# Patient Record
Sex: Female | Born: 1935 | Race: White | Hispanic: No | Marital: Married | Smoking: Former smoker
Health system: Southern US, Community
[De-identification: ages and names within clinical notes are randomized; demographics above are authoritative.]

## PROBLEM LIST (undated history)

## (undated) DIAGNOSIS — C50911 Malignant neoplasm of unspecified site of right female breast: Secondary | ICD-10-CM

## (undated) DIAGNOSIS — I10 Essential (primary) hypertension: Secondary | ICD-10-CM

## (undated) DIAGNOSIS — M549 Dorsalgia, unspecified: Secondary | ICD-10-CM

## (undated) DIAGNOSIS — C801 Malignant (primary) neoplasm, unspecified: Secondary | ICD-10-CM

## (undated) DIAGNOSIS — M1990 Unspecified osteoarthritis, unspecified site: Secondary | ICD-10-CM

## (undated) DIAGNOSIS — C50919 Malignant neoplasm of unspecified site of unspecified female breast: Secondary | ICD-10-CM

## (undated) DIAGNOSIS — G8929 Other chronic pain: Secondary | ICD-10-CM

## (undated) HISTORY — PX: JOINT REPLACEMENT: SHX530

## (undated) HISTORY — DX: Malignant (primary) neoplasm, unspecified: C80.1

## (undated) HISTORY — PX: EYE SURGERY: SHX253

## (undated) HISTORY — PX: SHOULDER SURGERY: SHX246

## (undated) HISTORY — DX: Malignant neoplasm of unspecified site of right female breast: C50.911

## (undated) HISTORY — DX: Malignant neoplasm of unspecified site of unspecified female breast: C50.919

## (undated) HISTORY — DX: Unspecified osteoarthritis, unspecified site: M19.90

## (undated) HISTORY — PX: COLONOSCOPY: SHX174

---

## 1972-04-13 HISTORY — PX: ABDOMINAL HYSTERECTOMY: SHX81

## 1987-04-14 DIAGNOSIS — Z853 Personal history of malignant neoplasm of breast: Secondary | ICD-10-CM | POA: Insufficient documentation

## 1987-04-14 DIAGNOSIS — C801 Malignant (primary) neoplasm, unspecified: Secondary | ICD-10-CM

## 1987-04-14 HISTORY — DX: Malignant (primary) neoplasm, unspecified: C80.1

## 1987-04-14 HISTORY — PX: BREAST SURGERY: SHX581

## 1999-03-17 ENCOUNTER — Encounter: Admission: RE | Admit: 1999-03-17 | Discharge: 1999-03-17 | Payer: Self-pay | Admitting: Family Medicine

## 1999-03-17 ENCOUNTER — Encounter: Payer: Self-pay | Admitting: Family Medicine

## 1999-03-24 ENCOUNTER — Encounter: Admission: RE | Admit: 1999-03-24 | Discharge: 1999-03-24 | Payer: Self-pay | Admitting: Family Medicine

## 1999-03-24 ENCOUNTER — Encounter: Payer: Self-pay | Admitting: Family Medicine

## 1999-09-16 ENCOUNTER — Encounter: Payer: Self-pay | Admitting: Family Medicine

## 1999-09-16 ENCOUNTER — Encounter: Admission: RE | Admit: 1999-09-16 | Discharge: 1999-09-16 | Payer: Self-pay | Admitting: Family Medicine

## 1999-12-02 ENCOUNTER — Ambulatory Visit (HOSPITAL_COMMUNITY): Admission: RE | Admit: 1999-12-02 | Discharge: 1999-12-02 | Payer: Self-pay | Admitting: Gastroenterology

## 1999-12-05 ENCOUNTER — Encounter: Admission: RE | Admit: 1999-12-05 | Discharge: 1999-12-05 | Payer: Self-pay | Admitting: Orthopedic Surgery

## 1999-12-05 ENCOUNTER — Encounter: Payer: Self-pay | Admitting: Orthopedic Surgery

## 1999-12-08 ENCOUNTER — Ambulatory Visit (HOSPITAL_BASED_OUTPATIENT_CLINIC_OR_DEPARTMENT_OTHER): Admission: RE | Admit: 1999-12-08 | Discharge: 1999-12-09 | Payer: Self-pay | Admitting: Orthopedic Surgery

## 2000-04-19 ENCOUNTER — Encounter: Admission: RE | Admit: 2000-04-19 | Discharge: 2000-04-19 | Payer: Self-pay | Admitting: Family Medicine

## 2000-04-19 ENCOUNTER — Encounter: Payer: Self-pay | Admitting: Family Medicine

## 2000-12-23 ENCOUNTER — Encounter: Payer: Self-pay | Admitting: Family Medicine

## 2000-12-23 ENCOUNTER — Encounter: Admission: RE | Admit: 2000-12-23 | Discharge: 2000-12-23 | Payer: Self-pay | Admitting: Family Medicine

## 2000-12-31 ENCOUNTER — Encounter: Admission: RE | Admit: 2000-12-31 | Discharge: 2000-12-31 | Payer: Self-pay | Admitting: Family Medicine

## 2000-12-31 ENCOUNTER — Encounter: Payer: Self-pay | Admitting: Family Medicine

## 2001-01-26 ENCOUNTER — Ambulatory Visit (HOSPITAL_COMMUNITY): Admission: RE | Admit: 2001-01-26 | Discharge: 2001-01-26 | Payer: Self-pay | Admitting: Neurosurgery

## 2001-01-26 ENCOUNTER — Encounter: Payer: Self-pay | Admitting: Neurosurgery

## 2001-02-15 ENCOUNTER — Encounter: Admission: RE | Admit: 2001-02-15 | Discharge: 2001-02-15 | Payer: Self-pay | Admitting: Neurosurgery

## 2001-02-15 ENCOUNTER — Encounter: Payer: Self-pay | Admitting: Neurosurgery

## 2001-03-01 ENCOUNTER — Encounter: Payer: Self-pay | Admitting: Neurosurgery

## 2001-03-01 ENCOUNTER — Encounter: Admission: RE | Admit: 2001-03-01 | Discharge: 2001-03-01 | Payer: Self-pay | Admitting: Neurosurgery

## 2001-03-15 ENCOUNTER — Encounter: Admission: RE | Admit: 2001-03-15 | Discharge: 2001-03-15 | Payer: Self-pay | Admitting: Neurosurgery

## 2001-03-15 ENCOUNTER — Encounter: Payer: Self-pay | Admitting: Neurosurgery

## 2001-07-12 ENCOUNTER — Encounter: Payer: Self-pay | Admitting: Family Medicine

## 2001-07-12 ENCOUNTER — Encounter: Admission: RE | Admit: 2001-07-12 | Discharge: 2001-07-12 | Payer: Self-pay | Admitting: Family Medicine

## 2001-07-22 ENCOUNTER — Encounter: Payer: Self-pay | Admitting: Family Medicine

## 2001-07-22 ENCOUNTER — Encounter: Admission: RE | Admit: 2001-07-22 | Discharge: 2001-07-22 | Payer: Self-pay | Admitting: Family Medicine

## 2002-07-20 ENCOUNTER — Encounter: Payer: Self-pay | Admitting: Family Medicine

## 2002-07-20 ENCOUNTER — Encounter: Admission: RE | Admit: 2002-07-20 | Discharge: 2002-07-20 | Payer: Self-pay | Admitting: Family Medicine

## 2002-11-14 ENCOUNTER — Ambulatory Visit (HOSPITAL_COMMUNITY): Admission: RE | Admit: 2002-11-14 | Discharge: 2002-11-14 | Payer: Self-pay | Admitting: Ophthalmology

## 2002-11-14 ENCOUNTER — Encounter: Payer: Self-pay | Admitting: Ophthalmology

## 2003-07-24 ENCOUNTER — Encounter: Admission: RE | Admit: 2003-07-24 | Discharge: 2003-07-24 | Payer: Self-pay | Admitting: Family Medicine

## 2003-11-14 ENCOUNTER — Inpatient Hospital Stay (HOSPITAL_COMMUNITY): Admission: RE | Admit: 2003-11-14 | Discharge: 2003-11-19 | Payer: Self-pay | Admitting: Orthopedic Surgery

## 2004-08-07 ENCOUNTER — Encounter: Admission: RE | Admit: 2004-08-07 | Discharge: 2004-08-07 | Payer: Self-pay | Admitting: Family Medicine

## 2004-10-27 ENCOUNTER — Inpatient Hospital Stay (HOSPITAL_COMMUNITY): Admission: RE | Admit: 2004-10-27 | Discharge: 2004-10-30 | Payer: Self-pay | Admitting: Orthopedic Surgery

## 2005-09-10 ENCOUNTER — Encounter: Admission: RE | Admit: 2005-09-10 | Discharge: 2005-09-10 | Payer: Self-pay | Admitting: Orthopedic Surgery

## 2006-01-11 ENCOUNTER — Encounter: Admission: RE | Admit: 2006-01-11 | Discharge: 2006-01-11 | Payer: Self-pay | Admitting: Family Medicine

## 2006-03-18 ENCOUNTER — Encounter: Admission: RE | Admit: 2006-03-18 | Discharge: 2006-03-18 | Payer: Self-pay | Admitting: Family Medicine

## 2006-10-08 ENCOUNTER — Encounter: Admission: RE | Admit: 2006-10-08 | Discharge: 2006-10-08 | Payer: Self-pay | Admitting: Family Medicine

## 2007-08-02 ENCOUNTER — Emergency Department (HOSPITAL_COMMUNITY): Admission: EM | Admit: 2007-08-02 | Discharge: 2007-08-02 | Payer: Self-pay | Admitting: Emergency Medicine

## 2007-10-10 ENCOUNTER — Encounter: Admission: RE | Admit: 2007-10-10 | Discharge: 2007-10-10 | Payer: Self-pay | Admitting: Family Medicine

## 2008-09-21 ENCOUNTER — Encounter: Admission: RE | Admit: 2008-09-21 | Discharge: 2008-09-21 | Payer: Self-pay | Admitting: Gastroenterology

## 2008-10-04 ENCOUNTER — Encounter: Payer: Self-pay | Admitting: Gastroenterology

## 2008-10-04 ENCOUNTER — Ambulatory Visit (HOSPITAL_COMMUNITY): Admission: RE | Admit: 2008-10-04 | Discharge: 2008-10-04 | Payer: Self-pay | Admitting: Gastroenterology

## 2008-10-10 ENCOUNTER — Encounter: Admission: RE | Admit: 2008-10-10 | Discharge: 2008-10-10 | Payer: Self-pay | Admitting: Family Medicine

## 2008-10-16 ENCOUNTER — Encounter: Admission: RE | Admit: 2008-10-16 | Discharge: 2008-10-16 | Payer: Self-pay | Admitting: Family Medicine

## 2009-01-11 ENCOUNTER — Encounter: Admission: RE | Admit: 2009-01-11 | Discharge: 2009-01-11 | Payer: Self-pay | Admitting: Family Medicine

## 2009-01-16 ENCOUNTER — Encounter (INDEPENDENT_AMBULATORY_CARE_PROVIDER_SITE_OTHER): Payer: Self-pay | Admitting: Family Medicine

## 2009-01-16 ENCOUNTER — Encounter: Admission: RE | Admit: 2009-01-16 | Discharge: 2009-01-16 | Payer: Self-pay | Admitting: Family Medicine

## 2009-10-11 ENCOUNTER — Encounter: Admission: RE | Admit: 2009-10-11 | Discharge: 2009-10-11 | Payer: Self-pay | Admitting: Family Medicine

## 2010-05-05 ENCOUNTER — Encounter: Payer: Self-pay | Admitting: Gastroenterology

## 2010-06-18 ENCOUNTER — Ambulatory Visit
Admission: RE | Admit: 2010-06-18 | Discharge: 2010-06-18 | Disposition: A | Discharge status: Home / Self Care | Payer: Medicare Other | Source: Ambulatory Visit | Attending: Family Medicine | Admitting: Family Medicine

## 2010-06-18 ENCOUNTER — Other Ambulatory Visit: Payer: Self-pay | Admitting: Family Medicine

## 2010-06-18 DIAGNOSIS — S0990XA Unspecified injury of head, initial encounter: Secondary | ICD-10-CM

## 2010-08-29 NOTE — Op Note (Signed)
[place]. [facility]  Patient:    [name], [name]                      MRN: [id number] Proc. Date: 12/08/99 Adm. Date:  [id number] Attending:  Twana First                           Operative Report  PREOPERATIVE DIAGNOSIS: Right shoulder rotator cuff tear.  POSTOPERATIVE DIAGNOSES:  1. Right shoulder rotator cuff tear.  2. Right shoulder partial labrum tear with impingement.  OPERATION/PROCEDURE:  1. Right shoulder examined under anesthesia followed by arthroscopic     partial labrum tear debridement.  2. Right shoulder open rotator cuff repair using Arthrotec suture anchor.  3. Right shoulder subacromial decompression.  SURGEON: Elana Alm. Thurston Hole, M.D.  ASSISTANT: Kirstin Adelberger, P.A.  ANESTHESIA: General.  OPERATIVE TIME: Operative time was 45 minutes.  COMPLICATIONS: None.  INDICATIONS FOR PROCEDURE: [name] is a 77 year old woman who has had six to eight months of increasing right shoulder pain with signs and symptoms and MRI documenting a rotator cuff tear, who has failed conservative care and is now to undergo arthroscopy and rotator cuff repair.  DESCRIPTION OF PROCEDURE: [name] was brought to the operating room on December 08, 1999 after supraclavicular block had been placed in the holding room.  The patient was placed on the operating table in the supine position and after being placed under general anesthesia her right shoulder was examined under anesthesia.  She had full range of motion.  Her shoulder was stable to ligamentous examination.  She was placed in a beach chair position and her shoulder and arm prepped using sterile Betadine and draped using sterile technique.  Originally through a posterior arthroscopic portal the arthroscope with pump attached was placed and through an anterior portal an arthroscopic probe was placed.  On initial inspection the articular cartilage in the glenohumeral joint was normal.  The  anterior labrum had partial tearing, 20%, and this was debrided.  The superior labrum and biceps tendon anchor was intact.  The biceps tendon was intact.  The inferior labrum and anterior and inferior glenohumeral ligaments were intact.  The posterior labrum was intact.  The rotator cuff was thoroughly inspected with a complete tear of the supraspinatus noted and partial tear of the infraspinatus.  This was partially debrided arthroscopically.  The subacromial space was entered through a lateral arthroscopic portal and subacromial decompression carried out as well CA ligament release.  After this was done and through this lateral portal a 3-4 cm deltoid splitting approach was made.  The underlying subcutaneous tissues were incised in line with the skin incision and the subacromial space entered.  The rotator cuff tear was easily visualized and further debrided sharply back to healthy edges.  An Arthrotec suture anchor was placed in the greater tuberosity trough area and then each of these sutures were placed in mattress suture technique through the rotator cuff and tied down, further reinforced also with #2 Panocryl tied through a small drill hole in the bone, securing the rotator cuff back down to the greater tuberosity.  After this was done the shoulder could be brought through full range of motion with no impingement on the rotator cuff repair noted.  The wound was irrigated and closed using 0 Vicryl, 2-0 Vicryl, and 3-0 Prolene in the subcuticular layers.  Steri-Strips were applied.  Sterile dressings were applied and a  sling.  The patient was awakened and taken to the recovery room in stable condition.  FOLLOW-UP CARE: [name] will be followed overnight at the recovery care center for IV pain control, neurovascular monitoring, and will be discharged tomorrow on Percocet.  She will see me back in the office in a week for suture removal and follow-up. DD:  12/08/99 TD:   12/08/99 Job: [id number]

## 2010-08-29 NOTE — Discharge Summary (Signed)
[name], [name]                         ACCOUNT NO.:  [id number]   MEDICAL RECORD NO.:  [id number]                   PATIENT TYPE:  INP   LOCATION:  5003                                 FACILITY:  [facility]   PHYSICIAN:  Julien Girt, P.A.            DATE OF BIRTH:  [birth year]   DATE OF ADMISSION:  11/14/2003  DATE OF DISCHARGE:  11/19/2003                                 DISCHARGE SUMMARY   ADMISSION DIAGNOSES:  1.  End stage degenerative joint disease, left knee.  2.  History of breast cancer.  3.  History of reflux.   DISCHARGE DIAGNOSES:  1.  End stage degenerative joint disease, left knee.  2.  History of breast cancer.  3.  History of reflux.  4.  Postop blood loss anemia.   HISTORY OF PRESENT ILLNESS:  The patient is a 77 year old white female who  has bilateral end stage DJD of her knees.  Her left one is more painful than  her right.  She has tried conservative care including anti-inflammatories,  pain medications without success.  She has pain at night, pain at rest, pain  unrelieved by anti-inflammatories.  She understands the risks, benefits and  possible complications of the left total knee replacement and is without  question.   PROCEDURES:  On November 14, 2003, the patient underwent a left total knee  replacement by Dr. Thurston Hole.  She tolerated the procedure well.  She had a  femoral nerve block postoperatively.  On postop day #1, her vital signs were  stable.  Her hemoglobin was 9.9.  Her glucose was 160.  She had no other  complaints.  She was up with physical therapy to a chair.  Postop day #2,  patient doing well with pain control.  Temperature 98.8, sodium 128.  Surgical wound was well approximated.  Her hemoglobin as 8.7.  IV was  heparin block.  Her Foley was discontinued.  Her dressing was changed.  Postop day #3, the patient was afebrile.  O2 saturations were 96.  Her  hemoglobin was 8.4.  Her sodium had normalized to 134.  She was  metabolically  stable.  Postop day #4, T-max of 101.  Hemoglobin 8.6.  She  was metabolically stable.  Her INR was 1.7.  She does have some tenderness  at her left calf.  Doppler studies showed no signs of DVT.  She was able to  ambulate 7 steps with physical therapy, but did progress slowly.  Postop day  #5, the patient felt significantly better.  She did have significant  ecchymosis of her knee but minimal drainage.  Surgical wound was well  approximated.   DISPOSITION:  She was discharged to home in stable condition.   DISCHARGE INSTRUCTIONS:  Activity:  Weightbearing as tolerated.  Diet:  Regular.  To use a CPM 0-60 degrees 8 hours a day, increased by 5-10 degrees  a day.  To work on an  elevated left heel on a folded pillow.  To work on  passive extension for 30 minutes a day.  To keep wound clean and dry.  To do  daily dressing changes.  Call with increased temperature, increased redness,  increased drainage or temperature greater than 100.1.   DISCHARGE MEDICATIONS:  1.  Percocet 5/325 1-2 q.4-6h. p.r.n. pain.  2.  Coumadin 4 mg one p.o. q. day.  3.  Evista 60 mg one p.o. daily.  4.  Nexium 40 mg one p.o. daily.  5.  Oxybutynin patch 3.9 mg twice a week.  6.  Colace 100 mg one tablet b.i.d.  7.  Senokot S 2 tablets before dinner.   FOLLOWUP:  She is to follow up with Dr. Thurston Hole on November 27, 2003.                                                Julien Girt, P.A.    KS/MEDQ  D:  12/12/2003  T:  12/12/2003  Job:  [id number]

## 2010-08-29 NOTE — Op Note (Signed)
NAMEGENNY, CAULDER               ACCOUNT NO.:  [id number]   MEDICAL RECORD NO.:  [id number]          PATIENT TYPE:  INP   LOCATION:  2550                         FACILITY:  [facility]   PHYSICIAN:  Robert A. Thurston Hole, M.D. DATE OF BIRTH:  [birth year]   DATE OF PROCEDURE:  10/27/2004  DATE OF DISCHARGE:                                 OPERATIVE REPORT   PREOPERATIVE DIAGNOSIS:  Right knee degenerative joint disease.   POSTOPERATIVE DIAGNOSIS:  Right knee degenerative joint disease.   OPERATION PERFORMED:  Right total knee replacement using Depuy cemented  total knee system with a #3 cemented femur, #3 cemented tibia with 10 mm  polyethylene RP tibial spacer and 38 mm polyethylene cemented patella.   SURGEON:  Elana Alm. Thurston Hole, M.D.   ASSISTANT:  Julien Girt, P.A.   ANESTHESIA:  General.   OPERATIVE TIME:  One hour and 30 minutes.   COMPLICATIONS:  None.   DESCRIPTION OF PROCEDURE:  [name] was brought to the operating room on  October 27, 2004 and placed on the operating table in supine position after a  femoral nerve block had been placed in the holding room by anesthesia for  postoperative pain control.  After being placed under general anesthesia,  she had a Foley catheter placed under sterile conditions and received Ancef  1 g IV preoperatively for prophylaxis.  Her right knee was examined.  She  had range of motion from -7 to 125 degrees with mild varus deformity. Knee  stable to ligamentous exam.  The right leg was prepped using sterile  DuraPrep and draped using sterile technique. The leg was exsanguinated and a  tourniquet elevated 375 mm.  Initially, through a 15 cm longitudinal  incision based over the patella, initial exposure was made.  The underlying  subcutaneous tissues were incised in line with the skin incision.  A median  arthrotomy was performed revealing an excessive amount of normal-appearing  joint fluid.  The articular surfaces were inspected.  She had  grade 4  changes medially, grade 3 changes laterally and grade 3 and 4 changes in the  patellofemoral joint.  The medial and lateral meniscal remnants were removed  as well as the anterior cruciate ligament.  Osteophytes were removed off the  femoral condyles and tibial plateau.  Intramedullary drill was then drilled  up the femoral canal for placement of the distal femoral cutting jig which  was placed in the appropriate amount of rotation and the distal 11 mm cut  was made.  The distal femur was incised.  A  #3 was found to be the  appropriate size.  A #3 cutting jig was placed and then these cuts were  made.  After this was done, the proximal tibia was exposed. The tibial  spines were removed with an oscillating saw.  Intramedullary drill drilled  down the tibial canal for placement of the proximal tibial cutting jig which  was placed in the appropriate amount of rotation and a proximal 8 mm cut was  made off the medial or lower side.  The tibial cut surface was sized.  #  3  was found to be the appropriate size for the tibial baseplate and then  spacer blocks were placed both in flexion and extension with the 10 mm block  and found to give excellent tension in both flexion and extension.  After  this was done, then the keel cut was made for the tibia and the #3 tibial  trial was placed.  The PCL block cutter was then placed on the distal femur  and these cuts were made.  At this point the #3 femoral trial was placed and  with a 10 mm polyethylene RP tibial spacer on the #3 tibial base plate.  The  knee was taken through a full range of motion and found to be stable.  Leg  lengths were equal with excellent correction of her varus deformity and her  flexion deformity.  After this was done, the patella was sized.  The depth  of the patella was 25 mm and a 10 mm resurfacing cut was made and three  locking holes were placed for a 38 mm patella.  The trial was placed.  The  patellofemoral  tracking was evaluated and this was found to be normal.  At  this point it was felt that all the trial components were of excellent size,  fit and stability.  They were then removed.  The knee was then jet lavage  irrigated with 3L of saline solution and then the proximal tibia was exposed  and the #3 tibial baseplate was hammered into position with cement backing  with excess cement being removed from around the edges.  The #3 femoral  component with cement backing was hammered in position also with an  excellent fit with excess cement being removed from around the edges.  The  10 mm polyethylene RP tibial spacer was then placed on the tibial baseplate.  Knee taken through range of motion from 0 to 120 degrees with excellent  stability and excellent correction of her varus and flexion deformity.  The  38 mm polyethylene cement backed patella was then placed into its position  and then held there with a clamp.  After the cement hardened, the  patellofemoral tracking was again evaluated and this was found to be normal.  At this point, it was felt that all of the components were of excellent  size, fit and stability.  The knee was then jet lavage irrigated further  with saline.  Tourniquet was released.  Hemostasis was obtained with  cautery.  The arthrotomy was then closed with #1 Ethibond suture over two  medium Hemovac drains.  Subcutaneous tissues closed with 0 and 2-0 Vicryl.  Skin closed with skin staples.  Sterile dressings were applied.  Hemovac  injected with 0.25% Marcaine with epinephrine.  Sterile dressings and a long  leg splint applied and then the patient awakened and taken to recovery room  in stable condition.  Sponge and needle counts were correct times two at the  end of this case.       RAW/MEDQ  D:  10/27/2004  T:  10/27/2004  Job:  [id number]

## 2010-08-29 NOTE — Discharge Summary (Signed)
NAMEJOSS, [name]               ACCOUNT NO.:  [id number]   MEDICAL RECORD NO.:  [id number]          PATIENT TYPE:  INP   LOCATION:  5006                         FACILITY:  [facility]   PHYSICIAN:  Robert A. Thurston Hole, M.D. DATE OF BIRTH:  January 26, 1926   DATE OF ADMISSION:  10/27/2004  DATE OF DISCHARGE:  10/30/2004                                 DISCHARGE SUMMARY   ADMISSION DIAGNOSES:  1.  End-stage degenerative joint disease, right knee.  2.  History of breast cancer.  3.  Gastroesophageal reflux disease.   DISCHARGE DIAGNOSES:  1.  End-stage degenerative joint disease, right knee.  2.  History of breast cancer.  3.  Gastroesophageal reflux disease.  4.  Postoperative blood loss anemia.   HISTORY OF PRESENT ILLNESS:  The patient is a 77 year old white female who  has had bilateral end-stage DJD of her knees.  She had a left total knee  replacement in the past and tolerated that procedure well.  Now she  understands risks, benefits, and possible complications of a right total  knee replacement and is without questions.   PROCEDURES IN HOUSE:  On October 27, 2004, the patient had a right total knee  by Dr. Thurston Hole.  She tolerated the procedure well, had a femoral nerve block  postoperatively and was admitted for pain control, deep vein thrombosis  prophylaxis, and physical therapy.   HOSPITAL COURSE:  On postop day #1, the patient was doing well.  She had no  complaints. She was afebrile.  Hemoglobin was 9.9.  She was hemodynamically  stable.  INR was 1.2.  Surgical wound was well approximated with moderate  drainage.  PCA was discontinued.  The drain was discontinued.  Her dressing  was changed.  She was started on physical therapy.  She does not tolerate  PERCOCET, so she was started on Viocin for pain with PCA being discontinued.   On postop day #2, the patient was once again vital signs stable.  Her T-max  was 102.  Hemoglobin 9.  She was metabolically stable, neurovascularly  intact.  Surgical wound was well approximated.  Incentive spirometry was  encouraged.   On postop day #3, the patient progressed well.  T-max was 100.1.  Temperature at discharge was 98.5.  Hemoglobin 8.7.  She was beginning to  have a pressure sore on her left heel.  Her right knee wound was well  approximated.  A pressure pad was fit to her left heel, and a Crestor patch  was placed on her left heel.  She was give a Dulcolax suppository and  discharged to home in stable condition, weightbearing as tolerated, on a  regular diet.   DISCHARGE MEDICATIONS:  1.  Norco 7.5/325 one to two q.4-6 h. p.r.n. pain.  2.  Coumadin 5 mg p.o. daily.  3.  Evista 60 mg p.o. daily.  4.  Oxytrol 3.9 mg patch, change twice a week.  5.  Nexium 40 mg 1 tablet a day.  6.  Allegra 60 mg 1 tablet twice daily.  7.  Celebrex 200 mg 1 tablet a day.  8.  Robaxin 500 mg 1 to 2 tablets every 4 to 6 hours as needed.  9.  Colace 100 mg 1 tablet twice daily.  10. Senokot S 2 tablets before dinner.   She has been instructed not to take any aspirin, to use her CPM 0 to 90  degrees 8 hours a day, elevate both heels on a folded pillow for 30 minutes  every morning, continue doing formal physical therapy, use her walker.  She  can shower 2 weeks after surgery.  She can drive 4 weeks after surgery.  She  will follow up in our office in 2 weeks.      Kirstin Shepperson, P.A.      Robert A. Thurston Hole, M.D.  Electronically Signed    KS/MEDQ  D:  02/17/2005  T:  02/17/2005  Job:  [id number]

## 2010-08-29 NOTE — Op Note (Signed)
[name], [name]                         ACCOUNT NO.:  [id number]   MEDICAL RECORD NO.:  [id number]                   PATIENT TYPE:  INP   LOCATION:  2550                                 FACILITY:  [facility]   PHYSICIAN:  Robert A. Thurston Hole, M.D.              DATE OF BIRTH:  06-30-1925   DATE OF PROCEDURE:  11/14/2003  DATE OF DISCHARGE:                                 OPERATIVE REPORT   PREOPERATIVE DIAGNOSIS:  Left knee degenerative joint disease.   POSTOPERATIVE DIAGNOSIS:  Left knee degenerative joint disease.   PROCEDURE:  1. Left total knee replacement using [employer] Scorpio total knee system     with #7 cemented femur, #7 cemented tibia, with 15 mm polyethylene flexed     tibial spacer and 26 mm polyethylene cemented patella.  2. Left knee lateral retinacular release.   SURGEON:  Elana Alm. Thurston Hole, M.D.   ASSISTANT:  Julien Girt, P.A.   ANESTHESIA:  General.   OPERATIVE TIME:  1 hour 30 minutes.   COMPLICATIONS:  None.   DESCRIPTION OF PROCEDURE:  Ms. Happe was brought to the operating room on  November 14, 2003, after a femoral nerve block was placed in the holding room  by anesthesia.  She was placed on the operating table in supine position.  After being placed under general anesthesia, she had a Foley catheter placed  under sterile conditions.  She received Ancef 1 gram IV preoperatively for  prophylaxis.  Her left knee was examined.  Range of motion from -8 to 125  degrees with mild varus deformity and stable ligamentous exam.  The left leg  was then prepped with DuraPrep and draped using sterile technique.  The leg  was exsanguinated and a thigh tourniquet elevated to 375 mmHg.  Initially  through a 15 cm longitudinal incision based over the patella, initial  exposure was made.  The underlying subcutaneous tissues were incised along  the skin incision.  A median arthrotomy was performed revealing an excessive  amount of normal appearing joint fluid.  The  articular surfaces were  inspected.  She had grade 4 changes medially, grade 3 and 4 changes  laterally, and grade 3 and 4 changes in the patellofemoral joint.  The  medial and lateral meniscal remnants were removed as well as the anterior  cruciate ligament.  Osteophytes were removed off the femoral condyles and  tibial plateau.  After this was done, the intramedullary drill was drilled  up the femoral canal for placement of the distal femoral cutting jig which  was placed in the appropriate amount of rotation and a distal 12 mm cut was  made.  The distal femur was then sized.  A #7 was found to be the  appropriate size and the #7 cutting jig was placed and then these cuts were  made.  The proximal tibia was then exposed.  The tibial spines were removed  with an  oscillating saw.  The intramedullary drill drilled down the tibial  canal for placement of the proximal tibial cutting jig which was placed in  the appropriate amount of rotation and a proximal 6 mm cut was made.  After  this was done, then the Scorpio PCL cutter was placed back on the distal  femur and these cuts were made.  At this point, the #7 femoral trial was  placed, the #7 tibial baseplate trial was placed, and with the 15 mm  polyethylene tibial spacer, there was found to be excellent restoration of  normal alignment and excellent stability with range of motion 0 to 125  degrees.  The tibial baseplate was then marked for rotation and the keel cut  was made.  After this was done, the patella was sized.  This was found to be  26 mm and a recessed 10 mm by 26 mm cut was made and three locking holes  were made.  After this was done, it was felt that all the trial components  were of excellent size, fit, and stability.  They were then removed and the  knee was jet lavage irrigated with 3 liters of saline solution.  The  proximal tibia was then exposed and the #7 tibial baseplate with cement  backing was hammered into position  with an excellent fit with excess cement  being removed from around the edges.  The #7 femoral component with cement  backing was hammered into position also with an excellent fit with excess  cement being removed from around the edges.  The 15 mm polyethylene flex  tibial spacer was then locked on the tibial baseplate.  The knee was taken  through a range of motion 0 to 125 degrees with excellent stability and no  lift off on the tray.  The 26 mm polyethylene cement back patella was then  locked into its recess and held over the clamp.  After the cement hardened,  patellofemoral tracking was evaluated.  There was still some significant  lateral tracking and, thus, a lateral retinacular release was carried out  decompressing the patellofemoral joint and improving patellar tracking to  normal.  After this was done, it was felt that all the components were of  excellent size, fit, and stability.  The arthrotomy was closed with #1  Ethibond suture over two medium Hemovac drains.  The subcutaneous tissues  were closed with 0 and 2-0 Vicryl.  The skin was closed with skin staples.  Sterile dressings were applied.  The Hemovac was injected with 0.25%  Marcaine.  Sterile dressings and a long leg splint was applied.  The  tourniquet was released.  The patient was awakened, extubated, and taken to  the recovery room in stable condition.  Needle and sponge counts were  correct x 2 at the end of the case.                                               Robert A. Thurston Hole, M.D.    RAW/MEDQ  D:  11/14/2003  T:  11/14/2003  Job:  [id number]

## 2010-08-29 NOTE — H&P (Signed)
[name], [name]               ACCOUNT NO.:  [id number]   MEDICAL RECORD NO.:  [id number]          PATIENT TYPE:  INP   LOCATION:  NA                           FACILITY:  [facility]   PHYSICIAN:  Robert A. Thurston Hole, M.D. DATE OF BIRTH:  1926/03/10   DATE OF ADMISSION:  10/27/2004  DATE OF DISCHARGE:                                HISTORY & PHYSICAL   ADMISSION DIAGNOSES:  1.  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  2.  History of breast cancer.  3.  Gastrointestinal reflux disease.   HISTORY OF PRESENT ILLNESS:  The patient is a 77 year old white female with  a history of end-stage DJD of both knees.  She had a left total knee  replacement on November 14, 2003, and tolerated this procedure well.  She now  has significant pain unrelieved by conservative care, including anti-  inflammatories and cortisone injection, and elects to have her right total  knee done.  She had her left total knee done on November 14, 2003.  She  understands the risks, benefits and possible complications of surgery and is  without question.   ALLERGIES:  1.  SULFA.  2.  CODEINE.  3.  MORPHINE.   CURRENT MEDICATIONS:  1.  Evista 60 mg one p.o. daily.  2.  Oxytrol patch 3.9 mg every three to four days.  3.  Allegra 60 mg one p.o. b.i.d.  4.  Celebrex 200 mg one p.o. daily.  5.  Nexium 40 mg one p.o. daily.   PAST MEDICAL HISTORY:  Significant for:  1.  Gastrointestinal reflux.  2.  History of breast cancer.   PAST SURGICAL HISTORY:  Significant for:  1.  Hysterectomy.  2.  Lumpectomy.  3.  Right shoulder rotator cuff repair.  4.  Left total knee replacement.   FAMILY HISTORY:  Significant for diabetes and a stroke in her mother and  heart disease, hypertension and cancer in a brother.   REVIEW OF SYSTEMS:  Significant for glasses, constipation and frequent  urination.  No recent episodes of nausea, vomiting, diarrhea, shortness of  breath, heart or chest pain, cough, cold or flu.   SOCIAL HISTORY:  The patient lives with her disabled son, who is her primary  caregiver.   PHYSICAL EXAMINATION:  VITAL SIGNS:  Blood pressure 171/78, pulse 87,  temperature 97.5 degrees, respirations 20.  GENERAL APPEARANCE:  She is a well-nourished, well-developed, 77 year old,  white female in no acute distress.  HEENT:  She is normocephalic and atraumatic.  Extraocular movements are  intact.  Pupils are equally round and reactive to light and accommodation.  NECK:  Supple without bruits.  CHEST:  Clear to auscultation with no rales or rhonchi.  BREASTS:  Not pertinent to current symptomatology and therefore not  examined.  HEART:  She has a normal S1 and S2 with a regular rate and rhythm with no  murmur, rub or gallop.  ABDOMEN:  Soft and nontender with positive bowel sounds.  No organomegaly.  GENITOURINARY:  Not pertinent to current symptomatology, therefore not  examined.  EXTREMITIES:  The right knee has  range of motion -5-110 degrees, 1+  crepitus, 1+ synovitis, varus deformity and 2+ dorsalis pedis pulse.  The  left knee has a well-healed total knee incision with range of motion 0-115  degrees.  SKIN:  Warm and dry.  There are no rashes or abrasions.   X-rays of her right knee show end-stage DJD of the knee.   IMPRESSION:  1.  Right knee end-stage degenerative joint disease.  2.  Gastrointestinal reflux.  3.  History of breast cancer.   PLAN:  On October 27, 2004, the patient is scheduled to undergo a right total  knee replacement by Dr. Thurston Hole.  She will have a femoral nerve block  perioperatively and will be admitted for pain control, DVT prophylaxis and  physical therapy.       KS/MEDQ  D:  10/21/2004  T:  10/21/2004  Job:  [id number]

## 2010-09-02 ENCOUNTER — Other Ambulatory Visit: Payer: Self-pay | Admitting: Family Medicine

## 2010-09-02 DIAGNOSIS — Z1231 Encounter for screening mammogram for malignant neoplasm of breast: Secondary | ICD-10-CM

## 2010-10-13 ENCOUNTER — Ambulatory Visit
Admission: RE | Admit: 2010-10-13 | Discharge: 2010-10-13 | Disposition: A | Discharge status: Home / Self Care | Payer: Medicare Other | Source: Ambulatory Visit | Attending: Family Medicine | Admitting: Family Medicine

## 2010-10-13 DIAGNOSIS — Z1231 Encounter for screening mammogram for malignant neoplasm of breast: Secondary | ICD-10-CM

## 2010-10-17 ENCOUNTER — Other Ambulatory Visit: Payer: Self-pay | Admitting: Family Medicine

## 2010-10-17 DIAGNOSIS — R928 Other abnormal and inconclusive findings on diagnostic imaging of breast: Secondary | ICD-10-CM

## 2010-10-22 ENCOUNTER — Ambulatory Visit
Admission: RE | Admit: 2010-10-22 | Discharge: 2010-10-22 | Disposition: A | Discharge status: Home / Self Care | Payer: Medicare Other | Source: Ambulatory Visit | Attending: Family Medicine | Admitting: Family Medicine

## 2010-10-22 DIAGNOSIS — R928 Other abnormal and inconclusive findings on diagnostic imaging of breast: Secondary | ICD-10-CM

## 2010-12-30 ENCOUNTER — Other Ambulatory Visit: Payer: Self-pay | Admitting: Family Medicine

## 2011-01-06 LAB — COMPREHENSIVE METABOLIC PANEL
AST: 22
Albumin: 3.6
Chloride: 101
Creatinine, Ser: 0.65
GFR calc Af Amer: >60
Potassium: 4.1
Total Bilirubin: 0.8

## 2011-01-06 LAB — URINALYSIS, ROUTINE W REFLEX MICROSCOPIC
Bilirubin Urine: NEGATIVE
Hgb urine dipstick: NEGATIVE
Ketones, ur: NEGATIVE
Protein, ur: NEGATIVE
Urobilinogen, UA: 0.2

## 2011-01-06 LAB — DIFFERENTIAL
Basophils Absolute: 0.1
Basophils Relative: 1
Eosinophils Absolute: 0.1
Eosinophils Relative: 1
Lymphocytes Relative: 14
Monocytes Absolute: 1.4 — ABNORMAL HIGH

## 2011-01-06 LAB — POCT CARDIAC MARKERS: CKMB, poc: 1.1

## 2011-01-06 LAB — CBC: MCHC: 33.8

## 2011-03-18 ENCOUNTER — Other Ambulatory Visit: Payer: Self-pay | Admitting: Family Medicine

## 2011-03-18 DIAGNOSIS — R921 Mammographic calcification found on diagnostic imaging of breast: Secondary | ICD-10-CM

## 2011-04-14 DIAGNOSIS — C50919 Malignant neoplasm of unspecified site of unspecified female breast: Secondary | ICD-10-CM

## 2011-04-14 HISTORY — DX: Malignant neoplasm of unspecified site of unspecified female breast: C50.919

## 2011-04-28 ENCOUNTER — Other Ambulatory Visit: Payer: Self-pay | Admitting: Family Medicine

## 2011-04-28 ENCOUNTER — Ambulatory Visit
Admission: RE | Admit: 2011-04-28 | Discharge: 2011-04-28 | Disposition: A | Discharge status: Home / Self Care | Payer: 59 | Source: Ambulatory Visit | Attending: Family Medicine | Admitting: Family Medicine

## 2011-04-28 DIAGNOSIS — R921 Mammographic calcification found on diagnostic imaging of breast: Secondary | ICD-10-CM

## 2011-05-12 ENCOUNTER — Other Ambulatory Visit (INDEPENDENT_AMBULATORY_CARE_PROVIDER_SITE_OTHER): Payer: Self-pay | Admitting: Surgery

## 2011-05-12 ENCOUNTER — Encounter (INDEPENDENT_AMBULATORY_CARE_PROVIDER_SITE_OTHER): Payer: Self-pay | Admitting: Surgery

## 2011-05-12 ENCOUNTER — Ambulatory Visit (INDEPENDENT_AMBULATORY_CARE_PROVIDER_SITE_OTHER): Payer: Medicare Other | Admitting: Surgery

## 2011-05-12 VITALS — BP 136/60 | HR 70 | Temp 97.6°F | Resp 16 | Ht 63.0 in | Wt 185.2 lb

## 2011-05-12 DIAGNOSIS — Z853 Personal history of malignant neoplasm of breast: Secondary | ICD-10-CM

## 2011-05-12 DIAGNOSIS — R921 Mammographic calcification found on diagnostic imaging of breast: Secondary | ICD-10-CM

## 2011-05-12 DIAGNOSIS — R928 Other abnormal and inconclusive findings on diagnostic imaging of breast: Secondary | ICD-10-CM

## 2011-05-12 NOTE — Patient Instructions (Signed)
We will schedule surgery for removal of the abnormal calcium in the right breast

## 2011-05-12 NOTE — Progress Notes (Signed)
NAME: Shonte H Murin                                                                                      DOB: October 19, 1925 DATE: 05/12/2011               MRN: [id number]   CC:  Chief Complaint  Patient presents with  . Other    Eval of breast density - right    HPI:  [name] is a 78 y.o.  female who was referred  by Dr. for evaluation of Right breast calcifications. In 1989 she had a lumpectomy and followed by radiation therapy for a right breast cancer. We do not have those records. She has an axillary incision so she likely had an axillary dissection. She does not know what stage or type breast cancer she had.  Since then she has had routine mammograms and occasional biopsies but no evidence of any problems. However some calcifications had now developed in the right breast are moderately suspicious. There unable to be biopsied with stereotactic procedure so a excisional biopsy was recommended by the radiologist.  The patient had no breast symptoms at all. She's had no significant problems with the right breast no symptoms on the left.  PMH:  has a past medical history of Arthritis and Cancer (1989).  PSH:   has past surgical history that includes Shoulder surgery; Joint replacement (2004, 2005); and Abdominal hysterectomy (1974).  ALLERGIES:  No Known Allergies  MEDICATIONS: Current outpatient prescriptions:ACIPHEX 20 MG tablet, Daily., Disp: , Rfl: ;  aspirin 81 MG tablet, Take 81 mg by mouth daily., Disp: , Rfl: ;  AZOR 10-40 MG per tablet, Daily., Disp: , Rfl: ;  betamethasone dipropionate (DIPROLENE) 0.05 % cream, Apply topically as needed., Disp: , Rfl: ;  calcium carbonate (OS-CAL) 600 MG TABS, Take 600 mg by mouth daily., Disp: , Rfl:  Cholecalciferol (VITAMIN D3) 1000 UNITS CAPS, Take by mouth daily., Disp: , Rfl: ;  dexlansoprazole (DEXILANT) 60 MG capsule, Take 60 mg by mouth daily., Disp: , Rfl: ;  fexofenadine (ALLEGRA) 60 MG tablet, Take 60 mg by mouth daily., Disp: , Rfl:  ;  gabapentin (NEURONTIN) 100 MG capsule, Daily., Disp: , Rfl: ;  Ginkgo Biloba 40 MG TABS, Take 60 mg by mouth daily., Disp: , Rfl:  hydrochlorothiazide (MICROZIDE) 12.5 MG capsule, Daily., Disp: , Rfl: ;  oxybutynin (OXYTROL) 3.9 MG/24HR, Place 1 patch onto the skin 2 (two) times a week., Disp: , Rfl: ;  oxyCODONE-acetaminophen (PERCOCET) 5-325 MG per tablet, every 6 (six) hours as needed. , Disp: , Rfl: ;  Polyethylene Glycol 3350 (MIRALAX PO), Take by mouth as needed., Disp: , Rfl: ;  simvastatin (ZOCOR) 20 MG tablet, Daily., Disp: , Rfl:  traMADol (ULTRAM) 50 MG tablet, Take 50 mg by mouth every 6 (six) hours as needed., Disp: , Rfl: ;  VITAMIN E PO, Take by mouth daily., Disp: , Rfl:   ROS: Negative except arthritis pains  EXAM:   GENERAL:  The patient is alert, oriented, and generally healthy-appearing, NAD. Mood and affect are normal.  HEENT:  The head is normocephalic, the eyes nonicteric, the  pupils were round regular and equal. EOMs are normal. Pharynx normal. Dentition good.  NECK:  The neck is supple and there are no masses or thyromegaly.  LUNGS: Normal respirations and clear to auscultation.  HEART: Regular rhythm, with no murmurs rubs or gallops. Pulses are intact carotid dorsalis pedis and posterior tibial. No significant varicosities are noted.  BREASTS:  The right breast is smaller than the left. There is some transverse deformity just above the nipple areolar complex as well as a deformity in the upper inner quadrant. To palpation is a marked lack of tissue in the upper inner quadrant. There is no dominant mass. The left breast is normal.  LYMPHATICS: There is no axillary or supraclavicular adenopathy on either side.  ABDOMEN: Soft, flat, and nontender. No masses or organomegaly is noted. No hernias are noted. Bowel sounds are normal.  EXTREMITIES:  Good range of motion, no edema.   DATA REVIEWED: Mammogram reports IMPRESSION:  1. New right breast  calcifications, moderately suspicious with some associated 2. History of right breast cancer stage uncertain  PLAN:   She needs a needle loc excisional biospy. I have discussed the indications for the lumpectomy and described the procedure. She understand that the chance of removal of the abnormal area is very good, but that occasionally we are unable to locate it and may have to do a second procedure. We also discussed the possibility of a second procedure to get additional tissue. Risks of surgery such as bleeding and infection have also been explained, as well as the implications of not doing the surgery. She understands and wishes to proceed.   Meeah Totino J 05/12/2011  CC: , Lupita Raider, MD, MD

## 2011-05-25 ENCOUNTER — Encounter (HOSPITAL_BASED_OUTPATIENT_CLINIC_OR_DEPARTMENT_OTHER): Payer: Self-pay | Admitting: *Deleted

## 2011-05-25 NOTE — Progress Notes (Signed)
To come in for [id number]  

## 2011-05-27 ENCOUNTER — Other Ambulatory Visit: Payer: Self-pay

## 2011-05-27 ENCOUNTER — Encounter (HOSPITAL_BASED_OUTPATIENT_CLINIC_OR_DEPARTMENT_OTHER)
Admission: RE | Admit: 2011-05-27 | Discharge: 2011-05-27 | Disposition: A | Discharge status: Home / Self Care | Payer: Medicare Other | Source: Ambulatory Visit | Attending: Surgery | Admitting: Surgery

## 2011-05-27 DIAGNOSIS — R928 Other abnormal and inconclusive findings on diagnostic imaging of breast: Secondary | ICD-10-CM | POA: Insufficient documentation

## 2011-05-27 DIAGNOSIS — Z853 Personal history of malignant neoplasm of breast: Secondary | ICD-10-CM | POA: Insufficient documentation

## 2011-05-27 LAB — BASIC METABOLIC PANEL
BUN: 13 mg/dL (ref 6–23)
CO2: 28 mEq/L (ref 19–32)
Chloride: 102 mEq/L (ref 96–112)
Glucose, Bld: 125 mg/dL — ABNORMAL HIGH (ref 70–99)
Potassium: 4.3 mEq/L (ref 3.5–5.1)

## 2011-06-01 ENCOUNTER — Other Ambulatory Visit (INDEPENDENT_AMBULATORY_CARE_PROVIDER_SITE_OTHER): Payer: Self-pay | Admitting: Surgery

## 2011-06-01 ENCOUNTER — Ambulatory Visit
Admission: RE | Admit: 2011-06-01 | Discharge: 2011-06-01 | Disposition: A | Discharge status: Home / Self Care | Payer: Medicare Other | Source: Ambulatory Visit | Attending: Surgery | Admitting: Surgery

## 2011-06-01 ENCOUNTER — Ambulatory Visit (HOSPITAL_BASED_OUTPATIENT_CLINIC_OR_DEPARTMENT_OTHER): Admission: RE | Admit: 2011-06-01 | Payer: Medicare Other | Source: Ambulatory Visit | Admitting: Surgery

## 2011-06-01 ENCOUNTER — Encounter (HOSPITAL_BASED_OUTPATIENT_CLINIC_OR_DEPARTMENT_OTHER): Admission: RE | Payer: Self-pay | Source: Ambulatory Visit

## 2011-06-01 DIAGNOSIS — R921 Mammographic calcification found on diagnostic imaging of breast: Secondary | ICD-10-CM

## 2011-06-01 DIAGNOSIS — N6310 Unspecified lump in the right breast, unspecified quadrant: Secondary | ICD-10-CM

## 2011-06-01 HISTORY — DX: Essential (primary) hypertension: I10

## 2011-06-01 HISTORY — DX: Dorsalgia, unspecified: M54.9

## 2011-06-01 HISTORY — DX: Other chronic pain: G89.29

## 2011-06-01 SURGERY — BREAST LUMPECTOMY WITH NEEDLE LOCALIZATION
Anesthesia: General | Laterality: Right

## 2011-06-03 ENCOUNTER — Other Ambulatory Visit (INDEPENDENT_AMBULATORY_CARE_PROVIDER_SITE_OTHER): Payer: Self-pay | Admitting: Surgery

## 2011-06-03 ENCOUNTER — Telehealth (INDEPENDENT_AMBULATORY_CARE_PROVIDER_SITE_OTHER): Payer: Self-pay | Admitting: Surgery

## 2011-06-03 DIAGNOSIS — C50911 Malignant neoplasm of unspecified site of right female breast: Secondary | ICD-10-CM | POA: Insufficient documentation

## 2011-06-03 HISTORY — DX: Malignant neoplasm of unspecified site of right female breast: C50.911

## 2011-06-03 NOTE — Telephone Encounter (Signed)
I called her and reviewed her pathology report with her. She has two areas of cancer in her right breast and had been previously treated for a right breast cancer so she now will need a mastectomy. I discussed that with her and she understands. She is scheduled to see me next Wednesday in the office. I told her I will go ahead and schedule a mastectomy be done sometime after that so we can have the time and date arranged and scheduled. We will go over the surgery in detail when I see her in the office.

## 2011-06-05 ENCOUNTER — Encounter (HOSPITAL_BASED_OUTPATIENT_CLINIC_OR_DEPARTMENT_OTHER): Payer: Self-pay | Admitting: *Deleted

## 2011-06-05 NOTE — Progress Notes (Signed)
surg r/s from rt lumpectomy-to rt mastectomy More labs have been ordered this time since bigger surgwery-she will come in for labs and cxr 2/27 after seeing dr streak again-went over all preop-and rcc-to bring all meds and overnight bag-husband to stay with her rcc

## 2011-06-08 ENCOUNTER — Encounter (INDEPENDENT_AMBULATORY_CARE_PROVIDER_SITE_OTHER): Payer: Self-pay | Admitting: General Surgery

## 2011-06-10 ENCOUNTER — Encounter (HOSPITAL_BASED_OUTPATIENT_CLINIC_OR_DEPARTMENT_OTHER)
Admission: RE | Admit: 2011-06-10 | Discharge: 2011-06-10 | Disposition: A | Discharge status: Home / Self Care | Payer: Medicare Other | Source: Ambulatory Visit | Attending: Surgery | Admitting: Surgery

## 2011-06-10 ENCOUNTER — Encounter (INDEPENDENT_AMBULATORY_CARE_PROVIDER_SITE_OTHER): Payer: Self-pay | Admitting: Surgery

## 2011-06-10 ENCOUNTER — Ambulatory Visit (INDEPENDENT_AMBULATORY_CARE_PROVIDER_SITE_OTHER): Payer: Medicare Other | Admitting: Surgery

## 2011-06-10 ENCOUNTER — Ambulatory Visit
Admission: RE | Admit: 2011-06-10 | Discharge: 2011-06-10 | Disposition: A | Discharge status: Home / Self Care | Payer: Medicare Other | Source: Ambulatory Visit | Attending: Surgery | Admitting: Surgery

## 2011-06-10 VITALS — BP 160/62 | HR 84 | Resp 16 | Ht 63.0 in | Wt 182.0 lb

## 2011-06-10 DIAGNOSIS — C50911 Malignant neoplasm of unspecified site of right female breast: Secondary | ICD-10-CM

## 2011-06-10 DIAGNOSIS — C50919 Malignant neoplasm of unspecified site of unspecified female breast: Secondary | ICD-10-CM

## 2011-06-10 LAB — DIFFERENTIAL
Eosinophils Absolute: 0.1 10*3/uL (ref 0.0–0.7)
Lymphs Abs: 2.7 10*3/uL (ref 0.7–4.0)
Neutrophils Relative %: 62 % (ref 43–77)

## 2011-06-10 LAB — COMPREHENSIVE METABOLIC PANEL
Alkaline Phosphatase: 93 U/L (ref 39–117)
BUN: 14 mg/dL (ref 6–23)
Calcium: 10.1 mg/dL (ref 8.4–10.5)
Creatinine, Ser: 0.87 mg/dL (ref 0.50–1.10)
GFR calc Af Amer: 69 mL/min — ABNORMAL LOW (ref >90)
Glucose, Bld: 107 mg/dL — ABNORMAL HIGH (ref 70–99)
Potassium: 4.3 mEq/L (ref 3.5–5.1)
Total Protein: 7.4 g/dL (ref 6.0–8.3)

## 2011-06-10 LAB — CBC
HCT: 35.7 % — ABNORMAL LOW (ref 36.0–46.0)
Hemoglobin: 11.8 g/dL — ABNORMAL LOW (ref 12.0–15.0)
MCH: 29.4 pg (ref 26.0–34.0)
MCHC: 33.1 g/dL (ref 30.0–36.0)
MCV: 88.8 fL (ref 78.0–100.0)

## 2011-06-10 NOTE — Progress Notes (Signed)
NAME: [name]                                                                                      DOB: 08/11/1925 DATE: 06/10/2011               MRN: [id number]   CC:  Chief Complaint  Patient presents with  . Breast Cancer    HPI:  [name] is a 78 y.o.  female who was referred  by Dr. for evaluation of Right breast calcifications. In 1989 she had a lumpectomy and followed by radiation therapy for a right breast cancer.She had Stage I IDC, Since then she has had routine mammograms and occasional biopsies but no evidence of any problems.An abnormality was recently found and Bx recommended. This showed two foci of IDC, Her v2 neg, ER pending The patient had no breast symptoms at all. She's had no significant problems with the right breast no symptoms on the left.  PMH:  has a past medical history of Arthritis; Cancer (1989); Chronic back pain; Hypertension; and Breast cancer, right breast, recurrent (06/03/2011).  PSH:   has past surgical history that includes Shoulder surgery; Abdominal hysterectomy (1974); Joint replacement (2004, 2005); Eye surgery; Colonoscopy; and Breast surgery (1989).  ALLERGIES:   Allergies  Allergen Reactions  . Morphine And Related Nausea And Vomiting    MEDICATIONS: Current outpatient prescriptions:ACIPHEX 20 MG tablet, Daily., Disp: , Rfl: ;  aspirin 81 MG tablet, Take 81 mg by mouth daily., Disp: , Rfl: ;  AZOR 10-40 MG per tablet, Daily., Disp: , Rfl: ;  betamethasone dipropionate (DIPROLENE) 0.05 % cream, Apply topically as needed., Disp: , Rfl: ;  calcium carbonate (OS-CAL) 600 MG TABS, Take 600 mg by mouth daily., Disp: , Rfl:  Cholecalciferol (VITAMIN D3) 1000 UNITS CAPS, Take by mouth daily., Disp: , Rfl: ;  dexlansoprazole (DEXILANT) 60 MG capsule, Take 60 mg by mouth daily., Disp: , Rfl: ;  fexofenadine (ALLEGRA) 60 MG tablet, Take 60 mg by mouth daily., Disp: , Rfl: ;  gabapentin (NEURONTIN) 100 MG capsule, Daily., Disp: , Rfl: ;  Ginkgo  Biloba 40 MG TABS, Take 60 mg by mouth daily., Disp: , Rfl:  hydrochlorothiazide (MICROZIDE) 12.5 MG capsule, Daily., Disp: , Rfl: ;  oxybutynin (OXYTROL) 3.9 MG/24HR, Place 1 patch onto the skin 2 (two) times a week., Disp: , Rfl: ;  oxyCODONE-acetaminophen (PERCOCET) 5-325 MG per tablet, every 6 (six) hours as needed. , Disp: , Rfl: ;  Polyethylene Glycol 3350 (MIRALAX PO), Take by mouth as needed., Disp: , Rfl: ;  simvastatin (ZOCOR) 20 MG tablet, Daily., Disp: , Rfl:  traMADol (ULTRAM) 50 MG tablet, Take 50 mg by mouth every 6 (six) hours as needed., Disp: , Rfl: ;  VITAMIN E PO, Take by mouth daily., Disp: , Rfl:   ROS: Negative except arthritis pains  EXAM:   GENERAL:  The patient is alert, oriented, and generally healthy-appearing, NAD. Mood and affect are normal.  HEENT:  The head is normocephalic, the eyes nonicteric, the pupils were round regular and equal. EOMs are normal. Pharynx normal. Dentition good.  NECK:  The neck is supple   and there are no masses or thyromegaly.  LUNGS: Normal respirations and clear to auscultation.  HEART: Regular rhythm, with no murmurs rubs or gallops. Pulses are intact carotid dorsalis pedis and posterior tibial. No significant varicosities are noted.  BREASTS:  The right breast is smaller than the left. There is some transverse deformity just above the nipple areolar complex as well as a deformity in the upper inner quadrant. To palpation is a marked lack of tissue in the upper inner quadrant. There is no dominant mass. The left breast is normal.  LYMPHATICS: There is no axillary or supraclavicular adenopathy on either side.  ABDOMEN: Soft, flat, and nontender. No masses or organomegaly is noted. No hernias are noted. Bowel sounds are normal.  EXTREMITIES:  Good range of motion, no edema.   DATA REVIEWED: Mammogram reports Path report - discyussed with pathologist IMPRESSION:  Multifocal Right breast cancer, probably recurrent  PLAN:  I  have explained the pathophysiology and staging of breast cancer with particular attention to her exact situation. We discussed the multidisciplinary approach to breast cancer which often includes both medical and radiation oncology consultations.  We also discussed surgical options for the treatment of breast cancer including lumpectomy and mastectomy with possible reconstructive surgery. In addition we talked about the evaluation and management of lymph nodes including a description of sentinel lymph node biopsy and axillary dissections. We reviewed potential complications and risks including bleeding, infection, numbness,  lymphedema, and the potential need for additional surgery.  She will need mastectomy due to prior cancer and radiation.  We have discussed the likely postoperative course and plans for followup.  I have given the patient some written information that reviewed all of these issues. I believe her questions are answered and that she has a good understanding of the issues.    Jonika Critz J 06/10/2011  CC: , SHAW,KIMBERLEE, MD, MD        

## 2011-06-11 ENCOUNTER — Ambulatory Visit (HOSPITAL_BASED_OUTPATIENT_CLINIC_OR_DEPARTMENT_OTHER)
Admission: RE | Admit: 2011-06-11 | Discharge: 2011-06-12 | Disposition: A | Discharge status: Home / Self Care | Payer: Medicare Other | Source: Ambulatory Visit | Attending: Surgery | Admitting: Surgery

## 2011-06-11 ENCOUNTER — Ambulatory Visit (HOSPITAL_BASED_OUTPATIENT_CLINIC_OR_DEPARTMENT_OTHER): Payer: Medicare Other | Admitting: Certified Registered Nurse Anesthetist

## 2011-06-11 ENCOUNTER — Encounter (HOSPITAL_BASED_OUTPATIENT_CLINIC_OR_DEPARTMENT_OTHER): Payer: Self-pay | Admitting: Certified Registered Nurse Anesthetist

## 2011-06-11 ENCOUNTER — Encounter (HOSPITAL_BASED_OUTPATIENT_CLINIC_OR_DEPARTMENT_OTHER): Payer: Self-pay | Admitting: *Deleted

## 2011-06-11 ENCOUNTER — Other Ambulatory Visit: Payer: Self-pay

## 2011-06-11 ENCOUNTER — Encounter (HOSPITAL_BASED_OUTPATIENT_CLINIC_OR_DEPARTMENT_OTHER)
Admission: RE | Disposition: A | Discharge status: Home / Self Care | Payer: Self-pay | Source: Ambulatory Visit | Attending: Surgery

## 2011-06-11 DIAGNOSIS — G8929 Other chronic pain: Secondary | ICD-10-CM | POA: Insufficient documentation

## 2011-06-11 DIAGNOSIS — M129 Arthropathy, unspecified: Secondary | ICD-10-CM | POA: Insufficient documentation

## 2011-06-11 DIAGNOSIS — D0590 Unspecified type of carcinoma in situ of unspecified breast: Secondary | ICD-10-CM | POA: Insufficient documentation

## 2011-06-11 DIAGNOSIS — C50919 Malignant neoplasm of unspecified site of unspecified female breast: Secondary | ICD-10-CM

## 2011-06-11 DIAGNOSIS — I10 Essential (primary) hypertension: Secondary | ICD-10-CM | POA: Insufficient documentation

## 2011-06-11 DIAGNOSIS — C50911 Malignant neoplasm of unspecified site of right female breast: Secondary | ICD-10-CM

## 2011-06-11 DIAGNOSIS — M549 Dorsalgia, unspecified: Secondary | ICD-10-CM | POA: Insufficient documentation

## 2011-06-11 DIAGNOSIS — Z853 Personal history of malignant neoplasm of breast: Secondary | ICD-10-CM | POA: Insufficient documentation

## 2011-06-11 HISTORY — PX: MASTECTOMY: SHX3

## 2011-06-11 SURGERY — MASTECTOMY, SIMPLE
Anesthesia: General | Site: Chest | Laterality: Right | Wound class: Clean

## 2011-06-11 MED ORDER — FENTANYL CITRATE 0.05 MG/ML IJ SOLN
INTRAMUSCULAR | Status: DC | PRN
  Administered 2011-06-11: 25 ug via INTRAVENOUS
  Administered 2011-06-11 (×2): 50 ug via INTRAVENOUS

## 2011-06-11 MED ORDER — CHLORHEXIDINE GLUCONATE 4 % EX LIQD: 1.0000 "application " | Freq: Once | CUTANEOUS | Status: DC

## 2011-06-11 MED ORDER — ONDANSETRON HCL 4 MG PO TABS: 4.0000 mg | ORAL_TABLET | Freq: Four times a day (QID) | ORAL | Status: DC | PRN

## 2011-06-11 MED ORDER — HYDROMORPHONE HCL PF 1 MG/ML IJ SOLN: 1.0000 mg | INTRAMUSCULAR | Status: DC | PRN

## 2011-06-11 MED ORDER — PANTOPRAZOLE SODIUM 40 MG PO TBEC: 40.0000 mg | DELAYED_RELEASE_TABLET | Freq: Every day | ORAL | Status: DC

## 2011-06-11 MED ORDER — CEFAZOLIN SODIUM 1-5 GM-% IV SOLN: 1.0000 g | INTRAVENOUS | Status: DC

## 2011-06-11 MED ORDER — DEXTROSE IN LACTATED RINGERS 5 % IV SOLN
INTRAVENOUS | Status: DC
  Administered 2011-06-11: 16:00:00 via INTRAVENOUS

## 2011-06-11 MED ORDER — CEFAZOLIN SODIUM-DEXTROSE 2-3 GM-% IV SOLR
2.0000 g | INTRAVENOUS | Status: AC
  Administered 2011-06-11: 2 g via INTRAVENOUS

## 2011-06-11 MED ORDER — ONDANSETRON HCL 4 MG/2ML IJ SOLN: 4.0000 mg | Freq: Four times a day (QID) | INTRAMUSCULAR | Status: DC | PRN

## 2011-06-11 MED ORDER — PROMETHAZINE HCL 25 MG/ML IJ SOLN: 6.2500 mg | INTRAMUSCULAR | Status: DC | PRN

## 2011-06-11 MED ORDER — HYDROCHLOROTHIAZIDE 12.5 MG PO CAPS
12.5000 mg | ORAL_CAPSULE | Freq: Every day | ORAL | Status: DC
Start: 2011-06-12 — End: 2011-06-12

## 2011-06-11 MED ORDER — OXYCODONE-ACETAMINOPHEN 5-325 MG PO TABS: 1.0000 | ORAL_TABLET | Freq: Four times a day (QID) | ORAL | Status: DC | PRN

## 2011-06-11 MED ORDER — ACETAMINOPHEN 10 MG/ML IV SOLN: 1000.0000 mg | Freq: Once | INTRAVENOUS | Status: DC

## 2011-06-11 MED ORDER — OXYBUTYNIN 3.9 MG/24HR TD PTTW: 1.0000 | MEDICATED_PATCH | TRANSDERMAL | Status: DC

## 2011-06-11 MED ORDER — DEXAMETHASONE SODIUM PHOSPHATE 4 MG/ML IJ SOLN
INTRAMUSCULAR | Status: DC | PRN
  Administered 2011-06-11: 10 mg via INTRAVENOUS

## 2011-06-11 MED ORDER — LACTATED RINGERS IV SOLN
INTRAVENOUS | Status: DC
  Administered 2011-06-11 (×2): via INTRAVENOUS
  Administered 2011-06-11: 100 mL/h via INTRAVENOUS

## 2011-06-11 MED ORDER — PROPOFOL 10 MG/ML IV EMUL
INTRAVENOUS | Status: DC | PRN
  Administered 2011-06-11: 150 mg via INTRAVENOUS

## 2011-06-11 MED ORDER — HYDROMORPHONE HCL PF 1 MG/ML IJ SOLN
0.2500 mg | INTRAMUSCULAR | Status: DC | PRN
  Administered 2011-06-11 (×4): 0.25 mg via INTRAVENOUS

## 2011-06-11 MED ORDER — ONDANSETRON HCL 4 MG/2ML IJ SOLN
INTRAMUSCULAR | Status: DC | PRN
  Administered 2011-06-11: 4 mg via INTRAVENOUS

## 2011-06-11 MED ORDER — LIDOCAINE HCL (CARDIAC) 20 MG/ML IV SOLN
INTRAVENOUS | Status: DC | PRN
  Administered 2011-06-11: 60 mg via INTRAVENOUS

## 2011-06-11 MED ORDER — LACTATED RINGERS IV SOLN: INTRAVENOUS | Status: DC

## 2011-06-11 MED ORDER — SIMVASTATIN 20 MG PO TABS: 20.0000 mg | ORAL_TABLET | Freq: Every day | ORAL | Status: DC

## 2011-06-11 MED ORDER — MEPERIDINE HCL 25 MG/ML IJ SOLN: 6.2500 mg | INTRAMUSCULAR | Status: DC | PRN

## 2011-06-11 MED ORDER — CEFAZOLIN SODIUM 1-5 GM-% IV SOLN
1.0000 g | Freq: Four times a day (QID) | INTRAVENOUS | Status: AC
  Administered 2011-06-11 – 2011-06-12 (×3): 1 g via INTRAVENOUS

## 2011-06-11 SURGICAL SUPPLY — 72 items
ADH SKN CLS APL DERMABOND .7 (GAUZE/BANDAGES/DRESSINGS) ×2
APL SKNCLS STERI-STRIP NONHPOA (GAUZE/BANDAGES/DRESSINGS)
APPLIER CLIP 11 MED OPEN (CLIP)
APPLIER CLIP 9.375 MED OPEN (MISCELLANEOUS) ×2
APR CLP MED 11 20 MLT OPN (CLIP)
APR CLP MED 9.3 20 MLT OPN (MISCELLANEOUS) ×1
BANDAGE ELASTIC 6 VELCRO ST LF (GAUZE/BANDAGES/DRESSINGS) ×1 IMPLANT
BENZOIN TINCTURE PRP APPL 2/3 (GAUZE/BANDAGES/DRESSINGS) IMPLANT
BINDER BREAST XLRG (GAUZE/BANDAGES/DRESSINGS) ×1 IMPLANT
BIOPATCH RED 1 DISK 7.0 (GAUZE/BANDAGES/DRESSINGS) ×2 IMPLANT
BLADE HEX COATED 2.75 (ELECTRODE) ×2 IMPLANT
BLADE SURG 10 STRL SS (BLADE) ×2 IMPLANT
BLADE SURG 15 STRL LF DISP TIS (BLADE) ×1 IMPLANT
BLADE SURG 15 STRL SS (BLADE) ×2
BLADE SURG ROTATE 9660 (MISCELLANEOUS) IMPLANT
CANISTER SUCTION 1200CC (MISCELLANEOUS) ×2 IMPLANT
CHLORAPREP W/TINT 26ML (MISCELLANEOUS) ×2 IMPLANT
CLIP APPLIE 11 MED OPEN (CLIP) IMPLANT
CLIP APPLIE 9.375 MED OPEN (MISCELLANEOUS) IMPLANT
CLOTH BEACON ORANGE TIMEOUT ST (SAFETY) ×2 IMPLANT
COVER MAYO STAND STRL (DRAPES) ×2 IMPLANT
COVER PROBE W GEL 5X96 (DRAPES) ×2 IMPLANT
COVER TABLE BACK 60X90 (DRAPES) ×2 IMPLANT
DECANTER SPIKE VIAL GLASS SM (MISCELLANEOUS) IMPLANT
DERMABOND ADVANCED (GAUZE/BANDAGES/DRESSINGS) ×2
DERMABOND ADVANCED .7 DNX12 (GAUZE/BANDAGES/DRESSINGS) ×1 IMPLANT
DRAIN CHANNEL 19F RND (DRAIN) ×2 IMPLANT
DRAIN HEMOVAC 1/8 X 5 (WOUND CARE) IMPLANT
DRAPE LAPAROSCOPIC ABDOMINAL (DRAPES) ×1 IMPLANT
DRAPE U-SHAPE 76X120 STRL (DRAPES) IMPLANT
DRAPE UTILITY XL STRL (DRAPES) ×2 IMPLANT
ELECT BLADE 4.0 EZ CLEAN MEGAD (MISCELLANEOUS) ×2
ELECT REM PT RETURN 9FT ADLT (ELECTROSURGICAL) ×2
ELECTRODE BLDE 4.0 EZ CLN MEGD (MISCELLANEOUS) ×1 IMPLANT
ELECTRODE REM PT RTRN 9FT ADLT (ELECTROSURGICAL) ×1 IMPLANT
EVACUATOR SILICONE 100CC (DRAIN) ×2 IMPLANT
GAUZE SPONGE 4X4 12PLY STRL LF (GAUZE/BANDAGES/DRESSINGS) IMPLANT
GLOVE BIOGEL M 7.0 STRL (GLOVE) ×1 IMPLANT
GLOVE BIOGEL PI IND STRL 7.0 (GLOVE) IMPLANT
GLOVE BIOGEL PI IND STRL 8.5 (GLOVE) IMPLANT
GLOVE BIOGEL PI INDICATOR 7.0 (GLOVE) ×2
GLOVE BIOGEL PI INDICATOR 8.5 (GLOVE) ×1
GLOVE ECLIPSE 7.0 STRL STRAW (GLOVE) ×2 IMPLANT
GLOVE EUDERMIC 7 POWDERFREE (GLOVE) ×2 IMPLANT
GOWN PREVENTION PLUS XLARGE (GOWN DISPOSABLE) ×5 IMPLANT
NDL HYPO 25X1 1.5 SAFETY (NEEDLE) ×2 IMPLANT
NDL SAFETY ECLIPSE 18X1.5 (NEEDLE) ×1 IMPLANT
NEEDLE HYPO 18GX1.5 SHARP (NEEDLE)
NEEDLE HYPO 25X1 1.5 SAFETY (NEEDLE) IMPLANT
NS IRRIG 1000ML POUR BTL (IV SOLUTION) ×2 IMPLANT
PACK BASIN DAY SURGERY FS (CUSTOM PROCEDURE TRAY) ×2 IMPLANT
PEN SKIN MARKING BROAD TIP (MISCELLANEOUS) ×1 IMPLANT
PENCIL BUTTON HOLSTER BLD 10FT (ELECTRODE) ×2 IMPLANT
PIN SAFETY STERILE (MISCELLANEOUS) ×2 IMPLANT
SLEEVE SCD COMPRESS KNEE MED (MISCELLANEOUS) ×2 IMPLANT
SPONGE GAUZE 4X4 12PLY (GAUZE/BANDAGES/DRESSINGS) ×1 IMPLANT
SPONGE LAP 18X18 X RAY DECT (DISPOSABLE) ×2 IMPLANT
SPONGE LAP 4X18 X RAY DECT (DISPOSABLE) IMPLANT
STAPLER VISISTAT 35W (STAPLE) ×2 IMPLANT
STRIP CLOSURE SKIN 1/2X4 (GAUZE/BANDAGES/DRESSINGS) IMPLANT
SUT ETHILON 2 0 FS 18 (SUTURE) IMPLANT
SUT MNCRL AB 4-0 PS2 18 (SUTURE) IMPLANT
SUT SILK 2 0 SH (SUTURE) IMPLANT
SUT VIC AB 3-0 SH 27 (SUTURE) ×2
SUT VIC AB 3-0 SH 27X BRD (SUTURE) ×1 IMPLANT
SUT VICRYL 3-0 CR8 SH (SUTURE) ×2 IMPLANT
SYR CONTROL 10ML LL (SYRINGE) ×2 IMPLANT
TOWEL OR 17X24 6PK STRL BLUE (TOWEL DISPOSABLE) ×3 IMPLANT
TOWEL OR NON WOVEN STRL DISP B (DISPOSABLE) ×2 IMPLANT
TUBE CONNECTING 20X1/4 (TUBING) ×2 IMPLANT
WATER STERILE IRR 1000ML POUR (IV SOLUTION) ×1 IMPLANT
YANKAUER SUCT BULB TIP NO VENT (SUCTIONS) ×2 IMPLANT

## 2011-06-11 NOTE — Discharge Instructions (Addendum)
About my Jackson-Pratt Bulb Drain  What is a Jackson-Pratt bulb? A Jackson-Pratt is a soft, round device used to collect drainage. It is connected to a long, thin drainage catheter, which is held in place by one or two small stiches near your surgical incision site. When the bulb is squeezed, it forms a vacuum, forcing the drainage to empty into the bulb.  Emptying the Jackson-Pratt bulb- To empty the bulb: 1. Release the plug on the top of the bulb. 2. Pour the bulb's contents into a measuring container which your nurse will provide. 3. Record the time emptied and amount of drainage. Empty the drain(s) as often as your     doctor or nurse recommends.  Date                  Time                    Amount (Drain 1)                 Amount (Drain 2)  _____________________________________________________________________  _____________________________________________________________________  _____________________________________________________________________  _____________________________________________________________________  _____________________________________________________________________  _____________________________________________________________________  _____________________________________________________________________  _____________________________________________________________________  Squeezing the Jackson-Pratt Bulb- To squeeze the bulb: 1. Make sure the plug at the top of the bulb is open. 2. Squeeze the bulb tightly in your fist. You will hear air squeezing from the bulb. 3. Replace the plug while the bulb is squeezed. 4. Use a safety pin to attach the bulb to your clothing. This will keep the catheter from     pulling at the bulb insertion site.  When to call your doctor- Call your doctor if:  Drain site becomes red, swollen or hot.  You have a fever greater than 101 degrees F.  There is oozing at the drain site.  Drain falls out (apply a guaze  bandage over the drain hole and secure it with tape).  Drainage increases daily not related to activity patterns. (You will usually have more drainage when you are active than when you are resting.)  Drainage has a bad odor.    Total or Modified Radical Mastectomy  Care After Refer to this sheet in the next few weeks. These instructions provide you with information on caring for yourself after your procedure. Your caregiver may also give you more specific instructions. Your treatment has been planned according to current medical practices, but problems sometimes occur. Call your caregiver if you have any problems or questions after your procedure.  ACTIVITY  Your caregiver will advise you when you may resume strenuous activities, driving, and sports.   After the drain(s) are removed, you may do light housework. Avoid heavy lifting, carrying, or pushing. You should not be lifting anything heavier than 5 lbs.   Take frequent rest periods. You may tire more easily than usual.   Always rest and elevate the arm affected by your surgery for a period of time equal to your activity time.   Continue doing the exercises given to you by the physical therapist/occupational therapist even after full range of motion has returned. The amount of time this takes will vary from person to person.   After normal range of motion has returned, some stiffness and soreness may persist for 2-3 months. This is normal and will subside.   Begin sports or strenuous activities in moderation. This will give you a chance to rebuild your endurance. Continue to be cautious of heavy lifting or carrying (no more than 10 lbs.) with your affected  arm.   You may return to work as recommended by your caregiver.    NUTRITION  You may resume your normal diet.   Make sure you drink plenty of fluids (6-8 glasses a day).   Eat a well-balanced diet. Including daily portions of food from government recommended food groups:     Grains.   Vegetables.   Fruits.   Milk.   Meat & beans.   Oils.  Visit [employer].nl for more information  HYGIENE   You may wash your hair.   If your incision (cut from surgery) is closed, you may shower or tub bathe, unless instructed otherwise by your doctor.    FEVER  If you feel feverish or have shaking chills, take your temperature. If your temperature is 102 F (38.9 C) or above, call your caregiver. The fever may mean there is an infection.   If you call early, infection can be treated with antibiotics and hospitalization may be avoided.    PAIN CONTROL  Mild discomfort may occur.   You may need to take an over-the-counter pain medication or a medication prescribed by your caregiver.   Call your caregiver if you experience increased pain.    INCISION CARE  Check your incision daily for increased redness, drainage, swelling, or separation of skin.   Call your caregiver if any of the above are noted.    ARM AND HAND CARE  If the lymph nodes under your arm were removed with a modified radical mastectomy, there may be a greater tendency for the arm to swell.   Try to avoid having blood pressures taken, blood drawn, or injections given in the affected arm. This is the arm on the same side as the surgery.   Use hand lotion to soften cuticles instead of cutting them to avoid cutting yourself.   Be careful when shaving your under arms. Use an electric shaver if possible. You may use a deodorant after the incision has completely healed. Until then, clean under your arms with hydrogen peroxide.   Use reasonable precaution when cooking, sewing, and gardening to avoid burning or needle or thorn pricks.   Do not weigh your arm straight down with a package or your purse.   Follow the exercises and instructions given to you by the physical therapist/occupational therapist and your caregiver.    FOLLOW-UP APPOINTMENT Call your caregiver for a  follow-up appointment as directed. PROSTHESIS INFORMATION Wear your temporary prosthesis (artificial breast) until your caregiver gives you permission to purchase a permanent one. This will depend upon your rate of healing. We suggest you also wait until you are physically and emotionally ready to shop for one. The suitability depends on several individual factors. We do not endorse any particular prosthesis, but suggest you try several until you are satisfied with appearance and fit. A list of stores may be obtained from your local [employer] at www.cancer.org or 1-800-ACS-2345 (1-(832)460-1074). A permanent prosthesis is medically necessary to restore balance. It is also income tax deductible. Be sure all receipts are marked "surgical". It is not essential to purchase a bra. You may sew a pocket into your regular bra. Note: Remember to take all of your medical insurance information with you when shopping for your prosthesis. SELECTING A PROSTHESIS FITTER You may want to ask the following questions when selecting a fitter:  What styles and brands of forms are carried in stock?   How long have the forms been on the market and have there been  any problems with them?   Why would one form be better than another?   How long should a particular form last?   May I wear the form for a trial period without obligation?   Do the forms require a prosthetic bra? If so, what is the price range? Must I always wear that style?   If alterations to the bra are necessary, can they be done at this location or be sent out?   Will I be charged for alterations?   Will I receive suggestions on how to alter my own wardrobe, if necessary?   Will you special order forms or bras if necessary?   Are fitters always available to meet my needs?   What kinds of garments should be worn for the fitting?   Are lounge wear, swim wear, and accessories available?   If I have insurance coverage or Medicare,  will you suggest ways for processing the paper work?   Do you keep complete records so that mail reordering is possible?   How are warranty claims handled if I have a problem with the form?  Document Released: 11/21/2003 Document Revised: 12/10/2010 Document Reviewed: 07/26/2007 [employer] [facility] [employer] Patient Information 2012 [place], [place].  [facility]  [address] [place], [place] [address] [phone]   Post Anesthesia Home Care Instructions  Activity: Get plenty of rest for the remainder of the day. A responsible adult should stay with you for 24 hours following the procedure.  For the next 24 hours, DO NOT: -Drive a car -Advertising copywriter -Drink alcoholic beverages -Take any medication unless instructed by your physician -Make any legal decisions or sign important papers.  Meals: Start with liquid foods such as gelatin or soup. Progress to regular foods as tolerated. Avoid greasy, spicy, heavy foods. If nausea and/or vomiting occur, drink only clear liquids until the nausea and/or vomiting subsides. Call your physician if vomiting continues.  Special Instructions/Symptoms: Your throat may feel dry or sore from the anesthesia or the breathing tube placed in your throat during surgery. If this causes discomfort, gargle with warm salt water. The discomfort should disappear within 24 hours.

## 2011-06-11 NOTE — Anesthesia Postprocedure Evaluation (Signed)
Anesthesia Post Note  Patient: [name]  Procedure(s) Performed: Procedure(s) (LRB): TOTAL MASTECTOMY (Right)  Anesthesia type: General  Patient location: PACU  Post pain: Pain level controlled  Post assessment: Patient's Cardiovascular Status Stable  Last Vitals:  Filed Vitals:   06/11/11 1430  BP: 138/59  Pulse: 81  Temp:   Resp: 16    Post vital signs: Reviewed and stable  Level of consciousness: alert  Complications: No apparent anesthesia complications

## 2011-06-11 NOTE — H&P (View-Only) (Signed)
NAME: [name]                                                                                      DOB: 06/22/1925 DATE: 06/10/2011               MRN: [id number]   CC:  Chief Complaint  Patient presents with  . Breast Cancer    HPI:  [name] is a 78 y.o.  female who was referred  by Dr. for evaluation of Right breast calcifications. In 1989 she had a lumpectomy and followed by radiation therapy for a right breast cancer.She had Stage I IDC, Since then she has had routine mammograms and occasional biopsies but no evidence of any problems.An abnormality was recently found and Bx recommended. This showed two foci of IDC, Her v2 neg, ER pending The patient had no breast symptoms at all. She's had no significant problems with the right breast no symptoms on the left.  PMH:  has a past medical history of Arthritis; Cancer (1989); Chronic back pain; Hypertension; and Breast cancer, right breast, recurrent (06/03/2011).  PSH:   has past surgical history that includes Shoulder surgery; Abdominal hysterectomy (1974); Joint replacement (2004, 2005); Eye surgery; Colonoscopy; and Breast surgery (1989).  ALLERGIES:   Allergies  Allergen Reactions  . Morphine And Related Nausea And Vomiting    MEDICATIONS: Current outpatient prescriptions:ACIPHEX 20 MG tablet, Daily., Disp: , Rfl: ;  aspirin 81 MG tablet, Take 81 mg by mouth daily., Disp: , Rfl: ;  AZOR 10-40 MG per tablet, Daily., Disp: , Rfl: ;  betamethasone dipropionate (DIPROLENE) 0.05 % cream, Apply topically as needed., Disp: , Rfl: ;  calcium carbonate (OS-CAL) 600 MG TABS, Take 600 mg by mouth daily., Disp: , Rfl:  Cholecalciferol (VITAMIN D3) 1000 UNITS CAPS, Take by mouth daily., Disp: , Rfl: ;  dexlansoprazole (DEXILANT) 60 MG capsule, Take 60 mg by mouth daily., Disp: , Rfl: ;  fexofenadine (ALLEGRA) 60 MG tablet, Take 60 mg by mouth daily., Disp: , Rfl: ;  gabapentin (NEURONTIN) 100 MG capsule, Daily., Disp: , Rfl: ;  Ginkgo  Biloba 40 MG TABS, Take 60 mg by mouth daily., Disp: , Rfl:  hydrochlorothiazide (MICROZIDE) 12.5 MG capsule, Daily., Disp: , Rfl: ;  oxybutynin (OXYTROL) 3.9 MG/24HR, Place 1 patch onto the skin 2 (two) times a week., Disp: , Rfl: ;  oxyCODONE-acetaminophen (PERCOCET) 5-325 MG per tablet, every 6 (six) hours as needed. , Disp: , Rfl: ;  Polyethylene Glycol 3350 (MIRALAX PO), Take by mouth as needed., Disp: , Rfl: ;  simvastatin (ZOCOR) 20 MG tablet, Daily., Disp: , Rfl:  traMADol (ULTRAM) 50 MG tablet, Take 50 mg by mouth every 6 (six) hours as needed., Disp: , Rfl: ;  VITAMIN E PO, Take by mouth daily., Disp: , Rfl:   ROS: Negative except arthritis pains  EXAM:   GENERAL:  The patient is alert, oriented, and generally healthy-appearing, NAD. Mood and affect are normal.  HEENT:  The head is normocephalic, the eyes nonicteric, the pupils were round regular and equal. EOMs are normal. Pharynx normal. Dentition good.  NECK:  The neck is supple  and there are no masses or thyromegaly.  LUNGS: Normal respirations and clear to auscultation.  HEART: Regular rhythm, with no murmurs rubs or gallops. Pulses are intact carotid dorsalis pedis and posterior tibial. No significant varicosities are noted.  BREASTS:  The right breast is smaller than the left. There is some transverse deformity just above the nipple areolar complex as well as a deformity in the upper inner quadrant. To palpation is a marked lack of tissue in the upper inner quadrant. There is no dominant mass. The left breast is normal.  LYMPHATICS: There is no axillary or supraclavicular adenopathy on either side.  ABDOMEN: Soft, flat, and nontender. No masses or organomegaly is noted. No hernias are noted. Bowel sounds are normal.  EXTREMITIES:  Good range of motion, no edema.   DATA REVIEWED: Mammogram reports Path report - discyussed with pathologist IMPRESSION:  Multifocal Right breast cancer, probably recurrent  PLAN:  I  have explained the pathophysiology and staging of breast cancer with particular attention to her exact situation. We discussed the multidisciplinary approach to breast cancer which often includes both medical and radiation oncology consultations.  We also discussed surgical options for the treatment of breast cancer including lumpectomy and mastectomy with possible reconstructive surgery. In addition we talked about the evaluation and management of lymph nodes including a description of sentinel lymph node biopsy and axillary dissections. We reviewed potential complications and risks including bleeding, infection, numbness,  lymphedema, and the potential need for additional surgery.  She will need mastectomy due to prior cancer and radiation.  We have discussed the likely postoperative course and plans for followup.  I have given the patient some written information that reviewed all of these issues. I believe her questions are answered and that she has a good understanding of the issues.    [name] J 06/10/2011  CC: , Lupita Raider, MD, MD

## 2011-06-11 NOTE — Transfer of Care (Signed)
Immediate Anesthesia Transfer of Care Note  Patient: [name]  Procedure(s) Performed: Procedure(s) (LRB): TOTAL MASTECTOMY (Right)  Patient Location: PACU  Anesthesia Type: General  Level of Consciousness: awake, alert , oriented and patient cooperative  Airway & Oxygen Therapy: Patient Spontanous Breathing and Patient connected to face mask oxygen  Post-op Assessment: Report given to PACU RN and Post -op Vital signs reviewed and stable  Post vital signs: Reviewed and stable  Complications: No apparent anesthesia complications

## 2011-06-11 NOTE — Anesthesia Preprocedure Evaluation (Signed)
Anesthesia Evaluation  Patient identified by MRN, date of birth, ID band Patient awake    Reviewed: Allergy & Precautions, H&P , NPO status , Patient's Chart, lab work & pertinent test results  History of Anesthesia Complications Negative for: history of anesthetic complications  Airway Mallampati: I  Neck ROM: Full    Dental  (+) Teeth Intact   Pulmonary neg pulmonary ROS,  clear to auscultation        Cardiovascular hypertension, Regular Normal    Neuro/Psych Negative Neurological ROS     GI/Hepatic negative GI ROS, Neg liver ROS,   Endo/Other  Negative Endocrine ROS  Renal/GU      Musculoskeletal   Abdominal   Peds  Hematology negative hematology ROS (+)   Anesthesia Other Findings   Reproductive/Obstetrics                           Anesthesia Physical Anesthesia Plan  ASA: II  Anesthesia Plan: General   Post-op Pain Management:    Induction: Intravenous  Airway Management Planned: LMA  Additional Equipment:   Intra-op Plan:   Post-operative Plan: Extubation in OR  Informed Consent:   Dental advisory given  Plan Discussed with: CRNA and Surgeon  Anesthesia Plan Comments:         Anesthesia Quick Evaluation

## 2011-06-11 NOTE — Progress Notes (Signed)
PACU followup: called re chest pain in Pacu, 12 lead EKG obtained, unchanged from previous. After examining patient I feel this pain is more consistent w surgical chest wall pain than cardiac. Will continue to observe in Pacu for now.

## 2011-06-11 NOTE — Interval H&P Note (Signed)
History and Physical Interval Note:  06/11/2011 10:58 AM  [name]  has presented today for surgery, with the diagnosis of right breast cancer  The various methods of treatment have been discussed with the patient and family. After consideration of risks, benefits and other options for treatment, the patient has consented to  Procedure(s) (LRB): TOTAL MASTECTOMY (Right) as a surgical intervention .  The patients' history has been reviewed, patient examined, no change in status, stable for surgery.  I have reviewed the patients' chart and labs.  Questions were answered to the patient's satisfaction.     Signe Tackitt J

## 2011-06-11 NOTE — Anesthesia Procedure Notes (Signed)
Procedure Name: LMA Insertion Date/Time: 06/11/2011 11:28 AM Performed by: Hidaya Daniel D Pre-anesthesia Checklist: Patient identified, Emergency Drugs available, Suction available and Patient being monitored Patient Re-evaluated:Patient Re-evaluated prior to inductionOxygen Delivery Method: Circle System Utilized Preoxygenation: Pre-oxygenation with 100% oxygen Intubation Type: IV induction Ventilation: Mask ventilation without difficulty LMA: LMA inserted LMA Size: 4.0 Number of attempts: 1 Placement Confirmation: positive ETCO2 Tube secured with: Tape Dental Injury: Teeth and Oropharynx as per pre-operative assessment

## 2011-06-11 NOTE — Progress Notes (Signed)
  The patient was having some pain in the postoperative period. Initially it was reported as some chest pain but it was actually some right arm pain extending down from her axilla. It has now improved. An EKG was negative.  Objective: Vital signs in last 24 hours: Temp:  [97 F (36.1 C)-97.8 F (36.6 C)] 97 F (36.1 C) (02/28 1500) Pulse Rate:  [64-86] 86  (02/28 1500) Resp:  [12-20] 15  (02/28 1500) BP: (112-145)/(45-88) 145/60 mmHg (02/28 1500) SpO2:  [94 %-100 %] 98 % (02/28 1500) Last BM Date: 07/09/11  Intake/Output from previous day:   Intake/Output this shift: Total I/O In: 1530 [I.V.:1500; Other:30] Out: 300 [Urine:300]  General appearance: alert and no distress the dressing is dry. There does not appear to be a hematoma. JP is becoming thin.  Lab Results:  No results found for this or any previous visit (from the past 24 hour(s)).   Studies/Results Radiology     MEDS, Scheduled    .  ceFAZolin (ANCEF) IV  1 g Intravenous Q6H  .  ceFAZolin (ANCEF) IV  2 g Intravenous 60 min Pre-Op  . hydrochlorothiazide  12.5 mg Oral Daily  . oxybutynin  1 patch Transdermal 2 times weekly  . pantoprazole  40 mg Oral Q1200  . simvastatin  20 mg Oral q1800  . DISCONTD: acetaminophen  1,000 mg Intravenous Once  . DISCONTD:  ceFAZolin (ANCEF) IV  1 g Intravenous 60 min Pre-Op  . DISCONTD:  ceFAZolin (ANCEF) IV  1 g Intravenous 60 min Pre-Op  . DISCONTD: chlorhexidine  1 application Topical Once  . DISCONTD: chlorhexidine  1 application Topical Once  . DISCONTD: chlorhexidine  1 application Topical Once  . DISCONTD: chlorhexidine  1 application Topical Once     Assessment: <principal problem not specified> Stable exam postoperatively. I suspect that her pain may be from some traction on the shoulder area. There does not appear to be any other problem currently.  Plan:  Continue with normal postoperative care.  LOS: 0 days    Currie Paris, MD, [facility], [place] [phone]   06/11/2011 4:04 PM

## 2011-06-11 NOTE — Op Note (Signed)
[name] 1926/02/22 [id number] 06/04/2011  Preoperative diagnosis: Multifocal right breast cancer clinical stage I  Postoperative diagnosis: Same  Procedure: Right total mastectomy  Surgeon: Currie Paris, MD, FACS  Assistant: None  Anesthesia: General   Clinical History and Indications: This patient had a prior lumpectomy radiation and axillary dissection for an invasive carcinoma over 20 years ago. She has developed 2 foci of breast cancer in the same breast. After discussion with the patient she has elected to proceed to total mastectomy.    Description of Procedure: Saw the patient in the holding area and we reviewed the plans for the procedure. I marked the right breast as the operative site.  The patient was taken to the operating room and after satisfactory general endotracheal anesthesia had been obtained the right breast was prepped and draped. A timeout was done. I made an elliptical incision in the usual fashion transversely oriented. I raised the usual skin flaps to the clavicle, sternum, inframammary fold, and latissimus. The breast was then removed from the muscle with cautery. The axillary area was palpated but there is no adenopathy noted and she has had a prior axillary dissection.  I irrigated to make sure everything was dry. I placed a 19 Blake drain separate stab wound and secured it with a 2-0 nylon suture. I irrigated again and everything has remained dry. The incision was closed with interrupted 3-0 Vicryl, running 4-0 Monocryl subcuticular, and Dermabond. Dressings and a breast binder were placed.  The patient tolerated the procedure well. Counts were correct. Blood loss was minimal.  Currie Paris, MD, FACS 06/11/2011 12:24 PM

## 2011-06-11 NOTE — Progress Notes (Signed)
Patient complained of "elephant sitting on my chest", pain in right shoulder and pain running on the inside of right arm to wrist. Dr. Jacklynn Bue notified and EKG ran. No changes noted per Dr. Jacklynn Bue. Binder loosened and dressing modified without compromising sterility. Patient states some relief but still some tingling down inner right arm. Pulses and color good. Dr. Dwain Sarna in to see patient also.

## 2011-06-12 MED ORDER — HYDROCODONE-ACETAMINOPHEN 5-325 MG PO TABS: 1.0000 | ORAL_TABLET | ORAL | Status: DC | PRN

## 2011-06-12 NOTE — Progress Notes (Signed)
1 Day Post-Op  Subjective: Feels good and anxious to go home. Arm pain resolved. Tolerating diet  Objective: Vital signs in last 24 hours: Temp:  [97 F (36.1 C)-98 F (36.7 C)] 98 F (36.7 C) (03/01 0500) Pulse Rate:  [64-89] 87  (03/01 0500) Resp:  [12-20] 18  (03/01 0500) BP: (112-156)/(45-88) 156/70 mmHg (03/01 0500) SpO2:  [90 %-100 %] 99 % (03/01 0500)   Intake/Output from previous day: 02/28 0701 - 03/01 0700 In: 3813.8 [P.O.:1270; I.V.:2488.8] Out: 2215 [Urine:2200; Drains:15] Intake/Output this shift:     General appearance: alert and no distress Resp: clear to auscultation bilaterally  Incision: Wound clean and JP thin  Lab Results:   Basename 06/10/11 1030  WBC 9.9  HGB 11.8*  HCT 35.7*  PLT 320   BMET  Basename 06/10/11 1030  NA 139  K 4.3  CL 103  CO2 25  GLUCOSE 107*  BUN 14  CREATININE 0.87  CALCIUM 10.1   PT/INR No results found for this basename: LABPROT:2,INR:2 in the last 72 hours ABG No results found for this basename: PHART:2,PCO2:2,PO2:2,HCO3:2 in the last 72 hours  MEDS, Scheduled    .  ceFAZolin (ANCEF) IV  1 g Intravenous Q6H  .  ceFAZolin (ANCEF) IV  2 g Intravenous 60 min Pre-Op  . hydrochlorothiazide  12.5 mg Oral Daily  . oxybutynin  1 patch Transdermal 2 times weekly  . pantoprazole  40 mg Oral Q1200  . simvastatin  20 mg Oral q1800  . DISCONTD: acetaminophen  1,000 mg Intravenous Once  . DISCONTD:  ceFAZolin (ANCEF) IV  1 g Intravenous 60 min Pre-Op  . DISCONTD:  ceFAZolin (ANCEF) IV  1 g Intravenous 60 min Pre-Op  . DISCONTD: chlorhexidine  1 application Topical Once  . DISCONTD: chlorhexidine  1 application Topical Once  . DISCONTD: chlorhexidine  1 application Topical Once  . DISCONTD: chlorhexidine  1 application Topical Once    Studies/Results: Chest 2 View  06/10/2011  *RADIOLOGY REPORT*  Clinical Data: Preoperative respiratory examination.  Breast cancer.  CHEST - 2 VIEW  Comparison: 11/08/2003 and  08/02/2007 radiographs.  Findings: There is a small hiatal hernia.  Heart size and mediastinal contours are otherwise stable.  The basilar aeration has improved.  There is stable biapical scarring.  The lungs are otherwise clear.  There is no pleural effusion.  Postsurgical changes are present within the right breast and right axilla. There are thoracolumbar degenerative changes associated with a mild scoliosis.  IMPRESSION: No acute cardiopulmonary process or evidence of metastatic disease. Small hiatal hernia.  Original Report Authenticated By: Gerrianne Scale, M.D.    Assessment: s/p Procedure(s): TOTAL MASTECTOMY Doing well and ready to go  Plan: Discharge   LOS: 1 day     Currie Paris, MD, [facility], [place] [phone]   06/12/2011 7:15 AM

## 2011-06-19 ENCOUNTER — Other Ambulatory Visit (INDEPENDENT_AMBULATORY_CARE_PROVIDER_SITE_OTHER): Payer: Self-pay | Admitting: Surgery

## 2011-06-19 ENCOUNTER — Encounter (INDEPENDENT_AMBULATORY_CARE_PROVIDER_SITE_OTHER): Payer: Self-pay | Admitting: Surgery

## 2011-06-19 ENCOUNTER — Ambulatory Visit (INDEPENDENT_AMBULATORY_CARE_PROVIDER_SITE_OTHER): Payer: Medicare Other | Admitting: Surgery

## 2011-06-19 VITALS — BP 126/54 | HR 84 | Temp 98.2°F | Resp 20 | Ht 63.0 in | Wt 186.6 lb

## 2011-06-19 DIAGNOSIS — C50919 Malignant neoplasm of unspecified site of unspecified female breast: Secondary | ICD-10-CM

## 2011-06-19 DIAGNOSIS — Z9889 Other specified postprocedural states: Secondary | ICD-10-CM

## 2011-06-19 DIAGNOSIS — C50911 Malignant neoplasm of unspecified site of right female breast: Secondary | ICD-10-CM

## 2011-06-19 DIAGNOSIS — Z853 Personal history of malignant neoplasm of breast: Secondary | ICD-10-CM

## 2011-06-19 DIAGNOSIS — C50912 Malignant neoplasm of unspecified site of left female breast: Secondary | ICD-10-CM

## 2011-06-19 NOTE — Progress Notes (Signed)
[name]    [id number] 06/19/2011    11/24/1925   CC: Post op Mastectomy  HPI: The patient returns for post op follow-up. She underwent a right TM on 06/11/11. Over all she feels that she is doing well. No particular problems. Drain has slowed  PE: The incision is healing nicely and there is no evidence of infection or hematoma.  The drain is clear.  DATA REVIEWED: Pathology report showed two foci IDC, receptor +  IMPRESSION: Patient doing well.   PLAN: Her next visit will be in one week. Drain removed. Path discussed with patient and given copy. Oncology consult placed into Epic.

## 2011-06-19 NOTE — Patient Instructions (Signed)
We have requested an appointment with an oncologist to talk to you about taking an antiestrogen pill  See me again next weeek

## 2011-06-20 ENCOUNTER — Encounter (INDEPENDENT_AMBULATORY_CARE_PROVIDER_SITE_OTHER): Payer: Self-pay | Admitting: Surgery

## 2011-06-22 ENCOUNTER — Other Ambulatory Visit: Payer: Self-pay | Admitting: *Deleted

## 2011-06-22 ENCOUNTER — Telehealth: Payer: Self-pay | Admitting: *Deleted

## 2011-06-22 ENCOUNTER — Telehealth: Payer: Self-pay | Admitting: Oncology

## 2011-06-22 DIAGNOSIS — C50919 Malignant neoplasm of unspecified site of unspecified female breast: Secondary | ICD-10-CM

## 2011-06-22 NOTE — Telephone Encounter (Signed)
Added new pt appt for 3/12 @ 9:30 am per [facility]). Per [facility] pt aware.

## 2011-06-22 NOTE — Telephone Encounter (Signed)
Confirmed new breast patient appointment for 06/23/11.  Contact information given.

## 2011-06-23 ENCOUNTER — Ambulatory Visit (HOSPITAL_BASED_OUTPATIENT_CLINIC_OR_DEPARTMENT_OTHER): Payer: Medicare Other | Admitting: Oncology

## 2011-06-23 ENCOUNTER — Encounter (INDEPENDENT_AMBULATORY_CARE_PROVIDER_SITE_OTHER): Payer: Self-pay | Admitting: Surgery

## 2011-06-23 ENCOUNTER — Other Ambulatory Visit (HOSPITAL_BASED_OUTPATIENT_CLINIC_OR_DEPARTMENT_OTHER): Payer: Medicare Other | Admitting: Lab

## 2011-06-23 ENCOUNTER — Ambulatory Visit (INDEPENDENT_AMBULATORY_CARE_PROVIDER_SITE_OTHER): Payer: Medicare Other | Admitting: Surgery

## 2011-06-23 ENCOUNTER — Ambulatory Visit: Payer: Medicare Other

## 2011-06-23 ENCOUNTER — Telehealth: Payer: Self-pay | Admitting: *Deleted

## 2011-06-23 VITALS — BP 133/48 | HR 88 | Temp 97.6°F | Ht 63.0 in | Wt 189.4 lb

## 2011-06-23 VITALS — BP 128/63 | HR 81 | Temp 98.2°F | Ht 63.0 in | Wt 188.7 lb

## 2011-06-23 DIAGNOSIS — Z853 Personal history of malignant neoplasm of breast: Secondary | ICD-10-CM

## 2011-06-23 DIAGNOSIS — C50919 Malignant neoplasm of unspecified site of unspecified female breast: Secondary | ICD-10-CM

## 2011-06-23 DIAGNOSIS — C50911 Malignant neoplasm of unspecified site of right female breast: Secondary | ICD-10-CM

## 2011-06-23 LAB — COMPREHENSIVE METABOLIC PANEL
Albumin: 4.1 g/dL (ref 3.5–5.2)
BUN: 13 mg/dL (ref 6–23)
CO2: 25 mEq/L (ref 19–32)
Calcium: 9.5 mg/dL (ref 8.4–10.5)
Chloride: 105 mEq/L (ref 96–112)
Glucose, Bld: 111 mg/dL — ABNORMAL HIGH (ref 70–99)
Potassium: 4.4 mEq/L (ref 3.5–5.3)
Sodium: 139 mEq/L (ref 135–145)
Total Protein: 6.7 g/dL (ref 6.0–8.3)

## 2011-06-23 LAB — CBC WITH DIFFERENTIAL/PLATELET
Basophils Absolute: 0 10*3/uL (ref 0.0–0.1)
Eosinophils Absolute: 0.3 10*3/uL (ref 0.0–0.5)
HGB: 11.3 g/dL — ABNORMAL LOW (ref 11.6–15.9)
MCV: 89.7 fL (ref 79.5–101.0)
MONO#: 0.9 10*3/uL (ref 0.1–0.9)
MONO%: 8.6 % (ref 0.0–14.0)
NEUT#: 6.8 10*3/uL — ABNORMAL HIGH (ref 1.5–6.5)
RBC: 3.74 10*6/uL (ref 3.70–5.45)
RDW: 13.1 % (ref 11.2–14.5)
WBC: 10 10*3/uL (ref 3.9–10.3)
lymph#: 2.1 10*3/uL (ref 0.9–3.3)

## 2011-06-23 MED ORDER — CEPHALEXIN 500 MG PO CAPS: 500.0000 mg | ORAL_CAPSULE | Freq: Three times a day (TID) | ORAL | Status: AC

## 2011-06-23 MED ORDER — ANASTROZOLE 1 MG PO TABS: 1.0000 mg | ORAL_TABLET | Freq: Every day | ORAL | Status: AC

## 2011-06-23 NOTE — Progress Notes (Signed)
Referral MD Dr Pincus Badder; Dr Ivor Reining Reason for Referral: Breast Cancer   Chief Complaint  Patient presents with  . Follow-up    breast cancer  : This is a delightful 78 year old woman presents with recurrence of breast cancer in the right breast. She is undergone serial mammography and has had multiple biopsies and ultrasounds in the past because of abnormal areas. On 04/27/2001 she had a mammogram which showed developing density in the central portion of the right breast was loosely grouped microcalcifications. No discrete mass was identified the palpable thickening was seen throughout the medial right breast. The patient she did undergo biopsies of 2 different areas on 06/01/2011 lesions identified at the at the 6:00 position showed invasive mammary carcinoma present her ER and PR positive and 192% respectively, HER-2 was nonamplified and proliferative index of 30%. The patient underwent a mastectomy on 06/11/2011 and 2 different areas of invasive cancer identified both of which are gray to. What was 1.5 cm and the second was 0.4 cm associated low grade DCIS. Margins were negative and lymphovascular invasion was not identified. No lymph nodes were removed and the patient had a fairly unremarkable postoperative course   HPI:   Past Medical History  Diagnosis Date  . Arthritis     low spine  . Chronic back pain   . Hypertension   . Breast cancer, right breast, recurrent 06/03/2011  . Cancer 1989    breast right  . Breast cancer 2013    right breast  :  Past Surgical History  Procedure Date  . Shoulder surgery     patient does not remember date of procedure  . Abdominal hysterectomy 1974  . Joint replacement 2004, 2005    both knees   . Eye surgery     both cataracts  . Colonoscopy   . Breast surgery 1989    rt lumpectomy-axillary dissection  . Mastectomy 06/11/11    right breast  :  Current outpatient prescriptions:ACIPHEX 20 MG tablet, Daily., Disp: , Rfl: ;  aspirin 81 MG  tablet, Take 81 mg by mouth daily., Disp: , Rfl: ;  AZOR 10-40 MG per tablet, Daily., Disp: , Rfl: ;  betamethasone dipropionate (DIPROLENE) 0.05 % cream, Apply topically as needed., Disp: , Rfl: ;  calcium carbonate (OS-CAL) 600 MG TABS, Take 600 mg by mouth daily., Disp: , Rfl:  Cholecalciferol (VITAMIN D3) 1000 UNITS CAPS, Take by mouth daily., Disp: , Rfl: ;  dexlansoprazole (DEXILANT) 60 MG capsule, Take 60 mg by mouth daily., Disp: , Rfl: ;  fexofenadine (ALLEGRA) 60 MG tablet, Take 60 mg by mouth daily., Disp: , Rfl: ;  gabapentin (NEURONTIN) 100 MG capsule, Daily., Disp: , Rfl: ;  Ginkgo Biloba 40 MG TABS, Take 60 mg by mouth daily., Disp: , Rfl:  hydrochlorothiazide (MICROZIDE) 12.5 MG capsule, Daily., Disp: , Rfl: ;  oxybutynin (OXYTROL) 3.9 MG/24HR, Place 1 patch onto the skin 2 (two) times a week., Disp: , Rfl: ;  Polyethylene Glycol 3350 (MIRALAX PO), Take by mouth as needed., Disp: , Rfl: ;  simvastatin (ZOCOR) 20 MG tablet, Daily., Disp: , Rfl: ;  traMADol (ULTRAM) 50 MG tablet, Take 50 mg by mouth every 6 (six) hours as needed., Disp: , Rfl:  VITAMIN E PO, Take by mouth daily., Disp: , Rfl: ;  anastrozole (ARIMIDEX) 1 MG tablet, Take 1 tablet (1 mg total) by mouth daily., Disp: 30 tablet, Rfl: 4;  oxyCODONE-acetaminophen (PERCOCET) 5-325 MG per tablet, every 6 (six) hours as  needed. , Disp: , Rfl: :    :  Allergies  Allergen Reactions  . Morphine And Related Nausea And Vomiting  :  Family History  Problem Relation Age of Onset  . Breast cancer Maternal Aunt   . Heart disease Mother   . Cancer Brother     unaware  . Heart disease Brother   :  History   Social History  . Marital Status: Married x 61    Spouse Name: N/A    Number of Children: 4, 6 grandchildren; 66 ggreat grandchildren  . Years of Education: N/A   Occupational History  . Not on file.   Social History Main Topics  . Smoking status: Former Smoker    Quit date: 05/11/1970  . Smokeless tobacco: Never Used   . Alcohol Use: No  . Drug Use: No  . Sexually Active: Not on file   Other Topics Concern  . Not on file   Social History Narrative  . No narrative on file  :  Reproductive History G4P4 S/p hysterectomy, no hx of HRT  A comprehensive review of systems was negative.  Exam:  @IPVITALS @ General appearance: alert, cooperative and appears stated age Eyes: conjunctivae/corneas clear. PERRL, EOM's intact. Fundi benign. Throat: lips, mucosa, and tongue normal; teeth and gums normal Resp: clear to auscultation bilaterally and normal percussion bilaterally Chest wall: left sided costochondral tenderness, Status post right mastectomy with evidence of fluid collection. Left breast is normal. Breasts: normal appearance, no masses or tenderness, Left breast is normal status post right mastectomy Cardio: regular rate and rhythm, S1, S2 normal, no murmur, click, rub or gallop and normal apical impulse GI: soft, non-tender; bowel sounds normal; no masses,  no organomegaly Extremities: extremities normal, atraumatic, no cyanosis or edema Pulses: 2+ and symmetric Lymph nodes: Cervical, supraclavicular, and axillary nodes normal. Neurologic: Grossly normal   Basename 06/23/11 0925  WBC 10.0  HGB 11.3*  HCT 33.6*  PLT 297    Basename 06/23/11 0925  NA 139  K 4.4  CL 105  CO2 25  GLUCOSE 111*  BUN 13  CREATININE 0.90  CALCIUM 9.5    Blood smear review: n/a  Pathology:as above  Chest 2 View  06/10/2011  *RADIOLOGY REPORT*  Clinical Data: Preoperative respiratory examination.  Breast cancer.  CHEST - 2 VIEW  Comparison: 11/08/2003 and 08/02/2007 radiographs.  Findings: There is a small hiatal hernia.  Heart size and mediastinal contours are otherwise stable.  The basilar aeration has improved.  There is stable biapical scarring.  The lungs are otherwise clear.  There is no pleural effusion.  Postsurgical changes are present within the right breast and right axilla. There are  thoracolumbar degenerative changes associated with a mild scoliosis.  IMPRESSION: No acute cardiopulmonary process or evidence of metastatic disease. Small hiatal hernia.  Original Report Authenticated By: Gerrianne Scale, M.D.   [place] Core Biopsy  06/02/2011  *RADIOLOGY REPORT*  Clinical Data:  Small hypoechoic mass at 6 o'clock 3 cm from the right nipple with adjacent mass/calcifications at 6 o'clock 2.5 cm from the right nipple.  ULTRASOUND GUIDED VACUUM ASSISTED CORE BIOPSY OF THE RIGHT BREAST X TWO:  Comparison: 04/28/2011, 10/22/2010, 10/13/2010, 10/11/2009  The patient was originally scheduled for needle localization of a density with adjacent densities/calcifications at 6 o'clock in the right breast noted on recent study dated 04/28/2011.  At that time, the lesion could not be seen sonographically and stereotactic biopsy was not possible.  Repeat sonography was performed to see  if the lesion could be identified.  At sonography today, there is an oval hypoechoic mass at 6 o'clock approximately 3 cm from the right nipple measuring 8 x 8 x 5 mm. There is a hypoechoic area with microcalcifications measuring approximately 7 x 11 x 4 mm located adjacent to the first mass at 6 o'clock, approximately 2.5 cm from the right nipple.  As the lesion could be seen sonographically, I proposed ultrasound- guided core needle biopsy to the patient rather than surgical excision today.  If the biopsy reveals carcinoma, appropriate surgery could be performed.  The patient agreed with this plan.  I discussed this with Dr. Jamey Ripa by telephone.  He agreed with this plan.  The patient and I discussed the procedure of ultrasound-guided biopsy, including benefits and alternatives.  We discussed the high likelihood of a successful procedure. We discussed the risks of the procedure, including infection, bleeding, tissue injury, clip migration and inadequate sampling.  Informed written consent was given.  Using sterile technique, 2%  lidocaine, ultrasound guidance and a 12 gauge vacuum assisted needle, biopsy was performed of the hypoechoic area with calcifications located 2.5 cm from the right nipple at 6 o'clock.  Using sterile technique, 2% lidocaine, ultrasound guidance and a 12 gauge vacuum-assisted needle, biopsy was performed of the small mass at 6 o'clock, 3 cm from the right nipple.  At the conclusion of the procedure, a ribbon tissue marker clip was deployed into the biopsy cavity.  Follow-up 2-view mammogram was performed and dictated separately.  Histologic evalution of each biopsy site demonstrates at least ductal carcinoma in situ.  Additional stains are being performed to assess for possible invasion.  Surgical consultation is scheduled with Dr. Jamey Ripa for 06/10/11.   We have not scheduled preoperative breast MRI, deferring that decision to Dr. Jamey Ripa.  Results were discussed with the patient by telephone at her request.  She reports no complications from the procedure.  She was given the appointment time with Dr. Jamey Ripa.  IMPRESSION: Ultrasound-guided biopsy of a mass with calcifications 2.5 cm from the right nipple and a mass 3 cm from the right nipple at 6 o'clock.  At least ductal carcinoma in situ is diagnosed. Additional stains to evaluate for invasion are pending.  Surgical consultation arranged with Dr. Jamey Ripa for 06/10/11.  No apparent complications.  Original Report Authenticated By: Daryl Eastern, M.D.   [place] Core Biopsy  06/02/2011  *RADIOLOGY REPORT*  Clinical Data:  Small hypoechoic mass at 6 o'clock 3 cm from the right nipple with adjacent mass/calcifications at 6 o'clock 2.5 cm from the right nipple.  ULTRASOUND GUIDED VACUUM ASSISTED CORE BIOPSY OF THE RIGHT BREAST X TWO:  Comparison: 04/28/2011, 10/22/2010, 10/13/2010, 10/11/2009  The patient was originally scheduled for needle localization of a density with adjacent densities/calcifications at 6 o'clock in the right breast noted on recent study dated  04/28/2011.  At that time, the lesion could not be seen sonographically and stereotactic biopsy was not possible.  Repeat sonography was performed to see if the lesion could be identified.  At sonography today, there is an oval hypoechoic mass at 6 o'clock approximately 3 cm from the right nipple measuring 8 x 8 x 5 mm. There is a hypoechoic area with microcalcifications measuring approximately 7 x 11 x 4 mm located adjacent to the first mass at 6 o'clock, approximately 2.5 cm from the right nipple.  As the lesion could be seen sonographically, I proposed ultrasound- guided core needle biopsy to the patient rather than  surgical excision today.  If the biopsy reveals carcinoma, appropriate surgery could be performed.  The patient agreed with this plan.  I discussed this with Dr. Jamey Ripa by telephone.  He agreed with this plan.  The patient and I discussed the procedure of ultrasound-guided biopsy, including benefits and alternatives.  We discussed the high likelihood of a successful procedure. We discussed the risks of the procedure, including infection, bleeding, tissue injury, clip migration and inadequate sampling.  Informed written consent was given.  Using sterile technique, 2% lidocaine, ultrasound guidance and a 12 gauge vacuum assisted needle, biopsy was performed of the hypoechoic area with calcifications located 2.5 cm from the right nipple at 6 o'clock.  Using sterile technique, 2% lidocaine, ultrasound guidance and a 12 gauge vacuum-assisted needle, biopsy was performed of the small mass at 6 o'clock, 3 cm from the right nipple.  At the conclusion of the procedure, a ribbon tissue marker clip was deployed into the biopsy cavity.  Follow-up 2-view mammogram was performed and dictated separately.  Histologic evalution of each biopsy site demonstrates at least ductal carcinoma in situ.  Additional stains are being performed to assess for possible invasion.  Surgical consultation is scheduled with Dr. Jamey Ripa  for 06/10/11.   We have not scheduled preoperative breast MRI, deferring that decision to Dr. Jamey Ripa.  Results were discussed with the patient by telephone at her request.  She reports no complications from the procedure.  She was given the appointment time with Dr. Jamey Ripa.  IMPRESSION: Ultrasound-guided biopsy of a mass with calcifications 2.5 cm from the right nipple and a mass 3 cm from the right nipple at 6 o'clock.  At least ductal carcinoma in situ is diagnosed. Additional stains to evaluate for invasion are pending.  Surgical consultation arranged with Dr. Jamey Ripa for 06/10/11.  No apparent complications.  Original Report Authenticated By: Daryl Eastern, M.D.   Mm Digital Diagnostic Unilat R  06/01/2011  *RADIOLOGY REPORT*  Clinical Data:  Ultrasound-guided core needle biopsy of masses and calcifications at 6 o'clock in the right breast with clip placement.  DIGITAL DIAGNOSTIC RIGHT MAMMOGRAM  Comparison:  None.  Findings:  Films are performed following ultrasound guided biopsy of the mass and adjacent mass/calcifications at 6 o'clock, 2.5 and 3.5 cm from the right nipple.  The In[employer] ribbon clip is appropriately positioned between the two areas.  IMPRESSION: Appropriate clip placement following ultrasound-guided core needle biopsy of the right breast.  Original Report Authenticated By: Daryl Eastern, M.D.    Assessment and Plan : Pleasant elderly woman who presents with recurrent breast cancer in the right breast her daughter was 60 pounds has been diagnosed with breast cancer in the same breast recently. She will be seen in the multidisciplinary clinic. I discussed my findings with both her and her husband the patient. I recommended that she be started on an AI. I have called in a prescription for Arimidex. I did obtain results of her bone density test last performed October 2012 which essentially shows reasonably normal T score is in the femur and slight degree of osteopenia in the left  forearm. I'll plan to see her in followup in 2 months time to see if she's doing on Arimidex. I discussed side effects with her.  I will discuss genetic testing with her daughter when she is seen in our clinic.  Mervin Hack M.D. [facility]  60 minutes was spent with the patient half the time and patient-related counseling

## 2011-06-23 NOTE — Progress Notes (Signed)
[name]    [id number] 06/23/2011    1926-01-01   CC: Post op Mastectomy  HPI: The patient returns for post op follow-up. She underwent a right TM on 06/11/11. Over all she feels that she is doing well. No particular problems. Drain was removed last visit  PE: The incision is healing nicely and there is no evidence of infection or hematoma.There is a seroma     DATA REVIEWED: No new data  IMPRESSION: Patient doing well. Seroma  PLAN: Her next visit will be in one week. Seroma aspirated 95 cc serous fluid. No evidence of infection, but with seroma there is increased chance so will put her on Keflex 500 TID X 7 days

## 2011-06-23 NOTE — Telephone Encounter (Signed)
gave patient appointment for 07-15-2011 printed out calendar and gave to the patient

## 2011-07-01 ENCOUNTER — Ambulatory Visit (INDEPENDENT_AMBULATORY_CARE_PROVIDER_SITE_OTHER): Payer: Medicare Other | Admitting: Surgery

## 2011-07-01 ENCOUNTER — Other Ambulatory Visit: Payer: Self-pay | Admitting: *Deleted

## 2011-07-01 ENCOUNTER — Encounter (INDEPENDENT_AMBULATORY_CARE_PROVIDER_SITE_OTHER): Payer: Self-pay | Admitting: Surgery

## 2011-07-01 VITALS — BP 132/66 | HR 76 | Temp 97.9°F | Resp 18 | Ht 63.0 in | Wt 189.0 lb

## 2011-07-01 DIAGNOSIS — C50919 Malignant neoplasm of unspecified site of unspecified female breast: Secondary | ICD-10-CM

## 2011-07-01 DIAGNOSIS — Z9889 Other specified postprocedural states: Secondary | ICD-10-CM

## 2011-07-01 MED ORDER — FLUCONAZOLE 100 MG PO TABS: 150.0000 mg | ORAL_TABLET | Freq: Every day | ORAL | Status: AC

## 2011-07-01 NOTE — Progress Notes (Signed)
[name]    [id number] 07/01/2011    May 16, 1925   CC: Post op Mastectomy  HPI: The patient returns for post op follow-up. She underwent a right TM on 06/11/11. Over all she feels that she is doing well. No particular problems. Seroma aspirated last visit  PE: The incision is healing nicely and there is no evidence of infection or hematoma.There is a seroma     DATA REVIEWED: No new data  IMPRESSION: Patient doing well. Seroma  PLAN: Her next visit will be in one week. Seroma aspirated 95 cc serous fluid. No evidence of infection. If she continues large volume she may need seroma cath. She will f/u with dr Donnie Coffin with some issues about vaginal dryness

## 2011-07-01 NOTE — Patient Instructions (Signed)
Try to keep a soft small pillow over the mastectomy and armpit area. We will need to have you come back next week

## 2011-07-01 NOTE — Telephone Encounter (Signed)
Pt. Called c/o vaginal itching, burning and discharge.  Discussed with Pilar Plate PA.  Will give one dose of diflucan and she is to call us Friday if sx have not improved

## 2011-07-06 ENCOUNTER — Encounter (INDEPENDENT_AMBULATORY_CARE_PROVIDER_SITE_OTHER): Payer: Medicare Other | Admitting: Surgery

## 2011-07-06 ENCOUNTER — Encounter (INDEPENDENT_AMBULATORY_CARE_PROVIDER_SITE_OTHER): Payer: Self-pay | Admitting: Surgery

## 2011-07-06 ENCOUNTER — Telehealth (INDEPENDENT_AMBULATORY_CARE_PROVIDER_SITE_OTHER): Payer: Self-pay | Admitting: Surgery

## 2011-07-06 ENCOUNTER — Encounter: Payer: Self-pay | Admitting: *Deleted

## 2011-07-06 ENCOUNTER — Ambulatory Visit (INDEPENDENT_AMBULATORY_CARE_PROVIDER_SITE_OTHER): Payer: Medicare Other | Admitting: Surgery

## 2011-07-06 VITALS — BP 136/77 | HR 70 | Temp 98.0°F | Resp 14 | Ht 63.0 in | Wt 189.6 lb

## 2011-07-06 DIAGNOSIS — Z09 Encounter for follow-up examination after completed treatment for conditions other than malignant neoplasm: Secondary | ICD-10-CM

## 2011-07-06 NOTE — Progress Notes (Signed)
Mailed after appt letter to pt. 

## 2011-07-06 NOTE — Telephone Encounter (Signed)
Appt made 07/13/11 @ 12:20pm. Left message with appt date/time on machine to call with any problems or if not good date/time.

## 2011-07-06 NOTE — Progress Notes (Signed)
Subjective:     Patient ID: [name], female   DOB: 20-Mar-1926, 78 y.o.   MRN: [id number]  HPI This is a patient of Dr. Tenna Child who is status post a right mastectomy. He had a drain 95 cc of seroma fluid we saw her last period she reports the fluid has reaccumulated and is causing some slight discomfort.  Review of Systems     Objective:   Physical Exam    On exam, the incision is clean. I inserted an 18-gauge needle into the right mastectomy site and drained off 60 cc of seroma fluid Assessment:     Right breast postoperative seroma    Plan:     I will have her come back and see Dr. Jamey Ripa next week

## 2011-07-08 ENCOUNTER — Telehealth: Payer: Self-pay | Admitting: *Deleted

## 2011-07-08 NOTE — Telephone Encounter (Signed)
patient confirmed new date and time on 08-12-2011 starting at 1:30

## 2011-07-13 ENCOUNTER — Encounter (INDEPENDENT_AMBULATORY_CARE_PROVIDER_SITE_OTHER): Payer: Self-pay | Admitting: Surgery

## 2011-07-13 ENCOUNTER — Ambulatory Visit (INDEPENDENT_AMBULATORY_CARE_PROVIDER_SITE_OTHER): Payer: Medicare Other | Admitting: Surgery

## 2011-07-13 VITALS — BP 122/78 | HR 76 | Resp 16 | Ht 63.0 in | Wt 187.0 lb

## 2011-07-13 DIAGNOSIS — Z09 Encounter for follow-up examination after completed treatment for conditions other than malignant neoplasm: Secondary | ICD-10-CM

## 2011-07-13 NOTE — Progress Notes (Signed)
[name]    [id number] 07/13/2011    12-Jan-1926   CC: Post op Mastectomy  HPI: The patient returns for post op follow-up. She underwent a right TM on 06/11/11. Over all she feels that she is doing well. No particular problems. Seroma aspirated last visit  PE: The incision is healing nicely and there is no evidence of infection or hematoma.There is a seroma     DATA REVIEWED: No new data  IMPRESSION: Patient doing well. Seroma  PLAN: Her next visit will be in one month, sooner if fluid reaccumulates. Seroma aspirated 30 cc serous fluid. No evidence of infection.Will send to ABC class.

## 2011-07-13 NOTE — Patient Instructions (Signed)
If you think any fluid is coming back, see me next week, otherwise see me in a month.     [name]  is OK to attend the [facility] Class Currie Paris, MD, [facility], [place] [phone] 07/13/2011 12:36 PM

## 2011-07-15 ENCOUNTER — Ambulatory Visit: Payer: Medicare Other | Admitting: Oncology

## 2011-08-07 ENCOUNTER — Telehealth: Payer: Self-pay | Admitting: Oncology

## 2011-08-07 NOTE — Telephone Encounter (Signed)
S/w the pt's husband and he is aware of the r/s appts from 08/12/2011 to 09/14/2011 due to the md is out of the office

## 2011-08-12 ENCOUNTER — Ambulatory Visit: Payer: Medicare Other | Admitting: Oncology

## 2011-08-12 ENCOUNTER — Other Ambulatory Visit: Payer: Medicare Other | Admitting: Lab

## 2011-08-18 ENCOUNTER — Telehealth: Payer: Self-pay | Admitting: *Deleted

## 2011-08-18 ENCOUNTER — Telehealth: Payer: Self-pay | Admitting: Oncology

## 2011-08-18 NOTE — Telephone Encounter (Signed)
S/w the pt and she is aware of her r/s appts from 09/14/2011 to 09/28/2011 due to the md is out covering at another facility

## 2011-08-18 NOTE — Telephone Encounter (Signed)
Pt reports that she has been taking anastrozole and has been experiencing severe joint pain. Pt states that since "stopping" the anastrozole her joint pain has subsided. Pt is to see MD 09/28/11 and will discuss a different AI at that time

## 2011-08-28 ENCOUNTER — Encounter (INDEPENDENT_AMBULATORY_CARE_PROVIDER_SITE_OTHER): Payer: Medicare Other | Admitting: Surgery

## 2011-09-04 ENCOUNTER — Encounter (INDEPENDENT_AMBULATORY_CARE_PROVIDER_SITE_OTHER): Payer: Self-pay | Admitting: Surgery

## 2011-09-04 ENCOUNTER — Ambulatory Visit (INDEPENDENT_AMBULATORY_CARE_PROVIDER_SITE_OTHER): Payer: Medicare Other | Admitting: Surgery

## 2011-09-04 VITALS — BP 134/56 | HR 104 | Temp 98.4°F | Ht 63.0 in | Wt 192.3 lb

## 2011-09-04 DIAGNOSIS — C50911 Malignant neoplasm of unspecified site of right female breast: Secondary | ICD-10-CM

## 2011-09-04 DIAGNOSIS — Z9889 Other specified postprocedural states: Secondary | ICD-10-CM

## 2011-09-04 DIAGNOSIS — C50919 Malignant neoplasm of unspecified site of unspecified female breast: Secondary | ICD-10-CM

## 2011-09-04 NOTE — Progress Notes (Signed)
[name]    [id number] 09/04/2011    06/05/1925   CC: Post op Mastectomy  HPI: The patient returns for post op follow-up. She underwent a right TM on 06/11/11. Over all she feels that she is doing well. No particular problems. Seroma aspirated last visit  PE: The incision is healing nicely and there is no evidence of infection or hematoma.There is a seroma     DATA REVIEWED: No new data  IMPRESSION: Patient doing well. Seroma  PLAN: Her next visit will be in 2 weeks , sooner if fluid reaccumulates. Seroma aspirated 60 cc serous fluid. No evidence of infection.

## 2011-09-14 ENCOUNTER — Other Ambulatory Visit: Payer: Medicare Other | Admitting: Lab

## 2011-09-14 ENCOUNTER — Ambulatory Visit: Payer: Medicare Other | Admitting: Oncology

## 2011-09-21 ENCOUNTER — Ambulatory Visit (INDEPENDENT_AMBULATORY_CARE_PROVIDER_SITE_OTHER): Payer: Medicare Other | Admitting: Surgery

## 2011-09-21 ENCOUNTER — Encounter (INDEPENDENT_AMBULATORY_CARE_PROVIDER_SITE_OTHER): Payer: Self-pay | Admitting: Surgery

## 2011-09-21 VITALS — BP 140/64 | HR 76 | Temp 98.0°F | Resp 18 | Ht 63.0 in | Wt 188.6 lb

## 2011-09-21 DIAGNOSIS — Z853 Personal history of malignant neoplasm of breast: Secondary | ICD-10-CM

## 2011-09-21 NOTE — Patient Instructions (Signed)
See me in three months, sooner if you have any problems with the incision

## 2011-09-21 NOTE — Progress Notes (Signed)
[name]    [id number] 09/21/2011    18-May-1925   CC: Post op Mastectomy  HPI: The patient returns for post op follow-up. She underwent a right TM on 06/11/11. Over all she feels that she is doing well. No particular problems. Seroma aspirated last visit  PE: The incision is healing nicely and there is no evidence of infection or hematoma.There is a seroma     DATA REVIEWED: No new data  IMPRESSION: Patient doing well. 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

## 2011-09-28 ENCOUNTER — Telehealth: Payer: Self-pay | Admitting: *Deleted

## 2011-09-28 ENCOUNTER — Other Ambulatory Visit: Payer: Medicare Other | Admitting: Lab

## 2011-09-28 ENCOUNTER — Ambulatory Visit (HOSPITAL_BASED_OUTPATIENT_CLINIC_OR_DEPARTMENT_OTHER): Payer: Medicare Other | Admitting: Oncology

## 2011-09-28 VITALS — BP 128/67 | HR 73 | Temp 97.7°F | Ht 63.0 in | Wt 189.7 lb

## 2011-09-28 DIAGNOSIS — C50911 Malignant neoplasm of unspecified site of right female breast: Secondary | ICD-10-CM

## 2011-09-28 DIAGNOSIS — C50919 Malignant neoplasm of unspecified site of unspecified female breast: Secondary | ICD-10-CM

## 2011-09-28 DIAGNOSIS — E559 Vitamin D deficiency, unspecified: Secondary | ICD-10-CM

## 2011-09-28 DIAGNOSIS — Z170 Estrogen receptor positive status [ER+]: Secondary | ICD-10-CM

## 2011-09-28 NOTE — Telephone Encounter (Signed)
Gave patient appointment for 03-31-2012 starting at 1:00pm printed out calendar and gave to the patient

## 2011-09-28 NOTE — Progress Notes (Signed)
ID: PARASKEVI FUNEZ  DOB: [birth year]  MR#: [id number]  CSN#: [id number]   Interval History:  History of recurrent breast cancer right breast in 2/ 2013 was initial cancer diagnosed in 1989, status post mastectomy every 2013 for multifocal ER/PR positive breast cancer. Currently on Arimidex. ROS:  She is doing well. No complaints related to Arimidex. Appetite good weight is stable.  Allergies  Allergen Reactions  . Arimidex (Anastrozole)     Neck cramping  . Morphine And Related Nausea And Vomiting    Current Outpatient Prescriptions  Medication Sig Dispense Refill  . ACIPHEX 20 MG tablet Daily.      Marland Kitchen anastrozole (ARIMIDEX) 1 MG tablet       . aspirin 81 MG tablet Take 81 mg by mouth daily.      . AZOR 10-40 MG per tablet Daily.      . betamethasone dipropionate (DIPROLENE) 0.05 % cream Apply topically as needed.      . calcium carbonate (OS-CAL) 600 MG TABS Take 600 mg by mouth daily.      . Cholecalciferol (VITAMIN D3) 1000 UNITS CAPS Take by mouth daily.      Marland Kitchen dexlansoprazole (DEXILANT) 60 MG capsule Take 60 mg by mouth daily.      . fexofenadine (ALLEGRA) 60 MG tablet Take 60 mg by mouth daily.      Marland Kitchen gabapentin (NEURONTIN) 100 MG capsule Daily.      . Ginkgo Biloba 40 MG TABS Take 60 mg by mouth daily.      . hydrochlorothiazide (MICROZIDE) 12.5 MG capsule Daily.      Marland Kitchen oxybutynin (OXYTROL) 3.9 MG/24HR Place 1 patch onto the skin 2 (two) times a week.      . Polyethylene Glycol 3350 (MIRALAX PO) Take by mouth as needed.      . simvastatin (ZOCOR) 20 MG tablet Daily.      Marland Kitchen VITAMIN E PO Take by mouth daily.         Objective: Pleasant woman looking stated age. Filed Vitals:   09/28/11 1433  BP: 128/67  Pulse: 73  Temp: 97.7 F (36.5 C)    BMI: Body mass index is 33.60 kg/(m^2).   ECOG FS:  Physical Exam:   Sclerae unicteric  Oropharynx clear  No peripheral adenopathy  Lungs clear -- no rales or rhonchi  Heart regular rate and rhythm  Abdomen benign  MSK no focal  spinal tenderness, no peripheral edema  Neuro nonfocal  Breast exam: Status post mastectomy, surgical incision is healed well. There is no obvious evidence of local recurrence., Contralateral breast is normal. Both axilla normal. Lab Results:      Chemistry      Component Value Date/Time   NA 139 06/23/2011 0925   K 4.4 06/23/2011 0925   CL 105 06/23/2011 0925   CO2 25 06/23/2011 0925   BUN 13 06/23/2011 0925   CREATININE 0.90 06/23/2011 0925      Component Value Date/Time   CALCIUM 9.5 06/23/2011 0925   ALKPHOS 106 06/23/2011 0925   AST 17 06/23/2011 0925   ALT 12 06/23/2011 0925   BILITOT 0.3 06/23/2011 0925       Lab Results  Component Value Date   WBC 10.0 06/23/2011   HGB 11.3* 06/23/2011   HCT 33.6* 06/23/2011   MCV 89.7 06/23/2011   PLT 297 06/23/2011   NEUTROABS 6.8* 06/23/2011    Studies/Results:  No results found.  Assessment: Pleasant postmenopausal woman with history of recurrent breast cancer. She  is now status post mastectomy on Arimidex  Plan: Her daughter is with her today. She is also been seen in our practice. She was recently diagnosed with breast cancer at the same time as her mother. Her family declined genetic testing. I will plan see the patient back in 6 months time with appropriate imaging studies. Of note is the most recent bone density test in October 2012 was within normal limits.    Vonzell Lindblad 09/28/2011

## 2011-09-28 NOTE — Telephone Encounter (Signed)
made patient appointment for 03-31-2012 lab and md printed out calendar and gave to the patient

## 2011-10-06 ENCOUNTER — Other Ambulatory Visit: Payer: Self-pay | Admitting: Oncology

## 2011-10-06 DIAGNOSIS — Z9011 Acquired absence of right breast and nipple: Secondary | ICD-10-CM

## 2011-10-06 DIAGNOSIS — Z1231 Encounter for screening mammogram for malignant neoplasm of breast: Secondary | ICD-10-CM

## 2011-10-23 ENCOUNTER — Encounter (INDEPENDENT_AMBULATORY_CARE_PROVIDER_SITE_OTHER): Payer: Self-pay | Admitting: Surgery

## 2011-12-22 ENCOUNTER — Ambulatory Visit
Admission: RE | Admit: 2011-12-22 | Discharge: 2011-12-22 | Disposition: A | Discharge status: Home / Self Care | Payer: Medicare Other | Source: Ambulatory Visit | Attending: Oncology | Admitting: Oncology

## 2011-12-22 ENCOUNTER — Encounter (INDEPENDENT_AMBULATORY_CARE_PROVIDER_SITE_OTHER): Payer: Self-pay | Admitting: Surgery

## 2011-12-22 ENCOUNTER — Ambulatory Visit (INDEPENDENT_AMBULATORY_CARE_PROVIDER_SITE_OTHER): Payer: Medicare Other | Admitting: Surgery

## 2011-12-22 VITALS — BP 132/70 | HR 76 | Temp 97.9°F | Resp 16 | Ht 63.0 in | Wt 191.0 lb

## 2011-12-22 DIAGNOSIS — Z9011 Acquired absence of right breast and nipple: Secondary | ICD-10-CM

## 2011-12-22 DIAGNOSIS — Z853 Personal history of malignant neoplasm of breast: Secondary | ICD-10-CM

## 2011-12-22 DIAGNOSIS — Z1231 Encounter for screening mammogram for malignant neoplasm of breast: Secondary | ICD-10-CM

## 2011-12-22 NOTE — Patient Instructions (Signed)
See me again in six months 

## 2011-12-22 NOTE — Progress Notes (Signed)
[name]    [id number] 12/22/2011    08/02/1925   CC: Post op Mastectomy  HPI: The patient returns for post op follow-up. She underwent a right TM on 06/11/11. Over all she feels that she is doing well. No particular problems. Seroma we we lected to leave alone last visit  PE: The incision is healing nicely and there is no evidence of infection or hematoma.There is a much smaller seroma. The left breast is normal and there is no evidence of local recurrence. Lymphatics - no adenopathy noted on    DATA REVIEWED: No new data Mammogram earlier today - report pending but patient told it was ok  IMPRESSION: Patient doing well. Seroma, improving  PLAN: This appears to be are solving seroma. No evidence of recurrence or other problem RTC six months

## 2011-12-29 ENCOUNTER — Other Ambulatory Visit: Payer: Self-pay | Admitting: *Deleted

## 2011-12-29 DIAGNOSIS — C50919 Malignant neoplasm of unspecified site of unspecified female breast: Secondary | ICD-10-CM

## 2011-12-29 MED ORDER — ANASTROZOLE 1 MG PO TABS: 1.0000 mg | ORAL_TABLET | Freq: Every day | ORAL | Status: DC

## 2012-01-07 ENCOUNTER — Telehealth (INDEPENDENT_AMBULATORY_CARE_PROVIDER_SITE_OTHER): Payer: Self-pay | Admitting: General Surgery

## 2012-01-07 NOTE — Telephone Encounter (Signed)
Pt called to have Dr. Vilinda Flake a prescription for a new prosthesis and bra to [facility].  Done.

## 2012-03-31 ENCOUNTER — Other Ambulatory Visit (HOSPITAL_BASED_OUTPATIENT_CLINIC_OR_DEPARTMENT_OTHER): Payer: Medicare Other | Admitting: Lab

## 2012-03-31 ENCOUNTER — Ambulatory Visit (HOSPITAL_BASED_OUTPATIENT_CLINIC_OR_DEPARTMENT_OTHER): Payer: Medicare Other | Admitting: Oncology

## 2012-03-31 ENCOUNTER — Telehealth: Payer: Self-pay | Admitting: *Deleted

## 2012-03-31 VITALS — BP 133/68 | HR 74 | Temp 98.8°F | Resp 20 | Ht 63.0 in | Wt 199.6 lb

## 2012-03-31 DIAGNOSIS — C50919 Malignant neoplasm of unspecified site of unspecified female breast: Secondary | ICD-10-CM

## 2012-03-31 DIAGNOSIS — R21 Rash and other nonspecific skin eruption: Secondary | ICD-10-CM

## 2012-03-31 DIAGNOSIS — M542 Cervicalgia: Secondary | ICD-10-CM

## 2012-03-31 DIAGNOSIS — E559 Vitamin D deficiency, unspecified: Secondary | ICD-10-CM

## 2012-03-31 DIAGNOSIS — C50911 Malignant neoplasm of unspecified site of right female breast: Secondary | ICD-10-CM

## 2012-03-31 DIAGNOSIS — Z170 Estrogen receptor positive status [ER+]: Secondary | ICD-10-CM

## 2012-03-31 LAB — CBC WITH DIFFERENTIAL/PLATELET
BASO%: 0.2 % (ref 0.0–2.0)
Basophils Absolute: 0 10*3/uL (ref 0.0–0.1)
EOS%: 1.8 % (ref 0.0–7.0)
HGB: 12 g/dL (ref 11.6–15.9)
MCH: 30.9 pg (ref 25.1–34.0)
MONO#: 0.9 10*3/uL (ref 0.1–0.9)
RDW: 13.5 % (ref 11.2–14.5)
WBC: 8.5 10*3/uL (ref 3.9–10.3)
lymph#: 2.4 10*3/uL (ref 0.9–3.3)

## 2012-03-31 LAB — COMPREHENSIVE METABOLIC PANEL (CC13)
AST: 18 U/L (ref 5–34)
Albumin: 3.4 g/dL — ABNORMAL LOW (ref 3.5–5.0)
Alkaline Phosphatase: 93 U/L (ref 40–150)
Chloride: 101 mEq/L (ref 98–107)
Glucose: 114 mg/dl — ABNORMAL HIGH (ref 70–99)
Potassium: 4.2 mEq/L (ref 3.5–5.1)
Sodium: 140 mEq/L (ref 136–145)
Total Protein: 6.7 g/dL (ref 6.4–8.3)

## 2012-03-31 MED ORDER — NYSTATIN 100000 UNIT/GM EX POWD: Freq: Four times a day (QID) | CUTANEOUS | Status: DC

## 2012-03-31 MED ORDER — TAMOXIFEN CITRATE 20 MG PO TABS: 20.0000 mg | ORAL_TABLET | Freq: Every day | ORAL | Status: DC

## 2012-03-31 NOTE — Progress Notes (Signed)
ID: CAITLYNE INGHAM  DOB: [birth year]  MR#: [id number]  CSN#: [id number]   Interval History:  History of recurrent breast cancer right breast in 2/ 2013 was initial cancer diagnosed in 1989, status post mastectomy every 2013 for multifocal ER/PR positive breast cancer.  She is having hot flashes , and stopped taking arimidex  about 2 months ago. She is having neck pain.she also noted a rash under her breast.  ROS:  She is doing well. No complaints related to Arimidex. Appetite good weight is stable.  Allergies  Allergen Reactions  . Arimidex (Anastrozole)     Neck cramping  . Morphine And Related Nausea And Vomiting    Current Outpatient Prescriptions  Medication Sig Dispense Refill  . ACIPHEX 20 MG tablet Daily.      Marland Kitchen anastrozole (ARIMIDEX) 1 MG tablet Take 1 tablet (1 mg total) by mouth daily.  30 tablet  3  . aspirin 81 MG tablet Take 81 mg by mouth daily.      . AZOR 10-40 MG per tablet Daily.      . betamethasone dipropionate (DIPROLENE) 0.05 % cream Apply topically as needed.      . calcium carbonate (OS-CAL) 600 MG TABS Take 600 mg by mouth daily.      . Cholecalciferol (VITAMIN D3) 1000 UNITS CAPS Take by mouth daily.      Marland Kitchen dexlansoprazole (DEXILANT) 60 MG capsule Take 60 mg by mouth daily.      . fexofenadine (ALLEGRA) 60 MG tablet Take 60 mg by mouth daily.      Marland Kitchen gabapentin (NEURONTIN) 100 MG capsule Daily.      . Ginkgo Biloba 40 MG TABS Take 60 mg by mouth daily.      . hydrochlorothiazide (MICROZIDE) 12.5 MG capsule Daily.      Marland Kitchen oxybutynin (OXYTROL) 3.9 MG/24HR Place 1 patch onto the skin 2 (two) times a week.      . Polyethylene Glycol 3350 (MIRALAX PO) Take by mouth as needed.      . simvastatin (ZOCOR) 20 MG tablet Daily.      Marland Kitchen VITAMIN E PO Take by mouth daily.         Objective: Pleasant woman looking stated age. Filed Vitals:   03/31/12 1319  BP: 133/68  Pulse: 74  Temp: 98.8 F (37.1 C)  Resp: 20    BMI: Body mass index is 35.36 kg/(m^2).   ECOG  FS:  Physical Exam:   Sclerae unicteric  Oropharynx clear  No peripheral adenopathy  Lungs clear -- no rales or rhonchi  Heart regular rate and rhythm  Abdomen benign  MSK no focal spinal tenderness, no peripheral edema  Neuro nonfocal  Breast exam: Status post mastectomy, surgical incision is healed well. There is no obvious evidence of local recurrence., Contralateral breast is normal. Both axilla normal. Lab Results:      Chemistry      Component Value Date/Time   NA 140 03/31/2012 1250   NA 139 06/23/2011 0925   K 4.2 03/31/2012 1250   K 4.4 06/23/2011 0925   CL 101 03/31/2012 1250   CL 105 06/23/2011 0925   CO2 30* 03/31/2012 1250   CO2 25 06/23/2011 0925   BUN 13.0 03/31/2012 1250   BUN 13 06/23/2011 0925   CREATININE 0.9 03/31/2012 1250   CREATININE 0.90 06/23/2011 0925      Component Value Date/Time   CALCIUM 9.2 03/31/2012 1250   CALCIUM 9.5 06/23/2011 0925   ALKPHOS 93 03/31/2012 1250  ALKPHOS 106 06/23/2011 0925   AST 18 03/31/2012 1250   AST 17 06/23/2011 0925   ALT 19 03/31/2012 1250   ALT 12 06/23/2011 0925   BILITOT 0.28 03/31/2012 1250   BILITOT 0.3 06/23/2011 0925       Lab Results  Component Value Date   WBC 8.5 03/31/2012   HGB 12.0 03/31/2012   HCT 35.9 03/31/2012   MCV 92.5 03/31/2012   PLT 306 03/31/2012   NEUTROABS 5.1 03/31/2012    Studies/Results:  No results found.  Assessment: Pleasant postmenopausal woman with history of recurrent breast cancer. She is now status post mastectomy on Arimidex  Plan: She has agreed to initiate a trial of tamoxifen which she tolerated the past. We once again discussed the possible side effects. She does have some lower extremity edema which resolves in the mornings. He is on low-dose aspirin. I also recommended that nystatin powder for the area in the inframammary fold. She'll be seen in followup in 6 months she knows to call should she have any other concerns. I scheduled imaging  studies.   Janitza Revuelta 03/31/2012

## 2012-03-31 NOTE — Telephone Encounter (Signed)
Gave patient instructions about getting the 2014 appointment

## 2012-06-28 ENCOUNTER — Ambulatory Visit (INDEPENDENT_AMBULATORY_CARE_PROVIDER_SITE_OTHER): Payer: Medicare Other | Admitting: Surgery

## 2012-07-12 ENCOUNTER — Encounter (INDEPENDENT_AMBULATORY_CARE_PROVIDER_SITE_OTHER): Payer: Self-pay | Admitting: Surgery

## 2012-07-12 ENCOUNTER — Ambulatory Visit (INDEPENDENT_AMBULATORY_CARE_PROVIDER_SITE_OTHER): Payer: Medicare Other | Admitting: Surgery

## 2012-07-12 VITALS — BP 126/62 | HR 77 | Temp 98.0°F | Resp 16 | Ht 63.0 in | Wt 196.0 lb

## 2012-07-12 DIAGNOSIS — Z853 Personal history of malignant neoplasm of breast: Secondary | ICD-10-CM

## 2012-07-12 NOTE — Patient Instructions (Signed)
Be sure to have your mammogram as scheduled in September. Continued annual followups and come back if there are any new problems that develop in the meantime

## 2012-07-12 NOTE — Progress Notes (Signed)
NAME: [name]       DOB: 1925-07-05           DATE: 07/12/2012       MRN: [id number]  CC:   Chief Complaint  Patient presents with  . Breast Cancer Long Term Follow Up    [name] is a 79 y.o.Marland Kitchenfemale who presents for routine followup of her Recurrent right breast cancer diagnosed in 2013 and treated with mastectomy. She has no problems or concerns on either side.  PFSH: She has had no significant changes since the last visit here.  ROS: There have been no significant changes since the last visit here  EXAM:  VS: BP 126/62  Pulse 77  Temp(Src) 98 F (36.7 C) (Temporal)  Resp 16  Ht 5\' 3"  (1.6 m)  Wt 196 lb (88.905 kg)  BMI 34.73 kg/m2  SpO2 97%  General: The patient is alert, oriented, generally healthy appearing, NAD. Mood and affect are normal.  Breasts:  right side is surgically absent. Left side is normal. There is no evidence of local recurrence.  Lymphatics: She has no axillary or supraclavicular adenopathy on either side.  Extremities: Full ROM of the surgical side with no lymphedema noted.  Data Reviewed: I reviewed the mammogram which was done in September and it was negative.  Impression: Doing well, with no evidence of recurrent cancer or new cancer  Plan: Will continue to follow up on an annual basis here.be sure to have her mammogram in September

## 2012-11-04 ENCOUNTER — Telehealth: Payer: Self-pay | Admitting: *Deleted

## 2012-11-04 NOTE — Telephone Encounter (Signed)
Spoke to pt confirmed new appt date and time for new provider- Dr. Welton Flakes on 11/22/12 at 1130.  Calendar and letter sent.

## 2012-11-22 ENCOUNTER — Ambulatory Visit (HOSPITAL_BASED_OUTPATIENT_CLINIC_OR_DEPARTMENT_OTHER): Payer: 59 | Admitting: Oncology

## 2012-11-22 ENCOUNTER — Ambulatory Visit (HOSPITAL_BASED_OUTPATIENT_CLINIC_OR_DEPARTMENT_OTHER): Payer: 59 | Admitting: Lab

## 2012-11-22 ENCOUNTER — Telehealth: Payer: Self-pay | Admitting: Oncology

## 2012-11-22 VITALS — BP 175/75 | HR 92 | Temp 98.6°F | Resp 18 | Ht 63.0 in | Wt 194.4 lb

## 2012-11-22 DIAGNOSIS — Z170 Estrogen receptor positive status [ER+]: Secondary | ICD-10-CM

## 2012-11-22 DIAGNOSIS — C50919 Malignant neoplasm of unspecified site of unspecified female breast: Secondary | ICD-10-CM

## 2012-11-22 DIAGNOSIS — C50911 Malignant neoplasm of unspecified site of right female breast: Secondary | ICD-10-CM

## 2012-11-22 LAB — CBC WITH DIFFERENTIAL/PLATELET
BASO%: 0.3 % (ref 0.0–2.0)
EOS%: 1.2 % (ref 0.0–7.0)
LYMPH%: 26.9 % (ref 14.0–49.7)
MCH: 32.5 pg (ref 25.1–34.0)
MCHC: 35 g/dL (ref 31.5–36.0)
MCV: 92.8 fL (ref 79.5–101.0)
MONO#: 0.9 10*3/uL (ref 0.1–0.9)
MONO%: 9.8 % (ref 0.0–14.0)
NEUT%: 61.8 % (ref 38.4–76.8)
Platelets: 259 10*3/uL (ref 145–400)
RBC: 4.07 10*6/uL (ref 3.70–5.45)
WBC: 8.8 10*3/uL (ref 3.9–10.3)

## 2012-11-22 LAB — COMPREHENSIVE METABOLIC PANEL (CC13)
ALT: 29 U/L (ref 0–55)
AST: 25 U/L (ref 5–34)
Alkaline Phosphatase: 65 U/L (ref 40–150)
Creatinine: 0.8 mg/dL (ref 0.6–1.1)
Sodium: 141 mEq/L (ref 136–145)
Total Bilirubin: 0.3 mg/dL (ref 0.20–1.20)
Total Protein: 7.2 g/dL (ref 6.4–8.3)

## 2012-11-22 MED ORDER — TAMOXIFEN CITRATE 20 MG PO TABS: 20.0000 mg | ORAL_TABLET | Freq: Every day | ORAL | Status: DC

## 2012-11-22 NOTE — Patient Instructions (Addendum)
Continue tamoxifen 20 mg dily  I will see you back in 6 months

## 2012-11-22 NOTE — Telephone Encounter (Signed)
, °

## 2012-12-05 NOTE — Progress Notes (Signed)
OFFICE PROGRESS NOTE  CCLupita Raider, MD [address] [place] [address]  DIAGNOSIS: 79 year old female with history of breast cancer status post mastectomy in February 2013 for multifocal ER/PR positive breast cancer  PRIOR THERAPY:  #1 patient had history of rest cancer in 1989. She subsequently had a recurrence in [place] 2013 of the right breast. She had a mastectomy in 2013. Final pathology revealed multifocal ER positive PR positive breast cancer.  #2 patient was begun on Arimidex 1 mg daily in October 2013.  CURRENT THERAPY:Arimidex 1 mg daily  INTERVAL HISTORY: JENE HUQ 79 y.o. female returns for followup visit today. Overall she's doing well without any significant problems she denies any fevers chills night sweats. She is tolerating Arimidex quite nicely.  MEDICAL HISTORY: Past Medical History  Diagnosis Date  . Arthritis     low spine  . Chronic back pain   . Hypertension   . Breast cancer, right breast, recurrent 06/03/2011  . Cancer 1989    breast right  . Breast cancer 2013    right breast    ALLERGIES:  is allergic to arimidex and morphine and related.  MEDICATIONS:  Current Outpatient Prescriptions  Medication Sig Dispense Refill  . ACIPHEX 20 MG tablet Daily.      Marland Kitchen aspirin 81 MG tablet Take 81 mg by mouth daily.      Marland Kitchen atorvastatin (LIPITOR) 20 MG tablet       . AZOR 10-40 MG per tablet Daily.      . betamethasone dipropionate (DIPROLENE) 0.05 % cream Apply topically as needed.      . calcium carbonate (OS-CAL) 600 MG TABS Take 600 mg by mouth daily.      . CELEBREX 200 MG capsule       . Cholecalciferol (VITAMIN D3) 1000 UNITS CAPS Take by mouth daily.      . cyclobenzaprine (FLEXERIL) 10 MG tablet       . dexlansoprazole (DEXILANT) 60 MG capsule Take 60 mg by mouth daily.      . fexofenadine (ALLEGRA) 60 MG tablet Take 60 mg by mouth daily.      . furosemide (LASIX) 20 MG tablet       . gabapentin (NEURONTIN) 100  MG capsule Daily.      . Ginkgo Biloba 40 MG TABS Take 60 mg by mouth daily.      . hydrochlorothiazide (MICROZIDE) 12.5 MG capsule Daily.      Marland Kitchen HYDROcodone-acetaminophen (NORCO/VICODIN) 5-325 MG per tablet       . nystatin (MYCOSTATIN) powder Apply topically 4 (four) times daily.  15 g  0  . oxybutynin (OXYTROL) 3.9 MG/24HR Place 1 patch onto the skin 2 (two) times a week.      Marland Kitchen oxyCODONE-acetaminophen (PERCOCET/ROXICET) 5-325 MG per tablet       . Polyethylene Glycol 3350 (MIRALAX PO) Take by mouth as needed.      . predniSONE (STERAPRED UNI-PAK) 5 MG TABS tablet       . simvastatin (ZOCOR) 20 MG tablet Daily.      . tamoxifen (NOLVADEX) 20 MG tablet Take 1 tablet (20 mg total) by mouth daily.  90 tablet  6  . tolterodine (DETROL LA) 4 MG 24 hr capsule       . traMADol (ULTRAM) 50 MG tablet       . VESICARE 5 MG tablet       . VITAMIN E PO Take by mouth daily.  No current facility-administered medications for this visit.    SURGICAL HISTORY:  Past Surgical History  Procedure Laterality Date  . Shoulder surgery      patient does not remember date of procedure  . Abdominal hysterectomy  1974  . Joint replacement  2004, 2005    both knees   . Eye surgery      both cataracts  . Colonoscopy    . Breast surgery  1989    rt lumpectomy-axillary dissection  . Mastectomy  06/11/11    right breast    REVIEW OF SYSTEMS:  Pertinent items are noted in HPI.   HEALTH MAINTENANCE:    PHYSICAL EXAMINATION: Blood pressure 175/75, pulse 92, temperature 98.6 F (37 C), temperature source Oral, resp. rate 18, height 5\' 3"  (1.6 m), weight 194 lb 6.4 oz (88.179 kg). Body mass index is 34.44 kg/(m^2). ECOG PERFORMANCE STATUS: 0 - Asymptomatic   General appearance: alert, cooperative and appears stated age Resp: clear to auscultation bilaterally Cardio: regular rate and rhythm GI: soft, non-tender; bowel sounds normal; no masses,  no organomegaly Extremities: extremities normal,  atraumatic, no cyanosis or edema Neurologic: Grossly normal   LABORATORY DATA: Lab Results  Component Value Date   WBC 8.8 11/22/2012   HGB 13.2 11/22/2012   HCT 37.8 11/22/2012   MCV 92.8 11/22/2012   PLT 259 11/22/2012      Chemistry      Component Value Date/Time   NA 141 11/22/2012 1253   NA 139 06/23/2011 0925   K 3.9 11/22/2012 1253   K 4.4 06/23/2011 0925   CL 101 03/31/2012 1250   CL 105 06/23/2011 0925   CO2 28 11/22/2012 1253   CO2 25 06/23/2011 0925   BUN 12.2 11/22/2012 1253   BUN 13 06/23/2011 0925   CREATININE 0.8 11/22/2012 1253   CREATININE 0.90 06/23/2011 0925      Component Value Date/Time   CALCIUM 9.5 11/22/2012 1253   CALCIUM 9.5 06/23/2011 0925   ALKPHOS 65 11/22/2012 1253   ALKPHOS 106 06/23/2011 0925   AST 25 11/22/2012 1253   AST 17 06/23/2011 0925   ALT 29 11/22/2012 1253   ALT 12 06/23/2011 0925   BILITOT 0.30 11/22/2012 1253   BILITOT 0.3 06/23/2011 0925       RADIOGRAPHIC STUDIES:  No results found.  ASSESSMENT: 79 year old female with  #1 history of right breast cancer 1989 subsequent recurrence in the right breast in February 2013. She underwent a mastectomy for multifocal estrogen receptor positive breast cancer. She was placed on Arimidex 1 mg daily in October 2013. Overall she's tolerating it well. She has no evidence of recurrent disease   PLAN:   #1 continue Arimidex 1 mg daily.  #2 she will be seen back in 6 months time for followup.   All questions were answered. The patient knows to call the clinic with any problems, questions or concerns. We can certainly see the patient much sooner if necessary.  I spent 25 minutes counseling the patient face to face. The total time spent in the appointment was 30 minutes.    Drue Second, MD Medical/Oncology [facility] [phone] (beeper) [phone] (Office)

## 2013-05-22 ENCOUNTER — Encounter: Payer: Self-pay | Admitting: Oncology

## 2013-05-22 ENCOUNTER — Telehealth: Payer: Self-pay | Admitting: Oncology

## 2013-05-22 ENCOUNTER — Encounter (INDEPENDENT_AMBULATORY_CARE_PROVIDER_SITE_OTHER): Payer: Self-pay

## 2013-05-22 ENCOUNTER — Ambulatory Visit: Payer: 59 | Admitting: Adult Health

## 2013-05-22 ENCOUNTER — Other Ambulatory Visit (HOSPITAL_BASED_OUTPATIENT_CLINIC_OR_DEPARTMENT_OTHER): Payer: 59

## 2013-05-22 VITALS — BP 130/70 | HR 83 | Temp 98.9°F | Resp 20 | Ht 63.0 in | Wt 191.2 lb

## 2013-05-22 DIAGNOSIS — C50911 Malignant neoplasm of unspecified site of right female breast: Secondary | ICD-10-CM

## 2013-05-22 DIAGNOSIS — C50919 Malignant neoplasm of unspecified site of unspecified female breast: Secondary | ICD-10-CM

## 2013-05-22 LAB — COMPREHENSIVE METABOLIC PANEL (CC13)
ALBUMIN: 3.9 g/dL (ref 3.5–5.0)
ALT: 31 U/L (ref 0–55)
AST: 26 U/L (ref 5–34)
Alkaline Phosphatase: 52 U/L (ref 40–150)
Anion Gap: 11 mEq/L (ref 3–11)
BUN: 13.1 mg/dL (ref 7.0–26.0)
CALCIUM: 9.9 mg/dL (ref 8.4–10.4)
CHLORIDE: 103 meq/L (ref 98–109)
CO2: 28 mEq/L (ref 22–29)
Creatinine: 1 mg/dL (ref 0.6–1.1)
Glucose: 133 mg/dl (ref 70–140)
POTASSIUM: 3.9 meq/L (ref 3.5–5.1)
Sodium: 142 mEq/L (ref 136–145)
Total Bilirubin: 0.46 mg/dL (ref 0.20–1.20)
Total Protein: 6.7 g/dL (ref 6.4–8.3)

## 2013-05-22 LAB — CBC WITH DIFFERENTIAL/PLATELET
BASO%: 0.2 % (ref 0.0–2.0)
BASOS ABS: 0 10*3/uL (ref 0.0–0.1)
EOS%: 1.6 % (ref 0.0–7.0)
Eosinophils Absolute: 0.2 10*3/uL (ref 0.0–0.5)
HCT: 39.3 % (ref 34.8–46.6)
HEMOGLOBIN: 13 g/dL (ref 11.6–15.9)
LYMPH#: 2 10*3/uL (ref 0.9–3.3)
LYMPH%: 20.9 % (ref 14.0–49.7)
MCH: 31.3 pg (ref 25.1–34.0)
MCHC: 33.1 g/dL (ref 31.5–36.0)
MCV: 94.5 fL (ref 79.5–101.0)
MONO#: 0.9 10*3/uL (ref 0.1–0.9)
MONO%: 9.9 % (ref 0.0–14.0)
NEUT%: 67.4 % (ref 38.4–76.8)
NEUTROS ABS: 6.4 10*3/uL (ref 1.5–6.5)
Platelets: 235 10*3/uL (ref 145–400)
RBC: 4.16 10*6/uL (ref 3.70–5.45)
RDW: 12.3 % (ref 11.2–14.5)
WBC: 9.5 10*3/uL (ref 3.9–10.3)

## 2013-05-22 NOTE — Telephone Encounter (Signed)
, °

## 2013-05-22 NOTE — Progress Notes (Signed)
OFFICE PROGRESS NOTE  CCMayra Neer, MD [address] [place] [address]  DIAGNOSIS: 81 year old female with history of breast cancer status post mastectomy in February 2013 for multifocal ER/PR positive breast cancer  PRIOR THERAPY:  #1 patient had history of breast cancer in 1989. She subsequently had a recurrence in February 2013 of the right breast. She had a mastectomy in 2013. Final pathology revealed multifocal ER positive PR positive breast cancer.  #2 patient was began on Tamoxifen 20 mg daily in October 2013.  CURRENT THERAPY: Tamoxifen 20mg  daily  INTERVAL HISTORY: [name] 80 y.o. female who is here for follow up of her right breast cancer.  She is doing well today.  She is taking Tamoxifen 20mg  daily and tolerating it well.  She denies fevers, chills, night sweats, nausea, vomiting, new pain, vaginal dryness, vaginal bleeding, vision changes, swelling, or any other concerns.    MEDICAL HISTORY: Past Medical History  Diagnosis Date  . Arthritis     low spine  . Chronic back pain   . Hypertension   . Breast cancer, right breast, recurrent 06/03/2011  . Cancer 1989    breast right  . Breast cancer 2013    right breast    ALLERGIES:  is allergic to arimidex and morphine and related.  MEDICATIONS:  Current Outpatient Prescriptions  Medication Sig Dispense Refill  . ACIPHEX 20 MG tablet Daily.      Marland Kitchen aspirin 81 MG tablet Take 81 mg by mouth daily.      Marland Kitchen atorvastatin (LIPITOR) 20 MG tablet       . AZOR 10-40 MG per tablet Daily.      . betamethasone dipropionate (DIPROLENE) 0.05 % cream Apply topically as needed.      . calcium carbonate (OS-CAL) 600 MG TABS Take 600 mg by mouth daily.      . CELEBREX 200 MG capsule       . Cholecalciferol (VITAMIN D3) 1000 UNITS CAPS Take by mouth daily.      . cyclobenzaprine (FLEXERIL) 10 MG tablet       . dexlansoprazole (DEXILANT) 60 MG capsule Take 60 mg by mouth daily.      .  fexofenadine (ALLEGRA) 60 MG tablet Take 60 mg by mouth daily.      . furosemide (LASIX) 20 MG tablet       . gabapentin (NEURONTIN) 100 MG capsule Daily.      . Ginkgo Biloba 40 MG TABS Take 60 mg by mouth daily.      . hydrochlorothiazide (MICROZIDE) 12.5 MG capsule Daily.      Marland Kitchen HYDROcodone-acetaminophen (NORCO/VICODIN) 5-325 MG per tablet       . nystatin (MYCOSTATIN) powder Apply topically 4 (four) times daily.  15 g  0  . oxybutynin (DITROPAN-XL) 5 MG 24 hr tablet       . oxybutynin (OXYTROL) 3.9 MG/24HR Place 1 patch onto the skin 2 (two) times a week.      Marland Kitchen oxyCODONE-acetaminophen (PERCOCET/ROXICET) 5-325 MG per tablet       . Polyethylene Glycol 3350 (MIRALAX PO) Take by mouth as needed.      . predniSONE (STERAPRED UNI-PAK) 5 MG TABS tablet       . sertraline (ZOLOFT) 25 MG tablet       . simvastatin (ZOCOR) 20 MG tablet Daily.      . tamoxifen (NOLVADEX) 20 MG tablet Take 1 tablet (20 mg total) by mouth daily.  90 tablet  6  . tolterodine (DETROL LA) 4 MG 24 hr capsule       . traMADol (ULTRAM) 50 MG tablet       . VESICARE 5 MG tablet       . VITAMIN E PO Take by mouth daily.       No current facility-administered medications for this visit.    SURGICAL HISTORY:  Past Surgical History  Procedure Laterality Date  . Shoulder surgery      patient does not remember date of procedure  . Abdominal hysterectomy  1974  . Joint replacement  2004, 2005    both knees   . Eye surgery      both cataracts  . Colonoscopy    . Breast surgery  1989    rt lumpectomy-axillary dissection  . Mastectomy  06/11/11    right breast    REVIEW OF SYSTEMS:  A 10 point review of systems was conducted and is otherwise negative except for what is noted above.     HEALTH MAINTENANCE:  Mammogram: 12/2011, scheduled one Colonoscopy: never Bone Density Scan: 01/2011 Pap Smear: s/p TAH, no BSO Eye Exam: due now, patient agrees to schedule Vitamin D Level: 03/2012, 44 Lipid Panel: 2014      PHYSICAL EXAMINATION: Blood pressure 130/70, pulse 83, temperature 98.9 F (37.2 C), temperature source Oral, resp. rate 20, height 5\' 3"  (1.6 m), weight 191 lb 3.2 oz (86.728 kg). Body mass index is 33.88 kg/(m^2). GENERAL: Patient is a well appearing female in no acute distress HEENT:  Sclerae anicteric.  Oropharynx clear and moist. No ulcerations or evidence of oropharyngeal candidiasis. Neck is supple.  NODES:  No cervical, supraclavicular, or axillary lymphadenopathy palpated.  BREAST EXAM:  Right mastectomy site intact, no skin changes, nodularity, or sign of recurrence, left breast no masses, nipple changes, or skin changes, benign breast exam.  LUNGS:  Clear to auscultation bilaterally.  No wheezes or rhonchi. HEART:  Regular rate and rhythm. No murmur appreciated. ABDOMEN:  Soft, nontender.  Positive, normoactive bowel sounds. No organomegaly palpated. MSK:  No focal spinal tenderness to palpation. Full range of motion bilaterally in the upper extremities. EXTREMITIES:  No peripheral edema.   SKIN:  Clear with no obvious rashes or skin changes. No nail dyscrasia. NEURO:  Nonfocal. Well oriented.  Appropriate affect. ECOG PERFORMANCE STATUS: 0 - Asymptomatic  LABORATORY DATA: Lab Results  Component Value Date   WBC 9.5 05/22/2013   HGB 13.0 05/22/2013   HCT 39.3 05/22/2013   MCV 94.5 05/22/2013   PLT 235 05/22/2013      Chemistry      Component Value Date/Time   NA 141 11/22/2012 1253   NA 139 06/23/2011 0925   K 3.9 11/22/2012 1253   K 4.4 06/23/2011 0925   CL 101 03/31/2012 1250   CL 105 06/23/2011 0925   CO2 28 11/22/2012 1253   CO2 25 06/23/2011 0925   BUN 12.2 11/22/2012 1253   BUN 13 06/23/2011 0925   CREATININE 0.8 11/22/2012 1253   CREATININE 0.90 06/23/2011 0925      Component Value Date/Time   CALCIUM 9.5 11/22/2012 1253   CALCIUM 9.5 06/23/2011 0925   ALKPHOS 65 11/22/2012 1253   ALKPHOS 106 06/23/2011 0925   AST 25 11/22/2012 1253   AST 17 06/23/2011 0925   ALT 29  11/22/2012 1253   ALT 12 06/23/2011 0925   BILITOT 0.30 11/22/2012 1253   BILITOT 0.3 06/23/2011 0925       RADIOGRAPHIC  STUDIES:  No results found.  ASSESSMENT: 80 year old female with  #1 history of right breast cancer 1989 subsequent recurrence in the right breast in February 2013. She underwent a mastectomy for multifocal estrogen receptor positive breast cancer. She was placed on Arimidex 1 mg daily in October 2013. Overall she's tolerating it well. She has no evidence of recurrent disease   PLAN:   #1 Patient is doing well today.  She is taking Tamoxifen daily and tolerating it well.  She has no sign of recurrence.  She will continue this medication daily.    #2 We reviewed her health maintenance above.  I recommended she continue to follow with her PCP, have her eye exams, have annual mammograms, and a healthy diet and exercise.  We discussed survivorship and monthly self breast exams.    #3  She will return in 6 months for labs, and follow up.    All questions were answered. The patient knows to call the clinic with any problems, questions or concerns. We can certainly see the patient much sooner if necessary.  I spent 25 minutes counseling the patient face to face. The total time spent in the appointment was 30 minutes.  Minette Headland, [place] [phone]

## 2013-05-22 NOTE — Patient Instructions (Signed)
Doing well.  Continue taking your Tamoxifen every day.  You have no sign of recurrence.  Continue with a healthy diet and exercise as tolerated.  Have annual mammograms, see your PCP at least annually.  I would recommend you have your cholesterol checked and your bone density checked at your next appointment with your primary care doctor.  You should have your eyes examined every year.  Please call us if you have any questions or concerns.    We will see you in 6 months.

## 2013-06-09 ENCOUNTER — Ambulatory Visit: Payer: Medicare Other

## 2013-09-13 ENCOUNTER — Telehealth: Payer: Self-pay | Admitting: Hematology and Oncology

## 2013-09-13 NOTE — Telephone Encounter (Signed)
pt's dtr [name] called to r/s 8/20 appt to 8/18. former [facility]ison pt r/s from 8/20 to 8/18 w/VG. dtr needed moms appt to be on the same day as hers w/GM. pt moved her 6/10 GM appt to 8/18.

## 2013-09-21 ENCOUNTER — Encounter (INDEPENDENT_AMBULATORY_CARE_PROVIDER_SITE_OTHER): Payer: Self-pay

## 2013-09-21 ENCOUNTER — Ambulatory Visit
Admission: RE | Admit: 2013-09-21 | Discharge: 2013-09-21 | Disposition: A | Discharge status: Home / Self Care | Payer: Medicare Other | Source: Ambulatory Visit | Attending: Adult Health | Admitting: Adult Health

## 2013-09-21 DIAGNOSIS — C50911 Malignant neoplasm of unspecified site of right female breast: Secondary | ICD-10-CM

## 2013-11-14 ENCOUNTER — Telehealth: Payer: Self-pay | Admitting: Hematology and Oncology

## 2013-11-14 NOTE — Telephone Encounter (Signed)
Returned pt daughter shelia call to r/s mohter appt-r/s & gave daughter time & date-daughter understood

## 2013-11-28 ENCOUNTER — Other Ambulatory Visit: Payer: Medicare Other

## 2013-11-28 ENCOUNTER — Ambulatory Visit: Payer: Medicare Other | Admitting: Hematology and Oncology

## 2013-11-30 ENCOUNTER — Ambulatory Visit: Payer: Medicare Other | Admitting: Oncology

## 2013-11-30 ENCOUNTER — Other Ambulatory Visit: Payer: Medicare Other

## 2013-12-04 ENCOUNTER — Other Ambulatory Visit: Payer: Self-pay | Admitting: *Deleted

## 2013-12-04 DIAGNOSIS — C50911 Malignant neoplasm of unspecified site of right female breast: Secondary | ICD-10-CM

## 2013-12-05 ENCOUNTER — Encounter: Payer: Self-pay | Admitting: Hematology and Oncology

## 2013-12-05 ENCOUNTER — Other Ambulatory Visit (HOSPITAL_BASED_OUTPATIENT_CLINIC_OR_DEPARTMENT_OTHER): Payer: Medicare Other

## 2013-12-05 ENCOUNTER — Ambulatory Visit (HOSPITAL_BASED_OUTPATIENT_CLINIC_OR_DEPARTMENT_OTHER): Payer: Medicare Other | Admitting: Hematology and Oncology

## 2013-12-05 ENCOUNTER — Telehealth: Payer: Self-pay | Admitting: Hematology and Oncology

## 2013-12-05 VITALS — BP 133/47 | HR 77 | Temp 98.5°F | Resp 18 | Ht 63.0 in | Wt 189.6 lb

## 2013-12-05 DIAGNOSIS — C50919 Malignant neoplasm of unspecified site of unspecified female breast: Secondary | ICD-10-CM

## 2013-12-05 DIAGNOSIS — C50911 Malignant neoplasm of unspecified site of right female breast: Secondary | ICD-10-CM

## 2013-12-05 DIAGNOSIS — Z170 Estrogen receptor positive status [ER+]: Secondary | ICD-10-CM

## 2013-12-05 LAB — CBC WITH DIFFERENTIAL/PLATELET
BASO%: 0.7 % (ref 0.0–2.0)
Basophils Absolute: 0.1 10*3/uL (ref 0.0–0.1)
EOS ABS: 0.1 10*3/uL (ref 0.0–0.5)
EOS%: 1.1 % (ref 0.0–7.0)
HCT: 38.5 % (ref 34.8–46.6)
HEMOGLOBIN: 12.6 g/dL (ref 11.6–15.9)
LYMPH#: 2.2 10*3/uL (ref 0.9–3.3)
LYMPH%: 24.6 % (ref 14.0–49.7)
MCH: 31.3 pg (ref 25.1–34.0)
MCHC: 32.6 g/dL (ref 31.5–36.0)
MCV: 96 fL (ref 79.5–101.0)
MONO#: 0.7 10*3/uL (ref 0.1–0.9)
MONO%: 7.9 % (ref 0.0–14.0)
NEUT%: 65.7 % (ref 38.4–76.8)
NEUTROS ABS: 6 10*3/uL (ref 1.5–6.5)
Platelets: 246 10*3/uL (ref 145–400)
RBC: 4.01 10*6/uL (ref 3.70–5.45)
RDW: 12.2 % (ref 11.2–14.5)
WBC: 9 10*3/uL (ref 3.9–10.3)

## 2013-12-05 LAB — COMPREHENSIVE METABOLIC PANEL (CC13)
ALBUMIN: 3.8 g/dL (ref 3.5–5.0)
ALK PHOS: 58 U/L (ref 40–150)
ALT: 20 U/L (ref 0–55)
AST: 19 U/L (ref 5–34)
Anion Gap: 9 mEq/L (ref 3–11)
BUN: 16.3 mg/dL (ref 7.0–26.0)
CHLORIDE: 105 meq/L (ref 98–109)
CO2: 26 mEq/L (ref 22–29)
Calcium: 9.3 mg/dL (ref 8.4–10.4)
Creatinine: 1 mg/dL (ref 0.6–1.1)
GLUCOSE: 169 mg/dL — AB (ref 70–140)
POTASSIUM: 4.1 meq/L (ref 3.5–5.1)
Sodium: 141 mEq/L (ref 136–145)
Total Bilirubin: 0.28 mg/dL (ref 0.20–1.20)
Total Protein: 6.9 g/dL (ref 6.4–8.3)

## 2013-12-05 NOTE — Telephone Encounter (Signed)
[facility] PT APPT SCHEDULE FOR FEB 2016

## 2013-12-05 NOTE — Assessment & Plan Note (Signed)
Recurrent right breast cancer.: Status post right breast mastectomy found to have multifocal invasive breast cancer 1.5 cm and 0.4 cm ER/PR positive HER-2/neu negative. Currently on antiestrogen therapy with tamoxifen tolerating it very well without any major problems. She reports no issues with hot flashes or myalgias. Breast exam today was normal I reviewed the mammograms done in June 2015 which were also normal on the left breast. I discussed with the patient and her surveillance would be with annual mammograms every 6 months breast exams. Her blood work was reviewed and there were no abnormalities noted to be of any significance.  Survivorship: Recommended that the patient exercises by walking at least 30 minutes 4-5 times a week. I recommended eating less red meat and increasing fruits and vegetables.  All of her questions have been answered she will come back in 6 months for routine followup.

## 2013-12-05 NOTE — Progress Notes (Signed)
Patient Care Team: Mayra Neer, MD as PCP - General (Family Medicine) Haywood Lasso, MD (General Surgery) Rexene Edison, MD (Radiation Oncology)  DIAGNOSIS: Breast cancer, right breast, recurrent   Primary site: Breast   Staging method: AJCC 7th Edition   Pathologic: Stage IA (T1c, N0, cM0) signed by Rulon Eisenmenger, MD on 12/05/2013  2:36 PM   Summary: Stage IA (T1c, N0, cM0)   SUMMARY OF ONCOLOGIC HISTORY:   Breast cancer, right breast, recurrent   06/03/1987 Initial Diagnosis original Breast cancer diagnosed 1989, patient had lumpectomy and XRT followed by tamoxifen for 5 years   06/03/2011 Relapse/Recurrence Recurrance in right breast ER/PR positive HER-2 negative   06/08/2011 Surgery Right breast mastectomy, multifocal breast cancer ER 100%/PR 71%; Ki 67: 28%; Her 2 Neg ratio 1.48; 1.5 cm and 0.4 cm T1 C. N0 M0 stage IA   02/05/2012 -  Anti-estrogen oral therapy Tamoxifen 20 mg daily started October 2013    CHIEF COMPLIANT: Followup of breast cancer  INTERVAL HISTORY: [name] is 80 year old Caucasian lady with above-mentioned history of recurrent right-sided breast cancer she is here for six-month followup visit.  she reports no problems tolerating tamoxifen. She had mammograms done recently which were normal. She denies any lumps or nodules. She is very independent and is here by herself. She denies any problems with activities of daily living. Denies any hot flashes or myalgias related to tamoxifen.    REVIEW OF SYSTEMS:   Constitutional: Denies fevers, chills or abnormal weight loss Eyes: Denies blurriness of vision Ears, nose, mouth, throat, and face: Denies mucositis or sore throat Respiratory: Denies cough, dyspnea or wheezes Cardiovascular: Denies palpitation, chest discomfort or lower extremity swelling Gastrointestinal:  Denies nausea, heartburn or change in bowel habits Skin: Denies abnormal skin rashes Lymphatics: Denies new lymphadenopathy or easy  bruising Neurological:Denies numbness, tingling or new weaknesses Behavioral/Psych: Mood is stable, no new changes  Breast: denies any pain or lumps or nodules  All other systems were reviewed with the patient and are negative.  I have reviewed the past medical history, past surgical history, social history and family history with the patient and they are unchanged from previous note.  ALLERGIES:  is allergic to arimidex and morphine and related.  MEDICATIONS:  Current Outpatient Prescriptions  Medication Sig Dispense Refill  . ACIPHEX 20 MG tablet Daily.      Marland Kitchen aspirin 81 MG tablet Take 81 mg by mouth daily.      Marland Kitchen atorvastatin (LIPITOR) 20 MG tablet       . AZOR 10-40 MG per tablet Daily.      . betamethasone dipropionate (DIPROLENE) 0.05 % cream Apply topically as needed.      . calcium carbonate (OS-CAL) 600 MG TABS Take 600 mg by mouth daily.      . CELEBREX 200 MG capsule       . Cholecalciferol (VITAMIN D3) 1000 UNITS CAPS Take by mouth daily.      . cyclobenzaprine (FLEXERIL) 10 MG tablet       . dexlansoprazole (DEXILANT) 60 MG capsule Take 60 mg by mouth daily.      . fexofenadine (ALLEGRA) 60 MG tablet Take 60 mg by mouth daily.      . furosemide (LASIX) 20 MG tablet       . gabapentin (NEURONTIN) 100 MG capsule Daily.      . Ginkgo Biloba 40 MG TABS Take 60 mg by mouth daily.      . hydrochlorothiazide (MICROZIDE) 12.5 MG  capsule Daily.      Marland Kitchen HYDROcodone-acetaminophen (NORCO/VICODIN) 5-325 MG per tablet       . nystatin (MYCOSTATIN) powder Apply topically 4 (four) times daily.  15 g  0  . oxybutynin (DITROPAN-XL) 5 MG 24 hr tablet       . oxybutynin (OXYTROL) 3.9 MG/24HR Place 1 patch onto the skin 2 (two) times a week.      . Polyethylene Glycol 3350 (MIRALAX PO) Take by mouth as needed.      . predniSONE (STERAPRED UNI-PAK) 5 MG TABS tablet       . sertraline (ZOLOFT) 25 MG tablet       . simvastatin (ZOCOR) 20 MG tablet Daily.      . tamoxifen (NOLVADEX) 20 MG  tablet Take 1 tablet (20 mg total) by mouth daily.  90 tablet  6  . tolterodine (DETROL LA) 4 MG 24 hr capsule       . traMADol (ULTRAM) 50 MG tablet       . VESICARE 5 MG tablet       . VITAMIN E PO Take by mouth daily.       No current facility-administered medications for this visit.    PHYSICAL EXAMINATION: ECOG PERFORMANCE STATUS: 0 - Asymptomatic  Filed Vitals:   12/05/13 1417  BP: 133/47  Pulse: 77  Temp: 98.5 F (36.9 C)  Resp: 18   Filed Weights   12/05/13 1417  Weight: 189 lb 9.6 oz (86.002 kg)    GENERAL:alert, no distress and comfortable SKIN: skin color, texture, turgor are normal, no rashes or significant lesions EYES: normal, Conjunctiva are pink and non-injected, sclera clear OROPHARYNX:no exudate, no erythema and lips, buccal mucosa, and tongue normal  NECK: supple, thyroid normal size, non-tender, without nodularity LYMPH:  no palpable lymphadenopathy in the cervical, axillary or inguinal LUNGS: clear to auscultation and percussion with normal breathing effort HEART: regular rate & rhythm and no murmurs and no lower extremity edema ABDOMEN:abdomen soft, non-tender and normal bowel sounds Musculoskeletal:no cyanosis of digits and no clubbing  NEURO: alert & oriented x 3 with fluent speech, no focal motor/sensory deficits BREAST: No palpable masses or nodules. No palpable axillary supraclavicular or infraclavicular adenopathy no breast tenderness or nipple discharge.   LABORATORY DATA:  I have reviewed the data as listed Appointment on 12/05/2013  Component Date Value Ref Range Status  . WBC 12/05/2013 9.0  3.9 - 10.3 10e3/uL Final  . NEUT# 12/05/2013 6.0  1.5 - 6.5 10e3/uL Final  . HGB 12/05/2013 12.6  11.6 - 15.9 g/dL Final  . HCT 12/05/2013 38.5  34.8 - 46.6 % Final  . Platelets 12/05/2013 246  145 - 400 10e3/uL Final  . MCV 12/05/2013 96.0  79.5 - 101.0 fL Final  . MCH 12/05/2013 31.3  25.1 - 34.0 pg Final  . MCHC 12/05/2013 32.6  31.5 - 36.0 g/dL  Final  . RBC 12/05/2013 4.01  3.70 - 5.45 10e6/uL Final  . RDW 12/05/2013 12.2  11.2 - 14.5 % Final  . lymph# 12/05/2013 2.2  0.9 - 3.3 10e3/uL Final  . MONO# 12/05/2013 0.7  0.1 - 0.9 10e3/uL Final  . Eosinophils Absolute 12/05/2013 0.1  0.0 - 0.5 10e3/uL Final  . Basophils Absolute 12/05/2013 0.1  0.0 - 0.1 10e3/uL Final  . NEUT% 12/05/2013 65.7  38.4 - 76.8 % Final  . LYMPH% 12/05/2013 24.6  14.0 - 49.7 % Final  . MONO% 12/05/2013 7.9  0.0 - 14.0 % Final  . EOS%  12/05/2013 1.1  0.0 - 7.0 % Final  . BASO% 12/05/2013 0.7  0.0 - 2.0 % Final  . Sodium 12/05/2013 141  136 - 145 mEq/L Final  . Potassium 12/05/2013 4.1  3.5 - 5.1 mEq/L Final  . Chloride 12/05/2013 105  98 - 109 mEq/L Final  . CO2 12/05/2013 26  22 - 29 mEq/L Final  . Glucose 12/05/2013 169* 70 - 140 mg/dl Final  . BUN 12/05/2013 16.3  7.0 - 26.0 mg/dL Final  . Creatinine 12/05/2013 1.0  0.6 - 1.1 mg/dL Final  . Total Bilirubin 12/05/2013 0.28  0.20 - 1.20 mg/dL Final  . Alkaline Phosphatase 12/05/2013 58  40 - 150 U/L Final  . AST 12/05/2013 19  5 - 34 U/L Final  . ALT 12/05/2013 20  0 - 55 U/L Final  . Total Protein 12/05/2013 6.9  6.4 - 8.3 g/dL Final  . Albumin 12/05/2013 3.8  3.5 - 5.0 g/dL Final  . Calcium 12/05/2013 9.3  8.4 - 10.4 mg/dL Final  . Anion Gap 12/05/2013 9  3 - 11 mEq/L Final    RADIOGRAPHIC STUDIES: mammogram done June 2015 on the left breast revealed no abnormalities one-year followup recommended     ASSESSMENT & PLAN:  Breast cancer, right breast, recurrent Recurrent right breast cancer.: Status post right breast mastectomy found to have multifocal invasive breast cancer 1.5 cm and 0.4 cm ER/PR positive HER-2/neu negative. Currently on antiestrogen therapy with tamoxifen tolerating it very well without any major problems. She reports no issues with hot flashes or myalgias. Breast exam today was normal I reviewed the mammograms done in June 2015 which were also normal on the left breast. I  discussed with the patient and her surveillance would be with annual mammograms every 6 months breast exams. Her blood work was reviewed and there were no abnormalities noted to be of any significance.  Survivorship: Recommended that the patient exercises by walking at least 30 minutes 4-5 times a week. I recommended eating less red meat and increasing fruits and vegetables.  All of her questions have been answered she will come back in 6 months for routine followup.   No orders of the defined types were placed in this encounter.   The patient has a good understanding of the overall plan. she agrees with it. She will call with any problems that may develop before her next visit here.  I spent 25 minutes counseling the patient face to face. The total time spent in the appointment was 30 minutes and more than 50% was on counseling and review of test results    Rulon Eisenmenger, MD 12/05/2013 2:38 PM

## 2013-12-15 ENCOUNTER — Other Ambulatory Visit: Payer: Self-pay | Admitting: *Deleted

## 2013-12-15 DIAGNOSIS — C50911 Malignant neoplasm of unspecified site of right female breast: Secondary | ICD-10-CM

## 2013-12-15 MED ORDER — TAMOXIFEN CITRATE 20 MG PO TABS: 20.0000 mg | ORAL_TABLET | Freq: Every day | ORAL | Status: DC

## 2014-06-05 ENCOUNTER — Ambulatory Visit: Payer: Medicare Other | Admitting: Hematology and Oncology

## 2014-06-05 NOTE — Assessment & Plan Note (Signed)
Recurrent right breast cancer.: Status post right breast mastectomy found to have multifocal invasive breast cancer 1.5 cm and 0.4 cm ER/PR positive HER-2/neu negative. Currently on antiestrogen therapy with tamoxifen tolerating it very well without any major problems.  Breast cancer surveillance: 1. Mammogram June 2015 is normal 2. Breast exam 12/05/2013 is normal  Survivorship:Discussed the importance of physical exercise in decreasing the likelihood of breast cancer recurrence. Recommended 30 mins daily 6 days a week of either brisk walking or cycling or swimming. Encouraged patient to eat more fruits and vegetables and decrease red meat.

## 2014-07-13 ENCOUNTER — Other Ambulatory Visit: Payer: Self-pay | Admitting: Hematology and Oncology

## 2014-07-13 DIAGNOSIS — C50911 Malignant neoplasm of unspecified site of right female breast: Secondary | ICD-10-CM

## 2014-12-11 ENCOUNTER — Other Ambulatory Visit: Payer: Self-pay

## 2014-12-11 DIAGNOSIS — Z1231 Encounter for screening mammogram for malignant neoplasm of breast: Secondary | ICD-10-CM

## 2014-12-21 ENCOUNTER — Ambulatory Visit
Admission: RE | Admit: 2014-12-21 | Discharge: 2014-12-21 | Disposition: A | Discharge status: Home / Self Care | Payer: Medicare Other | Source: Ambulatory Visit

## 2014-12-21 DIAGNOSIS — Z1231 Encounter for screening mammogram for malignant neoplasm of breast: Secondary | ICD-10-CM

## 2015-01-11 ENCOUNTER — Other Ambulatory Visit: Payer: Self-pay | Admitting: Hematology and Oncology

## 2015-01-11 DIAGNOSIS — C50911 Malignant neoplasm of unspecified site of right female breast: Secondary | ICD-10-CM

## 2015-12-06 ENCOUNTER — Other Ambulatory Visit: Payer: Self-pay | Admitting: Hematology and Oncology

## 2015-12-06 DIAGNOSIS — C50911 Malignant neoplasm of unspecified site of right female breast: Secondary | ICD-10-CM

## 2015-12-06 NOTE — Telephone Encounter (Signed)
Chart reviewed.

## 2015-12-18 ENCOUNTER — Other Ambulatory Visit: Payer: Self-pay | Admitting: Family Medicine

## 2015-12-18 DIAGNOSIS — Z1231 Encounter for screening mammogram for malignant neoplasm of breast: Secondary | ICD-10-CM

## 2015-12-27 ENCOUNTER — Ambulatory Visit
Admission: RE | Admit: 2015-12-27 | Discharge: 2015-12-27 | Disposition: A | Discharge status: Home / Self Care | Payer: Medicare Other | Source: Ambulatory Visit | Attending: Family Medicine | Admitting: Family Medicine

## 2015-12-27 DIAGNOSIS — Z1231 Encounter for screening mammogram for malignant neoplasm of breast: Secondary | ICD-10-CM

## 2016-02-25 ENCOUNTER — Other Ambulatory Visit: Payer: Self-pay

## 2016-02-25 MED ORDER — TAMOXIFEN CITRATE 20 MG PO TABS: 20.0000 mg | ORAL_TABLET | Freq: Every day | ORAL | 0 refills | Status: DC

## 2016-04-14 ENCOUNTER — Telehealth: Payer: Self-pay | Admitting: *Deleted

## 2016-04-14 NOTE — Telephone Encounter (Signed)
Daughter states pt needs Rx signed by Dr. Lindi Adie for breast prostheticsbra.  Pt has not seen Dr. Lindi Adie in a few years, but daughter says that [facility] says he is the one that needs to sign Rx.  I asked her to have [facility] fax over a Rx to Dr. Geralyn Flash nurse fax line.  If Dr. Lindi Adie needs to see pt again to sign Rx then will have nurse call her back.  Otherwise they will fax Rx back to the store. She verbalized understanding.

## 2016-05-08 ENCOUNTER — Other Ambulatory Visit: Payer: Self-pay

## 2016-05-08 MED ORDER — TAMOXIFEN CITRATE 20 MG PO TABS
20.0000 mg | ORAL_TABLET | Freq: Every day | ORAL | 0 refills | Status: DC
Start: 2016-05-08 — End: 2016-09-13

## 2016-05-22 ENCOUNTER — Other Ambulatory Visit: Payer: Self-pay | Admitting: Emergency Medicine

## 2016-05-22 DIAGNOSIS — C50911 Malignant neoplasm of unspecified site of right female breast: Secondary | ICD-10-CM

## 2016-05-25 ENCOUNTER — Other Ambulatory Visit (HOSPITAL_BASED_OUTPATIENT_CLINIC_OR_DEPARTMENT_OTHER): Payer: Medicare Other

## 2016-05-25 ENCOUNTER — Ambulatory Visit (HOSPITAL_BASED_OUTPATIENT_CLINIC_OR_DEPARTMENT_OTHER): Payer: Medicare Other | Admitting: Hematology and Oncology

## 2016-05-25 ENCOUNTER — Encounter: Payer: Self-pay | Admitting: Hematology and Oncology

## 2016-05-25 DIAGNOSIS — C50911 Malignant neoplasm of unspecified site of right female breast: Secondary | ICD-10-CM

## 2016-05-25 DIAGNOSIS — Z170 Estrogen receptor positive status [ER+]: Secondary | ICD-10-CM

## 2016-05-25 DIAGNOSIS — Z79810 Long term (current) use of selective estrogen receptor modulators (SERMs): Secondary | ICD-10-CM | POA: Diagnosis not present

## 2016-05-25 LAB — COMPREHENSIVE METABOLIC PANEL
ALBUMIN: 3.6 g/dL (ref 3.5–5.0)
ALT: 17 U/L (ref 0–55)
ANION GAP: 9 meq/L (ref 3–11)
AST: 18 U/L (ref 5–34)
Alkaline Phosphatase: 67 U/L (ref 40–150)
BILIRUBIN TOTAL: 0.36 mg/dL (ref 0.20–1.20)
BUN: 12.3 mg/dL (ref 7.0–26.0)
CO2: 28 mEq/L (ref 22–29)
CREATININE: 0.9 mg/dL (ref 0.6–1.1)
Calcium: 9.5 mg/dL (ref 8.4–10.4)
Chloride: 105 mEq/L (ref 98–109)
EGFR: 57 mL/min/{1.73_m2} — AB (ref >90)
Glucose: 219 mg/dl — ABNORMAL HIGH (ref 70–140)
Potassium: 3.8 mEq/L (ref 3.5–5.1)
Sodium: 142 mEq/L (ref 136–145)
TOTAL PROTEIN: 6.8 g/dL (ref 6.4–8.3)

## 2016-05-25 LAB — CBC WITH DIFFERENTIAL/PLATELET
BASO%: 0.5 % (ref 0.0–2.0)
Basophils Absolute: 0 10*3/uL (ref 0.0–0.1)
EOS ABS: 0.1 10*3/uL (ref 0.0–0.5)
EOS%: 0.9 % (ref 0.0–7.0)
HCT: 37.6 % (ref 34.8–46.6)
HEMOGLOBIN: 12.5 g/dL (ref 11.6–15.9)
LYMPH%: 21 % (ref 14.0–49.7)
MCH: 31.7 pg (ref 25.1–34.0)
MCHC: 33.2 g/dL (ref 31.5–36.0)
MCV: 95.4 fL (ref 79.5–101.0)
MONO#: 0.9 10*3/uL (ref 0.1–0.9)
MONO%: 9.6 % (ref 0.0–14.0)
NEUT%: 68 % (ref 38.4–76.8)
NEUTROS ABS: 6.3 10*3/uL (ref 1.5–6.5)
PLATELETS: 205 10*3/uL (ref 145–400)
RBC: 3.95 10*6/uL (ref 3.70–5.45)
RDW: 13 % (ref 11.2–14.5)
WBC: 9.3 10*3/uL (ref 3.9–10.3)
lymph#: 1.9 10*3/uL (ref 0.9–3.3)

## 2016-05-25 NOTE — Assessment & Plan Note (Signed)
Recurrent right breast cancer.: Status post right breast mastectomy found to have multifocal invasive breast cancer 1.5 cm and 0.4 cm ER/PR positive HER-2/neu negative.   Current treatment: Antiestrogen therapy with tamoxifen tolerating it very well without any major problems started 02/05/2012.   Surveillance:  1. Breast exam today was normal  2. Mammograms 12/27/2015: Breast density category B, benign  Survivorship: Recommended that the patient exercises by walking at least 30 minutes 4-5 times a week. I recommended eating less red meat and increasing fruits and vegetables.  All of her questions have been answered she will come back in one year for routine followup.

## 2016-05-25 NOTE — Progress Notes (Signed)
Patient Care Team: Mayra Neer, MD as PCP - General (Family Medicine) Neldon Mc, MD (General Surgery) Arloa Koh, MD (Radiation Oncology)  DIAGNOSIS:  Encounter Diagnosis  Name Primary?  . Malignant neoplasm of right breast in female, estrogen receptor positive, unspecified site of breast ([place])     SUMMARY OF ONCOLOGIC HISTORY:   Breast cancer, right breast, recurrent   06/03/1987 Initial Diagnosis    original Breast cancer diagnosed 1989, patient had lumpectomy and XRT followed by tamoxifen for 5 years      06/03/2011 Relapse/Recurrence    Recurrance in right breast ER/PR positive HER-2 negative      06/08/2011 Surgery    Right breast mastectomy, multifocal breast cancer ER 100%/PR 71%; Ki 67: 28%; Her 2 Neg ratio 1.48; 1.5 cm and 0.4 cm T1 C. N0 M0 stage IA      02/05/2012 -  Anti-estrogen oral therapy    Tamoxifen 20 mg daily started October 2013      CHIEF COMPLIANT: Follow-up on tamoxifen therapy  INTERVAL HISTORY: [name] is a 83 year old with above-mentioned history of recurrent breast cancer who is currently on tamoxifen. She will finish 5 years of therapy by October 2018. She reports no problems taking tamoxifen. She would like to come off tamoxifen if possible when 5 years of complete. She denies any lumps or nodules in the breast.  REVIEW OF SYSTEMS:   Constitutional: Denies fevers, chills or abnormal weight loss Eyes: Denies blurriness of vision Ears, nose, mouth, throat, and face: Denies mucositis or sore throat Respiratory: Denies cough, dyspnea or wheezes Cardiovascular: Denies palpitation, chest discomfort Gastrointestinal:  Denies nausea, heartburn or change in bowel habits Skin: Denies abnormal skin rashes Lymphatics: Denies new lymphadenopathy or easy bruising Neurological:Denies numbness, tingling or new weaknesses Behavioral/Psych: Mood is stable, no new changes  Extremities: No lower extremity edema Breast:  denies any pain or  lumps or nodules in either breasts All other systems were reviewed with the patient and are negative.  I have reviewed the past medical history, past surgical history, social history and family history with the patient and they are unchanged from previous note.  ALLERGIES:  is allergic to arimidex [anastrozole] and morphine and related.  MEDICATIONS:  Current Outpatient Prescriptions  Medication Sig Dispense Refill  . ACIPHEX 20 MG tablet Daily.    Marland Kitchen aspirin 81 MG tablet Take 81 mg by mouth daily.    Marland Kitchen atorvastatin (LIPITOR) 20 MG tablet     . AZOR 10-40 MG per tablet Daily.    . betamethasone dipropionate (DIPROLENE) 0.05 % cream Apply topically as needed.    . calcium carbonate (OS-CAL) 600 MG TABS Take 600 mg by mouth daily.    . CELEBREX 200 MG capsule     . Cholecalciferol (VITAMIN D3) 1000 UNITS CAPS Take by mouth daily.    . cyclobenzaprine (FLEXERIL) 10 MG tablet     . dexlansoprazole (DEXILANT) 60 MG capsule Take 60 mg by mouth daily.    . fexofenadine (ALLEGRA) 60 MG tablet Take 60 mg by mouth daily.    . furosemide (LASIX) 20 MG tablet     . gabapentin (NEURONTIN) 100 MG capsule Daily.    . Ginkgo Biloba 40 MG TABS Take 60 mg by mouth daily.    . hydrochlorothiazide (MICROZIDE) 12.5 MG capsule Daily.    Marland Kitchen HYDROcodone-acetaminophen (NORCO/VICODIN) 5-325 MG per tablet     . nystatin (MYCOSTATIN) powder Apply topically 4 (four) times daily. 15 g 0  . oxybutynin (DITROPAN-XL)  5 MG 24 hr tablet     . oxybutynin (OXYTROL) 3.9 MG/24HR Place 1 patch onto the skin 2 (two) times a week.    . Polyethylene Glycol 3350 (MIRALAX PO) Take by mouth as needed.    . predniSONE (STERAPRED UNI-PAK) 5 MG TABS tablet     . sertraline (ZOLOFT) 25 MG tablet     . simvastatin (ZOCOR) 20 MG tablet Daily.    . tamoxifen (NOLVADEX) 20 MG tablet Take 1 tablet (20 mg total) by mouth daily. 30 tablet 0  . tolterodine (DETROL LA) 4 MG 24 hr capsule     . traMADol (ULTRAM) 50 MG tablet     . VESICARE 5  MG tablet     . VITAMIN E PO Take by mouth daily.     No current facility-administered medications for this visit.     PHYSICAL EXAMINATION: ECOG PERFORMANCE STATUS: 0 - Asymptomatic  Vitals:   05/25/16 1112  BP: (!) 173/84  Pulse: 81  Resp: 18  Temp: 98.3 F (36.8 C)   Filed Weights   05/25/16 1112  Weight: 180 lb 11.2 oz (82 kg)    GENERAL:alert, no distress and comfortable SKIN: skin color, texture, turgor are normal, no rashes or significant lesions EYES: normal, Conjunctiva are pink and non-injected, sclera clear OROPHARYNX:no exudate, no erythema and lips, buccal mucosa, and tongue normal  NECK: supple, thyroid normal size, non-tender, without nodularity LYMPH:  no palpable lymphadenopathy in the cervical, axillary or inguinal LUNGS: clear to auscultation and percussion with normal breathing effort HEART: regular rate & rhythm and no murmurs and no lower extremity edema ABDOMEN:abdomen soft, non-tender and normal bowel sounds MUSCULOSKELETAL:no cyanosis of digits and no clubbing  NEURO: alert & oriented x 3 with fluent speech, no focal motor/sensory deficits EXTREMITIES: No lower extremity edema BREAST: No palpable masses or nodules in either right or left breasts. No palpable axillary supraclavicular or infraclavicular adenopathy no breast tenderness or nipple discharge. (exam performed in the presence of a chaperone)  LABORATORY DATA:  I have reviewed the data as listed   Chemistry      Component Value Date/Time   NA 142 05/25/2016 1055   K 3.8 05/25/2016 1055   CL 101 03/31/2012 1250   CO2 28 05/25/2016 1055   BUN 12.3 05/25/2016 1055   CREATININE 0.9 05/25/2016 1055      Component Value Date/Time   CALCIUM 9.5 05/25/2016 1055   ALKPHOS 67 05/25/2016 1055   AST 18 05/25/2016 1055   ALT 17 05/25/2016 1055   BILITOT 0.36 05/25/2016 1055       Lab Results  Component Value Date   WBC 9.3 05/25/2016   HGB 12.5 05/25/2016   HCT 37.6 05/25/2016   MCV  95.4 05/25/2016   PLT 205 05/25/2016   NEUTROABS 6.3 05/25/2016    ASSESSMENT & PLAN:  Breast cancer, right breast, recurrent Recurrent right breast cancer.: Status post right breast mastectomy found to have multifocal invasive breast cancer 1.5 cm and 0.4 cm ER/PR positive HER-2/neu negative.   Current treatment: Antiestrogen therapy with tamoxifen tolerating it very well without any major problems started 02/05/2012.   Surveillance:  1. Breast exam today was normal  2. Mammograms 12/27/2015: Breast density category B, benign  Return to clinic in 1 year All of her questions have been answered she will come back in one year for routine followup.   I spent 25 minutes talking to the patient of which more than half was spent in counseling  and coordination of care.  Orders Placed This Encounter  Procedures  . Amb Referral to Survivorship Long term    Referral Priority:   Routine    Referral Type:   Consultation    Number of Visits Requested:   1   The patient has a good understanding of the overall plan. she agrees with it. she will call with any problems that may develop before the next visit here.   Rulon Eisenmenger, MD 05/25/16

## 2016-08-31 ENCOUNTER — Ambulatory Visit
Admission: RE | Admit: 2016-08-31 | Discharge: 2016-08-31 | Disposition: A | Discharge status: Home / Self Care | Payer: Medicare Other | Source: Ambulatory Visit | Attending: Family Medicine | Admitting: Family Medicine

## 2016-08-31 ENCOUNTER — Other Ambulatory Visit: Payer: Self-pay | Admitting: Family Medicine

## 2016-08-31 DIAGNOSIS — M25431 Effusion, right wrist: Secondary | ICD-10-CM

## 2016-08-31 DIAGNOSIS — M25531 Pain in right wrist: Secondary | ICD-10-CM

## 2016-09-12 ENCOUNTER — Emergency Department (HOSPITAL_COMMUNITY): Payer: Medicare Other

## 2016-09-12 ENCOUNTER — Encounter (HOSPITAL_COMMUNITY): Payer: Self-pay

## 2016-09-12 ENCOUNTER — Observation Stay (HOSPITAL_COMMUNITY)
Admission: EM | Admit: 2016-09-12 | Discharge: 2016-09-13 | Disposition: A | Discharge status: Home / Self Care | Payer: Medicare Other | Attending: Cardiovascular Disease | Admitting: Cardiovascular Disease

## 2016-09-12 DIAGNOSIS — I495 Sick sinus syndrome: Secondary | ICD-10-CM | POA: Diagnosis not present

## 2016-09-12 DIAGNOSIS — Z79899 Other long term (current) drug therapy: Secondary | ICD-10-CM | POA: Insufficient documentation

## 2016-09-12 DIAGNOSIS — Z96653 Presence of artificial knee joint, bilateral: Secondary | ICD-10-CM | POA: Insufficient documentation

## 2016-09-12 DIAGNOSIS — Z853 Personal history of malignant neoplasm of breast: Secondary | ICD-10-CM

## 2016-09-12 DIAGNOSIS — E7800 Pure hypercholesterolemia, unspecified: Secondary | ICD-10-CM | POA: Diagnosis not present

## 2016-09-12 DIAGNOSIS — R1013 Epigastric pain: Secondary | ICD-10-CM | POA: Diagnosis present

## 2016-09-12 DIAGNOSIS — R0602 Shortness of breath: Secondary | ICD-10-CM

## 2016-09-12 DIAGNOSIS — R079 Chest pain, unspecified: Secondary | ICD-10-CM | POA: Diagnosis not present

## 2016-09-12 DIAGNOSIS — I5031 Acute diastolic (congestive) heart failure: Secondary | ICD-10-CM | POA: Diagnosis not present

## 2016-09-12 DIAGNOSIS — I4891 Unspecified atrial fibrillation: Secondary | ICD-10-CM | POA: Diagnosis not present

## 2016-09-12 DIAGNOSIS — Z87891 Personal history of nicotine dependence: Secondary | ICD-10-CM | POA: Insufficient documentation

## 2016-09-12 DIAGNOSIS — Z7901 Long term (current) use of anticoagulants: Secondary | ICD-10-CM

## 2016-09-12 DIAGNOSIS — I480 Paroxysmal atrial fibrillation: Secondary | ICD-10-CM | POA: Diagnosis not present

## 2016-09-12 DIAGNOSIS — I10 Essential (primary) hypertension: Secondary | ICD-10-CM | POA: Diagnosis not present

## 2016-09-12 LAB — CBC
HCT: 43 % (ref 36.0–46.0)
HCT: 40.7 % (ref 36.0–46.0)
Hemoglobin: 14.1 g/dL (ref 12.0–15.0)
Hemoglobin: 13.4 g/dL (ref 12.0–15.0)
MCH: 30.6 pg (ref 26.0–34.0)
MCH: 30.6 pg (ref 26.0–34.0)
MCHC: 32.8 g/dL (ref 30.0–36.0)
MCHC: 32.9 g/dL (ref 30.0–36.0)
MCV: 93.3 fL (ref 78.0–100.0)
MCV: 92.9 fL (ref 78.0–100.0)
Platelets: 285 10*3/uL (ref 150–400)
Platelets: 276 10*3/uL (ref 150–400)
RBC: 4.61 MIL/uL (ref 3.87–5.11)
RBC: 4.38 MIL/uL (ref 3.87–5.11)
RDW: 12.7 % (ref 11.5–15.5)
RDW: 12.7 % (ref 11.5–15.5)
WBC: 9.4 10*3/uL (ref 4.0–10.5)
WBC: 8.6 10*3/uL (ref 4.0–10.5)

## 2016-09-12 LAB — I-STAT TROPONIN, ED: Troponin i, poc: 0.01 ng/mL (ref 0.00–0.08)

## 2016-09-12 LAB — BASIC METABOLIC PANEL
ANION GAP: 14 (ref 5–15)
BUN: 11 mg/dL (ref 6–20)
CHLORIDE: 102 mmol/L (ref 101–111)
CO2: 23 mmol/L (ref 22–32)
Calcium: 10.1 mg/dL (ref 8.9–10.3)
Creatinine, Ser: 0.9 mg/dL (ref 0.44–1.00)
GFR calc Af Amer: >60 mL/min (ref >60)
GFR, EST NON AFRICAN AMERICAN: 55 mL/min — AB (ref >60)
GLUCOSE: 168 mg/dL — AB (ref 65–99)
Potassium: 3.7 mmol/L (ref 3.5–5.1)
SODIUM: 139 mmol/L (ref 135–145)

## 2016-09-12 LAB — TSH: TSH: 2.844 u[IU]/mL (ref 0.350–4.500)

## 2016-09-12 LAB — TROPONIN I
Troponin I: <0.03 ng/mL (ref <0.03)
Troponin I: 0.04 ng/mL (ref <0.03)

## 2016-09-12 LAB — CREATININE, SERUM
Creatinine, Ser: 0.87 mg/dL (ref 0.44–1.00)
GFR calc Af Amer: >60 mL/min (ref >60)
GFR calc non Af Amer: 57 mL/min — ABNORMAL LOW (ref >60)

## 2016-09-12 LAB — MAGNESIUM: Magnesium: 1.9 mg/dL (ref 1.7–2.4)

## 2016-09-12 MED ORDER — DILTIAZEM LOAD VIA INFUSION
10.0000 mg | Freq: Once | INTRAVENOUS | Status: AC
  Administered 2016-09-12: 10 mg via INTRAVENOUS
  Filled 2016-09-12: qty 10

## 2016-09-12 MED ORDER — DILTIAZEM HCL ER COATED BEADS 120 MG PO CP24
120.0000 mg | ORAL_CAPSULE | Freq: Every day | ORAL | Status: DC
  Administered 2016-09-12 – 2016-09-13 (×2): 120 mg via ORAL
  Filled 2016-09-12 (×2): qty 1

## 2016-09-12 MED ORDER — VITAMIN D 1000 UNITS PO TABS
1000.0000 [IU] | ORAL_TABLET | Freq: Every day | ORAL | Status: DC
  Administered 2016-09-13: 1000 [IU] via ORAL
  Filled 2016-09-12 (×2): qty 1

## 2016-09-12 MED ORDER — VITAMIN C 500 MG PO TABS
500.0000 mg | ORAL_TABLET | Freq: Every day | ORAL | Status: DC
  Administered 2016-09-13: 500 mg via ORAL
  Filled 2016-09-12 (×2): qty 1

## 2016-09-12 MED ORDER — ASPIRIN EC 81 MG PO TBEC
81.0000 mg | DELAYED_RELEASE_TABLET | Freq: Every day | ORAL | Status: DC
  Administered 2016-09-13: 81 mg via ORAL
  Filled 2016-09-12 (×2): qty 1

## 2016-09-12 MED ORDER — SODIUM CHLORIDE 0.9 % IV SOLN: 250.0000 mL | INTRAVENOUS | Status: DC | PRN

## 2016-09-12 MED ORDER — HEPARIN SODIUM (PORCINE) 5000 UNIT/ML IJ SOLN
5000.0000 [IU] | Freq: Three times a day (TID) | INTRAMUSCULAR | Status: DC
  Administered 2016-09-12 – 2016-09-13 (×2): 5000 [IU] via SUBCUTANEOUS
  Filled 2016-09-12 (×2): qty 1

## 2016-09-12 MED ORDER — GABAPENTIN 100 MG PO CAPS: 100.0000 mg | ORAL_CAPSULE | Freq: Every evening | ORAL | Status: DC | PRN

## 2016-09-12 MED ORDER — NITROGLYCERIN 0.4 MG SL SUBL: 0.4000 mg | SUBLINGUAL_TABLET | SUBLINGUAL | Status: DC | PRN

## 2016-09-12 MED ORDER — ATORVASTATIN CALCIUM 20 MG PO TABS
20.0000 mg | ORAL_TABLET | Freq: Every day | ORAL | Status: DC
  Administered 2016-09-12: 20 mg via ORAL
  Filled 2016-09-12 (×2): qty 1

## 2016-09-12 MED ORDER — CELECOXIB 200 MG PO CAPS: 200.0000 mg | ORAL_CAPSULE | Freq: Every day | ORAL | Status: DC

## 2016-09-12 MED ORDER — SERTRALINE HCL 25 MG PO TABS
25.0000 mg | ORAL_TABLET | Freq: Every day | ORAL | Status: DC
  Administered 2016-09-12: 25 mg via ORAL
  Filled 2016-09-12 (×2): qty 1

## 2016-09-12 MED ORDER — ONDANSETRON HCL 4 MG/2ML IJ SOLN: 4.0000 mg | Freq: Four times a day (QID) | INTRAMUSCULAR | Status: DC | PRN

## 2016-09-12 MED ORDER — PANTOPRAZOLE SODIUM 40 MG PO TBEC
40.0000 mg | DELAYED_RELEASE_TABLET | Freq: Every day | ORAL | Status: DC
  Administered 2016-09-13: 40 mg via ORAL
  Filled 2016-09-12 (×2): qty 1

## 2016-09-12 MED ORDER — CALCIUM CARBONATE 1500 (600 CA) MG PO TABS
1500.0000 mg | ORAL_TABLET | Freq: Every day | ORAL | Status: DC
  Administered 2016-09-13: 1500 mg via ORAL
  Filled 2016-09-12: qty 1

## 2016-09-12 MED ORDER — DILTIAZEM HCL 100 MG IV SOLR
5.0000 mg/h | INTRAVENOUS | Status: DC
Start: 2016-09-12 — End: 2016-09-12
  Administered 2016-09-12: 5 mg/h via INTRAVENOUS
  Filled 2016-09-12: qty 100

## 2016-09-12 NOTE — ED Notes (Signed)
Pt now states that she has had multiple falls recently, but denies dizziness during each fall. Pt had physical yesterday and no concerning findings other than hypertension management. Pt had BP checked last week and last week systolic 389, at her appointment yesterday pt was 148/80 and the dr told her that she did not need to be on BP meds at this point.

## 2016-09-12 NOTE — ED Notes (Signed)
Patient is stable and ready to be transport to the floor at this time.  Report was called to 3W Research scientist (medical).  Belongings taken with the patient to the floor.

## 2016-09-12 NOTE — ED Triage Notes (Signed)
Per EMS, pt woke up at 0400 with epigastric pain that has been intermittent since. Pt had near syncopal episode several hours ago associated with the pain. Pt in a-fib with rvr, has no hx of cardiac issues. BP 150/100, HR 150s, RR 20, spo2 97%, CBG 108

## 2016-09-12 NOTE — ED Provider Notes (Signed)
[facility] DEPT Provider Note   CSN: [id number] Arrival date & time: 09/12/16  1505     History   Chief Complaint Chief Complaint  Patient presents with  . Atrial Fibrillation    HPI [name] is a 83 y.o. female.  83 yo female with PMH of recurrent right breast cancer s/p mastectomy and now in remission who presents by EMS after episodes of epigastric pain found to be in Afib with RVR. Reports that around 4 am she woke up with pain in the epigastric region that radiated up her neck to her nose/mouth and down into her abdomen. Describes the pain as sharp and had a sensation of congestion in her face. She took Tylenol and pain eventually resolved. She endorsed associated SOB with the pain. Around lunchtime, she had recurrence of the pain and significant SOB. At this point EMS was called.   Was found to be in Afib with RVR. Patient did not have sensation of heart racing or beating irregularly. Denies history of Afib and has never been on anticoagulant. Has history of HTN not on medications and h/o hyperlipidemia on statin therapy. Denies other significant cardiac history. Patient reports she has never had epigastric pain like this before.   Does have history of two falls within the past three weeks. Falls sound mechanical in nature as one occurred on the curb when exiting the car and the other occurred standing up from the table where her "feet got tripped up on the chair". Denies dizziness with either of these falls.       Past Medical History:  Diagnosis Date  . Arthritis    low spine  . Breast cancer [facility]) 2013   right breast  . Breast cancer, right breast, recurrent 06/03/2011  . Cancer [facility]) 1989   breast right  . Chronic back pain   . Hypertension     Patient Active Problem List   Diagnosis Date Noted  . Breast cancer, right breast, recurrent 06/03/2011  . Personal history of malignant neoplasm of breast 04/14/1987    Past Surgical History:  Procedure  Laterality Date  . ABDOMINAL HYSTERECTOMY  1974  . BREAST SURGERY  1989   rt lumpectomy-axillary dissection  . COLONOSCOPY    . EYE SURGERY     both cataracts  . JOINT REPLACEMENT  2004, 2005   both knees   . MASTECTOMY  06/11/11   right breast  . SHOULDER SURGERY     patient does not remember date of procedure    OB History    No data available       Home Medications    Prior to Admission medications   Medication Sig Start Date End Date Taking? Authorizing Provider  ACIPHEX 20 MG tablet Take 20 mg by mouth Daily.  05/02/11  Yes [provider]  atorvastatin (LIPITOR) 20 MG tablet Take 20 mg by mouth daily.  10/21/12  Yes [provider]  calcium carbonate (OS-CAL) 600 MG TABS Take 600 mg by mouth daily.   Yes [provider]  CELEBREX 200 MG capsule Take 200 mg by mouth daily.  11/18/12  Yes [provider]  cyclobenzaprine (FLEXERIL) 10 MG tablet  10/12/12  Yes [provider]  gabapentin (NEURONTIN) 100 MG capsule Take 100-300 mg by mouth at bedtime.  04/03/11  Yes [provider]  Misc Natural Products (TART CHERRY ADVANCED) CAPS Take 1 capsule by mouth daily after breakfast.   Yes [provider]  Olmesartan-Amlodipine-HCTZ Jabier Gauss) 40-10-12.5  MG TABS Take 1 tablet by mouth every morning.   Yes [provider]  sertraline (ZOLOFT) 25 MG tablet Take 25 mg by mouth 2 (two) times daily.  04/22/13  Yes [provider]  vitamin C (ASCORBIC ACID) 500 MG tablet Take 500 mg by mouth daily.   Yes [provider]  VITAMIN E PO Take 1,000 Units by mouth daily.    Yes [provider]  betamethasone dipropionate (DIPROLENE) 0.05 % cream Apply topically as needed.    [provider]  Cholecalciferol (VITAMIN D3) 1000 UNITS CAPS Take 1,000 Units by mouth daily.     [provider]  dexlansoprazole (DEXILANT) 60 MG capsule Take 60 mg by mouth daily.    [provider]   fexofenadine (ALLEGRA) 60 MG tablet Take 60 mg by mouth daily.    [provider]  furosemide (LASIX) 20 MG tablet  09/22/12   [provider]  Ginkgo Biloba 40 MG TABS Take 60 mg by mouth daily.    [provider]  hydrochlorothiazide (MICROZIDE) 12.5 MG capsule Daily. 05/02/11   [provider]  HYDROcodone-acetaminophen (NORCO/VICODIN) 5-325 MG per tablet  09/28/12   [provider]  nystatin (MYCOSTATIN) powder Apply topically 4 (four) times daily. Patient not taking: Reported on 09/12/2016 03/31/12   Eston Esters, MD  predniSONE (STERAPRED UNI-PAK) 5 MG TABS tablet  10/20/12   [provider]  simvastatin (ZOCOR) 20 MG tablet Daily. 05/02/11   [provider]  tamoxifen (NOLVADEX) 20 MG tablet Take 1 tablet (20 mg total) by mouth daily. Patient not taking: Reported on 09/12/2016 05/08/16   Nicholas Lose, MD  tolterodine (DETROL LA) 4 MG 24 hr capsule  10/21/12   [provider]  traMADol Veatrice Bourbon) 50 MG tablet  05/31/12   [provider]  VESICARE 5 MG tablet  11/08/12   [provider]    Family History Family History  Problem Relation Age of Onset  . Heart disease Mother   . Breast cancer Maternal Aunt   . Cancer Brother        unaware  . Heart disease Brother     Social History Social History  Substance Use Topics  . Smoking status: Former Smoker    Quit date: 05/11/1970  . Smokeless tobacco: Never Used  . Alcohol use No     Allergies   Arimidex [anastrozole]; Morphine and related; and Vicodin [hydrocodone-acetaminophen]   Review of Systems Review of Systems  Per HPI  Physical Exam Updated Vital Signs BP 106/81   Pulse (!) 123   Temp 98.2 F (36.8 C) (Oral)   Resp (!) 24   Ht 5' 1.5" (1.562 m)   Wt 83.2 kg (183 lb 8 oz)   SpO2 96%   BMI 34.11 kg/m   Physical Exam  Constitutional: She is oriented to person, place, and time. She appears well-developed and well-nourished. No  distress.  Eyes: Conjunctivae and EOM are normal. Pupils are equal, round, and reactive to light.  Neck: Normal range of motion. Neck supple.  Cardiovascular: Intact distal pulses.  An irregularly irregular rhythm present. Tachycardia present.   No murmur heard. Pulmonary/Chest: Effort normal and breath sounds normal. No respiratory distress. She has no wheezes.  Musculoskeletal: Normal range of motion. She exhibits no edema.  Neurological: She is alert and oriented to person, place, and time. No cranial nerve deficit or sensory deficit.  Strength 5/5 in all extremities.   Skin: Skin is warm and dry.  Psychiatric: She has a normal mood and affect.     ED Treatments / Results  Labs (all labs ordered are listed, but only abnormal results are displayed) Labs Reviewed  BASIC METABOLIC PANEL - Abnormal; Notable for the following:       Result Value   Glucose, Bld 168 (*)    GFR calc non Af Amer 55 (*)    All other components within normal limits  CBC  I-STAT TROPOININ, ED    EKG  EKG Interpretation  Date/Time:  Saturday September 12 2016 15:18:03 EDT Ventricular Rate:  144 PR Interval:    QRS Duration: 86 QT Interval:  263 QTC Calculation: 406 R Axis:   -40 Text Interpretation:  Atrial fibrillation with rapid V-rate Probable LVH with secondary repol abnrm Inferior infarct, old Anteroseptal infarct, old Confirmed by Malvin Johns [phone]) on 09/12/2016 3:35:02 PM Also confirmed by Malvin Johns [phone]), editor Verna Czech [phone])  on 09/12/2016 4:31:25 PM       Radiology Dg Chest 1 View  Result Date: 09/12/2016 CLINICAL DATA:  Intermittent difficulty breathing today. EXAM: CHEST 1 VIEW COMPARISON:  PA and lateral chest 06/10/2011. FINDINGS: The lungs are clear. Heart size is normal. No pneumothorax or pleural effusion. Aortic atherosclerosis is noted. Hiatal hernia is seen. No acute bony abnormality. Surgical clips right axilla are identified. IMPRESSION: No acute disease.  Atherosclerosis. Hiatal hernia. Electronically Signed   By: Inge Rise M.D.   On: 09/12/2016 16:25    Procedures Procedures (including critical care time)  Medications Ordered in ED Medications  diltiazem (CARDIZEM) 1 mg/mL load via infusion 10 mg (10 mg Intravenous Bolus from Bag 09/12/16 1610)    And  diltiazem (CARDIZEM) 100 mg in dextrose 5 % 100 mL (1 mg/mL) infusion (5 mg/hr Intravenous New Bag/Given 09/12/16 1610)     Initial Impression / Assessment and Plan / ED Course  I have reviewed the triage vital signs and the nursing notes.  Pertinent labs & imaging results that were available during my care of the patient were reviewed by me and considered in my medical decision making (see chart for details).  Clinical Course as of Sep 13 1639  Sat Sep 12, 2016  1636 ED EKG within 10 minutes [CW]    Clinical Course User Index [CW] Nicolette Bang, DO   Patient presenting after episodes of epigastric pain today found to be in Afib with RVR. CBC and BMET unremarkable. Troponin negative. CXR negative and patient does not appear to be volume overloaded on exam. Cardizem bolus and drip started as patient not a candidate for cardioversion. HR improved with treatment.   Called and discussed patient with cardiology, Dr. Sallyanne Kuster who agreed to admit. After discussion, patient spontaneously converted to NSR. Croitoru evaluated the patient and elected to admit patient for further management and work up.   Final Clinical Impressions(s) / ED Diagnoses   Final diagnoses:  Atrial fibrillation with RVR [facility])    New Prescriptions New Prescriptions   No medications on file     Nicolette Bang, DO 09/12/16 [place], [place], DO 09/12/16 1719    Malvin Johns, MD 09/13/16 [phone]

## 2016-09-12 NOTE — H&P (Signed)
Cardiology history and physical:   Patient ID: [name]; [id number]; 07/03/1925   Admit date: 09/12/2016 Date of Consult: 09/12/2016  Primary Care Provider: Mayra Neer, MD Primary Cardiologist: New    Patient Profile:   [name] is a 83 y.o. female with a hx of Hypertension, hyperlipidemia, remote right breast cancer who is being seen today for the evaluation of paroxysmal atrial fibrillation with rapid ventricular response.  History of Present Illness:   [name] presents today with complaints of chest discomfort and dyspnea, occurring spontaneously and randomly at rest today. One occurred at 4 AM and resolved spontaneously. The second episode this around noon and brought her to the emergency room.  She describes himself as "huffing and puffing" and having pain from her nose to her navel. It sounds like dyspnea was the dominant complaint. She was not aware of palpitations.  Her electrocardiogram showed atrial fibrillation with rapid ventricular response. She was started on intravenous diltiazem. Just a few minutes before I came in to see her, her rhythm spontaneously converted to normal sinus rhythm, with a 3 second pause.  Her current medications include almost losartan-amlodipine-hydrochlorothiazide as well as simvastatin. She takes occasional furosemide. She has not currently or recently had problems with edema and denies orthopnea or PND. At site of the recent events she does not have exertional dyspnea. She has never had a stroke or TIA and denies any bleeding problems. She has had 2 falls in the last few weeks, both of them sound like "tripping". On one occasion she was stepping off the curb; on the second she was getting up from a breakfast table and her feet got tangled. She has never experienced syncope or near syncope.  Past Medical History:  Diagnosis Date  . Arthritis    low spine  . Breast cancer [facility]) 2013   right breast  . Breast cancer, right  breast, recurrent 06/03/2011  . Cancer [facility]) 1989   breast right  . Chronic back pain   . Hypertension     Past Surgical History:  Procedure Laterality Date  . ABDOMINAL HYSTERECTOMY  1974  . BREAST SURGERY  1989   rt lumpectomy-axillary dissection  . COLONOSCOPY    . EYE SURGERY     both cataracts  . JOINT REPLACEMENT  2004, 2005   both knees   . MASTECTOMY  06/11/11   right breast  . SHOULDER SURGERY     patient does not remember date of procedure     Inpatient Medications: Scheduled Meds: . [START ON 09/13/2016] aspirin EC  81 mg Oral Daily  . atorvastatin  20 mg Oral q1800  . calcium carbonate  600 mg Oral Daily  . celecoxib  200 mg Oral Daily  . heparin  5,000 Units Subcutaneous Q8H  . pantoprazole  40 mg Oral Daily  . sertraline  25 mg Oral Daily  . vitamin C  500 mg Oral Daily  . Vitamin D3  1,000 Units Oral Daily   Continuous Infusions: . sodium chloride    . diltiazem (CARDIZEM) infusion Stopped (09/12/16 1705)   PRN Meds: sodium chloride, gabapentin, nitroGLYCERIN, ondansetron (ZOFRAN) IV  Allergies:    Allergies  Allergen Reactions  . Arimidex [Anastrozole] Other (See Comments)    Neck cramping  . Morphine And Related Nausea And Vomiting  . Vicodin [Hydrocodone-Acetaminophen] Nausea Only    Social History:   Social History   Social History  . Marital status: Married    Spouse name: N/A  .  Number of children: N/A  . Years of education: N/A   Occupational History  . Not on file.   Social History Main Topics  . Smoking status: Former Smoker    Quit date: 05/11/1970  . Smokeless tobacco: Never Used  . Alcohol use No  . Drug use: No  . Sexual activity: Not on file   Other Topics Concern  . Not on file   Social History Narrative  . No narrative on file    Family History:   The patient's family history includes Breast cancer in her maternal aunt; Cancer in her brother; Heart disease in her brother and mother.  ROS:  Please see the  history of present illness.  ROS  All other ROS reviewed and negative.     Physical Exam/Data:   Vitals:   09/12/16 1630 09/12/16 1645 09/12/16 1700 09/12/16 1715  BP: 124/90 96/73 122/72   Pulse: 79 77 75 73  Resp: 18 17 17 18   Temp:      TempSrc:      SpO2: 93% 95% 97% 98%  Weight:      Height:       No intake or output data in the 24 hours ending 09/12/16 1733 Filed Weights   09/12/16 1513  Weight: 183 lb 8 oz (83.2 kg)   Body mass index is 34.11 kg/m.  General:  Well nourished, well developed, in no acute distress. HEENT: normal Lymph: no adenopathy Neck: no JVD Endocrine:  No thryomegaly Vascular: No carotid bruits; FA pulses 2+ bilaterally without bruits  Cardiac:  normal S1, S2; RRR; no murmur  Lungs:  clear to auscultation bilaterally, no wheezing, rhonchi or rales  Abd: soft, nontender, no hepatomegaly  Ext: no edema Musculoskeletal:  No deformities, BUE and BLE strength normal and equal Skin: warm and dry  Neuro:  CNs 2-12 intact, no focal abnormalities noted Psych:  Normal affect    EKG:  The EKG was personally reviewed and demonstrates Atrial fibrillation with rapid ventricular response, otherwise normal. Repeat ECG shows normal sinus rhythm with voltage criteria only for LVH.  Laboratory Data:  Chemistry Recent Labs Lab 09/12/16 1514  NA 139  K 3.7  CL 102  CO2 23  GLUCOSE 168*  BUN 11  CREATININE 0.90  CALCIUM 10.1  GFRNONAA 55*  GFRAA >60  ANIONGAP 14    No results for input(s): PROT, ALBUMIN, AST, ALT, ALKPHOS, BILITOT in the last 168 hours. Hematology Recent Labs Lab 09/12/16 1514  WBC 9.4  RBC 4.61  HGB 14.1  HCT 43.0  MCV 93.3  MCH 30.6  MCHC 32.8  RDW 12.7  PLT 285   Cardiac EnzymesNo results for input(s): TROPONINI in the last 168 hours.  Recent Labs Lab 09/12/16 1535  TROPIPOC 0.01    BNPNo results for input(s): BNP, PROBNP in the last 168 hours.  DDimer No results for input(s): DDIMER in the last 168  hours.  Radiology/Studies:  Dg Chest 1 View  Result Date: 09/12/2016 CLINICAL DATA:  Intermittent difficulty breathing today. EXAM: CHEST 1 VIEW COMPARISON:  PA and lateral chest 06/10/2011. FINDINGS: The lungs are clear. Heart size is normal. No pneumothorax or pleural effusion. Aortic atherosclerosis is noted. Hiatal hernia is seen. No acute bony abnormality. Surgical clips right axilla are identified. IMPRESSION: No acute disease. Atherosclerosis. Hiatal hernia. Electronically Signed   By: Inge Rise M.D.   On: 09/12/2016 16:25    Assessment and Plan:   1. AFib: She has symptomatic paroxysmal atrial fibrillation rapid  ventricular response. Will start rate control medications. Will replace her amlodipine with diltiazem. The big concern would be that this could worsen her postconversion pauses. If this occurs she will need a pacemaker. We discussed this today. At this point, there is no indication for true antiarrhythmics. CHADSVasc 4-5 (age 82, HTN, gender, +/- CHF). I'm a little concerned about her falls, but she has taken steps to avoid falling in the future. She is now using a walker. She resides at [place] [facility] and has lots of help. At this point the benefit from stroke prevention seems to still outweigh the risk of injury and bleeding from falls. Will start on Eliquis 5 mg BID (advanced age, but normal renal function and not low body weight). 2. Chest pain: In the absence of atrial fibrillation she has not described problems with angina pectoris. The chest discomfort may have been related solely to her the arrhythmia or might have unmasked underlying coronary problems. We'll recheck cardiac enzymes. For the time being I do not think we will pursue a diagnosis of coronary disease in this 83 year old woman, unless there is evidence of left ventricular dysfunction or unless she develops angina. 3. CHF: It appears to me that she had acute heart failure related to the arrhythmia. We'll check an  echocardiogram. She probably has diastolic dysfunction related to advanced age and long-standing hypertension. 4. HLP: Due to the interaction with diltiazem will stop simvastatin and replace it with an alternative agent   Signed, Sanda Klein, MD  09/12/2016 5:33 PM

## 2016-09-13 ENCOUNTER — Observation Stay (HOSPITAL_BASED_OUTPATIENT_CLINIC_OR_DEPARTMENT_OTHER): Payer: Medicare Other

## 2016-09-13 DIAGNOSIS — I10 Essential (primary) hypertension: Secondary | ICD-10-CM | POA: Diagnosis not present

## 2016-09-13 DIAGNOSIS — I4891 Unspecified atrial fibrillation: Secondary | ICD-10-CM | POA: Diagnosis present

## 2016-09-13 DIAGNOSIS — I480 Paroxysmal atrial fibrillation: Secondary | ICD-10-CM

## 2016-09-13 DIAGNOSIS — R079 Chest pain, unspecified: Secondary | ICD-10-CM | POA: Diagnosis not present

## 2016-09-13 DIAGNOSIS — Z7901 Long term (current) use of anticoagulants: Secondary | ICD-10-CM

## 2016-09-13 DIAGNOSIS — I495 Sick sinus syndrome: Secondary | ICD-10-CM | POA: Diagnosis not present

## 2016-09-13 DIAGNOSIS — Z79899 Other long term (current) drug therapy: Secondary | ICD-10-CM | POA: Diagnosis not present

## 2016-09-13 DIAGNOSIS — E7800 Pure hypercholesterolemia, unspecified: Secondary | ICD-10-CM | POA: Diagnosis not present

## 2016-09-13 LAB — ECHOCARDIOGRAM COMPLETE
E decel time: 278 msec
Height: 65 in
IVS/LV PW RATIO, ED: 1
LA vol A4C: 68.6 ml
LA vol index: 35.4 mL/m2
LA vol: 68.4 mL
LV PW d: 12 mm — AB (ref 0.6–1.1)
LV e' LATERAL: 7.4 cm/s
LVOT SV: 46 mL
LVOT VTI: 20.3 cm
LVOT area: 2.27 cm2
LVOT diameter: 17 mm
LVOT peak grad rest: 3 mmHg
LVOT peak vel: 88.7 cm/s
Lateral S' vel: 11.7 cm/s
MV Dec: 278
MV pk E vel: 0.8 m/s
RV sys press: 26 mmHg
Reg peak vel: 242 cm/s
TAPSE: 16.7 mm
TDI e' lateral: 7.4
TDI e' medial: 4.68
TR max vel: 242 cm/s
Weight: 2867.2 oz

## 2016-09-13 MED ORDER — ACETAMINOPHEN 325 MG PO TABS
650.0000 mg | ORAL_TABLET | Freq: Once | ORAL | Status: AC
  Administered 2016-09-13: 650 mg via ORAL
  Filled 2016-09-13: qty 2

## 2016-09-13 MED ORDER — APIXABAN 5 MG PO TABS: 5.0000 mg | ORAL_TABLET | Freq: Two times a day (BID) | ORAL | 5 refills | Status: DC

## 2016-09-13 MED ORDER — IRBESARTAN 150 MG PO TABS
300.0000 mg | ORAL_TABLET | Freq: Every day | ORAL | Status: DC
  Administered 2016-09-13: 300 mg via ORAL
  Filled 2016-09-13: qty 2

## 2016-09-13 MED ORDER — DILTIAZEM HCL ER COATED BEADS 120 MG PO CP24: 120.0000 mg | ORAL_CAPSULE | Freq: Every day | ORAL | 5 refills | Status: DC

## 2016-09-13 MED ORDER — LOSARTAN POTASSIUM 50 MG PO TABS: 50.0000 mg | ORAL_TABLET | Freq: Every day | ORAL | 11 refills | Status: DC

## 2016-09-13 MED ORDER — NITROGLYCERIN 0.4 MG SL SUBL: 0.4000 mg | SUBLINGUAL_TABLET | SUBLINGUAL | 2 refills | Status: DC | PRN

## 2016-09-13 MED ORDER — APIXABAN 5 MG PO TABS
5.0000 mg | ORAL_TABLET | Freq: Two times a day (BID) | ORAL | Status: DC
  Administered 2016-09-13: 5 mg via ORAL
  Filled 2016-09-13: qty 1

## 2016-09-13 NOTE — Discharge Summary (Signed)
Patient ID: [name],  MRN: [id number], DOB/AGE: April 20, 1925 83 y.o.  Admit date: 09/12/2016 Discharge date: 09/13/2016  Primary Care Provider: Mayra Neer, MD Primary Cardiologist: Dr Sallyanne Kuster  Discharge Diagnoses Principal Problem:   Atrial fibrillation, new onset [facility]) Active Problems:   Personal history of malignant neoplasm of breast   Chest pain with moderate risk of acute coronary syndrome   Chronic anticoagulation   Essential hypertension    Procedures: Echo 09/13/16   Hospital Course: 83 y/o female presented with chest pain and was found to be in AF with RVR.  Reports that around 4 am she woke up with pain in the epigastric region that radiated up her neck to her nose/mouth and down into her abdomen. Patient did not have sensation of heart racing or beating irregularly. Denies history of Afib and has never been on anticoagulant. She converted in the ED with a 3 second pause. She was admitted overnight and ruled out for an MI. She maintained NSR overnight. Her echo was normal 09/13/16 and Dr Sallyanne Kuster feels she can be discharged. He did want her on a NOAC, CHA2DS2VASc=4-5. She has had some falls at home but these sounded mechanical and the pt has taken steps to correct this (obtained a walker). She was hypertensive. Previous records indicate she was on Tribenzor in the past but this she hasn't taken this for some time. An ARB was added at discharge. She'll need a follow up BMP and B/P in a couple of weeks.    Discharge Vitals:  Blood pressure (!) 155/64, pulse 73, temperature 98.2 F (36.8 C), temperature source Oral, resp. rate 14, height 5\' 5"  (1.651 m), weight 179 lb 3.2 oz (81.3 kg), SpO2 97 %.    Labs: Results for orders placed or performed during the hospital encounter of 09/12/16 (from the past 24 hour(s))  Basic metabolic panel     Status: Abnormal   Collection Time: 09/12/16  3:14 PM  Result Value Ref Range   Sodium 139 135 - 145 mmol/L   Potassium 3.7 3.5  - 5.1 mmol/L   Chloride 102 101 - 111 mmol/L   CO2 23 22 - 32 mmol/L   Glucose, Bld 168 (H) 65 - 99 mg/dL   BUN 11 6 - 20 mg/dL   Creatinine, Ser 0.90 0.44 - 1.00 mg/dL   Calcium 10.1 8.9 - 10.3 mg/dL   GFR calc non Af Amer 55 (L) >60 mL/min   GFR calc Af Amer >60 >60 mL/min   Anion gap 14 5 - 15  CBC     Status: None   Collection Time: 09/12/16  3:14 PM  Result Value Ref Range   WBC 9.4 4.0 - 10.5 K/uL   RBC 4.61 3.87 - 5.11 MIL/uL   Hemoglobin 14.1 12.0 - 15.0 g/dL   HCT 43.0 36.0 - 46.0 %   MCV 93.3 78.0 - 100.0 fL   MCH 30.6 26.0 - 34.0 pg   MCHC 32.8 30.0 - 36.0 g/dL   RDW 12.7 11.5 - 15.5 %   Platelets 285 150 - 400 K/uL  I-Stat Troponin, ED (not at [facility])     Status: None   Collection Time: 09/12/16  3:35 PM  Result Value Ref Range   Troponin i, poc 0.01 0.00 - 0.08 ng/mL   Comment 3          CBC     Status: None   Collection Time: 09/12/16  5:44 PM  Result Value Ref Range  WBC 8.6 4.0 - 10.5 K/uL   RBC 4.38 3.87 - 5.11 MIL/uL   Hemoglobin 13.4 12.0 - 15.0 g/dL   HCT 40.7 36.0 - 46.0 %   MCV 92.9 78.0 - 100.0 fL   MCH 30.6 26.0 - 34.0 pg   MCHC 32.9 30.0 - 36.0 g/dL   RDW 12.7 11.5 - 15.5 %   Platelets 276 150 - 400 K/uL  Creatinine, serum     Status: Abnormal   Collection Time: 09/12/16  5:44 PM  Result Value Ref Range   Creatinine, Ser 0.87 0.44 - 1.00 mg/dL   GFR calc non Af Amer 57 (L) >60 mL/min   GFR calc Af Amer >60 >60 mL/min  Troponin I     Status: None   Collection Time: 09/12/16  5:44 PM  Result Value Ref Range   Troponin I <0.03 <0.03 ng/mL  TSH     Status: None   Collection Time: 09/12/16  5:44 PM  Result Value Ref Range   TSH 2.844 0.350 - 4.500 uIU/mL  Magnesium     Status: None   Collection Time: 09/12/16  5:44 PM  Result Value Ref Range   Magnesium 1.9 1.7 - 2.4 mg/dL  Troponin I     Status: Abnormal   Collection Time: 09/12/16 10:33 PM  Result Value Ref Range   Troponin I 0.04 (HH) <0.03 ng/mL    Disposition:  Follow-up  Information    Croitoru, Mihai, MD Follow up.   Specialty:  Cardiology Why:  office will call you Contact information: [address] [place] [address] [phone]           Discharge Medications:  Allergies as of 09/13/2016      Reactions   Arimidex [anastrozole] Other (See Comments)   Neck cramping   Morphine And Related Nausea And Vomiting   Vicodin [hydrocodone-acetaminophen] Nausea Only      Medication List    STOP taking these medications   CELEBREX 200 MG capsule Generic drug:  celecoxib   nystatin powder Commonly known as:  MYCOSTATIN/NYSTOP   tamoxifen 20 MG tablet Commonly known as:  NOLVADEX     TAKE these medications   ACIPHEX 20 MG tablet Generic drug:  RABEprazole Take 20 mg by mouth Daily.   apixaban 5 MG Tabs tablet Commonly known as:  ELIQUIS Take 1 tablet (5 mg total) by mouth 2 (two) times daily.   atorvastatin 20 MG tablet Commonly known as:  LIPITOR Take 20 mg by mouth daily.   calcium carbonate 600 MG Tabs tablet Commonly known as:  OS-CAL Take 600 mg by mouth daily.   diltiazem 120 MG 24 hr capsule Commonly known as:  CARDIZEM CD Take 1 capsule (120 mg total) by mouth daily. Start taking on:  09/14/2016   fexofenadine 60 MG tablet Commonly known as:  ALLEGRA Take 60 mg by mouth daily as needed for allergies.   gabapentin 100 MG capsule Commonly known as:  NEURONTIN Take 100-300 mg by mouth at bedtime as needed (for neuropathy).   Ginkgo Biloba 40 MG Tabs Take 60 mg by mouth daily.   losartan 50 MG tablet Commonly known as:  COZAAR Take 1 tablet (50 mg total) by mouth daily.   nitroGLYCERIN 0.4 MG SL tablet Commonly known as:  NITROSTAT Place 1 tablet (0.4 mg total) under the tongue every 5 (five) minutes x 3 doses as needed for chest pain.   sertraline 25 MG tablet Commonly known as:  ZOLOFT Take 25  mg by mouth daily.   TART CHERRY ADVANCED Caps Take 1 capsule by mouth daily after breakfast.     vitamin C 500 MG tablet Commonly known as:  ASCORBIC ACID Take 500 mg by mouth daily.   Vitamin D3 1000 units Caps Take 1,000 Units by mouth daily.   VITAMIN E PO Take 1,000 Units by mouth daily.        Duration of Discharge Encounter: Greater than 30 minutes including physician time.  Angelena Form PA-C 09/13/2016 2:00 PM

## 2016-09-13 NOTE — Progress Notes (Signed)
CRITICAL VALUE ALERT  Critical Value:  Troponin = 0.04  Date & Time Notied:  09/12/16 at 2325  Provider Notified: Cardiology Fellow  Orders Received/Actions taken: Patient receiving Heparin--no new orders at this time

## 2016-09-13 NOTE — Progress Notes (Signed)
  Echocardiogram 2D Echocardiogram has been performed.  [name] 09/13/2016, 11:53 AM

## 2016-09-13 NOTE — Progress Notes (Signed)
ANTICOAGULATION CONSULT NOTE - Initial Consult  Pharmacy Consult for Eliquis Indication: atrial fibrillation  Allergies  Allergen Reactions  . Arimidex [Anastrozole] Other (See Comments)    Neck cramping  . Morphine And Related Nausea And Vomiting  . Vicodin [Hydrocodone-Acetaminophen] Nausea Only    Patient Measurements: Height: 5\' 5"  (165.1 cm) Weight: 179 lb 3.2 oz (81.3 kg) IBW/kg (Calculated) : 57  Vital Signs: Temp: 98.2 F (36.8 C) (06/03 0440) Temp Source: Oral (06/03 0440) BP: 155/64 (06/03 0440) Pulse Rate: 73 (06/03 0440)  Labs:  Recent Labs  09/12/16 1514 09/12/16 1744 09/12/16 2233  HGB 14.1 13.4  --   HCT 43.0 40.7  --   PLT 285 276  --   CREATININE 0.90 0.87  --   TROPONINI  --  <0.03 0.04*    Estimated Creatinine Clearance: 46.2 mL/min (by C-G formula based on SCr of 0.87 mg/dL).   Medical History: Past Medical History:  Diagnosis Date  . Arthritis    low spine  . Breast cancer [facility]) 2013   right breast  . Breast cancer, right breast, recurrent 06/03/2011  . Cancer [facility]) 1989   breast right  . Chronic back pain   . Hypertension     Assessment: CH is an 83 yo woman presenting in afib with RVR. Pharmacy consulted to start eliquis. Her CHADSVASC is ~4 (HTN, female, age >78). CBC is wnl and no bleeding noted. She meets criteria for full dose Eliquis.    Plan:  Eliquis 5mg  BID Monitor CBC and renal function Monitor s/sx of bleeding  Dierdre Harness, BS, PharmD Clinical Pharmacy Resident [phone] (Pager) 09/13/2016 11:06 AM

## 2016-09-13 NOTE — Care Management Note (Addendum)
Case Management Note  Patient Details  Name: [name] MRN: [id number] Date of Birth: May 29, 1925  Subjective/Objective:  83 y.o. F admitted with Atrial Fibrillation started on Eliquis. CM issued Free 30 day Eliquis card to be used until she can be seen by Cardiology and prior auth, if req can be completed. Pt and daughter agree hey have no DME or HH needs.  Current resident of [place]                  Action/Plan:CM will sign off for now but will be available should additional discharge needs arise or disposition change.    Expected Discharge Date:  09/13/16               Expected Discharge Plan:  Home/Self Care  In-House Referral:  NA  Discharge planning Services  CM Consult, Medication Assistance  Post Acute Care Choice:  NA Choice offered to:  Patient, Adult Children  DME Arranged:  N/A DME Agency:  NA  HH Arranged:  NA HH Agency:  NA  Status of Service:  Completed, signed off  If discussed at [place] of Stay Meetings, dates discussed:    Additional Comments:  Delrae Sawyers, RN 09/13/2016, 2:33 PM

## 2016-09-13 NOTE — Discharge Instructions (Signed)
Atrial Fibrillation Atrial fibrillation is a type of heartbeat that is irregular or fast (rapid). If you have this condition, your heart keeps quivering in a weird (chaotic) way. This condition can make it so your heart cannot pump blood normally. Having this condition gives a person more risk for stroke, heart failure, and other heart problems. There are different types of atrial fibrillation. Talk with your doctor to learn about the type that you have. Follow these instructions at home:  Take over-the-counter and prescription medicines only as told by your doctor.  If your doctor prescribed a blood-thinning medicine, take it exactly as told. Taking too much of it can cause bleeding. If you do not take enough of it, you will not have the protection that you need against stroke and other problems.  Do not use any tobacco products. These include cigarettes, chewing tobacco, and e-cigarettes. If you need help quitting, ask your doctor.  If you have apnea (obstructive sleep apnea), manage it as told by your doctor.  Do not drink alcohol.  Do not drink beverages that have caffeine. These include coffee, soda, and tea.  Maintain a healthy weight. Do not use diet pills unless your doctor says they are safe for you. Diet pills may make heart problems worse.  Follow diet instructions as told by your doctor.  Exercise regularly as told by your doctor.  Keep all follow-up visits as told by your doctor. This is important. Contact a doctor if:  You notice a change in the speed, rhythm, or strength of your heartbeat.  You are taking a blood-thinning medicine and you notice more bruising.  You get tired more easily when you move or exercise. Get help right away if:  You have pain in your chest or your belly (abdomen).  You have sweating or weakness.  You feel sick to your stomach (nauseous).  You notice blood in your throw up (vomit), poop (stool), or pee (urine).  You are short of  breath.  You suddenly have swollen feet and ankles.  You feel dizzy.  Your suddenly get weak or numb in your face, arms, or legs, especially if it happens on one side of your body.  You have trouble talking, trouble understanding, or both.  Your face or your eyelid droops on one side. These symptoms may be an emergency. Do not wait to see if the symptoms will go away. Get medical help right away. Call your local emergency services (911 in the [place] Do not drive yourself to the hospital. This information is not intended to replace advice given to you by your health care provider. Make sure you discuss any questions you have with your health care provider. Document Released: 01/07/2008 Document Revised: 09/05/2015 Document Reviewed: 07/25/2014 Elsevier Interactive Patient Education  2018 [employer]. Apixaban oral tablets What is this medicine? APIXABAN (a PIX a ban) is an anticoagulant (blood thinner). It is used to lower the chance of stroke in people with a medical condition called atrial fibrillation. It is also used to treat or prevent blood clots in the lungs or in the veins. This medicine may be used for other purposes; ask your health care provider or pharmacist if you have questions. COMMON BRAND NAME(S): Eliquis What should I tell my health care provider before I take this medicine? They need to know if you have any of these conditions: -bleeding disorders -bleeding in the brain -blood in your stools (black or tarry stools) or if you have blood in your vomit -history  of stomach bleeding -kidney disease -liver disease -mechanical heart valve -an unusual or allergic reaction to apixaban, other medicines, foods, dyes, or preservatives -pregnant or trying to get pregnant -breast-feeding How should I use this medicine? Take this medicine by mouth with a glass of water. Follow the directions on the prescription label. You can take it with or without food. If it upsets your  stomach, take it with food. Take your medicine at regular intervals. Do not take it more often than directed. Do not stop taking except on your doctor's advice. Stopping this medicine may increase your risk of a blot clot. Be sure to refill your prescription before you run out of medicine. Talk to your pediatrician regarding the use of this medicine in children. Special care may be needed. Overdosage: If you think you have taken too much of this medicine contact a poison control center or emergency room at once. NOTE: This medicine is only for you. Do not share this medicine with others. What if I miss a dose? If you miss a dose, take it as soon as you can. If it is almost time for your next dose, take only that dose. Do not take double or extra doses. What may interact with this medicine? This medicine may interact with the following: -aspirin and aspirin-like medicines -certain medicines for fungal infections like ketoconazole and itraconazole -certain medicines for seizures like carbamazepine and phenytoin -certain medicines that treat or prevent blood clots like warfarin, enoxaparin, and dalteparin -clarithromycin -NSAIDs, medicines for pain and inflammation, like ibuprofen or naproxen -rifampin -ritonavir -St. John's wort This list may not describe all possible interactions. Give your health care provider a list of all the medicines, herbs, non-prescription drugs, or dietary supplements you use. Also tell them if you smoke, drink alcohol, or use illegal drugs. Some items may interact with your medicine. What should I watch for while using this medicine? Visit your doctor or health care professional for regular checks on your progress. Notify your doctor or health care professional and seek emergency treatment if you develop breathing problems; changes in vision; chest pain; severe, sudden headache; pain, swelling, warmth in the leg; trouble speaking; sudden numbness or weakness of the face,  arm or leg. These can be signs that your condition has gotten worse. If you are going to have surgery or other procedure, tell your doctor that you are taking this medicine. What side effects may I notice from receiving this medicine? Side effects that you should report to your doctor or health care professional as soon as possible: -allergic reactions like skin rash, itching or hives, swelling of the face, lips, or tongue -signs and symptoms of bleeding such as bloody or black, tarry stools; red or dark-brown urine; spitting up blood or brown material that looks like coffee grounds; red spots on the skin; unusual bruising or bleeding from the eye, gums, or nose This list may not describe all possible side effects. Call your doctor for medical advice about side effects. You may report side effects to FDA at 1-800-FDA-1088. Where should I keep my medicine? Keep out of the reach of children. Store at room temperature between 20 and 25 degrees C (68 and 77 degrees F). Throw away any unused medicine after the expiration date. NOTE: This sheet is a summary. It may not cover all possible information. If you have questions about this medicine, talk to your doctor, pharmacist, or health care provider.  2018 Elsevier/Gold Standard (2015-10-21 11:54:23)

## 2016-09-13 NOTE — Progress Notes (Signed)
Progress Note  Patient Name: [name] Date of Encounter: 09/13/2016  Primary Cardiologist: New (Cabela Pacifico)  Subjective   No angina or dyspnea overnight. No further AFib. Mild sinus bradycardia (min 49 bpm, no pauses). Currently HR 70s. Minimal elevation in troponin.  Inpatient Medications    Scheduled Meds: . atorvastatin  20 mg Oral q1800  . calcium carbonate  1,500 mg Oral Q breakfast  . cholecalciferol  1,000 Units Oral Daily  . diltiazem  120 mg Oral Daily  . pantoprazole  40 mg Oral Daily  . sertraline  25 mg Oral Daily  . vitamin C  500 mg Oral Daily   Continuous Infusions: . sodium chloride     PRN Meds: sodium chloride, gabapentin, nitroGLYCERIN, ondansetron (ZOFRAN) IV   Vital Signs    Vitals:   09/12/16 1730 09/12/16 1800 09/12/16 1830 09/13/16 0440  BP: (!) 147/76 (!) 146/65 131/80 (!) 155/64  Pulse: 74 70  73  Resp: 16 14  14   Temp:   98.1 F (36.7 C) 98.2 F (36.8 C)  TempSrc:   Oral Oral  SpO2: 98% 96%  97%  Weight:   179 lb 3.2 oz (81.3 kg)   Height:   5\' 5"  (1.651 m)     Intake/Output Summary (Last 24 hours) at 09/13/16 1059 Last data filed at 09/12/16 2200  Gross per 24 hour  Intake              240 ml  Output                0 ml  Net              240 ml   Filed Weights   09/12/16 1513 09/12/16 1830  Weight: 183 lb 8 oz (83.2 kg) 179 lb 3.2 oz (81.3 kg)    Telemetry    NSR/sinus brady - Personally Reviewed  ECG    NSR, normal ECG - Personally Reviewed  Physical Exam  Calm and smiling GEN: No acute distress.   Neck: No JVD Cardiac: RRR, no murmurs, rubs, or gallops.  Respiratory: Clear to auscultation bilaterally. GI: Soft, nontender, non-distended  MS: No edema; No deformity. Neuro:  Nonfocal  Psych: Normal affect   Labs    Chemistry Recent Labs Lab 09/12/16 1514 09/12/16 1744  NA 139  --   K 3.7  --   CL 102  --   CO2 23  --   GLUCOSE 168*  --   BUN 11  --   CREATININE 0.90 0.87  CALCIUM 10.1  --     GFRNONAA 55* 57*  GFRAA >60 >60  ANIONGAP 14  --      Hematology Recent Labs Lab 09/12/16 1514 09/12/16 1744  WBC 9.4 8.6  RBC 4.61 4.38  HGB 14.1 13.4  HCT 43.0 40.7  MCV 93.3 92.9  MCH 30.6 30.6  MCHC 32.8 32.9  RDW 12.7 12.7  PLT 285 276    Cardiac Enzymes Recent Labs Lab 09/12/16 1744 09/12/16 2233  TROPONINI <0.03 0.04*    Recent Labs Lab 09/12/16 1535  TROPIPOC 0.01     BNPNo results for input(s): BNP, PROBNP in the last 168 hours.   DDimer No results for input(s): DDIMER in the last 168 hours.   Radiology    Dg Chest 1 View  Result Date: 09/12/2016 CLINICAL DATA:  Intermittent difficulty breathing today. EXAM: CHEST 1 VIEW COMPARISON:  PA and lateral chest 06/10/2011. FINDINGS: The lungs are clear. Heart size  is normal. No pneumothorax or pleural effusion. Aortic atherosclerosis is noted. Hiatal hernia is seen. No acute bony abnormality. Surgical clips right axilla are identified. IMPRESSION: No acute disease. Atherosclerosis. Hiatal hernia. Electronically Signed   By: Inge Rise M.D.   On: 09/12/2016 16:25    Cardiac Studies   ECHO pending  Patient Profile     83 y.o. female with newly recognized paroxysmal atrial fibrillation, presenting as angina and dyspnea at rest, spontaneous arrhythmia termination followed by a 3" pause.  Assessment & Plan    1. AFib: no recurrence overnight. TSH normal. Start DOAC, I do not think we will plan cardiac catheterization with normal ecg and barely abnormal enzymes. Suspect rate related symptoms. CHADSVasc 4-5. Start oral diltiazem and Eliquis. Avoid NSAIDs, prefer acetaminophen. Use walker all the time to avoid falls. 2. Tachy-brady sd: hopefully will be able to achieve rate control without need for pacemaker therapy. 3. HTN:  Will replace the amlodipine in her Tribenzor with diltiazem. Plan to DC on equivalent doses of Benicar and HCTZ. 4. CHF: Waiting for Echo. If there will be a long delay in echo, plan to  DC today and perform as an outpatient. 5. HLP: replace simvastatin due to interaction with Ca channel blocker.  Signed, Sanda Klein, MD  09/13/2016, 10:59 AM

## 2016-10-01 ENCOUNTER — Encounter: Payer: Self-pay | Admitting: Cardiology

## 2016-10-01 ENCOUNTER — Ambulatory Visit (INDEPENDENT_AMBULATORY_CARE_PROVIDER_SITE_OTHER): Payer: Medicare Other | Admitting: Cardiology

## 2016-10-01 VITALS — BP 140/66 | HR 76 | Ht 65.0 in | Wt 184.4 lb

## 2016-10-01 DIAGNOSIS — I10 Essential (primary) hypertension: Secondary | ICD-10-CM | POA: Diagnosis not present

## 2016-10-01 DIAGNOSIS — I4891 Unspecified atrial fibrillation: Secondary | ICD-10-CM | POA: Diagnosis not present

## 2016-10-01 DIAGNOSIS — Z853 Personal history of malignant neoplasm of breast: Secondary | ICD-10-CM

## 2016-10-01 DIAGNOSIS — Z7901 Long term (current) use of anticoagulants: Secondary | ICD-10-CM

## 2016-10-01 MED ORDER — DILTIAZEM HCL ER COATED BEADS 120 MG PO CP24: 120.0000 mg | ORAL_CAPSULE | Freq: Every day | ORAL | 5 refills | Status: DC

## 2016-10-01 MED ORDER — APIXABAN 5 MG PO TABS: 5.0000 mg | ORAL_TABLET | Freq: Two times a day (BID) | ORAL | 5 refills | Status: DC

## 2016-10-01 NOTE — Assessment & Plan Note (Signed)
New- Eliquis added 09/12/16- CHADs2VASc=4-5

## 2016-10-01 NOTE — Assessment & Plan Note (Addendum)
Patient diagnosed with right breast, UIQ, IDC, path Stage I on 01/20/1988. Recurrent 2013

## 2016-10-01 NOTE — Assessment & Plan Note (Signed)
Controlled.  

## 2016-10-01 NOTE — Patient Instructions (Addendum)
Medication Instructions:  Your physician recommends that you continue on your current medications as directed. Please refer to the Current Medication list given to you today.  Labwork: Your physician recommends that you return for lab work in: TODAY-BMP  Testing/Procedures: NONE   Follow-Up: Your physician wants you to follow-up in: [place]. You will receive a reminder letter in the mail two months in advance. If you don't receive a letter, please call our office to schedule the follow-up appointment.  Any Other Special Instructions Will Be Listed Below (If Applicable).     If you need a refill on your cardiac medications before your next appointment, please call your pharmacy.

## 2016-10-01 NOTE — Assessment & Plan Note (Signed)
Pt admitted with PAF-new onset 09/12/16. Converted after Diltiazem started

## 2016-10-01 NOTE — Progress Notes (Signed)
10/01/2016 [name]   01/17/1926  [id number]  Primary Physician Mayra Neer, MD Primary Cardiologist: Dr Sallyanne Kuster  HPI:  Pleasant 83 y/o female, lives at [facility], presented 09/12/16 with chest pain and was found to be in AF with RVR.  The pt reports that around 4 am she woke up with pain in the epigastric region that radiated up her neck to her nose/mouth and down into her abdomen.Patient did not have sensation of heart racing or beating irregularly. She did feel like she was choking. Denies history of Afib and has never been on anticoagulant. She converted in the ED with a 3 second pause after Diltiazem. She was admitted overnight and ruled out for an MI. She maintained NSR overnight. Her echo was normal 09/13/16 and Dr Sallyanne Kuster felt she can be discharged. He did want her on a NOAC, CHA2DS2VASc=4-5. She has had some falls at home but these sounded mechanical and the pt has taken steps to correct this (obtained a walker). She is seen in the office today for follow up. She has maintained NSR and had no further chest discomfort.    Current Outpatient Prescriptions  Medication Sig Dispense Refill  . ACIPHEX 20 MG tablet Take 20 mg by mouth Daily.     Marland Kitchen apixaban (ELIQUIS) 5 MG TABS tablet Take 1 tablet (5 mg total) by mouth 2 (two) times daily. 60 tablet 5  . atorvastatin (LIPITOR) 20 MG tablet Take 20 mg by mouth daily.     . calcium carbonate (OS-CAL) 600 MG TABS Take 600 mg by mouth daily.    . Cholecalciferol (VITAMIN D3) 1000 UNITS CAPS Take 1,000 Units by mouth daily.     Marland Kitchen diltiazem (CARDIZEM CD) 120 MG 24 hr capsule Take 1 capsule (120 mg total) by mouth daily. 30 capsule 5  . fexofenadine (ALLEGRA) 60 MG tablet Take 60 mg by mouth daily as needed for allergies.     Marland Kitchen gabapentin (NEURONTIN) 100 MG capsule Take 100-300 mg by mouth at bedtime as needed (for neuropathy).     . Ginkgo Biloba 40 MG TABS Take 60 mg by mouth daily.    Marland Kitchen losartan (COZAAR) 50 MG  tablet Take 1 tablet (50 mg total) by mouth daily. 30 tablet 11  . Misc Natural Products (TART CHERRY ADVANCED) CAPS Take 1 capsule by mouth 2 (two) times daily.     . nitroGLYCERIN (NITROSTAT) 0.4 MG SL tablet Place 1 tablet (0.4 mg total) under the tongue every 5 (five) minutes x 3 doses as needed for chest pain. 25 tablet 2  . sertraline (ZOLOFT) 25 MG tablet Take 25 mg by mouth daily.     . vitamin C (ASCORBIC ACID) 500 MG tablet Take 500 mg by mouth daily.    Marland Kitchen VITAMIN E PO Take 1,000 Units by mouth daily.      No current facility-administered medications for this visit.     Allergies  Allergen Reactions  . Arimidex [Anastrozole] Other (See Comments)    Neck cramping  . Morphine And Related Nausea And Vomiting  . Vicodin [Hydrocodone-Acetaminophen] Nausea Only    Past Medical History:  Diagnosis Date  . Arthritis    low spine  . Breast cancer [facility]) 2013   right breast  . Breast cancer, right breast, recurrent 06/03/2011  . Cancer [facility]) 1989   breast right  . Chronic back pain   . Hypertension     Social History   Social History  . Marital  status: Married    Spouse name: N/A  . Number of children: N/A  . Years of education: N/A   Occupational History  . Not on file.   Social History Main Topics  . Smoking status: Former Smoker    Quit date: 05/11/1970  . Smokeless tobacco: Never Used  . Alcohol use No  . Drug use: No  . Sexual activity: Not on file   Other Topics Concern  . Not on file   Social History Narrative  . No narrative on file     Family History  Problem Relation Age of Onset  . Heart disease Mother   . Breast cancer Maternal Aunt   . Cancer Brother        unaware  . Heart disease Brother      Review of Systems: General: negative for chills, fever, night sweats or weight changes.  Cardiovascular: negative for chest pain, dyspnea on exertion, edema, orthopnea, palpitations, paroxysmal nocturnal dyspnea or shortness of  breath Dermatological: negative for rash Respiratory: negative for cough or wheezing Urologic: negative for hematuria Abdominal: negative for nausea, vomiting, diarrhea, bright red blood per rectum, melena, or hematemesis Neurologic: negative for visual changes, syncope, or dizziness "Gout"- Rt wrist All other systems reviewed and are otherwise negative except as noted above.    Blood pressure 140/66, pulse 76, height 5\' 5"  (1.651 m), weight 184 lb 6.4 oz (83.6 kg), SpO2 94 %.  General appearance: alert, cooperative, no distress and mildly obese Neck: no carotid bruit and no JVD Lungs: clear to auscultation bilaterally Heart: regular rate and rhythm Extremities: no edema, Rt wrist is not swollen, red, or hot Skin: Skin color, texture, turgor normal. No rashes or lesions Neurologic: Grossly normal  EKG NSR, poor anterior RW, LAD  ASSESSMENT AND PLAN:   Atrial fibrillation, new onset ([place]) Pt admitted with PAF-new onset 09/12/16. Converted after Diltiazem started  Chronic anticoagulation New- Eliquis added 09/12/16- CHADs2VASc=4-5  Essential hypertension Controlled  Personal history of malignant neoplasm of breast Patient diagnosed with right breast, UIQ, IDC, path Stage I on 01/20/1988. Recurrent 2013   PLAN  Check BMP as Cozaar is new. Otherwise same Rx. I spoke with the pt's daughter's who accompanied her.   Kerin Ransom PA-C 10/01/2016 2:41 PM

## 2016-10-02 LAB — BASIC METABOLIC PANEL
BUN/Creatinine Ratio: 13 (ref 12–28)
BUN: 11 mg/dL (ref 8–27)
CO2: 22 mmol/L (ref 20–29)
Calcium: 9.5 mg/dL (ref 8.7–10.3)
Chloride: 96 mmol/L (ref 96–106)
Creatinine, Ser: 0.86 mg/dL (ref 0.57–1.00)
GFR calc Af Amer: 69 mL/min/{1.73_m2} (ref >59)
GFR calc non Af Amer: 60 mL/min/{1.73_m2} (ref >59)
Glucose: 175 mg/dL — ABNORMAL HIGH (ref 65–99)
Potassium: 4.3 mmol/L (ref 3.5–5.2)
Sodium: 137 mmol/L (ref 134–144)

## 2016-11-09 ENCOUNTER — Other Ambulatory Visit: Payer: Self-pay | Admitting: *Deleted

## 2016-11-09 DIAGNOSIS — Z853 Personal history of malignant neoplasm of breast: Secondary | ICD-10-CM

## 2016-11-09 DIAGNOSIS — I10 Essential (primary) hypertension: Secondary | ICD-10-CM

## 2016-11-09 DIAGNOSIS — Z7901 Long term (current) use of anticoagulants: Secondary | ICD-10-CM

## 2016-11-09 DIAGNOSIS — I4891 Unspecified atrial fibrillation: Secondary | ICD-10-CM

## 2016-11-09 MED ORDER — APIXABAN 5 MG PO TABS: 5.0000 mg | ORAL_TABLET | Freq: Two times a day (BID) | ORAL | 5 refills | Status: DC

## 2016-11-09 NOTE — Telephone Encounter (Signed)
Eliquis Refill was sent to [facility] on 10/01/16 then we received a request from [facility].  Called the pt to clarify & she stated she does not get any meds from either of those & [facility] [address] across from the [place]. Med will be sent as pt requested to requested Pharmacy.   Pt is 83 years old, Wt-83.6kg, Crea-0.86 on 10/01/16 & last seen by [facility] PA on 10/01/16.

## 2016-11-17 ENCOUNTER — Other Ambulatory Visit: Payer: Self-pay | Admitting: Family Medicine

## 2016-11-25 ENCOUNTER — Telehealth: Payer: Self-pay | Admitting: Cardiology

## 2016-11-25 ENCOUNTER — Other Ambulatory Visit: Payer: Self-pay

## 2016-11-25 MED ORDER — ATORVASTATIN CALCIUM 20 MG PO TABS: 20.0000 mg | ORAL_TABLET | Freq: Every day | ORAL | 3 refills | Status: DC

## 2016-11-25 MED ORDER — RABEPRAZOLE SODIUM 20 MG PO TBEC: 20.0000 mg | DELAYED_RELEASE_TABLET | Freq: Every day | ORAL | 3 refills | Status: DC

## 2016-11-25 NOTE — Telephone Encounter (Signed)
rx sent to pharmacy

## 2016-11-25 NOTE — Telephone Encounter (Signed)
°*  STAT* If patient is at the pharmacy, call can be transferred to refill team.   1. Which medications need to be refilled? (please list name of each medication and dose if known) Atorvastatin and Aciphex-new prescription  2. Which pharmacy/location (including street and city if local pharmacy) is [phone]  3. Do they need a 30 day or 90 day supply?90 days and refills

## 2017-01-25 ENCOUNTER — Other Ambulatory Visit: Payer: Self-pay | Admitting: Family Medicine

## 2017-01-25 DIAGNOSIS — Z1231 Encounter for screening mammogram for malignant neoplasm of breast: Secondary | ICD-10-CM

## 2017-02-11 ENCOUNTER — Ambulatory Visit
Admission: RE | Admit: 2017-02-11 | Discharge: 2017-02-11 | Disposition: A | Discharge status: Home / Self Care | Payer: Medicare Other | Source: Ambulatory Visit | Attending: Family Medicine | Admitting: Family Medicine

## 2017-02-11 DIAGNOSIS — Z1231 Encounter for screening mammogram for malignant neoplasm of breast: Secondary | ICD-10-CM

## 2017-03-10 ENCOUNTER — Other Ambulatory Visit: Payer: Self-pay | Admitting: Cardiology

## 2017-03-10 DIAGNOSIS — Z853 Personal history of malignant neoplasm of breast: Secondary | ICD-10-CM

## 2017-03-10 DIAGNOSIS — I4891 Unspecified atrial fibrillation: Secondary | ICD-10-CM

## 2017-03-10 DIAGNOSIS — I10 Essential (primary) hypertension: Secondary | ICD-10-CM

## 2017-03-10 DIAGNOSIS — Z7901 Long term (current) use of anticoagulants: Secondary | ICD-10-CM

## 2017-05-27 ENCOUNTER — Encounter: Payer: Medicare Other | Admitting: Adult Health

## 2017-06-01 ENCOUNTER — Encounter: Payer: Self-pay | Admitting: Adult Health

## 2017-06-01 ENCOUNTER — Inpatient Hospital Stay: Payer: Medicare HMO | Attending: Adult Health | Admitting: Adult Health

## 2017-06-01 VITALS — BP 150/52 | HR 77 | Temp 97.8°F | Resp 20 | Ht 65.0 in | Wt 182.0 lb

## 2017-06-01 DIAGNOSIS — Z9011 Acquired absence of right breast and nipple: Secondary | ICD-10-CM

## 2017-06-01 DIAGNOSIS — Z1239 Encounter for other screening for malignant neoplasm of breast: Secondary | ICD-10-CM

## 2017-06-01 DIAGNOSIS — Z853 Personal history of malignant neoplasm of breast: Secondary | ICD-10-CM | POA: Diagnosis not present

## 2017-06-01 NOTE — Progress Notes (Signed)
CLINIC:  Survivorship   REASON FOR VISIT:  Routine follow-up for history of breast cancer.   BRIEF ONCOLOGIC HISTORY:    Breast cancer, right breast, recurrent   06/03/1987 Initial Diagnosis    original Breast cancer diagnosed 1989, patient had lumpectomy and XRT followed by tamoxifen for 5 years      06/03/2011 Relapse/Recurrence    Recurrance in right breast ER/PR positive HER-2 negative      06/08/2011 Surgery    Right breast mastectomy, multifocal breast cancer ER 100%/PR 71%; Ki 67: 28%; Her 2 Neg ratio 1.48; 1.5 cm and 0.4 cm T1 C. N0 M0 stage IA      02/05/2012 - 01/2017 Anti-estrogen oral therapy    Tamoxifen 20 mg daily started October 2013        INTERVAL HISTORY:  Ms. Everman presents to the [facility] today for routine follow-up for her history of breast cancer.  Overall, she reports feeling quite well. She is accompanied by her daughter.  She stopped Tamoxifen in 01/2017 and hasn't noted much difference.  She is up to date with her mammogram.  She hasn't had a skin check.  She no longer requires colon cancer/cervical cancer screening.  She sees her PCP regularly.     REVIEW OF SYSTEMS:  Review of Systems  Constitutional: Negative for appetite change, chills, fatigue, fever and unexpected weight change.  HENT:   Negative for hearing loss, lump/mass and sore throat.   Eyes: Negative for eye problems and icterus.  Respiratory: Negative for chest tightness, cough and shortness of breath.   Cardiovascular: Negative for chest pain, leg swelling and palpitations.  Gastrointestinal: Negative for abdominal distention, abdominal pain, constipation, diarrhea, nausea and vomiting.  Endocrine: Negative for hot flashes.  Musculoskeletal: Negative for arthralgias.  Skin: Negative for itching and rash.  Neurological: Negative for dizziness, extremity weakness, headaches and numbness.  Hematological: Negative for adenopathy. Does not bruise/bleed easily.    Psychiatric/Behavioral: Negative for depression. The patient is not nervous/anxious.   Breast: Denies any new nodularity, masses, tenderness, nipple changes, or nipple discharge.       PAST MEDICAL/SURGICAL HISTORY:  Past Medical History:  Diagnosis Date  . Arthritis    low spine  . Breast cancer [facility]) 2013   right breast  . Breast cancer, right breast, recurrent 06/03/2011  . Cancer [facility]) 1989   breast right  . Chronic back pain   . Hypertension    Past Surgical History:  Procedure Laterality Date  . ABDOMINAL HYSTERECTOMY  1974  . BREAST SURGERY  1989   rt lumpectomy-axillary dissection  . COLONOSCOPY    . EYE SURGERY     both cataracts  . JOINT REPLACEMENT  2004, 2005   both knees   . MASTECTOMY Right 06/11/11   right breast  . SHOULDER SURGERY     patient does not remember date of procedure     ALLERGIES:  Allergies  Allergen Reactions  . Arimidex [Anastrozole] Other (See Comments)    Neck cramping  . Morphine And Related Nausea And Vomiting  . Vicodin [Hydrocodone-Acetaminophen] Nausea Only     CURRENT MEDICATIONS:  Outpatient Encounter Medications as of 06/01/2017  Medication Sig  . apixaban (ELIQUIS) 5 MG TABS tablet Take 1 tablet (5 mg total) by mouth 2 (two) times daily.  Marland Kitchen atorvastatin (LIPITOR) 20 MG tablet Take 1 tablet (20 mg total) by mouth daily.  . calcium carbonate (OS-CAL) 600 MG TABS Take 600 mg by mouth daily.  . Cholecalciferol (VITAMIN  D3) 1000 UNITS CAPS Take 1,000 Units by mouth daily.   Marland Kitchen diltiazem (CARDIZEM CD) 120 MG 24 hr capsule TAKE 1 CAPSULE BY MOUTH EVERY DAY  . fexofenadine (ALLEGRA) 60 MG tablet Take 60 mg by mouth daily as needed for allergies.   Marland Kitchen gabapentin (NEURONTIN) 100 MG capsule Take 100-300 mg by mouth at bedtime as needed (for neuropathy).   . Ginkgo Biloba 40 MG TABS Take 60 mg by mouth daily.  Marland Kitchen losartan (COZAAR) 50 MG tablet Take 1 tablet (50 mg total) by mouth daily.  . Misc Natural Products (TART CHERRY  ADVANCED) CAPS Take 1 capsule by mouth 2 (two) times daily.   . nitroGLYCERIN (NITROSTAT) 0.4 MG SL tablet Place 1 tablet (0.4 mg total) under the tongue every 5 (five) minutes x 3 doses as needed for chest pain.  . RABEprazole (ACIPHEX) 20 MG tablet Take 1 tablet (20 mg total) by mouth daily.  . sertraline (ZOLOFT) 25 MG tablet Take 25 mg by mouth daily.   . vitamin C (ASCORBIC ACID) 500 MG tablet Take 500 mg by mouth daily.  Marland Kitchen VITAMIN E PO Take 1,000 Units by mouth daily.    No facility-administered encounter medications on file as of 06/01/2017.      ONCOLOGIC FAMILY HISTORY:  Family History  Problem Relation Age of Onset  . Heart disease Mother   . Breast cancer Maternal Aunt   . Cancer Brother        unaware  . Heart disease Brother     SOCIAL HISTORY:  DUSTIN BURRILL is widowed and lives alone in [place], [place].  She has three children who live in [place] and [place].  Ms. Boerema is currently retired  She denies any current or history of tobacco, alcohol, or illicit drug use.     PHYSICAL EXAMINATION:  Vital Signs: Vitals:   06/01/17 1321  BP: (!) 150/52  Pulse: 77  Resp: 20  Temp: 97.8 F (36.6 C)  SpO2: 98%   Filed Weights   06/01/17 1321  Weight: 182 lb (82.6 kg)   General: Well-nourished, well-appearing female in no acute distress.  HEENT: Head is normocephalic.  Pupils equal and reactive to light. Conjunctivae clear without exudate.  Sclerae anicteric. Oral mucosa is pink, moist.  Oropharynx is pink without lesions or erythema.  Lymph: No cervical, supraclavicular, or infraclavicular lymphadenopathy noted on palpation.  Cardiovascular: Regular rate and rhythm.Marland Kitchen Respiratory: Clear to auscultation bilaterally. Chest expansion symmetric; breathing non-labored.  Breast Exam:  -Left breast: No appreciable masses on palpation. No skin redness, thickening, or peau d'orange appearance; no nipple retraction or nipple discharge;  -Right breast:  surgically absent.  -Axilla: No axillary adenopathy bilaterally.  GI: Abdomen soft and round; non-tender, non-distended. Bowel sounds normoactive. No hepatosplenomegaly.   GU: Deferred.  Neuro: No focal deficits. Steady gait.  Psych: Mood and affect normal and appropriate for situation.  MSK: No focal spinal tenderness to palpation, full range of motion in bilateral upper extremities Extremities: No edema. Skin: Warm and dry.  LABORATORY DATA:  None for this visit   DIAGNOSTIC IMAGING:  Most recent mammogram:     ASSESSMENT AND PLAN:  Ms.. Roselli is a pleasant 84 y.o. female with history of initial right breast cancer in 1989 treated with lumpectomy, radiation, and five years of Tamoxifen.  She then had a recurrence in 05/2011 with right breast stage IA, ER+/PR+/HER-2-, treated with right mastectomy and Tamoxifen from 01/2012 to 01/2017.  1. History of breast cancer:  Ms.  Anglemyer is currently clinically and radiographically without evidence of disease or recurrence of breast cancer. She will be due for mammogram in 02/2018; orders placed today. We will see her back in the long term survivorship clinic in one year for routine surveillance and monitoring.   I encouraged her to call me with any questions or concerns before her next visit at the cancer center, and I would be happy to see her sooner, if needed.    2. Bone health:  Given Ms. Cottrell's age, history of breast, she is at risk for bone demineralization. She is due for an upcoming bone density test according to her daughter.  It is scheduled for March, 6, 2019.  She was given education on specific food and activities to promote bone health.  3. Cancer screening:  Due to Ms. Jia's history and her age, she should receive screening for skin cancers. She was encouraged to follow-up with her PCP for appropriate cancer screenings.   4. Health maintenance and wellness promotion: Ms. Feild was encouraged to consume 5-7 servings of fruits and  vegetables per day. She was also encouraged to engage in moderate to vigorous exercise for 30 minutes per day most days of the week. She was instructed to limit her alcohol consumption and continue to abstain from tobacco use.    Dispo:  -Return to cancer center in one year for LTS follow up -Mammogram in 02/2018  A total of (30) minutes of face-to-face time was spent with this patient with greater than 50% of that time in counseling and care-coordination.   Gardenia Phlegm, [place] [phone]   Note: PRIMARY CARE PROVIDER Mayra Neer, [place] [phone]

## 2017-08-07 ENCOUNTER — Other Ambulatory Visit: Payer: Self-pay | Admitting: Cardiology

## 2017-09-01 ENCOUNTER — Other Ambulatory Visit: Payer: Self-pay | Admitting: Cardiology

## 2017-09-21 ENCOUNTER — Other Ambulatory Visit: Payer: Self-pay | Admitting: Cardiovascular Disease

## 2017-09-25 ENCOUNTER — Other Ambulatory Visit: Payer: Self-pay | Admitting: Cardiology

## 2017-09-27 DIAGNOSIS — I480 Paroxysmal atrial fibrillation: Secondary | ICD-10-CM | POA: Diagnosis not present

## 2017-09-27 DIAGNOSIS — Z853 Personal history of malignant neoplasm of breast: Secondary | ICD-10-CM | POA: Diagnosis not present

## 2017-09-27 DIAGNOSIS — K219 Gastro-esophageal reflux disease without esophagitis: Secondary | ICD-10-CM | POA: Diagnosis not present

## 2017-09-27 DIAGNOSIS — I10 Essential (primary) hypertension: Secondary | ICD-10-CM | POA: Diagnosis not present

## 2017-09-27 DIAGNOSIS — M8580 Other specified disorders of bone density and structure, unspecified site: Secondary | ICD-10-CM | POA: Diagnosis not present

## 2017-09-27 DIAGNOSIS — E1169 Type 2 diabetes mellitus with other specified complication: Secondary | ICD-10-CM | POA: Diagnosis not present

## 2017-09-27 DIAGNOSIS — N3281 Overactive bladder: Secondary | ICD-10-CM | POA: Diagnosis not present

## 2017-09-27 DIAGNOSIS — F4321 Adjustment disorder with depressed mood: Secondary | ICD-10-CM | POA: Diagnosis not present

## 2017-09-27 DIAGNOSIS — Z0000 Encounter for general adult medical examination without abnormal findings: Secondary | ICD-10-CM | POA: Diagnosis not present

## 2017-09-27 DIAGNOSIS — E119 Type 2 diabetes mellitus without complications: Secondary | ICD-10-CM | POA: Diagnosis not present

## 2017-09-27 DIAGNOSIS — E782 Mixed hyperlipidemia: Secondary | ICD-10-CM | POA: Diagnosis not present

## 2017-10-24 ENCOUNTER — Other Ambulatory Visit: Payer: Self-pay | Admitting: Cardiology

## 2017-11-21 ENCOUNTER — Other Ambulatory Visit: Payer: Self-pay | Admitting: Cardiology

## 2017-11-22 NOTE — Telephone Encounter (Signed)
Rx request sent to pharmacy.  

## 2017-12-07 ENCOUNTER — Other Ambulatory Visit: Payer: Self-pay | Admitting: Cardiovascular Disease

## 2017-12-07 DIAGNOSIS — Z7901 Long term (current) use of anticoagulants: Secondary | ICD-10-CM

## 2017-12-07 DIAGNOSIS — I10 Essential (primary) hypertension: Secondary | ICD-10-CM

## 2017-12-07 DIAGNOSIS — Z853 Personal history of malignant neoplasm of breast: Secondary | ICD-10-CM

## 2017-12-07 DIAGNOSIS — I4891 Unspecified atrial fibrillation: Secondary | ICD-10-CM

## 2017-12-07 NOTE — Telephone Encounter (Signed)
BMET @ [facility]

## 2017-12-16 ENCOUNTER — Other Ambulatory Visit: Payer: Self-pay | Admitting: Cardiovascular Disease

## 2017-12-29 ENCOUNTER — Ambulatory Visit
Admission: RE | Admit: 2017-12-29 | Discharge: 2017-12-29 | Disposition: A | Discharge status: Home / Self Care | Payer: Medicare HMO | Source: Ambulatory Visit | Attending: Family Medicine | Admitting: Family Medicine

## 2017-12-29 ENCOUNTER — Other Ambulatory Visit: Payer: Self-pay | Admitting: Family Medicine

## 2017-12-29 DIAGNOSIS — M25552 Pain in left hip: Secondary | ICD-10-CM

## 2017-12-29 DIAGNOSIS — M79605 Pain in left leg: Secondary | ICD-10-CM | POA: Diagnosis not present

## 2017-12-29 DIAGNOSIS — M1612 Unilateral primary osteoarthritis, left hip: Secondary | ICD-10-CM | POA: Diagnosis not present

## 2017-12-29 DIAGNOSIS — Z23 Encounter for immunization: Secondary | ICD-10-CM | POA: Diagnosis not present

## 2018-01-18 ENCOUNTER — Other Ambulatory Visit: Payer: Self-pay | Admitting: Cardiovascular Disease

## 2018-01-21 DIAGNOSIS — M542 Cervicalgia: Secondary | ICD-10-CM | POA: Diagnosis not present

## 2018-01-21 DIAGNOSIS — M25562 Pain in left knee: Secondary | ICD-10-CM | POA: Diagnosis not present

## 2018-01-21 DIAGNOSIS — M545 Low back pain: Secondary | ICD-10-CM | POA: Diagnosis not present

## 2018-01-21 DIAGNOSIS — M25552 Pain in left hip: Secondary | ICD-10-CM | POA: Diagnosis not present

## 2018-01-25 DIAGNOSIS — M25562 Pain in left knee: Secondary | ICD-10-CM | POA: Diagnosis not present

## 2018-01-25 DIAGNOSIS — M545 Low back pain: Secondary | ICD-10-CM | POA: Diagnosis not present

## 2018-01-25 DIAGNOSIS — M542 Cervicalgia: Secondary | ICD-10-CM | POA: Diagnosis not present

## 2018-01-25 DIAGNOSIS — M25552 Pain in left hip: Secondary | ICD-10-CM | POA: Diagnosis not present

## 2018-01-26 DIAGNOSIS — M542 Cervicalgia: Secondary | ICD-10-CM | POA: Diagnosis not present

## 2018-01-26 DIAGNOSIS — M545 Low back pain: Secondary | ICD-10-CM | POA: Diagnosis not present

## 2018-01-26 DIAGNOSIS — M25552 Pain in left hip: Secondary | ICD-10-CM | POA: Diagnosis not present

## 2018-01-26 DIAGNOSIS — M25562 Pain in left knee: Secondary | ICD-10-CM | POA: Diagnosis not present

## 2018-01-28 DIAGNOSIS — M25552 Pain in left hip: Secondary | ICD-10-CM | POA: Diagnosis not present

## 2018-01-28 DIAGNOSIS — M25562 Pain in left knee: Secondary | ICD-10-CM | POA: Diagnosis not present

## 2018-01-28 DIAGNOSIS — M542 Cervicalgia: Secondary | ICD-10-CM | POA: Diagnosis not present

## 2018-01-28 DIAGNOSIS — M545 Low back pain: Secondary | ICD-10-CM | POA: Diagnosis not present

## 2018-02-01 DIAGNOSIS — M25562 Pain in left knee: Secondary | ICD-10-CM | POA: Diagnosis not present

## 2018-02-01 DIAGNOSIS — M542 Cervicalgia: Secondary | ICD-10-CM | POA: Diagnosis not present

## 2018-02-01 DIAGNOSIS — M545 Low back pain: Secondary | ICD-10-CM | POA: Diagnosis not present

## 2018-02-01 DIAGNOSIS — M25552 Pain in left hip: Secondary | ICD-10-CM | POA: Diagnosis not present

## 2018-02-02 DIAGNOSIS — M25552 Pain in left hip: Secondary | ICD-10-CM | POA: Diagnosis not present

## 2018-02-02 DIAGNOSIS — M25562 Pain in left knee: Secondary | ICD-10-CM | POA: Diagnosis not present

## 2018-02-02 DIAGNOSIS — M545 Low back pain: Secondary | ICD-10-CM | POA: Diagnosis not present

## 2018-02-02 DIAGNOSIS — M542 Cervicalgia: Secondary | ICD-10-CM | POA: Diagnosis not present

## 2018-02-03 DIAGNOSIS — M545 Low back pain: Secondary | ICD-10-CM | POA: Diagnosis not present

## 2018-02-03 DIAGNOSIS — M25552 Pain in left hip: Secondary | ICD-10-CM | POA: Diagnosis not present

## 2018-02-03 DIAGNOSIS — M25562 Pain in left knee: Secondary | ICD-10-CM | POA: Diagnosis not present

## 2018-02-03 DIAGNOSIS — M542 Cervicalgia: Secondary | ICD-10-CM | POA: Diagnosis not present

## 2018-02-04 ENCOUNTER — Other Ambulatory Visit: Payer: Self-pay | Admitting: Cardiovascular Disease

## 2018-02-04 ENCOUNTER — Other Ambulatory Visit: Payer: Self-pay | Admitting: Cardiology

## 2018-02-04 DIAGNOSIS — Z853 Personal history of malignant neoplasm of breast: Secondary | ICD-10-CM

## 2018-02-04 DIAGNOSIS — I10 Essential (primary) hypertension: Secondary | ICD-10-CM

## 2018-02-04 DIAGNOSIS — Z7901 Long term (current) use of anticoagulants: Secondary | ICD-10-CM

## 2018-02-04 DIAGNOSIS — I4891 Unspecified atrial fibrillation: Secondary | ICD-10-CM

## 2018-02-08 DIAGNOSIS — M25562 Pain in left knee: Secondary | ICD-10-CM | POA: Diagnosis not present

## 2018-02-08 DIAGNOSIS — M545 Low back pain: Secondary | ICD-10-CM | POA: Diagnosis not present

## 2018-02-08 DIAGNOSIS — M25552 Pain in left hip: Secondary | ICD-10-CM | POA: Diagnosis not present

## 2018-02-08 DIAGNOSIS — M542 Cervicalgia: Secondary | ICD-10-CM | POA: Diagnosis not present

## 2018-02-10 DIAGNOSIS — M25552 Pain in left hip: Secondary | ICD-10-CM | POA: Diagnosis not present

## 2018-02-10 DIAGNOSIS — M542 Cervicalgia: Secondary | ICD-10-CM | POA: Diagnosis not present

## 2018-02-10 DIAGNOSIS — M25562 Pain in left knee: Secondary | ICD-10-CM | POA: Diagnosis not present

## 2018-02-10 DIAGNOSIS — M545 Low back pain: Secondary | ICD-10-CM | POA: Diagnosis not present

## 2018-02-11 DIAGNOSIS — M25552 Pain in left hip: Secondary | ICD-10-CM | POA: Diagnosis not present

## 2018-02-11 DIAGNOSIS — M25562 Pain in left knee: Secondary | ICD-10-CM | POA: Diagnosis not present

## 2018-02-11 DIAGNOSIS — R4701 Aphasia: Secondary | ICD-10-CM | POA: Diagnosis not present

## 2018-02-11 DIAGNOSIS — M542 Cervicalgia: Secondary | ICD-10-CM | POA: Diagnosis not present

## 2018-02-11 DIAGNOSIS — M545 Low back pain: Secondary | ICD-10-CM | POA: Diagnosis not present

## 2018-02-11 DIAGNOSIS — R41841 Cognitive communication deficit: Secondary | ICD-10-CM | POA: Diagnosis not present

## 2018-02-11 DIAGNOSIS — R488 Other symbolic dysfunctions: Secondary | ICD-10-CM | POA: Diagnosis not present

## 2018-02-13 ENCOUNTER — Emergency Department (HOSPITAL_COMMUNITY): Payer: Medicare HMO

## 2018-02-13 ENCOUNTER — Emergency Department (HOSPITAL_COMMUNITY)
Admission: EM | Admit: 2018-02-13 | Discharge: 2018-02-13 | Disposition: A | Discharge status: Home / Self Care | Payer: Medicare HMO | Attending: Emergency Medicine | Admitting: Emergency Medicine

## 2018-02-13 ENCOUNTER — Encounter (HOSPITAL_COMMUNITY): Payer: Self-pay

## 2018-02-13 DIAGNOSIS — Z87891 Personal history of nicotine dependence: Secondary | ICD-10-CM | POA: Insufficient documentation

## 2018-02-13 DIAGNOSIS — I10 Essential (primary) hypertension: Secondary | ICD-10-CM | POA: Insufficient documentation

## 2018-02-13 DIAGNOSIS — R0902 Hypoxemia: Secondary | ICD-10-CM | POA: Diagnosis not present

## 2018-02-13 DIAGNOSIS — R079 Chest pain, unspecified: Secondary | ICD-10-CM | POA: Diagnosis not present

## 2018-02-13 DIAGNOSIS — Z7901 Long term (current) use of anticoagulants: Secondary | ICD-10-CM | POA: Diagnosis not present

## 2018-02-13 DIAGNOSIS — Z79899 Other long term (current) drug therapy: Secondary | ICD-10-CM | POA: Insufficient documentation

## 2018-02-13 DIAGNOSIS — I4891 Unspecified atrial fibrillation: Secondary | ICD-10-CM | POA: Diagnosis not present

## 2018-02-13 DIAGNOSIS — R000 Tachycardia, unspecified: Secondary | ICD-10-CM | POA: Diagnosis not present

## 2018-02-13 LAB — BASIC METABOLIC PANEL
Anion gap: 14 (ref 5–15)
BUN: 10 mg/dL (ref 8–23)
CHLORIDE: 104 mmol/L (ref 98–111)
CO2: 22 mmol/L (ref 22–32)
CREATININE: 0.77 mg/dL (ref 0.44–1.00)
Calcium: 9.5 mg/dL (ref 8.9–10.3)
GFR calc Af Amer: >60 mL/min (ref >60)
GFR calc non Af Amer: >60 mL/min (ref >60)
GLUCOSE: 244 mg/dL — AB (ref 70–99)
POTASSIUM: 3.7 mmol/L (ref 3.5–5.1)
Sodium: 140 mmol/L (ref 135–145)

## 2018-02-13 LAB — CBC WITH DIFFERENTIAL/PLATELET
ABS IMMATURE GRANULOCYTES: 0.03 10*3/uL (ref 0.00–0.07)
BASOS PCT: 0 %
Basophils Absolute: 0 10*3/uL (ref 0.0–0.1)
EOS PCT: 1 %
Eosinophils Absolute: 0.1 10*3/uL (ref 0.0–0.5)
HCT: 42.3 % (ref 36.0–46.0)
HEMOGLOBIN: 13.7 g/dL (ref 12.0–15.0)
Immature Granulocytes: 0 %
Lymphocytes Relative: 14 %
Lymphs Abs: 1.6 10*3/uL (ref 0.7–4.0)
MCH: 30.7 pg (ref 26.0–34.0)
MCHC: 32.4 g/dL (ref 30.0–36.0)
MCV: 94.8 fL (ref 80.0–100.0)
MONO ABS: 0.9 10*3/uL (ref 0.1–1.0)
MONOS PCT: 9 %
NEUTROS ABS: 8.2 10*3/uL — AB (ref 1.7–7.7)
Neutrophils Relative %: 76 %
PLATELETS: 224 10*3/uL (ref 150–400)
RBC: 4.46 MIL/uL (ref 3.87–5.11)
RDW: 11.9 % (ref 11.5–15.5)
WBC: 10.9 10*3/uL — AB (ref 4.0–10.5)
nRBC: 0 % (ref 0.0–0.2)

## 2018-02-13 LAB — TROPONIN I

## 2018-02-13 NOTE — ED Triage Notes (Signed)
Pt arrived via [facility]; pt from home with c/o intermittent CP around 7a on L side; pt c/o of lightheaded and dizzy; EMS states rate 140-170 Afib on monitor; slowed briefly down to 80-120 with periodic increases to 170. Hx of Afib per family; pt on blood thinner; pt at ALF [facility]). 130/80, 16; pt rec'd 324 ASA; R side restriction due to Mastectomy, no nitro rec'd.

## 2018-02-13 NOTE — ED Provider Notes (Signed)
[facility] EMERGENCY DEPARTMENT Provider Note   CSN: [id number] Arrival date & time: 02/13/18  1156     History   Chief Complaint Chief Complaint  Patient presents with  . Atrial Fibrillation    HPI [name] is a 84 y.o. female.  HPI Patient presents with left sided chest pain.  Began this morning.  Lightheadedness.  Per EMS reportedly had A. fib with a heart rate of 140-170.  Reported history of A. fib and already is on Eliquis.  Has been taking her medications.  Upon arrival appears to be in a sinus rhythm now.  Feels somewhat better.  No swelling in her legs.  Slight chest pain is improved.  Was sharp on the left side. Past Medical History:  Diagnosis Date  . Arthritis    low spine  . Breast cancer [facility]) 2013   right breast  . Breast cancer, right breast, recurrent 06/03/2011  . Cancer [facility]) 1989   breast right  . Chronic back pain   . Hypertension     Patient Active Problem List   Diagnosis Date Noted  . Atrial fibrillation, new onset ([place]) 09/13/2016  . Chronic anticoagulation 09/13/2016  . Essential hypertension 09/13/2016  . Chest pain with moderate risk of acute coronary syndrome 09/12/2016  . Breast cancer, right breast, recurrent 06/03/2011  . Personal history of malignant neoplasm of breast 04/14/1987    Past Surgical History:  Procedure Laterality Date  . ABDOMINAL HYSTERECTOMY  1974  . BREAST SURGERY  1989   rt lumpectomy-axillary dissection  . COLONOSCOPY    . EYE SURGERY     both cataracts  . JOINT REPLACEMENT  2004, 2005   both knees   . MASTECTOMY Right 06/11/11   right breast  . SHOULDER SURGERY     patient does not remember date of procedure     OB History   None      Home Medications    Prior to Admission medications   Medication Sig Start Date End Date Taking? Authorizing Provider  atorvastatin (LIPITOR) 20 MG tablet TAKE 1 TABLET BY MOUTH EVERY DAY Patient taking differently: Take 20 mg by mouth  daily.  09/21/17  Yes Croitoru, Mihai, MD  calcium carbonate (OS-CAL) 600 MG TABS Take 600 mg by mouth daily.   Yes [provider]  Cholecalciferol (VITAMIN D3) 1000 UNITS CAPS Take 1,000 Units by mouth daily.    Yes [provider]  diltiazem (CARDIZEM CD) 120 MG 24 hr capsule Take 1 capsule (120 mg total) by mouth daily. PT OVERDUE FOR OV PLEASE CALL FOR APPT Patient taking differently: Take 120 mg by mouth daily.  02/07/18  Yes Croitoru, Mihai, MD  ELIQUIS 5 MG TABS tablet TAKE 1 TABLET BY MOUTH TWICE A DAY Patient taking differently: Take 5 mg by mouth 2 (two) times daily.  12/07/17  Yes Croitoru, Mihai, MD  fexofenadine (ALLEGRA) 60 MG tablet Take 60 mg by mouth daily as needed for allergies.    Yes [provider]  gabapentin (NEURONTIN) 100 MG capsule Take 200 mg by mouth at bedtime as needed (for neuropathy).  04/03/11  Yes [provider]  HYDROcodone-acetaminophen (NORCO/VICODIN) 5-325 MG tablet Take 1 tablet by mouth every 8 (eight) hours as needed for pain. 01/25/18  Yes [provider]  losartan (COZAAR) 25 MG tablet TAKE 2 TABLETS (50 MG TOTAL) BY MOUTH DAILY. NEED OV. Patient taking differently: Take 25 mg by mouth daily.  02/07/18  Yes Croitoru, Mihai,  MD  nitroGLYCERIN (NITROSTAT) 0.4 MG SL tablet Place 1 tablet (0.4 mg total) under the tongue every 5 (five) minutes x 3 doses as needed for chest pain. 09/13/16  Yes Kilroy, Luke K, PA-C  predniSONE (DELTASONE) 2.5 MG tablet Take 2.5 mg by mouth daily. 12/27/17  Yes [provider]  RABEprazole (ACIPHEX) 20 MG tablet TAKE 1 TABLET BY MOUTH EVERY DAY Patient taking differently: Take 20 mg by mouth daily.  09/21/17  Yes Croitoru, Mihai, MD  sertraline (ZOLOFT) 50 MG tablet Take 50 mg by mouth daily. 02/04/18  Yes [provider]  vitamin C (ASCORBIC ACID) 500 MG tablet Take 500 mg by mouth daily.   Yes [provider]  VITAMIN E PO Take 1,000 Units by mouth daily.     Yes [provider]    Family History Family History  Problem Relation Age of Onset  . Heart disease Mother   . Breast cancer Maternal Aunt   . Cancer Brother        unaware  . Heart disease Brother     Social History Social History   Tobacco Use  . Smoking status: Former Smoker    Last attempt to quit: 05/11/1970    Years since quitting: 47.7  . Smokeless tobacco: Never Used  Substance Use Topics  . Alcohol use: No  . Drug use: No     Allergies   Arimidex [anastrozole]; Morphine and related; and Vicodin [hydrocodone-acetaminophen]   Review of Systems Review of Systems  Constitutional: Negative for appetite change.  HENT: Negative for congestion.   Respiratory: Positive for shortness of breath.   Cardiovascular: Positive for chest pain.  Gastrointestinal: Negative for abdominal pain.  Genitourinary: Negative for flank pain.  Musculoskeletal: Negative for back pain.  Skin: Negative for rash.  Neurological: Negative for seizures.  Hematological: Negative for adenopathy.  Psychiatric/Behavioral: Negative for confusion.     Physical Exam Updated Vital Signs BP 138/62   Pulse 78   Resp 10   SpO2 98%   Physical Exam  Constitutional: She appears well-developed.  HENT:  Head: Atraumatic.  Eyes: Pupils are equal, round, and reactive to light.  Neck: Neck supple.  Cardiovascular: Normal rate.  Pulmonary/Chest:  Mild tenderness to left anterior and lateral chest wall.  No rash crepitance or deformity.  Abdominal: There is no tenderness.  Musculoskeletal: She exhibits no tenderness.  Neurological: She is alert.  Skin: Skin is warm. Capillary refill takes less than 2 seconds.  Psychiatric: She has a normal mood and affect.     ED Treatments / Results  Labs (all labs ordered are listed, but only abnormal results are displayed) Labs Reviewed  CBC WITH DIFFERENTIAL/PLATELET - Abnormal; Notable for the following components:      Result Value   WBC  10.9 (*)    Neutro Abs 8.2 (*)    All other components within normal limits  BASIC METABOLIC PANEL - Abnormal; Notable for the following components:   Glucose, Bld 244 (*)    All other components within normal limits  TROPONIN I    EKG EKG Interpretation  Date/Time:  Sunday February 13 2018 14:01:13 EST Ventricular Rate:  80 PR Interval:    QRS Duration: 89 QT Interval:  389 QTC Calculation: 449 R Axis:   -25 Text Interpretation:  Sinus rhythm Borderline prolonged PR interval Probable left atrial enlargement Borderline left axis deviation Confirmed by Davonna Belling [phone]) on 02/13/2018 2:05:34 PM   Radiology Dg Chest 2 View  Result Date:  02/13/2018 CLINICAL DATA:  Chest pain. EXAM: CHEST - 2 VIEW COMPARISON:  Chest x-ray dated September 12, 2016. FINDINGS: The heart size and mediastinal contours are within normal limits. Normal pulmonary vascularity. Atherosclerotic calcification of the aortic arch. No focal consolidation, pleural effusion, or pneumothorax. Unchanged moderate hiatal hernia. IMPRESSION: No active cardiopulmonary disease. Electronically Signed   By: Titus Dubin M.D.   On: 02/13/2018 13:22    Procedures Procedures (including critical care time)  Medications Ordered in ED Medications - No data to display   Initial Impression / Assessment and Plan / ED Course  I have reviewed the triage vital signs and the nursing notes.  Pertinent labs & imaging results that were available during my care of the patient were reviewed by me and considered in my medical decision making (see chart for details).     Patient with left-sided chest pain.  Sharp.  Somewhat reproducible.  No rash.  X-ray reassuring.  Reportedly had A. fib with RVR for EMS.  Now back in sinus rhythm.  Already on anticoagulation.  Doubt this pulmonary embolism.  Doubt cardiac ischemia.  Labs reassuring.  Will discharge home.  Final Clinical Impressions(s) / ED Diagnoses   Final diagnoses:  Atrial  fibrillation with RVR ([place])  Chest pain, unspecified type    ED Discharge Orders    None       Davonna Belling, MD 02/13/18 1600

## 2018-02-15 ENCOUNTER — Telehealth (HOSPITAL_COMMUNITY): Payer: Self-pay | Admitting: *Deleted

## 2018-02-15 DIAGNOSIS — M545 Low back pain: Secondary | ICD-10-CM | POA: Diagnosis not present

## 2018-02-15 DIAGNOSIS — R41841 Cognitive communication deficit: Secondary | ICD-10-CM | POA: Diagnosis not present

## 2018-02-15 DIAGNOSIS — M25552 Pain in left hip: Secondary | ICD-10-CM | POA: Diagnosis not present

## 2018-02-15 DIAGNOSIS — M542 Cervicalgia: Secondary | ICD-10-CM | POA: Diagnosis not present

## 2018-02-15 DIAGNOSIS — R488 Other symbolic dysfunctions: Secondary | ICD-10-CM | POA: Diagnosis not present

## 2018-02-15 DIAGNOSIS — R4701 Aphasia: Secondary | ICD-10-CM | POA: Diagnosis not present

## 2018-02-15 DIAGNOSIS — M25562 Pain in left knee: Secondary | ICD-10-CM | POA: Diagnosis not present

## 2018-02-15 DIAGNOSIS — R079 Chest pain, unspecified: Secondary | ICD-10-CM | POA: Diagnosis not present

## 2018-02-15 NOTE — Telephone Encounter (Signed)
Lft vcml on daughters cell for her to clbk to sched her mother an appt for follow up.  Pt was in the ED with afib RVR.  Pt past due for appt with Dr. Orene Desanctis and needs follow up either in his office or ours.

## 2018-02-16 DIAGNOSIS — M25562 Pain in left knee: Secondary | ICD-10-CM | POA: Diagnosis not present

## 2018-02-16 DIAGNOSIS — R488 Other symbolic dysfunctions: Secondary | ICD-10-CM | POA: Diagnosis not present

## 2018-02-16 DIAGNOSIS — M25552 Pain in left hip: Secondary | ICD-10-CM | POA: Diagnosis not present

## 2018-02-16 DIAGNOSIS — R41841 Cognitive communication deficit: Secondary | ICD-10-CM | POA: Diagnosis not present

## 2018-02-16 DIAGNOSIS — M542 Cervicalgia: Secondary | ICD-10-CM | POA: Diagnosis not present

## 2018-02-16 DIAGNOSIS — M545 Low back pain: Secondary | ICD-10-CM | POA: Diagnosis not present

## 2018-02-16 DIAGNOSIS — R4701 Aphasia: Secondary | ICD-10-CM | POA: Diagnosis not present

## 2018-02-17 ENCOUNTER — Telehealth (HOSPITAL_COMMUNITY): Payer: Self-pay | Admitting: *Deleted

## 2018-02-17 NOTE — Telephone Encounter (Signed)
pts daughter called back and stated that they would not the appt at this time because the patient did not have afib in the ED, that they thought it may be shingles but now know its not that either.  She was given the number for Dr. Griffin Dakin office since patient is past due for her appt with him

## 2018-02-18 DIAGNOSIS — R41841 Cognitive communication deficit: Secondary | ICD-10-CM | POA: Diagnosis not present

## 2018-02-18 DIAGNOSIS — M542 Cervicalgia: Secondary | ICD-10-CM | POA: Diagnosis not present

## 2018-02-18 DIAGNOSIS — R488 Other symbolic dysfunctions: Secondary | ICD-10-CM | POA: Diagnosis not present

## 2018-02-18 DIAGNOSIS — M25552 Pain in left hip: Secondary | ICD-10-CM | POA: Diagnosis not present

## 2018-02-18 DIAGNOSIS — M545 Low back pain: Secondary | ICD-10-CM | POA: Diagnosis not present

## 2018-02-18 DIAGNOSIS — M25562 Pain in left knee: Secondary | ICD-10-CM | POA: Diagnosis not present

## 2018-02-18 DIAGNOSIS — R4701 Aphasia: Secondary | ICD-10-CM | POA: Diagnosis not present

## 2018-02-21 DIAGNOSIS — R41841 Cognitive communication deficit: Secondary | ICD-10-CM | POA: Diagnosis not present

## 2018-02-21 DIAGNOSIS — M542 Cervicalgia: Secondary | ICD-10-CM | POA: Diagnosis not present

## 2018-02-21 DIAGNOSIS — M25562 Pain in left knee: Secondary | ICD-10-CM | POA: Diagnosis not present

## 2018-02-21 DIAGNOSIS — M25552 Pain in left hip: Secondary | ICD-10-CM | POA: Diagnosis not present

## 2018-02-21 DIAGNOSIS — M545 Low back pain: Secondary | ICD-10-CM | POA: Diagnosis not present

## 2018-02-21 DIAGNOSIS — R4701 Aphasia: Secondary | ICD-10-CM | POA: Diagnosis not present

## 2018-02-21 DIAGNOSIS — R488 Other symbolic dysfunctions: Secondary | ICD-10-CM | POA: Diagnosis not present

## 2018-02-22 DIAGNOSIS — R4701 Aphasia: Secondary | ICD-10-CM | POA: Diagnosis not present

## 2018-02-22 DIAGNOSIS — M542 Cervicalgia: Secondary | ICD-10-CM | POA: Diagnosis not present

## 2018-02-22 DIAGNOSIS — M545 Low back pain: Secondary | ICD-10-CM | POA: Diagnosis not present

## 2018-02-22 DIAGNOSIS — R41841 Cognitive communication deficit: Secondary | ICD-10-CM | POA: Diagnosis not present

## 2018-02-22 DIAGNOSIS — M25552 Pain in left hip: Secondary | ICD-10-CM | POA: Diagnosis not present

## 2018-02-22 DIAGNOSIS — R488 Other symbolic dysfunctions: Secondary | ICD-10-CM | POA: Diagnosis not present

## 2018-02-22 DIAGNOSIS — M25562 Pain in left knee: Secondary | ICD-10-CM | POA: Diagnosis not present

## 2018-02-24 ENCOUNTER — Ambulatory Visit
Admission: RE | Admit: 2018-02-24 | Discharge: 2018-02-24 | Disposition: A | Discharge status: Home / Self Care | Payer: Medicare HMO | Source: Ambulatory Visit | Attending: Adult Health | Admitting: Adult Health

## 2018-02-24 DIAGNOSIS — Z1239 Encounter for other screening for malignant neoplasm of breast: Secondary | ICD-10-CM

## 2018-02-24 DIAGNOSIS — R4701 Aphasia: Secondary | ICD-10-CM | POA: Diagnosis not present

## 2018-02-24 DIAGNOSIS — M25562 Pain in left knee: Secondary | ICD-10-CM | POA: Diagnosis not present

## 2018-02-24 DIAGNOSIS — Z1231 Encounter for screening mammogram for malignant neoplasm of breast: Secondary | ICD-10-CM | POA: Diagnosis not present

## 2018-02-24 DIAGNOSIS — R488 Other symbolic dysfunctions: Secondary | ICD-10-CM | POA: Diagnosis not present

## 2018-02-24 DIAGNOSIS — M25552 Pain in left hip: Secondary | ICD-10-CM | POA: Diagnosis not present

## 2018-02-24 DIAGNOSIS — M542 Cervicalgia: Secondary | ICD-10-CM | POA: Diagnosis not present

## 2018-02-24 DIAGNOSIS — M545 Low back pain: Secondary | ICD-10-CM | POA: Diagnosis not present

## 2018-02-24 DIAGNOSIS — R41841 Cognitive communication deficit: Secondary | ICD-10-CM | POA: Diagnosis not present

## 2018-02-28 ENCOUNTER — Telehealth: Payer: Self-pay

## 2018-02-28 NOTE — Telephone Encounter (Signed)
Spoke with patient regarding normal 3D mammogram results.  Patient voiced understanding and thanks for call.  No questions/concerns at this time.

## 2018-02-28 NOTE — Telephone Encounter (Signed)
-----   Message from Gardenia Phlegm, NP sent at 02/28/2018  9:17 AM EST ----- Please call patient with normal results :) ----- Message ----- From: Interface, Rad Results In Sent: 02/28/2018   8:41 AM EST To: Gardenia Phlegm, NP

## 2018-03-01 DIAGNOSIS — R4701 Aphasia: Secondary | ICD-10-CM | POA: Diagnosis not present

## 2018-03-01 DIAGNOSIS — M545 Low back pain: Secondary | ICD-10-CM | POA: Diagnosis not present

## 2018-03-01 DIAGNOSIS — M25562 Pain in left knee: Secondary | ICD-10-CM | POA: Diagnosis not present

## 2018-03-01 DIAGNOSIS — R488 Other symbolic dysfunctions: Secondary | ICD-10-CM | POA: Diagnosis not present

## 2018-03-01 DIAGNOSIS — M542 Cervicalgia: Secondary | ICD-10-CM | POA: Diagnosis not present

## 2018-03-01 DIAGNOSIS — R41841 Cognitive communication deficit: Secondary | ICD-10-CM | POA: Diagnosis not present

## 2018-03-01 DIAGNOSIS — M25552 Pain in left hip: Secondary | ICD-10-CM | POA: Diagnosis not present

## 2018-03-03 DIAGNOSIS — R488 Other symbolic dysfunctions: Secondary | ICD-10-CM | POA: Diagnosis not present

## 2018-03-03 DIAGNOSIS — M25552 Pain in left hip: Secondary | ICD-10-CM | POA: Diagnosis not present

## 2018-03-03 DIAGNOSIS — M25562 Pain in left knee: Secondary | ICD-10-CM | POA: Diagnosis not present

## 2018-03-03 DIAGNOSIS — M542 Cervicalgia: Secondary | ICD-10-CM | POA: Diagnosis not present

## 2018-03-03 DIAGNOSIS — M545 Low back pain: Secondary | ICD-10-CM | POA: Diagnosis not present

## 2018-03-03 DIAGNOSIS — R41841 Cognitive communication deficit: Secondary | ICD-10-CM | POA: Diagnosis not present

## 2018-03-03 DIAGNOSIS — R4701 Aphasia: Secondary | ICD-10-CM | POA: Diagnosis not present

## 2018-03-04 DIAGNOSIS — R41841 Cognitive communication deficit: Secondary | ICD-10-CM | POA: Diagnosis not present

## 2018-03-04 DIAGNOSIS — M545 Low back pain: Secondary | ICD-10-CM | POA: Diagnosis not present

## 2018-03-04 DIAGNOSIS — M25552 Pain in left hip: Secondary | ICD-10-CM | POA: Diagnosis not present

## 2018-03-04 DIAGNOSIS — R4701 Aphasia: Secondary | ICD-10-CM | POA: Diagnosis not present

## 2018-03-04 DIAGNOSIS — M542 Cervicalgia: Secondary | ICD-10-CM | POA: Diagnosis not present

## 2018-03-04 DIAGNOSIS — R488 Other symbolic dysfunctions: Secondary | ICD-10-CM | POA: Diagnosis not present

## 2018-03-04 DIAGNOSIS — M25562 Pain in left knee: Secondary | ICD-10-CM | POA: Diagnosis not present

## 2018-03-07 DIAGNOSIS — R488 Other symbolic dysfunctions: Secondary | ICD-10-CM | POA: Diagnosis not present

## 2018-03-07 DIAGNOSIS — M542 Cervicalgia: Secondary | ICD-10-CM | POA: Diagnosis not present

## 2018-03-07 DIAGNOSIS — R41841 Cognitive communication deficit: Secondary | ICD-10-CM | POA: Diagnosis not present

## 2018-03-07 DIAGNOSIS — M545 Low back pain: Secondary | ICD-10-CM | POA: Diagnosis not present

## 2018-03-07 DIAGNOSIS — M25552 Pain in left hip: Secondary | ICD-10-CM | POA: Diagnosis not present

## 2018-03-07 DIAGNOSIS — M25562 Pain in left knee: Secondary | ICD-10-CM | POA: Diagnosis not present

## 2018-03-07 DIAGNOSIS — R4701 Aphasia: Secondary | ICD-10-CM | POA: Diagnosis not present

## 2018-03-08 DIAGNOSIS — R488 Other symbolic dysfunctions: Secondary | ICD-10-CM | POA: Diagnosis not present

## 2018-03-08 DIAGNOSIS — M542 Cervicalgia: Secondary | ICD-10-CM | POA: Diagnosis not present

## 2018-03-08 DIAGNOSIS — R4701 Aphasia: Secondary | ICD-10-CM | POA: Diagnosis not present

## 2018-03-08 DIAGNOSIS — R41841 Cognitive communication deficit: Secondary | ICD-10-CM | POA: Diagnosis not present

## 2018-03-08 DIAGNOSIS — M25562 Pain in left knee: Secondary | ICD-10-CM | POA: Diagnosis not present

## 2018-03-08 DIAGNOSIS — M545 Low back pain: Secondary | ICD-10-CM | POA: Diagnosis not present

## 2018-03-08 DIAGNOSIS — M25552 Pain in left hip: Secondary | ICD-10-CM | POA: Diagnosis not present

## 2018-03-09 DIAGNOSIS — M25562 Pain in left knee: Secondary | ICD-10-CM | POA: Diagnosis not present

## 2018-03-09 DIAGNOSIS — M25552 Pain in left hip: Secondary | ICD-10-CM | POA: Diagnosis not present

## 2018-03-09 DIAGNOSIS — R41841 Cognitive communication deficit: Secondary | ICD-10-CM | POA: Diagnosis not present

## 2018-03-09 DIAGNOSIS — R488 Other symbolic dysfunctions: Secondary | ICD-10-CM | POA: Diagnosis not present

## 2018-03-09 DIAGNOSIS — M542 Cervicalgia: Secondary | ICD-10-CM | POA: Diagnosis not present

## 2018-03-09 DIAGNOSIS — M545 Low back pain: Secondary | ICD-10-CM | POA: Diagnosis not present

## 2018-03-09 DIAGNOSIS — R4701 Aphasia: Secondary | ICD-10-CM | POA: Diagnosis not present

## 2018-03-11 DIAGNOSIS — R41841 Cognitive communication deficit: Secondary | ICD-10-CM | POA: Diagnosis not present

## 2018-03-11 DIAGNOSIS — M542 Cervicalgia: Secondary | ICD-10-CM | POA: Diagnosis not present

## 2018-03-11 DIAGNOSIS — M25552 Pain in left hip: Secondary | ICD-10-CM | POA: Diagnosis not present

## 2018-03-11 DIAGNOSIS — M545 Low back pain: Secondary | ICD-10-CM | POA: Diagnosis not present

## 2018-03-11 DIAGNOSIS — R488 Other symbolic dysfunctions: Secondary | ICD-10-CM | POA: Diagnosis not present

## 2018-03-11 DIAGNOSIS — M25562 Pain in left knee: Secondary | ICD-10-CM | POA: Diagnosis not present

## 2018-03-11 DIAGNOSIS — R4701 Aphasia: Secondary | ICD-10-CM | POA: Diagnosis not present

## 2018-03-15 DIAGNOSIS — M545 Low back pain: Secondary | ICD-10-CM | POA: Diagnosis not present

## 2018-03-15 DIAGNOSIS — R4701 Aphasia: Secondary | ICD-10-CM | POA: Diagnosis not present

## 2018-03-15 DIAGNOSIS — M542 Cervicalgia: Secondary | ICD-10-CM | POA: Diagnosis not present

## 2018-03-15 DIAGNOSIS — M25562 Pain in left knee: Secondary | ICD-10-CM | POA: Diagnosis not present

## 2018-03-15 DIAGNOSIS — R488 Other symbolic dysfunctions: Secondary | ICD-10-CM | POA: Diagnosis not present

## 2018-03-15 DIAGNOSIS — R41841 Cognitive communication deficit: Secondary | ICD-10-CM | POA: Diagnosis not present

## 2018-03-15 DIAGNOSIS — M25552 Pain in left hip: Secondary | ICD-10-CM | POA: Diagnosis not present

## 2018-03-17 DIAGNOSIS — R488 Other symbolic dysfunctions: Secondary | ICD-10-CM | POA: Diagnosis not present

## 2018-03-17 DIAGNOSIS — M542 Cervicalgia: Secondary | ICD-10-CM | POA: Diagnosis not present

## 2018-03-17 DIAGNOSIS — R41841 Cognitive communication deficit: Secondary | ICD-10-CM | POA: Diagnosis not present

## 2018-03-17 DIAGNOSIS — M545 Low back pain: Secondary | ICD-10-CM | POA: Diagnosis not present

## 2018-03-17 DIAGNOSIS — M25552 Pain in left hip: Secondary | ICD-10-CM | POA: Diagnosis not present

## 2018-03-17 DIAGNOSIS — R4701 Aphasia: Secondary | ICD-10-CM | POA: Diagnosis not present

## 2018-03-17 DIAGNOSIS — M25562 Pain in left knee: Secondary | ICD-10-CM | POA: Diagnosis not present

## 2018-03-18 DIAGNOSIS — R488 Other symbolic dysfunctions: Secondary | ICD-10-CM | POA: Diagnosis not present

## 2018-03-18 DIAGNOSIS — R4701 Aphasia: Secondary | ICD-10-CM | POA: Diagnosis not present

## 2018-03-18 DIAGNOSIS — R41841 Cognitive communication deficit: Secondary | ICD-10-CM | POA: Diagnosis not present

## 2018-03-18 DIAGNOSIS — M25552 Pain in left hip: Secondary | ICD-10-CM | POA: Diagnosis not present

## 2018-03-18 DIAGNOSIS — M25562 Pain in left knee: Secondary | ICD-10-CM | POA: Diagnosis not present

## 2018-03-18 DIAGNOSIS — M542 Cervicalgia: Secondary | ICD-10-CM | POA: Diagnosis not present

## 2018-03-18 DIAGNOSIS — M545 Low back pain: Secondary | ICD-10-CM | POA: Diagnosis not present

## 2018-03-22 DIAGNOSIS — R41841 Cognitive communication deficit: Secondary | ICD-10-CM | POA: Diagnosis not present

## 2018-03-22 DIAGNOSIS — R488 Other symbolic dysfunctions: Secondary | ICD-10-CM | POA: Diagnosis not present

## 2018-03-22 DIAGNOSIS — R4701 Aphasia: Secondary | ICD-10-CM | POA: Diagnosis not present

## 2018-03-22 DIAGNOSIS — M25552 Pain in left hip: Secondary | ICD-10-CM | POA: Diagnosis not present

## 2018-03-22 DIAGNOSIS — M25562 Pain in left knee: Secondary | ICD-10-CM | POA: Diagnosis not present

## 2018-03-22 DIAGNOSIS — M542 Cervicalgia: Secondary | ICD-10-CM | POA: Diagnosis not present

## 2018-03-22 DIAGNOSIS — M545 Low back pain: Secondary | ICD-10-CM | POA: Diagnosis not present

## 2018-03-23 DIAGNOSIS — M25552 Pain in left hip: Secondary | ICD-10-CM | POA: Diagnosis not present

## 2018-03-23 DIAGNOSIS — R488 Other symbolic dysfunctions: Secondary | ICD-10-CM | POA: Diagnosis not present

## 2018-03-23 DIAGNOSIS — M25562 Pain in left knee: Secondary | ICD-10-CM | POA: Diagnosis not present

## 2018-03-23 DIAGNOSIS — M542 Cervicalgia: Secondary | ICD-10-CM | POA: Diagnosis not present

## 2018-03-23 DIAGNOSIS — R4701 Aphasia: Secondary | ICD-10-CM | POA: Diagnosis not present

## 2018-03-23 DIAGNOSIS — M545 Low back pain: Secondary | ICD-10-CM | POA: Diagnosis not present

## 2018-03-23 DIAGNOSIS — R41841 Cognitive communication deficit: Secondary | ICD-10-CM | POA: Diagnosis not present

## 2018-03-24 DIAGNOSIS — R41841 Cognitive communication deficit: Secondary | ICD-10-CM | POA: Diagnosis not present

## 2018-03-24 DIAGNOSIS — M545 Low back pain: Secondary | ICD-10-CM | POA: Diagnosis not present

## 2018-03-24 DIAGNOSIS — M25562 Pain in left knee: Secondary | ICD-10-CM | POA: Diagnosis not present

## 2018-03-24 DIAGNOSIS — M25552 Pain in left hip: Secondary | ICD-10-CM | POA: Diagnosis not present

## 2018-03-24 DIAGNOSIS — M542 Cervicalgia: Secondary | ICD-10-CM | POA: Diagnosis not present

## 2018-03-24 DIAGNOSIS — R488 Other symbolic dysfunctions: Secondary | ICD-10-CM | POA: Diagnosis not present

## 2018-03-24 DIAGNOSIS — R4701 Aphasia: Secondary | ICD-10-CM | POA: Diagnosis not present

## 2018-03-25 DIAGNOSIS — M25552 Pain in left hip: Secondary | ICD-10-CM | POA: Diagnosis not present

## 2018-03-25 DIAGNOSIS — M545 Low back pain: Secondary | ICD-10-CM | POA: Diagnosis not present

## 2018-03-25 DIAGNOSIS — R488 Other symbolic dysfunctions: Secondary | ICD-10-CM | POA: Diagnosis not present

## 2018-03-25 DIAGNOSIS — R41841 Cognitive communication deficit: Secondary | ICD-10-CM | POA: Diagnosis not present

## 2018-03-25 DIAGNOSIS — M542 Cervicalgia: Secondary | ICD-10-CM | POA: Diagnosis not present

## 2018-03-25 DIAGNOSIS — R4701 Aphasia: Secondary | ICD-10-CM | POA: Diagnosis not present

## 2018-03-25 DIAGNOSIS — M25562 Pain in left knee: Secondary | ICD-10-CM | POA: Diagnosis not present

## 2018-03-28 DIAGNOSIS — M545 Low back pain: Secondary | ICD-10-CM | POA: Diagnosis not present

## 2018-03-28 DIAGNOSIS — M25552 Pain in left hip: Secondary | ICD-10-CM | POA: Diagnosis not present

## 2018-03-28 DIAGNOSIS — M25562 Pain in left knee: Secondary | ICD-10-CM | POA: Diagnosis not present

## 2018-03-28 DIAGNOSIS — R488 Other symbolic dysfunctions: Secondary | ICD-10-CM | POA: Diagnosis not present

## 2018-03-28 DIAGNOSIS — R41841 Cognitive communication deficit: Secondary | ICD-10-CM | POA: Diagnosis not present

## 2018-03-28 DIAGNOSIS — R4701 Aphasia: Secondary | ICD-10-CM | POA: Diagnosis not present

## 2018-03-28 DIAGNOSIS — M542 Cervicalgia: Secondary | ICD-10-CM | POA: Diagnosis not present

## 2018-03-29 DIAGNOSIS — M542 Cervicalgia: Secondary | ICD-10-CM | POA: Diagnosis not present

## 2018-03-29 DIAGNOSIS — R41841 Cognitive communication deficit: Secondary | ICD-10-CM | POA: Diagnosis not present

## 2018-03-29 DIAGNOSIS — M25552 Pain in left hip: Secondary | ICD-10-CM | POA: Diagnosis not present

## 2018-03-29 DIAGNOSIS — M545 Low back pain: Secondary | ICD-10-CM | POA: Diagnosis not present

## 2018-03-29 DIAGNOSIS — M25562 Pain in left knee: Secondary | ICD-10-CM | POA: Diagnosis not present

## 2018-03-29 DIAGNOSIS — R488 Other symbolic dysfunctions: Secondary | ICD-10-CM | POA: Diagnosis not present

## 2018-03-29 DIAGNOSIS — R4701 Aphasia: Secondary | ICD-10-CM | POA: Diagnosis not present

## 2018-03-30 DIAGNOSIS — M25552 Pain in left hip: Secondary | ICD-10-CM | POA: Diagnosis not present

## 2018-03-30 DIAGNOSIS — M25562 Pain in left knee: Secondary | ICD-10-CM | POA: Diagnosis not present

## 2018-03-30 DIAGNOSIS — M545 Low back pain: Secondary | ICD-10-CM | POA: Diagnosis not present

## 2018-03-30 DIAGNOSIS — R41841 Cognitive communication deficit: Secondary | ICD-10-CM | POA: Diagnosis not present

## 2018-03-30 DIAGNOSIS — M542 Cervicalgia: Secondary | ICD-10-CM | POA: Diagnosis not present

## 2018-03-30 DIAGNOSIS — R4701 Aphasia: Secondary | ICD-10-CM | POA: Diagnosis not present

## 2018-03-30 DIAGNOSIS — R488 Other symbolic dysfunctions: Secondary | ICD-10-CM | POA: Diagnosis not present

## 2018-03-31 DIAGNOSIS — M25562 Pain in left knee: Secondary | ICD-10-CM | POA: Diagnosis not present

## 2018-03-31 DIAGNOSIS — R41841 Cognitive communication deficit: Secondary | ICD-10-CM | POA: Diagnosis not present

## 2018-03-31 DIAGNOSIS — M25552 Pain in left hip: Secondary | ICD-10-CM | POA: Diagnosis not present

## 2018-03-31 DIAGNOSIS — R4701 Aphasia: Secondary | ICD-10-CM | POA: Diagnosis not present

## 2018-03-31 DIAGNOSIS — M542 Cervicalgia: Secondary | ICD-10-CM | POA: Diagnosis not present

## 2018-03-31 DIAGNOSIS — M545 Low back pain: Secondary | ICD-10-CM | POA: Diagnosis not present

## 2018-03-31 DIAGNOSIS — R488 Other symbolic dysfunctions: Secondary | ICD-10-CM | POA: Diagnosis not present

## 2018-04-04 DIAGNOSIS — M542 Cervicalgia: Secondary | ICD-10-CM | POA: Diagnosis not present

## 2018-04-04 DIAGNOSIS — M545 Low back pain: Secondary | ICD-10-CM | POA: Diagnosis not present

## 2018-04-04 DIAGNOSIS — R4701 Aphasia: Secondary | ICD-10-CM | POA: Diagnosis not present

## 2018-04-04 DIAGNOSIS — M25552 Pain in left hip: Secondary | ICD-10-CM | POA: Diagnosis not present

## 2018-04-04 DIAGNOSIS — M25562 Pain in left knee: Secondary | ICD-10-CM | POA: Diagnosis not present

## 2018-04-04 DIAGNOSIS — R488 Other symbolic dysfunctions: Secondary | ICD-10-CM | POA: Diagnosis not present

## 2018-04-04 DIAGNOSIS — R41841 Cognitive communication deficit: Secondary | ICD-10-CM | POA: Diagnosis not present

## 2018-04-05 DIAGNOSIS — M542 Cervicalgia: Secondary | ICD-10-CM | POA: Diagnosis not present

## 2018-04-05 DIAGNOSIS — M25562 Pain in left knee: Secondary | ICD-10-CM | POA: Diagnosis not present

## 2018-04-05 DIAGNOSIS — R4701 Aphasia: Secondary | ICD-10-CM | POA: Diagnosis not present

## 2018-04-05 DIAGNOSIS — M545 Low back pain: Secondary | ICD-10-CM | POA: Diagnosis not present

## 2018-04-05 DIAGNOSIS — R488 Other symbolic dysfunctions: Secondary | ICD-10-CM | POA: Diagnosis not present

## 2018-04-05 DIAGNOSIS — R41841 Cognitive communication deficit: Secondary | ICD-10-CM | POA: Diagnosis not present

## 2018-04-05 DIAGNOSIS — M25552 Pain in left hip: Secondary | ICD-10-CM | POA: Diagnosis not present

## 2018-04-07 DIAGNOSIS — R41841 Cognitive communication deficit: Secondary | ICD-10-CM | POA: Diagnosis not present

## 2018-04-07 DIAGNOSIS — R4701 Aphasia: Secondary | ICD-10-CM | POA: Diagnosis not present

## 2018-04-07 DIAGNOSIS — M542 Cervicalgia: Secondary | ICD-10-CM | POA: Diagnosis not present

## 2018-04-07 DIAGNOSIS — R488 Other symbolic dysfunctions: Secondary | ICD-10-CM | POA: Diagnosis not present

## 2018-04-07 DIAGNOSIS — M25562 Pain in left knee: Secondary | ICD-10-CM | POA: Diagnosis not present

## 2018-04-07 DIAGNOSIS — M545 Low back pain: Secondary | ICD-10-CM | POA: Diagnosis not present

## 2018-04-07 DIAGNOSIS — M25552 Pain in left hip: Secondary | ICD-10-CM | POA: Diagnosis not present

## 2018-04-08 DIAGNOSIS — M25552 Pain in left hip: Secondary | ICD-10-CM | POA: Diagnosis not present

## 2018-04-08 DIAGNOSIS — R41841 Cognitive communication deficit: Secondary | ICD-10-CM | POA: Diagnosis not present

## 2018-04-08 DIAGNOSIS — R4701 Aphasia: Secondary | ICD-10-CM | POA: Diagnosis not present

## 2018-04-08 DIAGNOSIS — M542 Cervicalgia: Secondary | ICD-10-CM | POA: Diagnosis not present

## 2018-04-08 DIAGNOSIS — R488 Other symbolic dysfunctions: Secondary | ICD-10-CM | POA: Diagnosis not present

## 2018-04-08 DIAGNOSIS — M545 Low back pain: Secondary | ICD-10-CM | POA: Diagnosis not present

## 2018-04-08 DIAGNOSIS — M25562 Pain in left knee: Secondary | ICD-10-CM | POA: Diagnosis not present

## 2018-04-09 ENCOUNTER — Other Ambulatory Visit: Payer: Self-pay | Admitting: Cardiovascular Disease

## 2018-04-09 ENCOUNTER — Other Ambulatory Visit: Payer: Self-pay | Admitting: Cardiology

## 2018-04-09 DIAGNOSIS — I4891 Unspecified atrial fibrillation: Secondary | ICD-10-CM

## 2018-04-09 DIAGNOSIS — Z7901 Long term (current) use of anticoagulants: Secondary | ICD-10-CM

## 2018-04-09 DIAGNOSIS — I10 Essential (primary) hypertension: Secondary | ICD-10-CM

## 2018-04-09 DIAGNOSIS — Z853 Personal history of malignant neoplasm of breast: Secondary | ICD-10-CM

## 2018-04-11 DIAGNOSIS — M545 Low back pain: Secondary | ICD-10-CM | POA: Diagnosis not present

## 2018-04-11 DIAGNOSIS — R41841 Cognitive communication deficit: Secondary | ICD-10-CM | POA: Diagnosis not present

## 2018-04-11 DIAGNOSIS — M25552 Pain in left hip: Secondary | ICD-10-CM | POA: Diagnosis not present

## 2018-04-11 DIAGNOSIS — R488 Other symbolic dysfunctions: Secondary | ICD-10-CM | POA: Diagnosis not present

## 2018-04-11 DIAGNOSIS — M542 Cervicalgia: Secondary | ICD-10-CM | POA: Diagnosis not present

## 2018-04-11 DIAGNOSIS — R4701 Aphasia: Secondary | ICD-10-CM | POA: Diagnosis not present

## 2018-04-11 DIAGNOSIS — M25562 Pain in left knee: Secondary | ICD-10-CM | POA: Diagnosis not present

## 2018-04-11 NOTE — Telephone Encounter (Signed)
Overdue for annual follow for further refills

## 2018-04-12 DIAGNOSIS — R41841 Cognitive communication deficit: Secondary | ICD-10-CM | POA: Diagnosis not present

## 2018-04-12 DIAGNOSIS — M25552 Pain in left hip: Secondary | ICD-10-CM | POA: Diagnosis not present

## 2018-04-12 DIAGNOSIS — M545 Low back pain: Secondary | ICD-10-CM | POA: Diagnosis not present

## 2018-04-12 DIAGNOSIS — R488 Other symbolic dysfunctions: Secondary | ICD-10-CM | POA: Diagnosis not present

## 2018-04-12 DIAGNOSIS — R4701 Aphasia: Secondary | ICD-10-CM | POA: Diagnosis not present

## 2018-04-12 DIAGNOSIS — M542 Cervicalgia: Secondary | ICD-10-CM | POA: Diagnosis not present

## 2018-04-12 DIAGNOSIS — M25562 Pain in left knee: Secondary | ICD-10-CM | POA: Diagnosis not present

## 2018-04-14 DIAGNOSIS — M545 Low back pain: Secondary | ICD-10-CM | POA: Diagnosis not present

## 2018-04-14 DIAGNOSIS — R488 Other symbolic dysfunctions: Secondary | ICD-10-CM | POA: Diagnosis not present

## 2018-04-14 DIAGNOSIS — M25562 Pain in left knee: Secondary | ICD-10-CM | POA: Diagnosis not present

## 2018-04-14 DIAGNOSIS — M542 Cervicalgia: Secondary | ICD-10-CM | POA: Diagnosis not present

## 2018-04-14 DIAGNOSIS — R41841 Cognitive communication deficit: Secondary | ICD-10-CM | POA: Diagnosis not present

## 2018-04-14 DIAGNOSIS — R4701 Aphasia: Secondary | ICD-10-CM | POA: Diagnosis not present

## 2018-04-14 DIAGNOSIS — M25552 Pain in left hip: Secondary | ICD-10-CM | POA: Diagnosis not present

## 2018-04-15 DIAGNOSIS — R41841 Cognitive communication deficit: Secondary | ICD-10-CM | POA: Diagnosis not present

## 2018-04-15 DIAGNOSIS — R488 Other symbolic dysfunctions: Secondary | ICD-10-CM | POA: Diagnosis not present

## 2018-04-15 DIAGNOSIS — M542 Cervicalgia: Secondary | ICD-10-CM | POA: Diagnosis not present

## 2018-04-15 DIAGNOSIS — M25562 Pain in left knee: Secondary | ICD-10-CM | POA: Diagnosis not present

## 2018-04-15 DIAGNOSIS — M545 Low back pain: Secondary | ICD-10-CM | POA: Diagnosis not present

## 2018-04-15 DIAGNOSIS — M25552 Pain in left hip: Secondary | ICD-10-CM | POA: Diagnosis not present

## 2018-04-15 DIAGNOSIS — R4701 Aphasia: Secondary | ICD-10-CM | POA: Diagnosis not present

## 2018-04-18 DIAGNOSIS — M25562 Pain in left knee: Secondary | ICD-10-CM | POA: Diagnosis not present

## 2018-04-18 DIAGNOSIS — R4701 Aphasia: Secondary | ICD-10-CM | POA: Diagnosis not present

## 2018-04-18 DIAGNOSIS — M549 Dorsalgia, unspecified: Secondary | ICD-10-CM | POA: Diagnosis not present

## 2018-04-18 DIAGNOSIS — M25552 Pain in left hip: Secondary | ICD-10-CM | POA: Diagnosis not present

## 2018-04-18 DIAGNOSIS — R413 Other amnesia: Secondary | ICD-10-CM | POA: Diagnosis not present

## 2018-04-18 DIAGNOSIS — E119 Type 2 diabetes mellitus without complications: Secondary | ICD-10-CM | POA: Diagnosis not present

## 2018-04-18 DIAGNOSIS — I10 Essential (primary) hypertension: Secondary | ICD-10-CM | POA: Diagnosis not present

## 2018-04-18 DIAGNOSIS — E1169 Type 2 diabetes mellitus with other specified complication: Secondary | ICD-10-CM | POA: Diagnosis not present

## 2018-04-18 DIAGNOSIS — R41841 Cognitive communication deficit: Secondary | ICD-10-CM | POA: Diagnosis not present

## 2018-04-18 DIAGNOSIS — E782 Mixed hyperlipidemia: Secondary | ICD-10-CM | POA: Diagnosis not present

## 2018-04-18 DIAGNOSIS — M542 Cervicalgia: Secondary | ICD-10-CM | POA: Diagnosis not present

## 2018-04-18 DIAGNOSIS — R488 Other symbolic dysfunctions: Secondary | ICD-10-CM | POA: Diagnosis not present

## 2018-04-18 DIAGNOSIS — M545 Low back pain: Secondary | ICD-10-CM | POA: Diagnosis not present

## 2018-04-19 DIAGNOSIS — M25552 Pain in left hip: Secondary | ICD-10-CM | POA: Diagnosis not present

## 2018-04-19 DIAGNOSIS — R4701 Aphasia: Secondary | ICD-10-CM | POA: Diagnosis not present

## 2018-04-19 DIAGNOSIS — R488 Other symbolic dysfunctions: Secondary | ICD-10-CM | POA: Diagnosis not present

## 2018-04-19 DIAGNOSIS — M542 Cervicalgia: Secondary | ICD-10-CM | POA: Diagnosis not present

## 2018-04-19 DIAGNOSIS — R41841 Cognitive communication deficit: Secondary | ICD-10-CM | POA: Diagnosis not present

## 2018-04-19 DIAGNOSIS — M25562 Pain in left knee: Secondary | ICD-10-CM | POA: Diagnosis not present

## 2018-04-19 DIAGNOSIS — M545 Low back pain: Secondary | ICD-10-CM | POA: Diagnosis not present

## 2018-04-22 DIAGNOSIS — M25562 Pain in left knee: Secondary | ICD-10-CM | POA: Diagnosis not present

## 2018-04-22 DIAGNOSIS — R41841 Cognitive communication deficit: Secondary | ICD-10-CM | POA: Diagnosis not present

## 2018-04-22 DIAGNOSIS — M25552 Pain in left hip: Secondary | ICD-10-CM | POA: Diagnosis not present

## 2018-04-22 DIAGNOSIS — R4701 Aphasia: Secondary | ICD-10-CM | POA: Diagnosis not present

## 2018-04-22 DIAGNOSIS — M542 Cervicalgia: Secondary | ICD-10-CM | POA: Diagnosis not present

## 2018-04-22 DIAGNOSIS — R488 Other symbolic dysfunctions: Secondary | ICD-10-CM | POA: Diagnosis not present

## 2018-04-22 DIAGNOSIS — M545 Low back pain: Secondary | ICD-10-CM | POA: Diagnosis not present

## 2018-04-25 DIAGNOSIS — R488 Other symbolic dysfunctions: Secondary | ICD-10-CM | POA: Diagnosis not present

## 2018-04-25 DIAGNOSIS — M25562 Pain in left knee: Secondary | ICD-10-CM | POA: Diagnosis not present

## 2018-04-25 DIAGNOSIS — M542 Cervicalgia: Secondary | ICD-10-CM | POA: Diagnosis not present

## 2018-04-25 DIAGNOSIS — M545 Low back pain: Secondary | ICD-10-CM | POA: Diagnosis not present

## 2018-04-25 DIAGNOSIS — R41841 Cognitive communication deficit: Secondary | ICD-10-CM | POA: Diagnosis not present

## 2018-04-25 DIAGNOSIS — R4701 Aphasia: Secondary | ICD-10-CM | POA: Diagnosis not present

## 2018-04-25 DIAGNOSIS — M25552 Pain in left hip: Secondary | ICD-10-CM | POA: Diagnosis not present

## 2018-04-29 DIAGNOSIS — M25562 Pain in left knee: Secondary | ICD-10-CM | POA: Diagnosis not present

## 2018-04-29 DIAGNOSIS — M542 Cervicalgia: Secondary | ICD-10-CM | POA: Diagnosis not present

## 2018-04-29 DIAGNOSIS — R488 Other symbolic dysfunctions: Secondary | ICD-10-CM | POA: Diagnosis not present

## 2018-04-29 DIAGNOSIS — M545 Low back pain: Secondary | ICD-10-CM | POA: Diagnosis not present

## 2018-04-29 DIAGNOSIS — R41841 Cognitive communication deficit: Secondary | ICD-10-CM | POA: Diagnosis not present

## 2018-04-29 DIAGNOSIS — R4701 Aphasia: Secondary | ICD-10-CM | POA: Diagnosis not present

## 2018-04-29 DIAGNOSIS — M25552 Pain in left hip: Secondary | ICD-10-CM | POA: Diagnosis not present

## 2018-05-02 DIAGNOSIS — M542 Cervicalgia: Secondary | ICD-10-CM | POA: Diagnosis not present

## 2018-05-02 DIAGNOSIS — M25562 Pain in left knee: Secondary | ICD-10-CM | POA: Diagnosis not present

## 2018-05-02 DIAGNOSIS — R488 Other symbolic dysfunctions: Secondary | ICD-10-CM | POA: Diagnosis not present

## 2018-05-02 DIAGNOSIS — R41841 Cognitive communication deficit: Secondary | ICD-10-CM | POA: Diagnosis not present

## 2018-05-02 DIAGNOSIS — M545 Low back pain: Secondary | ICD-10-CM | POA: Diagnosis not present

## 2018-05-02 DIAGNOSIS — M25552 Pain in left hip: Secondary | ICD-10-CM | POA: Diagnosis not present

## 2018-05-02 DIAGNOSIS — R4701 Aphasia: Secondary | ICD-10-CM | POA: Diagnosis not present

## 2018-05-06 DIAGNOSIS — M545 Low back pain: Secondary | ICD-10-CM | POA: Diagnosis not present

## 2018-05-06 DIAGNOSIS — R488 Other symbolic dysfunctions: Secondary | ICD-10-CM | POA: Diagnosis not present

## 2018-05-06 DIAGNOSIS — M542 Cervicalgia: Secondary | ICD-10-CM | POA: Diagnosis not present

## 2018-05-06 DIAGNOSIS — R4701 Aphasia: Secondary | ICD-10-CM | POA: Diagnosis not present

## 2018-05-06 DIAGNOSIS — M25562 Pain in left knee: Secondary | ICD-10-CM | POA: Diagnosis not present

## 2018-05-06 DIAGNOSIS — M25552 Pain in left hip: Secondary | ICD-10-CM | POA: Diagnosis not present

## 2018-05-06 DIAGNOSIS — R41841 Cognitive communication deficit: Secondary | ICD-10-CM | POA: Diagnosis not present

## 2018-05-08 ENCOUNTER — Other Ambulatory Visit: Payer: Self-pay | Admitting: Cardiology

## 2018-05-08 DIAGNOSIS — Z7901 Long term (current) use of anticoagulants: Secondary | ICD-10-CM

## 2018-05-08 DIAGNOSIS — Z853 Personal history of malignant neoplasm of breast: Secondary | ICD-10-CM

## 2018-05-08 DIAGNOSIS — I10 Essential (primary) hypertension: Secondary | ICD-10-CM

## 2018-05-08 DIAGNOSIS — I4891 Unspecified atrial fibrillation: Secondary | ICD-10-CM

## 2018-05-09 DIAGNOSIS — M545 Low back pain: Secondary | ICD-10-CM | POA: Diagnosis not present

## 2018-05-09 DIAGNOSIS — R41841 Cognitive communication deficit: Secondary | ICD-10-CM | POA: Diagnosis not present

## 2018-05-09 DIAGNOSIS — M25562 Pain in left knee: Secondary | ICD-10-CM | POA: Diagnosis not present

## 2018-05-09 DIAGNOSIS — M25552 Pain in left hip: Secondary | ICD-10-CM | POA: Diagnosis not present

## 2018-05-09 DIAGNOSIS — M542 Cervicalgia: Secondary | ICD-10-CM | POA: Diagnosis not present

## 2018-05-09 DIAGNOSIS — R488 Other symbolic dysfunctions: Secondary | ICD-10-CM | POA: Diagnosis not present

## 2018-05-09 DIAGNOSIS — R4701 Aphasia: Secondary | ICD-10-CM | POA: Diagnosis not present

## 2018-05-13 DIAGNOSIS — R41841 Cognitive communication deficit: Secondary | ICD-10-CM | POA: Diagnosis not present

## 2018-05-13 DIAGNOSIS — M545 Low back pain: Secondary | ICD-10-CM | POA: Diagnosis not present

## 2018-05-13 DIAGNOSIS — R4701 Aphasia: Secondary | ICD-10-CM | POA: Diagnosis not present

## 2018-05-13 DIAGNOSIS — M25552 Pain in left hip: Secondary | ICD-10-CM | POA: Diagnosis not present

## 2018-05-13 DIAGNOSIS — M542 Cervicalgia: Secondary | ICD-10-CM | POA: Diagnosis not present

## 2018-05-13 DIAGNOSIS — R488 Other symbolic dysfunctions: Secondary | ICD-10-CM | POA: Diagnosis not present

## 2018-05-13 DIAGNOSIS — M25562 Pain in left knee: Secondary | ICD-10-CM | POA: Diagnosis not present

## 2018-05-16 DIAGNOSIS — R4701 Aphasia: Secondary | ICD-10-CM | POA: Diagnosis not present

## 2018-05-16 DIAGNOSIS — R41841 Cognitive communication deficit: Secondary | ICD-10-CM | POA: Diagnosis not present

## 2018-05-16 DIAGNOSIS — R488 Other symbolic dysfunctions: Secondary | ICD-10-CM | POA: Diagnosis not present

## 2018-05-25 DIAGNOSIS — R4701 Aphasia: Secondary | ICD-10-CM | POA: Diagnosis not present

## 2018-05-25 DIAGNOSIS — R41841 Cognitive communication deficit: Secondary | ICD-10-CM | POA: Diagnosis not present

## 2018-05-25 DIAGNOSIS — R488 Other symbolic dysfunctions: Secondary | ICD-10-CM | POA: Diagnosis not present

## 2018-06-01 DIAGNOSIS — R488 Other symbolic dysfunctions: Secondary | ICD-10-CM | POA: Diagnosis not present

## 2018-06-01 DIAGNOSIS — R41841 Cognitive communication deficit: Secondary | ICD-10-CM | POA: Diagnosis not present

## 2018-06-01 DIAGNOSIS — R4701 Aphasia: Secondary | ICD-10-CM | POA: Diagnosis not present

## 2018-06-02 ENCOUNTER — Inpatient Hospital Stay: Payer: Medicare HMO | Admitting: Adult Health

## 2018-06-02 NOTE — Progress Notes (Deleted)
CLINIC:  Survivorship   REASON FOR VISIT:  Routine follow-up for history of breast cancer.   BRIEF ONCOLOGIC HISTORY:    Breast cancer, right breast, recurrent   06/03/1987 Initial Diagnosis    original Breast cancer diagnosed 1989, patient had lumpectomy and XRT followed by tamoxifen for 5 years    06/03/2011 Relapse/Recurrence    Recurrance in right breast ER/PR positive HER-2 negative    06/08/2011 Surgery    Right breast mastectomy, multifocal breast cancer ER 100%/PR 71%; Ki 67: 28%; Her 2 Neg ratio 1.48; 1.5 cm and 0.4 cm T1 C. N0 M0 stage IA    02/05/2012 - 01/2017 Anti-estrogen oral therapy    Tamoxifen 20 mg daily started October 2013      INTERVAL HISTORY:  [name] presents to the [facility] today for routine follow-up for her history of breast cancer.     REVIEW OF SYSTEMS:  Review of Systems - Oncology  Breast: Denies any new nodularity, masses, tenderness, nipple changes, or nipple discharge.       PAST MEDICAL/SURGICAL HISTORY:  Past Medical History:  Diagnosis Date  . Arthritis    low spine  . Breast cancer [facility]) 2013   right breast  . Breast cancer, right breast, recurrent 06/03/2011  . Cancer [facility]) 1989   breast right  . Chronic back pain   . Hypertension    Past Surgical History:  Procedure Laterality Date  . ABDOMINAL HYSTERECTOMY  1974  . BREAST SURGERY  1989   rt lumpectomy-axillary dissection  . COLONOSCOPY    . EYE SURGERY     both cataracts  . JOINT REPLACEMENT  2004, 2005   both knees   . MASTECTOMY Right 06/11/11   right breast  . SHOULDER SURGERY     patient does not remember date of procedure     ALLERGIES:  Allergies  Allergen Reactions  . Arimidex [Anastrozole] Other (See Comments)    Neck cramping  . Morphine And Related Nausea And Vomiting  . Vicodin [Hydrocodone-Acetaminophen] Nausea Only     CURRENT MEDICATIONS:  Outpatient Encounter Medications as of 06/02/2018  Medication Sig  . atorvastatin  (LIPITOR) 20 MG tablet TAKE 1 TABLET BY MOUTH EVERY DAY (Patient taking differently: Take 20 mg by mouth daily. )  . calcium carbonate (OS-CAL) 600 MG TABS Take 600 mg by mouth daily.  . Cholecalciferol (VITAMIN D3) 1000 UNITS CAPS Take 1,000 Units by mouth daily.   Marland Kitchen diltiazem (CARDIZEM CD) 120 MG 24 hr capsule TAKE 1 CAPSULE BY MOUTH EVERY DAY  . ELIQUIS 5 MG TABS tablet TAKE 1 TABLET BY MOUTH TWICE A DAY (Patient taking differently: Take 5 mg by mouth 2 (two) times daily. )  . fexofenadine (ALLEGRA) 60 MG tablet Take 60 mg by mouth daily as needed for allergies.   Marland Kitchen gabapentin (NEURONTIN) 100 MG capsule Take 200 mg by mouth at bedtime as needed (for neuropathy).   Marland Kitchen HYDROcodone-acetaminophen (NORCO/VICODIN) 5-325 MG tablet Take 1 tablet by mouth every 8 (eight) hours as needed for pain.  Marland Kitchen losartan (COZAAR) 25 MG tablet TAKE 2 TABLETS (50 MG TOTAL) BY MOUTH DAILY. NEED OV.  . nitroGLYCERIN (NITROSTAT) 0.4 MG SL tablet Place 1 tablet (0.4 mg total) under the tongue every 5 (five) minutes x 3 doses as needed for chest pain.  . predniSONE (DELTASONE) 2.5 MG tablet Take 2.5 mg by mouth daily.  . RABEprazole (ACIPHEX) 20 MG tablet TAKE 1 TABLET BY MOUTH EVERY DAY (Patient taking differently: Take  20 mg by mouth daily. )  . sertraline (ZOLOFT) 50 MG tablet Take 50 mg by mouth daily.  . vitamin C (ASCORBIC ACID) 500 MG tablet Take 500 mg by mouth daily.  Marland Kitchen VITAMIN E PO Take 1,000 Units by mouth daily.    No facility-administered encounter medications on file as of 06/02/2018.      ONCOLOGIC FAMILY HISTORY:  Family History  Problem Relation Age of Onset  . Heart disease Mother   . Breast cancer Maternal Aunt   . Cancer Brother        unaware  . Heart disease Brother     SOCIAL HISTORY:  [name] is widowed and lives alone in [place], [place].  She has three children who live in [place] and [place].  [name] is currently retired  She denies any current or history of  tobacco, alcohol, or illicit drug use.     PHYSICAL EXAMINATION:  Vital Signs: There were no vitals filed for this visit. There were no vitals filed for this visit. General: Well-nourished, well-appearing female in no acute distress.  HEENT: Head is normocephalic.  Pupils equal and reactive to light. Conjunctivae clear without exudate.  Sclerae anicteric. Oral mucosa is pink, moist.  Oropharynx is pink without lesions or erythema.  Lymph: No cervical, supraclavicular, or infraclavicular lymphadenopathy noted on palpation.  Cardiovascular: Regular rate and rhythm.Marland Kitchen Respiratory: Clear to auscultation bilaterally. Chest expansion symmetric; breathing non-labored.  Breast Exam:  -Left breast: No appreciable masses on palpation. No skin redness, thickening, or peau d'orange appearance; no nipple retraction or nipple discharge;  -Right breast: surgically absent.  -Axilla: No axillary adenopathy bilaterally.  GI: Abdomen soft and round; non-tender, non-distended. Bowel sounds normoactive. No hepatosplenomegaly.   GU: Deferred.  Neuro: No focal deficits. Steady gait.  Psych: Mood and affect normal and appropriate for situation.  MSK: No focal spinal tenderness to palpation, full range of motion in bilateral upper extremities Extremities: No edema. Skin: Warm and dry.  LABORATORY DATA:  None for this visit   DIAGNOSTIC IMAGING:  Most recent mammogram:     ASSESSMENT AND PLAN:  Ms.. Gay is a pleasant 85 y.o. female with history of initial right breast cancer in 1989 treated with lumpectomy, radiation, and five years of Tamoxifen.  She then had a recurrence in 05/2011 with right breast stage IA, ER+/PR+/HER-2-, treated with right mastectomy and Tamoxifen from 01/2012 to 01/2017.  1. History of breast cancer:  [name] is currently clinically and radiographically without evidence of disease or recurrence of breast cancer. She will be due for mammogram in 02/2019; I encouraged her to call me  with any questions or concerns before her next visit at the cancer center, and I would be happy to see her sooner, if needed.    2. Bone health:  Given Ms. Lyons's age, history of breast, she is at risk for bone demineralization.  She was given education on specific food and activities to promote bone health.  3. Cancer screening:  Due to Ms. Jacquot's history and her age, she should receive screening for skin cancers. She was encouraged to follow-up with her PCP for appropriate cancer screenings.   4. Health maintenance and wellness promotion: Ms. Hack was encouraged to consume 5-7 servings of fruits and vegetables per day. She was also encouraged to engage in moderate to vigorous exercise for 30 minutes per day most days of the week. She was instructed to limit her alcohol consumption and continue to abstain from tobacco  use.    Dispo:  -Return to cancer center in one year for LTS follow up -Mammogram in 02/2019  A total of (30) minutes of face-to-face time was spent with this patient with greater than 50% of that time in counseling and care-coordination.   Gardenia Phlegm, [place] [phone]   Note: PRIMARY CARE PROVIDER Mayra Neer, [place] [phone]

## 2018-06-03 DIAGNOSIS — R4701 Aphasia: Secondary | ICD-10-CM | POA: Diagnosis not present

## 2018-06-03 DIAGNOSIS — R488 Other symbolic dysfunctions: Secondary | ICD-10-CM | POA: Diagnosis not present

## 2018-06-03 DIAGNOSIS — R41841 Cognitive communication deficit: Secondary | ICD-10-CM | POA: Diagnosis not present

## 2018-06-05 ENCOUNTER — Other Ambulatory Visit: Payer: Self-pay | Admitting: Cardiovascular Disease

## 2018-06-06 DIAGNOSIS — R4701 Aphasia: Secondary | ICD-10-CM | POA: Diagnosis not present

## 2018-06-06 DIAGNOSIS — R488 Other symbolic dysfunctions: Secondary | ICD-10-CM | POA: Diagnosis not present

## 2018-06-06 DIAGNOSIS — R41841 Cognitive communication deficit: Secondary | ICD-10-CM | POA: Diagnosis not present

## 2018-06-10 DIAGNOSIS — R488 Other symbolic dysfunctions: Secondary | ICD-10-CM | POA: Diagnosis not present

## 2018-06-10 DIAGNOSIS — R4701 Aphasia: Secondary | ICD-10-CM | POA: Diagnosis not present

## 2018-06-10 DIAGNOSIS — R41841 Cognitive communication deficit: Secondary | ICD-10-CM | POA: Diagnosis not present

## 2018-06-13 DIAGNOSIS — R41841 Cognitive communication deficit: Secondary | ICD-10-CM | POA: Diagnosis not present

## 2018-06-13 DIAGNOSIS — R488 Other symbolic dysfunctions: Secondary | ICD-10-CM | POA: Diagnosis not present

## 2018-06-13 DIAGNOSIS — R4701 Aphasia: Secondary | ICD-10-CM | POA: Diagnosis not present

## 2018-06-14 ENCOUNTER — Other Ambulatory Visit: Payer: Self-pay

## 2018-06-14 DIAGNOSIS — I10 Essential (primary) hypertension: Secondary | ICD-10-CM

## 2018-06-14 DIAGNOSIS — I4891 Unspecified atrial fibrillation: Secondary | ICD-10-CM

## 2018-06-14 DIAGNOSIS — Z853 Personal history of malignant neoplasm of breast: Secondary | ICD-10-CM

## 2018-06-14 DIAGNOSIS — Z7901 Long term (current) use of anticoagulants: Secondary | ICD-10-CM

## 2018-06-14 MED ORDER — DILTIAZEM HCL ER COATED BEADS 120 MG PO CP24: 120.0000 mg | ORAL_CAPSULE | Freq: Every day | ORAL | 0 refills | Status: DC

## 2018-06-17 DIAGNOSIS — R41841 Cognitive communication deficit: Secondary | ICD-10-CM | POA: Diagnosis not present

## 2018-06-17 DIAGNOSIS — R4701 Aphasia: Secondary | ICD-10-CM | POA: Diagnosis not present

## 2018-06-17 DIAGNOSIS — R488 Other symbolic dysfunctions: Secondary | ICD-10-CM | POA: Diagnosis not present

## 2018-06-20 DIAGNOSIS — R41841 Cognitive communication deficit: Secondary | ICD-10-CM | POA: Diagnosis not present

## 2018-06-20 DIAGNOSIS — R4701 Aphasia: Secondary | ICD-10-CM | POA: Diagnosis not present

## 2018-06-20 DIAGNOSIS — R488 Other symbolic dysfunctions: Secondary | ICD-10-CM | POA: Diagnosis not present

## 2018-06-24 DIAGNOSIS — R4701 Aphasia: Secondary | ICD-10-CM | POA: Diagnosis not present

## 2018-06-24 DIAGNOSIS — R41841 Cognitive communication deficit: Secondary | ICD-10-CM | POA: Diagnosis not present

## 2018-06-24 DIAGNOSIS — R488 Other symbolic dysfunctions: Secondary | ICD-10-CM | POA: Diagnosis not present

## 2018-06-27 DIAGNOSIS — R4701 Aphasia: Secondary | ICD-10-CM | POA: Diagnosis not present

## 2018-06-27 DIAGNOSIS — R41841 Cognitive communication deficit: Secondary | ICD-10-CM | POA: Diagnosis not present

## 2018-06-27 DIAGNOSIS — R488 Other symbolic dysfunctions: Secondary | ICD-10-CM | POA: Diagnosis not present

## 2018-07-01 DIAGNOSIS — R4701 Aphasia: Secondary | ICD-10-CM | POA: Diagnosis not present

## 2018-07-01 DIAGNOSIS — R41841 Cognitive communication deficit: Secondary | ICD-10-CM | POA: Diagnosis not present

## 2018-07-01 DIAGNOSIS — R488 Other symbolic dysfunctions: Secondary | ICD-10-CM | POA: Diagnosis not present

## 2018-07-04 DIAGNOSIS — R41841 Cognitive communication deficit: Secondary | ICD-10-CM | POA: Diagnosis not present

## 2018-07-04 DIAGNOSIS — R4701 Aphasia: Secondary | ICD-10-CM | POA: Diagnosis not present

## 2018-07-04 DIAGNOSIS — R488 Other symbolic dysfunctions: Secondary | ICD-10-CM | POA: Diagnosis not present

## 2018-07-08 DIAGNOSIS — R4701 Aphasia: Secondary | ICD-10-CM | POA: Diagnosis not present

## 2018-07-08 DIAGNOSIS — R41841 Cognitive communication deficit: Secondary | ICD-10-CM | POA: Diagnosis not present

## 2018-07-08 DIAGNOSIS — R488 Other symbolic dysfunctions: Secondary | ICD-10-CM | POA: Diagnosis not present

## 2018-07-11 ENCOUNTER — Other Ambulatory Visit: Payer: Self-pay | Admitting: Cardiovascular Disease

## 2018-07-11 DIAGNOSIS — I10 Essential (primary) hypertension: Secondary | ICD-10-CM

## 2018-07-11 DIAGNOSIS — Z853 Personal history of malignant neoplasm of breast: Secondary | ICD-10-CM

## 2018-07-11 DIAGNOSIS — R488 Other symbolic dysfunctions: Secondary | ICD-10-CM | POA: Diagnosis not present

## 2018-07-11 DIAGNOSIS — Z7901 Long term (current) use of anticoagulants: Secondary | ICD-10-CM

## 2018-07-11 DIAGNOSIS — R4701 Aphasia: Secondary | ICD-10-CM | POA: Diagnosis not present

## 2018-07-11 DIAGNOSIS — I4891 Unspecified atrial fibrillation: Secondary | ICD-10-CM

## 2018-07-11 DIAGNOSIS — R41841 Cognitive communication deficit: Secondary | ICD-10-CM | POA: Diagnosis not present

## 2018-07-14 ENCOUNTER — Other Ambulatory Visit: Payer: Self-pay | Admitting: Cardiovascular Disease

## 2018-07-14 DIAGNOSIS — R488 Other symbolic dysfunctions: Secondary | ICD-10-CM | POA: Diagnosis not present

## 2018-07-14 DIAGNOSIS — Z7901 Long term (current) use of anticoagulants: Secondary | ICD-10-CM

## 2018-07-14 DIAGNOSIS — I10 Essential (primary) hypertension: Secondary | ICD-10-CM

## 2018-07-14 DIAGNOSIS — I4891 Unspecified atrial fibrillation: Secondary | ICD-10-CM

## 2018-07-14 DIAGNOSIS — M6281 Muscle weakness (generalized): Secondary | ICD-10-CM | POA: Diagnosis not present

## 2018-07-14 DIAGNOSIS — Z853 Personal history of malignant neoplasm of breast: Secondary | ICD-10-CM

## 2018-07-14 DIAGNOSIS — R278 Other lack of coordination: Secondary | ICD-10-CM | POA: Diagnosis not present

## 2018-07-15 DIAGNOSIS — R488 Other symbolic dysfunctions: Secondary | ICD-10-CM | POA: Diagnosis not present

## 2018-07-15 DIAGNOSIS — R41841 Cognitive communication deficit: Secondary | ICD-10-CM | POA: Diagnosis not present

## 2018-07-15 DIAGNOSIS — R4701 Aphasia: Secondary | ICD-10-CM | POA: Diagnosis not present

## 2018-07-18 DIAGNOSIS — R41841 Cognitive communication deficit: Secondary | ICD-10-CM | POA: Diagnosis not present

## 2018-07-18 DIAGNOSIS — R488 Other symbolic dysfunctions: Secondary | ICD-10-CM | POA: Diagnosis not present

## 2018-07-18 DIAGNOSIS — R4701 Aphasia: Secondary | ICD-10-CM | POA: Diagnosis not present

## 2018-07-19 DIAGNOSIS — M6281 Muscle weakness (generalized): Secondary | ICD-10-CM | POA: Diagnosis not present

## 2018-07-19 DIAGNOSIS — R488 Other symbolic dysfunctions: Secondary | ICD-10-CM | POA: Diagnosis not present

## 2018-07-19 DIAGNOSIS — R278 Other lack of coordination: Secondary | ICD-10-CM | POA: Diagnosis not present

## 2018-07-21 DIAGNOSIS — R278 Other lack of coordination: Secondary | ICD-10-CM | POA: Diagnosis not present

## 2018-07-21 DIAGNOSIS — M6281 Muscle weakness (generalized): Secondary | ICD-10-CM | POA: Diagnosis not present

## 2018-07-21 DIAGNOSIS — R488 Other symbolic dysfunctions: Secondary | ICD-10-CM | POA: Diagnosis not present

## 2018-07-22 DIAGNOSIS — R488 Other symbolic dysfunctions: Secondary | ICD-10-CM | POA: Diagnosis not present

## 2018-07-22 DIAGNOSIS — R4701 Aphasia: Secondary | ICD-10-CM | POA: Diagnosis not present

## 2018-07-22 DIAGNOSIS — R41841 Cognitive communication deficit: Secondary | ICD-10-CM | POA: Diagnosis not present

## 2018-07-25 DIAGNOSIS — R41841 Cognitive communication deficit: Secondary | ICD-10-CM | POA: Diagnosis not present

## 2018-07-25 DIAGNOSIS — R4701 Aphasia: Secondary | ICD-10-CM | POA: Diagnosis not present

## 2018-07-25 DIAGNOSIS — R488 Other symbolic dysfunctions: Secondary | ICD-10-CM | POA: Diagnosis not present

## 2018-07-26 DIAGNOSIS — M6281 Muscle weakness (generalized): Secondary | ICD-10-CM | POA: Diagnosis not present

## 2018-07-26 DIAGNOSIS — R488 Other symbolic dysfunctions: Secondary | ICD-10-CM | POA: Diagnosis not present

## 2018-07-26 DIAGNOSIS — R278 Other lack of coordination: Secondary | ICD-10-CM | POA: Diagnosis not present

## 2018-07-28 DIAGNOSIS — R278 Other lack of coordination: Secondary | ICD-10-CM | POA: Diagnosis not present

## 2018-07-28 DIAGNOSIS — R488 Other symbolic dysfunctions: Secondary | ICD-10-CM | POA: Diagnosis not present

## 2018-07-28 DIAGNOSIS — M6281 Muscle weakness (generalized): Secondary | ICD-10-CM | POA: Diagnosis not present

## 2018-07-29 DIAGNOSIS — R4701 Aphasia: Secondary | ICD-10-CM | POA: Diagnosis not present

## 2018-07-29 DIAGNOSIS — R488 Other symbolic dysfunctions: Secondary | ICD-10-CM | POA: Diagnosis not present

## 2018-07-29 DIAGNOSIS — R41841 Cognitive communication deficit: Secondary | ICD-10-CM | POA: Diagnosis not present

## 2018-08-01 DIAGNOSIS — R4701 Aphasia: Secondary | ICD-10-CM | POA: Diagnosis not present

## 2018-08-01 DIAGNOSIS — R488 Other symbolic dysfunctions: Secondary | ICD-10-CM | POA: Diagnosis not present

## 2018-08-01 DIAGNOSIS — R41841 Cognitive communication deficit: Secondary | ICD-10-CM | POA: Diagnosis not present

## 2018-08-02 DIAGNOSIS — R488 Other symbolic dysfunctions: Secondary | ICD-10-CM | POA: Diagnosis not present

## 2018-08-02 DIAGNOSIS — M6281 Muscle weakness (generalized): Secondary | ICD-10-CM | POA: Diagnosis not present

## 2018-08-02 DIAGNOSIS — R278 Other lack of coordination: Secondary | ICD-10-CM | POA: Diagnosis not present

## 2018-08-04 DIAGNOSIS — R488 Other symbolic dysfunctions: Secondary | ICD-10-CM | POA: Diagnosis not present

## 2018-08-04 DIAGNOSIS — M6281 Muscle weakness (generalized): Secondary | ICD-10-CM | POA: Diagnosis not present

## 2018-08-04 DIAGNOSIS — R278 Other lack of coordination: Secondary | ICD-10-CM | POA: Diagnosis not present

## 2018-08-05 ENCOUNTER — Other Ambulatory Visit: Payer: Self-pay | Admitting: Cardiovascular Disease

## 2018-08-05 DIAGNOSIS — R488 Other symbolic dysfunctions: Secondary | ICD-10-CM | POA: Diagnosis not present

## 2018-08-05 DIAGNOSIS — I10 Essential (primary) hypertension: Secondary | ICD-10-CM

## 2018-08-05 DIAGNOSIS — I4891 Unspecified atrial fibrillation: Secondary | ICD-10-CM

## 2018-08-05 DIAGNOSIS — Z7901 Long term (current) use of anticoagulants: Secondary | ICD-10-CM

## 2018-08-05 DIAGNOSIS — Z853 Personal history of malignant neoplasm of breast: Secondary | ICD-10-CM

## 2018-08-05 DIAGNOSIS — R4701 Aphasia: Secondary | ICD-10-CM | POA: Diagnosis not present

## 2018-08-05 DIAGNOSIS — R41841 Cognitive communication deficit: Secondary | ICD-10-CM | POA: Diagnosis not present

## 2018-08-08 ENCOUNTER — Telehealth: Payer: Self-pay | Admitting: *Deleted

## 2018-08-08 DIAGNOSIS — R488 Other symbolic dysfunctions: Secondary | ICD-10-CM | POA: Diagnosis not present

## 2018-08-08 DIAGNOSIS — R4701 Aphasia: Secondary | ICD-10-CM | POA: Diagnosis not present

## 2018-08-08 DIAGNOSIS — R41841 Cognitive communication deficit: Secondary | ICD-10-CM | POA: Diagnosis not present

## 2018-08-08 NOTE — Telephone Encounter (Signed)
Left message for patient to call and schedule telehealthvisit with Kerin Ransom, PA

## 2018-08-09 DIAGNOSIS — R488 Other symbolic dysfunctions: Secondary | ICD-10-CM | POA: Diagnosis not present

## 2018-08-09 DIAGNOSIS — M6281 Muscle weakness (generalized): Secondary | ICD-10-CM | POA: Diagnosis not present

## 2018-08-09 DIAGNOSIS — R278 Other lack of coordination: Secondary | ICD-10-CM | POA: Diagnosis not present

## 2018-08-11 DIAGNOSIS — R278 Other lack of coordination: Secondary | ICD-10-CM | POA: Diagnosis not present

## 2018-08-11 DIAGNOSIS — R488 Other symbolic dysfunctions: Secondary | ICD-10-CM | POA: Diagnosis not present

## 2018-08-11 DIAGNOSIS — M6281 Muscle weakness (generalized): Secondary | ICD-10-CM | POA: Diagnosis not present

## 2018-08-12 DIAGNOSIS — R41841 Cognitive communication deficit: Secondary | ICD-10-CM | POA: Diagnosis not present

## 2018-08-12 DIAGNOSIS — R4701 Aphasia: Secondary | ICD-10-CM | POA: Diagnosis not present

## 2018-08-12 DIAGNOSIS — R488 Other symbolic dysfunctions: Secondary | ICD-10-CM | POA: Diagnosis not present

## 2018-08-15 DIAGNOSIS — R488 Other symbolic dysfunctions: Secondary | ICD-10-CM | POA: Diagnosis not present

## 2018-08-15 DIAGNOSIS — R4701 Aphasia: Secondary | ICD-10-CM | POA: Diagnosis not present

## 2018-08-15 DIAGNOSIS — R41841 Cognitive communication deficit: Secondary | ICD-10-CM | POA: Diagnosis not present

## 2018-08-16 DIAGNOSIS — R278 Other lack of coordination: Secondary | ICD-10-CM | POA: Diagnosis not present

## 2018-08-16 DIAGNOSIS — M6281 Muscle weakness (generalized): Secondary | ICD-10-CM | POA: Diagnosis not present

## 2018-08-16 DIAGNOSIS — R488 Other symbolic dysfunctions: Secondary | ICD-10-CM | POA: Diagnosis not present

## 2018-08-18 DIAGNOSIS — R488 Other symbolic dysfunctions: Secondary | ICD-10-CM | POA: Diagnosis not present

## 2018-08-18 DIAGNOSIS — M6281 Muscle weakness (generalized): Secondary | ICD-10-CM | POA: Diagnosis not present

## 2018-08-18 DIAGNOSIS — R278 Other lack of coordination: Secondary | ICD-10-CM | POA: Diagnosis not present

## 2018-08-19 DIAGNOSIS — R4701 Aphasia: Secondary | ICD-10-CM | POA: Diagnosis not present

## 2018-08-19 DIAGNOSIS — R41841 Cognitive communication deficit: Secondary | ICD-10-CM | POA: Diagnosis not present

## 2018-08-19 DIAGNOSIS — R488 Other symbolic dysfunctions: Secondary | ICD-10-CM | POA: Diagnosis not present

## 2018-08-19 NOTE — Telephone Encounter (Signed)
Left message for patient to call and schedule virtual/telephoe visit with Kerin Ransom, PA for medication refill per 08/05/18 staff message

## 2018-08-22 DIAGNOSIS — R488 Other symbolic dysfunctions: Secondary | ICD-10-CM | POA: Diagnosis not present

## 2018-08-22 DIAGNOSIS — R4701 Aphasia: Secondary | ICD-10-CM | POA: Diagnosis not present

## 2018-08-22 DIAGNOSIS — R41841 Cognitive communication deficit: Secondary | ICD-10-CM | POA: Diagnosis not present

## 2018-08-24 DIAGNOSIS — H905 Unspecified sensorineural hearing loss: Secondary | ICD-10-CM | POA: Diagnosis not present

## 2018-08-24 DIAGNOSIS — H903 Sensorineural hearing loss, bilateral: Secondary | ICD-10-CM | POA: Diagnosis not present

## 2018-08-25 DIAGNOSIS — R488 Other symbolic dysfunctions: Secondary | ICD-10-CM | POA: Diagnosis not present

## 2018-08-25 DIAGNOSIS — R278 Other lack of coordination: Secondary | ICD-10-CM | POA: Diagnosis not present

## 2018-08-25 DIAGNOSIS — M6281 Muscle weakness (generalized): Secondary | ICD-10-CM | POA: Diagnosis not present

## 2018-08-26 DIAGNOSIS — R41841 Cognitive communication deficit: Secondary | ICD-10-CM | POA: Diagnosis not present

## 2018-08-26 DIAGNOSIS — R488 Other symbolic dysfunctions: Secondary | ICD-10-CM | POA: Diagnosis not present

## 2018-08-26 DIAGNOSIS — R4701 Aphasia: Secondary | ICD-10-CM | POA: Diagnosis not present

## 2018-08-29 ENCOUNTER — Other Ambulatory Visit: Payer: Self-pay | Admitting: Cardiovascular Disease

## 2018-08-29 DIAGNOSIS — R488 Other symbolic dysfunctions: Secondary | ICD-10-CM | POA: Diagnosis not present

## 2018-08-29 DIAGNOSIS — R41841 Cognitive communication deficit: Secondary | ICD-10-CM | POA: Diagnosis not present

## 2018-08-29 DIAGNOSIS — R4701 Aphasia: Secondary | ICD-10-CM | POA: Diagnosis not present

## 2018-08-29 DIAGNOSIS — Z7901 Long term (current) use of anticoagulants: Secondary | ICD-10-CM

## 2018-08-29 DIAGNOSIS — Z853 Personal history of malignant neoplasm of breast: Secondary | ICD-10-CM

## 2018-08-29 DIAGNOSIS — I4891 Unspecified atrial fibrillation: Secondary | ICD-10-CM

## 2018-08-29 DIAGNOSIS — I10 Essential (primary) hypertension: Secondary | ICD-10-CM

## 2018-08-30 DIAGNOSIS — R278 Other lack of coordination: Secondary | ICD-10-CM | POA: Diagnosis not present

## 2018-08-30 DIAGNOSIS — M6281 Muscle weakness (generalized): Secondary | ICD-10-CM | POA: Diagnosis not present

## 2018-08-30 DIAGNOSIS — R488 Other symbolic dysfunctions: Secondary | ICD-10-CM | POA: Diagnosis not present

## 2018-08-31 DIAGNOSIS — R488 Other symbolic dysfunctions: Secondary | ICD-10-CM | POA: Diagnosis not present

## 2018-08-31 DIAGNOSIS — R4701 Aphasia: Secondary | ICD-10-CM | POA: Diagnosis not present

## 2018-08-31 DIAGNOSIS — R41841 Cognitive communication deficit: Secondary | ICD-10-CM | POA: Diagnosis not present

## 2018-09-01 DIAGNOSIS — R488 Other symbolic dysfunctions: Secondary | ICD-10-CM | POA: Diagnosis not present

## 2018-09-01 DIAGNOSIS — R278 Other lack of coordination: Secondary | ICD-10-CM | POA: Diagnosis not present

## 2018-09-01 DIAGNOSIS — M6281 Muscle weakness (generalized): Secondary | ICD-10-CM | POA: Diagnosis not present

## 2018-09-06 DIAGNOSIS — R278 Other lack of coordination: Secondary | ICD-10-CM | POA: Diagnosis not present

## 2018-09-06 DIAGNOSIS — R488 Other symbolic dysfunctions: Secondary | ICD-10-CM | POA: Diagnosis not present

## 2018-09-06 DIAGNOSIS — M6281 Muscle weakness (generalized): Secondary | ICD-10-CM | POA: Diagnosis not present

## 2018-09-07 ENCOUNTER — Encounter: Payer: Self-pay | Admitting: *Deleted

## 2018-09-07 ENCOUNTER — Telehealth: Payer: Self-pay | Admitting: *Deleted

## 2018-09-07 NOTE — Telephone Encounter (Signed)
Patient has not responded to message on 08/08/18 or 08/19/18 to call and schedule telehealth visit for medication refills----lett mailed to patient

## 2018-09-08 DIAGNOSIS — M6281 Muscle weakness (generalized): Secondary | ICD-10-CM | POA: Diagnosis not present

## 2018-09-08 DIAGNOSIS — R488 Other symbolic dysfunctions: Secondary | ICD-10-CM | POA: Diagnosis not present

## 2018-09-08 DIAGNOSIS — R278 Other lack of coordination: Secondary | ICD-10-CM | POA: Diagnosis not present

## 2018-09-14 DIAGNOSIS — R488 Other symbolic dysfunctions: Secondary | ICD-10-CM | POA: Diagnosis not present

## 2018-09-14 DIAGNOSIS — M6281 Muscle weakness (generalized): Secondary | ICD-10-CM | POA: Diagnosis not present

## 2018-09-14 DIAGNOSIS — R278 Other lack of coordination: Secondary | ICD-10-CM | POA: Diagnosis not present

## 2018-09-15 DIAGNOSIS — M6281 Muscle weakness (generalized): Secondary | ICD-10-CM | POA: Diagnosis not present

## 2018-09-15 DIAGNOSIS — R488 Other symbolic dysfunctions: Secondary | ICD-10-CM | POA: Diagnosis not present

## 2018-09-15 DIAGNOSIS — R278 Other lack of coordination: Secondary | ICD-10-CM | POA: Diagnosis not present

## 2018-09-21 DIAGNOSIS — R278 Other lack of coordination: Secondary | ICD-10-CM | POA: Diagnosis not present

## 2018-09-21 DIAGNOSIS — M6281 Muscle weakness (generalized): Secondary | ICD-10-CM | POA: Diagnosis not present

## 2018-09-21 DIAGNOSIS — R488 Other symbolic dysfunctions: Secondary | ICD-10-CM | POA: Diagnosis not present

## 2018-09-22 DIAGNOSIS — R278 Other lack of coordination: Secondary | ICD-10-CM | POA: Diagnosis not present

## 2018-09-22 DIAGNOSIS — M6281 Muscle weakness (generalized): Secondary | ICD-10-CM | POA: Diagnosis not present

## 2018-09-22 DIAGNOSIS — R488 Other symbolic dysfunctions: Secondary | ICD-10-CM | POA: Diagnosis not present

## 2018-09-24 ENCOUNTER — Other Ambulatory Visit: Payer: Self-pay | Admitting: Cardiovascular Disease

## 2018-09-24 DIAGNOSIS — Z853 Personal history of malignant neoplasm of breast: Secondary | ICD-10-CM

## 2018-09-24 DIAGNOSIS — I10 Essential (primary) hypertension: Secondary | ICD-10-CM

## 2018-09-24 DIAGNOSIS — I4891 Unspecified atrial fibrillation: Secondary | ICD-10-CM

## 2018-09-24 DIAGNOSIS — Z7901 Long term (current) use of anticoagulants: Secondary | ICD-10-CM

## 2018-09-28 DIAGNOSIS — R278 Other lack of coordination: Secondary | ICD-10-CM | POA: Diagnosis not present

## 2018-09-28 DIAGNOSIS — R488 Other symbolic dysfunctions: Secondary | ICD-10-CM | POA: Diagnosis not present

## 2018-09-28 DIAGNOSIS — M6281 Muscle weakness (generalized): Secondary | ICD-10-CM | POA: Diagnosis not present

## 2018-09-29 DIAGNOSIS — M6281 Muscle weakness (generalized): Secondary | ICD-10-CM | POA: Diagnosis not present

## 2018-09-29 DIAGNOSIS — R488 Other symbolic dysfunctions: Secondary | ICD-10-CM | POA: Diagnosis not present

## 2018-09-29 DIAGNOSIS — R278 Other lack of coordination: Secondary | ICD-10-CM | POA: Diagnosis not present

## 2018-10-02 ENCOUNTER — Other Ambulatory Visit: Payer: Self-pay

## 2018-10-02 ENCOUNTER — Emergency Department (HOSPITAL_COMMUNITY): Payer: Medicare HMO

## 2018-10-02 ENCOUNTER — Encounter (HOSPITAL_COMMUNITY): Payer: Medicare HMO

## 2018-10-02 ENCOUNTER — Emergency Department (HOSPITAL_COMMUNITY)
Admission: EM | Admit: 2018-10-02 | Discharge: 2018-10-02 | Disposition: A | Discharge status: Home / Self Care | Payer: Medicare HMO | Attending: Emergency Medicine | Admitting: Emergency Medicine

## 2018-10-02 DIAGNOSIS — Z7901 Long term (current) use of anticoagulants: Secondary | ICD-10-CM | POA: Diagnosis not present

## 2018-10-02 DIAGNOSIS — I10 Essential (primary) hypertension: Secondary | ICD-10-CM | POA: Diagnosis not present

## 2018-10-02 DIAGNOSIS — Z79899 Other long term (current) drug therapy: Secondary | ICD-10-CM | POA: Insufficient documentation

## 2018-10-02 DIAGNOSIS — R0789 Other chest pain: Secondary | ICD-10-CM | POA: Diagnosis not present

## 2018-10-02 DIAGNOSIS — R51 Headache: Secondary | ICD-10-CM | POA: Diagnosis not present

## 2018-10-02 DIAGNOSIS — Z96653 Presence of artificial knee joint, bilateral: Secondary | ICD-10-CM | POA: Insufficient documentation

## 2018-10-02 DIAGNOSIS — I4891 Unspecified atrial fibrillation: Secondary | ICD-10-CM | POA: Diagnosis not present

## 2018-10-02 DIAGNOSIS — R0902 Hypoxemia: Secondary | ICD-10-CM | POA: Diagnosis not present

## 2018-10-02 DIAGNOSIS — R079 Chest pain, unspecified: Secondary | ICD-10-CM | POA: Insufficient documentation

## 2018-10-02 DIAGNOSIS — R000 Tachycardia, unspecified: Secondary | ICD-10-CM | POA: Diagnosis not present

## 2018-10-02 LAB — CBC
HCT: 39.9 % (ref 36.0–46.0)
Hemoglobin: 12.8 g/dL (ref 12.0–15.0)
MCH: 30.7 pg (ref 26.0–34.0)
MCHC: 32.1 g/dL (ref 30.0–36.0)
MCV: 95.7 fL (ref 80.0–100.0)
Platelets: 242 10*3/uL (ref 150–400)
RBC: 4.17 MIL/uL (ref 3.87–5.11)
RDW: 12.5 % (ref 11.5–15.5)
WBC: 9 10*3/uL (ref 4.0–10.5)
nRBC: 0 % (ref 0.0–0.2)

## 2018-10-02 LAB — URINALYSIS, ROUTINE W REFLEX MICROSCOPIC
Bacteria, UA: NONE SEEN
Bilirubin Urine: NEGATIVE
Glucose, UA: 50 mg/dL — AB
Hgb urine dipstick: NEGATIVE
Ketones, ur: NEGATIVE mg/dL
Nitrite: NEGATIVE
Protein, ur: NEGATIVE mg/dL
Specific Gravity, Urine: 1.018 (ref 1.005–1.030)
pH: 5 (ref 5.0–8.0)

## 2018-10-02 LAB — BASIC METABOLIC PANEL
Anion gap: 10 (ref 5–15)
BUN: 12 mg/dL (ref 8–23)
CO2: 23 mmol/L (ref 22–32)
Calcium: 9.2 mg/dL (ref 8.9–10.3)
Chloride: 106 mmol/L (ref 98–111)
Creatinine, Ser: 0.79 mg/dL (ref 0.44–1.00)
GFR calc Af Amer: >60 mL/min (ref >60)
GFR calc non Af Amer: >60 mL/min (ref >60)
Glucose, Bld: 234 mg/dL — ABNORMAL HIGH (ref 70–99)
Potassium: 3.5 mmol/L (ref 3.5–5.1)
Sodium: 139 mmol/L (ref 135–145)

## 2018-10-02 LAB — TROPONIN I
Troponin I: <0.03 ng/mL (ref <0.03)
Troponin I: <0.03 ng/mL (ref <0.03)

## 2018-10-02 NOTE — Discharge Instructions (Addendum)
You have been seen today for chest pain. Please read and follow all provided instructions.   1. Medications: usual home medications 2. Treatment: rest, drink plenty of fluids 3. Follow Up: Please call and schedule an appointment with your cardiologist. Please follow up with your primary doctor in 2-5 days for discussion of your diagnoses and further evaluation after today's visit; if you do not have a primary care doctor use the resource guide provided to find one; Please return to the ER for any new or worsening symptoms. Please obtain all of your results from medical records or have your doctors office obtain the results - share them with your doctor - you should be seen at your doctors office. Call today to arrange your follow up.   You should return to the ER if you develop severe or worsening symptoms.   Emergency Department Resource Guide 1) Find a Doctor and Pay Out of Pocket Although you won't have to find out who is covered by your insurance plan, it is a good idea to ask around and get recommendations. You will then need to call the office and see if the doctor you have chosen will accept you as a new patient and what types of options they offer for patients who are self-pay. Some doctors offer discounts or will set up payment plans for their patients who do not have insurance, but you will need to ask so you aren't surprised when you get to your appointment.  2) Contact Your Local Health Department Not all health departments have doctors that can see patients for sick visits, but many do, so it is worth a call to see if yours does. If you don't know where your local health department is, you can check in your phone book. The CDC also has a tool to help you locate your state's health department, and many state websites also have listings of all of their local health departments.  3) Find a [facility] If your illness is not likely to be very severe or complicated, you may want to try a  walk in clinic. These are popping up all over the country in pharmacies, drugstores, and shopping centers. They're usually staffed by nurse practitioners or physician assistants that have been trained to treat common illnesses and complaints. They're usually fairly quick and inexpensive. However, if you have serious medical issues or chronic medical problems, these are probably not your best option.  No Primary Care Doctor: Call Health Connect at  [phone] - they can help you locate a primary care doctor that  accepts your insurance, provides certain services, etc. Physician Referral Service- [phone]  Chronic Pain Problems: Organization         Address  Phone   Notes  [facility]  [phone] Patients need to be referred by their primary care doctor.   Medication Assistance: Organization         Address  Phone   Notes  [facility] Medication [facility] [address] [place], [place] [address] [phone] --Must be a resident of [facility] -- Must have NO insurance coverage whatsoever (no Medicaid/ Medicare, etc.) -- The pt. MUST have a primary care doctor that directs their care regularly and follows them in the community   [facility]  [phone]   [employer]  [phone]    Agencies that provide inexpensive medical care: Organization         Address  Phone   Notes  [facility]  [phone]   [facility]    [phone]   [facility] [place], [place] [address] [phone]   [place] [address], [place] [phone]   [facility]    [phone]   [facility]    [phone]   [place] and [place] [address], [place] Phone:  [phone], Fax:  [phone] Hours of Operation:  9 am - 6 pm, M-F.  Also accepts Medicaid/Medicare and self-pay.  [facility] for [place], [address], [place] Phone: [phone], Fax: [phone]. Hours of Operation:  8:30 am - 5:30 pm, M-F.  Also accepts Medicaid and self-pay.  [facility] [place] [address], [place] Phone: [phone]   [place], [place], [place] [phone], Ext. [phone] Mondays & Thursdays: 7-9 AM.  First 15 patients are seen on a first come, first serve basis.    [place] Providers:  Organization         Address  Phone   Notes  [facility] [address], [place] [phone] Also accepts self-pay patients.  [facility] [address], [place]  [phone]   [place], [address], [place] [phone]   [facility] [address], [place] [phone]   Lucianne Lei [address], [place]   [phone] Only accepts [place] patients after they have their name applied to their card.   Self-Pay (no insurance) in [facility]:  Organization         Address  Phone   Notes  Sickle Cell Patients, [facility] [place] [phone]   [facility] Urgent Care [place] [phone]   [facility] [place]  [place], [place], [place] [phone]   [facility]/Dr. Osei-Bonsu  [address] [place] or [address], [place] [phone] Phone number for both [place] and [place] locations is the same.  [facility] [address], [place] [phone]   [facility] [address] [place] or [address] [phone]   [facility] [address] [place] [phone], phone; [phone], fax Sees  patients 1st and 3rd Saturday of every month.  Must not qualify for public or private insurance (i.e. Medicaid, Medicare,  Health Choice, Veterans' Benefits)  Household income should be no more than 200% of the poverty level The clinic cannot treat you if you are pregnant or think you are pregnant  Sexually transmitted diseases are not treated at the clinic.

## 2018-10-02 NOTE — ED Provider Notes (Signed)
[facility] EMERGENCY DEPARTMENT Provider Note   CSN: [id number] Arrival date & time: 10/02/18  1014    History   Chief Complaint Chief Complaint  Patient presents with  . Chest Pain    HPI [name] is a 85 y.o. female with a PMH of atrial fibrillation on Eliquis, Breast Cancer in 2013, HTN, and Chronic back pain presenting with intermittent sharp central chest pain radiating to neck onset at 8-9am this morning. Patient arrived via EMS from [place]. EMS provided ASA and nitroglycerin prior to arrival. Patient reports she started having sharp chest pain after her usual morning routine of getting ready and eating breakfast. Patient reports taking her medications as prescribed. Patient reports associated shortness of breath. Patient denies fever, cough, congestion, sick exposures, or recent travel. Patient reports intermittent left leg edema over the last year. Patient denies leg pain. Patient denies injuries. Patient denies dysuria, abdominal pain, nausea, vomiting, or diarrhea. Patient denies weakness, syncope, or dizziness. Patient denies any symptoms currently. Patient denies tobacco, alcohol, or drug use. Patient's Cardiologist is Dr. Orene Desanctis.      HPI  Past Medical History:  Diagnosis Date  . Arthritis    low spine  . Breast cancer [facility]) 2013   right breast  . Breast cancer, right breast, recurrent 06/03/2011  . Cancer [facility]) 1989   breast right  . Chronic back pain   . Hypertension     Patient Active Problem List   Diagnosis Date Noted  . Atrial fibrillation, new onset ([place]) 09/13/2016  . Chronic anticoagulation 09/13/2016  . Essential hypertension 09/13/2016  . Chest pain with moderate risk of acute coronary syndrome 09/12/2016  . Breast cancer, right breast, recurrent 06/03/2011  . Personal history of malignant neoplasm of breast 04/14/1987    Past Surgical History:  Procedure Laterality Date  . ABDOMINAL HYSTERECTOMY  1974  .  BREAST SURGERY  1989   rt lumpectomy-axillary dissection  . COLONOSCOPY    . EYE SURGERY     both cataracts  . JOINT REPLACEMENT  2004, 2005   both knees   . MASTECTOMY Right 06/11/11   right breast  . SHOULDER SURGERY     patient does not remember date of procedure     OB History   No obstetric history on file.      Home Medications    Prior to Admission medications   Medication Sig Start Date End Date Taking? Authorizing Provider  atorvastatin (LIPITOR) 20 MG tablet TAKE 1 TABLET BY MOUTH EVERY DAY Patient taking differently: Take 20 mg by mouth daily.  09/21/17   Croitoru, Mihai, MD  calcium carbonate (OS-CAL) 600 MG TABS Take 600 mg by mouth daily.    [provider]  Cholecalciferol (VITAMIN D3) 1000 UNITS CAPS Take 1,000 Units by mouth daily.     [provider]  diltiazem (CARDIZEM CD) 120 MG 24 hr capsule TAKE 1 CAPSULE (120 MG TOTAL) BY MOUTH DAILY. PLEASE MAKE OVERDUE APPT 09/26/18   Croitoru, Mihai, MD  ELIQUIS 5 MG TABS tablet TAKE 1 TABLET BY MOUTH TWICE A DAY 07/14/18   Croitoru, Mihai, MD  fexofenadine (ALLEGRA) 60 MG tablet Take 60 mg by mouth daily as needed for allergies.     [provider]  gabapentin (NEURONTIN) 100 MG capsule Take 200 mg by mouth at bedtime as needed (for neuropathy).  04/03/11   [provider]  HYDROcodone-acetaminophen (NORCO/VICODIN) 5-325 MG tablet Take 1 tablet by mouth every 8 (eight) hours  as needed for pain. 01/25/18   [provider]  losartan (COZAAR) 25 MG tablet TAKE 2 TABLETS BY MOUTH (50MG  TOTAL) EVERY DAY PT NEEDS OFFICE VISIT 06/07/18   Croitoru, Dani Gobble, MD  nitroGLYCERIN (NITROSTAT) 0.4 MG SL tablet Place 1 tablet (0.4 mg total) under the tongue every 5 (five) minutes x 3 doses as needed for chest pain. 09/13/16   Erlene Quan, PA-C  predniSONE (DELTASONE) 2.5 MG tablet Take 2.5 mg by mouth daily. 12/27/17   [provider]  RABEprazole (ACIPHEX) 20 MG tablet TAKE 1 TABLET BY  MOUTH EVERY DAY Patient taking differently: Take 20 mg by mouth daily.  09/21/17   Croitoru, Mihai, MD  sertraline (ZOLOFT) 50 MG tablet Take 50 mg by mouth daily. 02/04/18   [provider]  vitamin C (ASCORBIC ACID) 500 MG tablet Take 500 mg by mouth daily.    [provider]  VITAMIN E PO Take 1,000 Units by mouth daily.     [provider]    Family History Family History  Problem Relation Age of Onset  . Heart disease Mother   . Breast cancer Maternal Aunt   . Cancer Brother        unaware  . Heart disease Brother     Social History Social History   Tobacco Use  . Smoking status: Former Smoker    Quit date: 05/11/1970    Years since quitting: 48.4  . Smokeless tobacco: Never Used  Substance Use Topics  . Alcohol use: No  . Drug use: No     Allergies   Arimidex [anastrozole], Morphine and related, and Vicodin [hydrocodone-acetaminophen]   Review of Systems Review of Systems  Constitutional: Negative for activity change, appetite change, chills, diaphoresis, fatigue, fever and unexpected weight change.  HENT: Negative for congestion and rhinorrhea.   Respiratory: Positive for shortness of breath. Negative for cough, chest tightness and wheezing.   Cardiovascular: Positive for chest pain and leg swelling. Negative for palpitations.  Gastrointestinal: Negative for abdominal pain, nausea and vomiting.  Endocrine: Negative for cold intolerance and heat intolerance.  Genitourinary: Negative for dysuria and frequency.  Musculoskeletal: Negative for back pain.  Skin: Negative for rash.  Allergic/Immunologic: Negative for immunocompromised state.  Neurological: Negative for dizziness, syncope, weakness and light-headedness.  Psychiatric/Behavioral: Negative for agitation and behavioral problems. The patient is not nervous/anxious.      Physical Exam Updated Vital Signs BP (!) 163/64 (BP Location: Right Arm)   Pulse 68   Temp 97.6 F (36.4  C) (Oral)   Resp (!) 22   Ht 5\' 5"  (1.651 m)   Wt 82.6 kg   SpO2 97%   BMI 30.30 kg/m   Physical Exam Vitals signs and nursing note reviewed.  Constitutional:      General: She is not in acute distress.    Appearance: She is well-developed. She is not diaphoretic.  HENT:     Head: Normocephalic and atraumatic.  Eyes:     Extraocular Movements: Extraocular movements intact.     Pupils: Pupils are equal, round, and reactive to light.  Neck:     Musculoskeletal: Normal range of motion and neck supple.     Vascular: No JVD.  Cardiovascular:     Rate and Rhythm: Tachycardia present. Rhythm irregular.     Pulses: Normal pulses.          Radial pulses are 2+ on the right side and 2+ on the left side.  Dorsalis pedis pulses are 2+ on the right side and 2+ on the left side.     Heart sounds: Normal heart sounds. No murmur. No friction rub. No gallop.   Pulmonary:     Effort: Pulmonary effort is normal. No respiratory distress.     Breath sounds: Normal breath sounds. No wheezing, rhonchi or rales.  Chest:     Chest wall: No tenderness.  Abdominal:     Palpations: Abdomen is soft.     Tenderness: There is no abdominal tenderness.  Musculoskeletal: Normal range of motion.     Right lower leg: She exhibits no tenderness. No edema.     Left lower leg: She exhibits no tenderness. No edema.     Comments: No leg edema noted.  Skin:    General: Skin is warm.     Capillary Refill: Capillary refill takes less than 2 seconds.     Coloration: Skin is not pale.     Findings: No rash.  Neurological:     Mental Status: She is alert.    ED Treatments / Results  Labs (all labs ordered are listed, but only abnormal results are displayed) Labs Reviewed  BASIC METABOLIC PANEL - Abnormal; Notable for the following components:      Result Value   Glucose, Bld 234 (*)    All other components within normal limits  URINALYSIS, ROUTINE W REFLEX MICROSCOPIC - Abnormal; Notable for the  following components:   APPearance HAZY (*)    Glucose, UA 50 (*)    Leukocytes,Ua LARGE (*)    All other components within normal limits  URINE CULTURE  CBC  TROPONIN I  TROPONIN I    EKG EKG Interpretation  Date/Time:  Sunday October 02 2018 12:45:59 EDT Ventricular Rate:  74 PR Interval:    QRS Duration: 83 QT Interval:  394 QTC Calculation: 438 R Axis:   -14 Text Interpretation:  Sinus rhythm Borderline prolonged PR interval Baseline wander in lead(s) V2 afib from earlier tracing resolved, no ischemic changes Confirmed by Charlesetta Shanks [phone]) on 10/02/2018 1:57:15 PM   Radiology Dg Chest Port 1 View  Result Date: 10/02/2018 CLINICAL DATA:  Chest pain centrally since this morning. EXAM: PORTABLE CHEST 1 VIEW COMPARISON:  02/13/2018 FINDINGS: Lungs are adequately inflated without consolidation or effusion. Known moderate size hiatal hernia. Cardiomediastinal silhouette and remainder of the exam is unchanged. IMPRESSION: No active disease. Moderate size hiatal hernia. Electronically Signed   By: Marin Olp M.D.   On: 10/02/2018 11:45    Procedures Procedures (including critical care time)  Medications Ordered in ED Medications - No data to display   Initial Impression / Assessment and Plan / ED Course  I have reviewed the triage vital signs and the nursing notes.  Pertinent labs & imaging results that were available during my care of the patient were reviewed by me and considered in my medical decision making (see chart for details).  Clinical Course as of Oct 02 1534  Sun Oct 02, 2018  1137 CBC is unremarkable.  CBC [AH]  1148 No active disease. Moderate size hiatal hernia.    DG Chest Port 1 View [AH]  1305 Large leukocytes and glucose noted on UA. WBCs and RBCs noted. Squamous epithelial cells present. Will order urine culture. Patient denies any symptoms.   Leukocytes,Ua(!): LARGE [AH]  8588 Second troponin is negative.   Troponin I - ONCE - STAT [AH]   5027 Discussed plan with daughter. Daughter states she understands and  agrees with plan.    [AH]    Clinical Course User Index [AH] Arville Lime, [place]      Patient presents with chest pain. Patient's symptoms resolved prior to arrival. Patient has not had any chest pain while in the ER. Patient was in atrial fibrillation in the low 100s upon arrival and converted spontaneously. Patient is now sinus rhythm.   Patient without tracheal deviation, no JVD or new murmur, RRR, breath sounds equal bilaterally, EKG without ischemic changes, negative troponin x2, and negative CXR. Do not suspect PE at this time. Patient is to be discharged with recommendation to follow up with PCP in regards to today's hospital visit. Pt has been advised to return to the ED if CP becomes exertional, associated with diaphoresis or nausea, radiates to left jaw/arm, worsens or becomes concerning in any way. Advised patient to follow up with cardiologist. Plan discussed with patient and daughter. Patient appears reliable for follow up and is agreeable to discharge.   Case has been discussed with and seen by Dr. Johnney Killian who agrees with the above plan to discharge.   Final Clinical Impressions(s) / ED Diagnoses   Final diagnoses:  Nonspecific chest pain    ED Discharge Orders    None       Arville Lime, [place] 10/02/18 1536    Charlesetta Shanks, MD 10/03/18 [phone]

## 2018-10-02 NOTE — ED Provider Notes (Signed)
Medical screening examination/treatment/procedure(s) were conducted as a shared visit with non-physician practitioner(s) and myself.  I personally evaluated the patient during the encounter.  EKG Interpretation  Date/Time:  Sunday October 02 2018 12:45:59 EDT Ventricular Rate:  74 PR Interval:    QRS Duration: 83 QT Interval:  394 QTC Calculation: 438 R Axis:   -14 Text Interpretation:  Sinus rhythm Borderline prolonged PR interval Baseline wander in lead(s) V2 afib from earlier tracing resolved, no ischemic changes Confirmed by Charlesetta Shanks [phone]) on 10/02/2018 1:57:15 PM Patient had chest pain this morning.  She reports he had already ate breakfast.  She felt a sensation that started in her mid abdomen and worked its way all the way up into her chest and the base of her throat.  It lasted for several hours.  It has resolved now.  Patient reports she is had some problems with pain in the left leg for years.  Reports she falls often since her husband died.  Does not have any localizing pain.  She reports it hurts in the morning when she gets up.  Patient is alert and appropriate.  Moves her coordinated purposeful symmetric.  She had atrial fibrillation earlier which is now converted.  Her heart rate is regular.'s are clear.  Bilateral lower extremities are symmetric.  There is no peripheral edema.  The calves are soft and nontender.  Dorsalis pedis pulses are 2+ and symmetric.  Feet are warm and dry.  Patient indicates the area she has pain is over the top of her knee.  She has had surgical replacement.  There is no erythema or effusion.  Patient's EKG does not show ischemic changes.  She does have paroxysmal A. fib and is anticoagulated.  Troponins are negative.  Chest pain is atypical in quality and duration for ischemia.  Not suspect DVT of the lower leg.  I agree with plan of management.   Charlesetta Shanks, MD 10/02/18 1424

## 2018-10-02 NOTE — ED Notes (Signed)
Guadelupe Sabin daughter -[phone]

## 2018-10-02 NOTE — ED Triage Notes (Signed)
Pt began having CP around 8-9am after having a normal morning routine of getting out of bed to get ready & after eating breakfast. Pt felt a sharp pain in the center of her chest that took her breath away, while waiting for EMS the [place] that she lives in treated her with Asprin & EMS gave her 0.4 Nitro while in route to the ED. Arrived to ED with no c/o CP currently & A-fib showing on the monitor.

## 2018-10-02 NOTE — ED Notes (Addendum)
Daughter: Guadelupe Sabin [phone].

## 2018-10-02 NOTE — ED Notes (Signed)
Daughter here requesting to come back due to mom's age .

## 2018-10-03 DIAGNOSIS — M6281 Muscle weakness (generalized): Secondary | ICD-10-CM | POA: Diagnosis not present

## 2018-10-03 DIAGNOSIS — R488 Other symbolic dysfunctions: Secondary | ICD-10-CM | POA: Diagnosis not present

## 2018-10-03 DIAGNOSIS — R278 Other lack of coordination: Secondary | ICD-10-CM | POA: Diagnosis not present

## 2018-10-04 LAB — URINE CULTURE: Culture: <10000 — AB

## 2018-10-05 DIAGNOSIS — R488 Other symbolic dysfunctions: Secondary | ICD-10-CM | POA: Diagnosis not present

## 2018-10-05 DIAGNOSIS — R278 Other lack of coordination: Secondary | ICD-10-CM | POA: Diagnosis not present

## 2018-10-05 DIAGNOSIS — M6281 Muscle weakness (generalized): Secondary | ICD-10-CM | POA: Diagnosis not present

## 2018-10-06 DIAGNOSIS — R278 Other lack of coordination: Secondary | ICD-10-CM | POA: Diagnosis not present

## 2018-10-06 DIAGNOSIS — R488 Other symbolic dysfunctions: Secondary | ICD-10-CM | POA: Diagnosis not present

## 2018-10-06 DIAGNOSIS — M6281 Muscle weakness (generalized): Secondary | ICD-10-CM | POA: Diagnosis not present

## 2018-10-09 DIAGNOSIS — M6281 Muscle weakness (generalized): Secondary | ICD-10-CM | POA: Diagnosis not present

## 2018-10-09 DIAGNOSIS — R278 Other lack of coordination: Secondary | ICD-10-CM | POA: Diagnosis not present

## 2018-10-09 DIAGNOSIS — R488 Other symbolic dysfunctions: Secondary | ICD-10-CM | POA: Diagnosis not present

## 2018-10-17 ENCOUNTER — Emergency Department (HOSPITAL_COMMUNITY): Payer: Medicare HMO

## 2018-10-17 ENCOUNTER — Other Ambulatory Visit: Payer: Self-pay

## 2018-10-17 ENCOUNTER — Observation Stay (HOSPITAL_COMMUNITY)
Admission: EM | Admit: 2018-10-17 | Discharge: 2018-10-19 | Disposition: A | Discharge status: Home / Self Care | Payer: Medicare HMO | Attending: Internal Medicine | Admitting: Internal Medicine

## 2018-10-17 DIAGNOSIS — R4182 Altered mental status, unspecified: Secondary | ICD-10-CM | POA: Diagnosis not present

## 2018-10-17 DIAGNOSIS — K8020 Calculus of gallbladder without cholecystitis without obstruction: Secondary | ICD-10-CM | POA: Diagnosis not present

## 2018-10-17 DIAGNOSIS — Z7901 Long term (current) use of anticoagulants: Secondary | ICD-10-CM | POA: Diagnosis not present

## 2018-10-17 DIAGNOSIS — W1830XA Fall on same level, unspecified, initial encounter: Secondary | ICD-10-CM | POA: Insufficient documentation

## 2018-10-17 DIAGNOSIS — S299XXA Unspecified injury of thorax, initial encounter: Secondary | ICD-10-CM | POA: Diagnosis not present

## 2018-10-17 DIAGNOSIS — Z471 Aftercare following joint replacement surgery: Secondary | ICD-10-CM | POA: Diagnosis not present

## 2018-10-17 DIAGNOSIS — Z96652 Presence of left artificial knee joint: Secondary | ICD-10-CM | POA: Diagnosis not present

## 2018-10-17 DIAGNOSIS — G9340 Encephalopathy, unspecified: Secondary | ICD-10-CM | POA: Insufficient documentation

## 2018-10-17 DIAGNOSIS — K449 Diaphragmatic hernia without obstruction or gangrene: Secondary | ICD-10-CM | POA: Diagnosis not present

## 2018-10-17 DIAGNOSIS — R52 Pain, unspecified: Secondary | ICD-10-CM | POA: Diagnosis not present

## 2018-10-17 DIAGNOSIS — Y999 Unspecified external cause status: Secondary | ICD-10-CM | POA: Insufficient documentation

## 2018-10-17 DIAGNOSIS — Z9013 Acquired absence of bilateral breasts and nipples: Secondary | ICD-10-CM | POA: Diagnosis not present

## 2018-10-17 DIAGNOSIS — I10 Essential (primary) hypertension: Secondary | ICD-10-CM | POA: Diagnosis not present

## 2018-10-17 DIAGNOSIS — W19XXXA Unspecified fall, initial encounter: Secondary | ICD-10-CM

## 2018-10-17 DIAGNOSIS — Z853 Personal history of malignant neoplasm of breast: Secondary | ICD-10-CM | POA: Diagnosis not present

## 2018-10-17 DIAGNOSIS — R404 Transient alteration of awareness: Secondary | ICD-10-CM | POA: Diagnosis not present

## 2018-10-17 DIAGNOSIS — S7001XA Contusion of right hip, initial encounter: Secondary | ICD-10-CM | POA: Diagnosis not present

## 2018-10-17 DIAGNOSIS — Y939 Activity, unspecified: Secondary | ICD-10-CM | POA: Diagnosis not present

## 2018-10-17 DIAGNOSIS — S4991XA Unspecified injury of right shoulder and upper arm, initial encounter: Secondary | ICD-10-CM | POA: Diagnosis not present

## 2018-10-17 DIAGNOSIS — M25562 Pain in left knee: Secondary | ICD-10-CM | POA: Insufficient documentation

## 2018-10-17 DIAGNOSIS — Z03818 Encounter for observation for suspected exposure to other biological agents ruled out: Secondary | ICD-10-CM | POA: Insufficient documentation

## 2018-10-17 DIAGNOSIS — S40011A Contusion of right shoulder, initial encounter: Principal | ICD-10-CM | POA: Insufficient documentation

## 2018-10-17 DIAGNOSIS — R0902 Hypoxemia: Secondary | ICD-10-CM | POA: Diagnosis not present

## 2018-10-17 DIAGNOSIS — Z79899 Other long term (current) drug therapy: Secondary | ICD-10-CM | POA: Insufficient documentation

## 2018-10-17 DIAGNOSIS — Y92122 Bedroom in nursing home as the place of occurrence of the external cause: Secondary | ICD-10-CM | POA: Diagnosis not present

## 2018-10-17 DIAGNOSIS — Z96653 Presence of artificial knee joint, bilateral: Secondary | ICD-10-CM | POA: Diagnosis not present

## 2018-10-17 DIAGNOSIS — R410 Disorientation, unspecified: Secondary | ICD-10-CM | POA: Diagnosis not present

## 2018-10-17 DIAGNOSIS — I482 Chronic atrial fibrillation, unspecified: Secondary | ICD-10-CM | POA: Diagnosis not present

## 2018-10-17 DIAGNOSIS — Z87891 Personal history of nicotine dependence: Secondary | ICD-10-CM | POA: Insufficient documentation

## 2018-10-17 DIAGNOSIS — R42 Dizziness and giddiness: Secondary | ICD-10-CM | POA: Diagnosis not present

## 2018-10-17 LAB — CBG MONITORING, ED: Glucose-Capillary: 156 mg/dL — ABNORMAL HIGH (ref 70–99)

## 2018-10-17 LAB — BASIC METABOLIC PANEL
Anion gap: 13 (ref 5–15)
Anion gap: 14 (ref 5–15)
BUN: 17 mg/dL (ref 8–23)
BUN: 17 mg/dL (ref 8–23)
CO2: 24 mmol/L (ref 22–32)
CO2: 26 mmol/L (ref 22–32)
Calcium: 9.3 mg/dL (ref 8.9–10.3)
Calcium: 9.3 mg/dL (ref 8.9–10.3)
Chloride: 101 mmol/L (ref 98–111)
Chloride: 102 mmol/L (ref 98–111)
Creatinine, Ser: 0.83 mg/dL (ref 0.44–1.00)
Creatinine, Ser: 0.78 mg/dL (ref 0.44–1.00)
GFR calc Af Amer: >60 mL/min (ref >60)
GFR calc Af Amer: >60 mL/min (ref >60)
GFR calc non Af Amer: >60 mL/min (ref >60)
GFR calc non Af Amer: >60 mL/min (ref >60)
Glucose, Bld: 156 mg/dL — ABNORMAL HIGH (ref 70–99)
Glucose, Bld: 161 mg/dL — ABNORMAL HIGH (ref 70–99)
Potassium: 6 mmol/L — ABNORMAL HIGH (ref 3.5–5.1)
Potassium: 3.5 mmol/L (ref 3.5–5.1)
Sodium: 138 mmol/L (ref 135–145)
Sodium: 142 mmol/L (ref 135–145)

## 2018-10-17 LAB — CBC WITH DIFFERENTIAL/PLATELET
Abs Immature Granulocytes: 0.06 10*3/uL (ref 0.00–0.07)
Basophils Absolute: 0 10*3/uL (ref 0.0–0.1)
Basophils Relative: 0 %
Eosinophils Absolute: 0 10*3/uL (ref 0.0–0.5)
Eosinophils Relative: 0 %
HCT: 49.1 % — ABNORMAL HIGH (ref 36.0–46.0)
Hemoglobin: 16.4 g/dL — ABNORMAL HIGH (ref 12.0–15.0)
Immature Granulocytes: 0 %
Lymphocytes Relative: 10 %
Lymphs Abs: 1.5 10*3/uL (ref 0.7–4.0)
MCH: 31.2 pg (ref 26.0–34.0)
MCHC: 33.4 g/dL (ref 30.0–36.0)
MCV: 93.3 fL (ref 80.0–100.0)
Monocytes Absolute: 1.1 10*3/uL — ABNORMAL HIGH (ref 0.1–1.0)
Monocytes Relative: 7 %
Neutro Abs: 12.5 10*3/uL — ABNORMAL HIGH (ref 1.7–7.7)
Neutrophils Relative %: 83 %
Platelets: 261 10*3/uL (ref 150–400)
RBC: 5.26 MIL/uL — ABNORMAL HIGH (ref 3.87–5.11)
RDW: 12.8 % (ref 11.5–15.5)
WBC: 15.1 10*3/uL — ABNORMAL HIGH (ref 4.0–10.5)
nRBC: 0 % (ref 0.0–0.2)

## 2018-10-17 LAB — HEPATIC FUNCTION PANEL
ALT: 35 U/L (ref 0–44)
AST: 112 U/L — ABNORMAL HIGH (ref 15–41)
Albumin: 3.9 g/dL (ref 3.5–5.0)
Alkaline Phosphatase: 60 U/L (ref 38–126)
Bilirubin, Direct: 1.1 mg/dL — ABNORMAL HIGH (ref 0.0–0.2)
Indirect Bilirubin: 1.8 mg/dL — ABNORMAL HIGH (ref 0.3–0.9)
Total Bilirubin: 2.9 mg/dL — ABNORMAL HIGH (ref 0.3–1.2)
Total Protein: 7.2 g/dL (ref 6.5–8.1)

## 2018-10-17 LAB — URINALYSIS, ROUTINE W REFLEX MICROSCOPIC
Bacteria, UA: NONE SEEN
Bilirubin Urine: NEGATIVE
Glucose, UA: NEGATIVE mg/dL
Hgb urine dipstick: NEGATIVE
Ketones, ur: 20 mg/dL — AB
Leukocytes,Ua: NEGATIVE
Nitrite: NEGATIVE
Protein, ur: 100 mg/dL — AB
Specific Gravity, Urine: 1.023 (ref 1.005–1.030)
pH: 5 (ref 5.0–8.0)

## 2018-10-17 MED ORDER — SODIUM CHLORIDE 0.9 % IV BOLUS
1000.0000 mL | Freq: Once | INTRAVENOUS | Status: AC
  Administered 2018-10-18: 1000 mL via INTRAVENOUS

## 2018-10-17 NOTE — ED Triage Notes (Signed)
Pt in be EMS after fall.  Unknown if this AM or earlier.  Pt found down in her room, still her pj's and bed not made.  Staff also stated she did not come for lunch.  Hx of mild confusion/ forgetfulness.  Pt doesn't remember her fall.  Bruising noted to R hip and shoulder with pain noted.  Pt alert to self and day only.  Pleasant without noted distress.

## 2018-10-17 NOTE — ED Provider Notes (Signed)
[facility]PART[facility] Provider Note   CSN: [id number] Arrival date & time: 10/17/18  1850    History   Chief Complaint No chief complaint on file.   HPI [name] is a 85 y.o. female.     The history is provided by the patient, the EMS personnel and a relative.  Illness Location:  Generalized Quality:  Unwitnessed fall, AMS Severity:  Moderate Onset quality:  Unable to specify Timing:  Constant Progression:  Unchanged Chronicity:  New    Patient is a 85 year old female who presents by EMS after an unwitnessed fall.  Patient was found down in her room at facility and was still in her pajamas with the bed not made. EMS says staff states she also did not show up for lunch.   I spoke with the patient's daughter [name]) at around 2000 hours.  She states that she went to check on her mom at the facility this evening and found her mother on the floor of the room when she walked in, the patient's bed was not made and the patient was still in her pajamas. Her daughter denies any known dementia diagnosis, though does confirm the patient suffers from "poor short term memory." Her daughter says her mother was altered and confused when she found her.   The patient is unable to provide any additional pertinent history, as she is quite confused on mental status and reality testing.   Past Medical History:  Diagnosis Date   Arthritis    low spine   Breast cancer ([place]) 2013   right breast   Breast cancer, right breast, recurrent 06/03/2011   Cancer ([place]) 1989   breast right   Chronic back pain    Hypertension     Patient Active Problem List   Diagnosis Date Noted   Atrial fibrillation, chronic 10/18/2018   Gallstones 10/18/2018   Acute encephalopathy 10/17/2018   Atrial fibrillation, new onset ([place]) 09/13/2016   Chronic anticoagulation 09/13/2016   Essential hypertension 09/13/2016   Chest pain with moderate risk of acute coronary  syndrome 09/12/2016   Breast cancer, right breast, recurrent 06/03/2011   Personal history of malignant neoplasm of breast 04/14/1987    Past Surgical History:  Procedure Laterality Date   [place]   rt lumpectomy-axillary dissection   COLONOSCOPY     EYE SURGERY     both cataracts   JOINT REPLACEMENT  2004, 2005   both knees    MASTECTOMY Right 06/11/11   right breast   SHOULDER SURGERY     patient does not remember date of procedure     OB History   No obstetric history on file.      Home Medications    Prior to Admission medications   Medication Sig Start Date End Date Taking? Authorizing Provider  acetaminophen (TYLENOL) 500 MG tablet Take 500-1,000 mg by mouth every 8 (eight) hours as needed (for arthritis pain).   Yes [provider]  atorvastatin (LIPITOR) 20 MG tablet TAKE 1 TABLET BY MOUTH EVERY DAY Patient taking differently: Take 20 mg by mouth daily.  09/21/17  Yes Croitoru, Mihai, MD  calcium carbonate (OS-CAL) 600 MG TABS Take 600 mg by mouth daily.   Yes [provider]  Cholecalciferol (VITAMIN D3) 1000 UNITS CAPS Take 1,000 Units by mouth daily.    Yes [provider]  diltiazem (CARDIZEM CD) 120 MG 24 hr capsule TAKE 1  CAPSULE (120 MG TOTAL) BY MOUTH DAILY. PLEASE MAKE OVERDUE APPT Patient taking differently: Take 120 mg by mouth daily.  09/26/18  Yes Croitoru, Mihai, MD  ELIQUIS 5 MG TABS tablet TAKE 1 TABLET BY MOUTH TWICE A DAY Patient taking differently: Take 5 mg by mouth 2 (two) times daily.  07/14/18  Yes Croitoru, Mihai, MD  gabapentin (NEURONTIN) 100 MG capsule Take 100 mg by mouth at bedtime.  04/03/11  Yes [provider]  HYDROcodone-acetaminophen (NORCO/VICODIN) 5-325 MG tablet Take 1 tablet by mouth every 8 (eight) hours as needed for pain. 01/25/18  Yes [provider]  losartan (COZAAR) 25 MG tablet TAKE 2 TABLETS BY MOUTH (50MG  TOTAL) EVERY DAY PT  NEEDS OFFICE VISIT Patient taking differently: Take 25 mg by mouth every morning.  06/07/18  Yes Croitoru, Mihai, MD  Multiple Vitamins-Minerals (CENTRUM SILVER 50+WOMEN PO) Take 1 tablet by mouth daily with breakfast.   Yes [provider]  nitroGLYCERIN (NITROSTAT) 0.4 MG SL tablet Place 1 tablet (0.4 mg total) under the tongue every 5 (five) minutes x 3 doses as needed for chest pain. 09/13/16  Yes Kilroy, Luke K, PA-C  predniSONE (DELTASONE) 2.5 MG tablet Take 2.5 mg by mouth daily. 12/27/17  Yes [provider]  RABEprazole (ACIPHEX) 20 MG tablet TAKE 1 TABLET BY MOUTH EVERY DAY Patient taking differently: Take 20 mg by mouth at bedtime.  09/21/17  Yes Croitoru, Mihai, MD  sertraline (ZOLOFT) 50 MG tablet Take 50 mg by mouth every morning.  02/04/18  Yes [provider]  vitamin C (ASCORBIC ACID) 500 MG tablet Take 500 mg by mouth daily.   Yes [provider]  VITAMIN E PO Take 1,000 Units by mouth daily.    Yes [provider]    Family History Family History  Problem Relation Age of Onset   Heart disease Mother    Breast cancer Maternal Aunt    Cancer Brother        unaware   Heart disease Brother     Social History Social History   Tobacco Use   Smoking status: Former Smoker    Quit date: 05/11/1970    Years since quitting: 48.4   Smokeless tobacco: Never Used  Substance Use Topics   Alcohol use: No   Drug use: No     Allergies   Arimidex [anastrozole], Fish allergy, Morphine and related, Shellfish allergy, and Vicodin [hydrocodone-acetaminophen]   Review of Systems Review of Systems  Unable to perform ROS: Mental status change     Physical Exam Updated Vital Signs BP (!) 154/45 (BP Location: Left Arm)    Pulse 77    Temp 98.5 F (36.9 C) (Oral)    Resp 18    Wt 76.9 kg    SpO2 95%    BMI 28.21 kg/m   Physical Exam Vitals signs and nursing note reviewed.  Constitutional:      General: She is not in acute  distress.    Appearance: She is well-developed.  HENT:     Head: Normocephalic and atraumatic.     Right Ear: External ear normal.     Left Ear: External ear normal.     Nose: Nose normal.  Eyes:     Conjunctiva/sclera: Conjunctivae normal.  Neck:     Musculoskeletal: Neck supple. Muscular tenderness present.  Cardiovascular:     Rate and Rhythm: Normal rate and regular rhythm.     Heart sounds: No murmur.  Pulmonary:  Effort: Pulmonary effort is normal. No respiratory distress.     Breath sounds: Normal breath sounds.  Abdominal:     Palpations: Abdomen is soft.     Tenderness: There is no abdominal tenderness.  Musculoskeletal:     Right shoulder: She exhibits tenderness and swelling. She exhibits no crepitus.       Arms:  Skin:    General: Skin is warm and dry.  Neurological:     Mental Status: She is alert.      ED Treatments / Results  Labs (all labs ordered are listed, but only abnormal results are displayed) Labs Reviewed  CBC WITH DIFFERENTIAL/PLATELET - Abnormal; Notable for the following components:      Result Value   WBC 15.1 (*)    RBC 5.26 (*)    Hemoglobin 16.4 (*)    HCT 49.1 (*)    Neutro Abs 12.5 (*)    Monocytes Absolute 1.1 (*)    All other components within normal limits  BASIC METABOLIC PANEL - Abnormal; Notable for the following components:   Potassium 6.0 (*)    Glucose, Bld 156 (*)    All other components within normal limits  URINALYSIS, ROUTINE W REFLEX MICROSCOPIC - Abnormal; Notable for the following components:   Ketones, ur 20 (*)    Protein, ur 100 (*)    All other components within normal limits  HEPATIC FUNCTION PANEL - Abnormal; Notable for the following components:   AST 112 (*)    Total Bilirubin 2.9 (*)    Bilirubin, Direct 1.1 (*)    Indirect Bilirubin 1.8 (*)    All other components within normal limits  BASIC METABOLIC PANEL - Abnormal; Notable for the following components:   Glucose, Bld 161 (*)    All other  components within normal limits  CK - Abnormal; Notable for the following components:   Total CK 1,449 (*)    All other components within normal limits  HEPARIN LEVEL (UNFRACTIONATED) - Abnormal; Notable for the following components:   Heparin Unfractionated 0.28 (*)    All other components within normal limits  HEPATIC FUNCTION PANEL - Abnormal; Notable for the following components:   AST 56 (*)    Total Bilirubin 1.3 (*)    Indirect Bilirubin 1.1 (*)    All other components within normal limits  CBC WITH DIFFERENTIAL/PLATELET - Abnormal; Notable for the following components:   WBC 15.8 (*)    Neutro Abs 13.6 (*)    Monocytes Absolute 1.2 (*)    All other components within normal limits  BASIC METABOLIC PANEL - Abnormal; Notable for the following components:   Potassium 3.3 (*)    Glucose, Bld 204 (*)    All other components within normal limits  CK - Abnormal; Notable for the following components:   Total CK 1,114 (*)    All other components within normal limits  TROPONIN I (HIGH SENSITIVITY) - Abnormal; Notable for the following components:   Troponin I (High Sensitivity) 19 (*)    All other components within normal limits  TROPONIN I (HIGH SENSITIVITY) - Abnormal; Notable for the following components:   Troponin I (High Sensitivity) 22 (*)    All other components within normal limits  APTT - Abnormal; Notable for the following components:   aPTT 45 (*)    All other components within normal limits  CBG MONITORING, ED - Abnormal; Notable for the following components:   Glucose-Capillary 156 (*)    All other components within normal  limits  SARS CORONAVIRUS 2 (HOSPITAL ORDER, [facility])  APTT  PROTIME-INR  AMMONIA  MAGNESIUM  HEPARIN LEVEL (UNFRACTIONATED)  PROCALCITONIN  VITAMIN B12  TSH  FOLATE RBC  HEPARIN LEVEL (UNFRACTIONATED)  CBC  COMPREHENSIVE METABOLIC PANEL  MAGNESIUM  APTT    EKG None  Radiology Dg Chest 2 View  Result  Date: 10/17/2018 CLINICAL DATA:  85 year old with right shoulder bruising. Fall. Confusion. EXAM: CHEST - 2 VIEW COMPARISON:  Radiograph 10/02/2018 FINDINGS: The cardiomediastinal contours are unchanged. Moderate retrocardiac hiatal hernia with air-fluid level. Pulmonary vasculature is normal. No consolidation, pleural effusion, or pneumothorax. No acute osseous abnormalities are seen. Surgical clips in the right axilla. IMPRESSION: 1. No acute chest findings. 2. Moderate hiatal hernia. Electronically Signed   By: Keith Rake M.D.   On: 10/17/2018 22:17   Dg Pelvis 1-2 Views  Result Date: 10/17/2018 CLINICAL DATA:  85 year old with right hip bruising. Fall. Confusion. EXAM: PELVIS - 1-2 VIEW COMPARISON:  None. FINDINGS: The cortical margins of the bony pelvis are intact. No fracture. Pubic symphysis and sacroiliac joints are congruent. Both femoral heads are well-seated in the respective acetabula. Degenerative change of the pubic symphysis. Stool ball distends the rectum. IMPRESSION: No pelvic fracture. Electronically Signed   By: Keith Rake M.D.   On: 10/17/2018 22:18   Dg Shoulder Right  Result Date: 10/17/2018 CLINICAL DATA:  85 year old with right shoulder bruising. Fall. Confusion. EXAM: RIGHT SHOULDER - 2+ VIEW COMPARISON:  None. FINDINGS: There is no evidence of fracture or dislocation. There is no evidence of arthropathy or other focal bone abnormality. Mild soft tissue prominence in the supraclavicular region may be soft tissue overlap 4 hematoma/bruising. IMPRESSION: No fracture or dislocation of the right shoulder. Electronically Signed   By: Keith Rake M.D.   On: 10/17/2018 22:16   Dg Knee 2 Views Left  Result Date: 10/17/2018 CLINICAL DATA:  85 year old with left knee pain.  Fall. Confusion. EXAM: LEFT KNEE - 1-2 VIEW COMPARISON:  None. FINDINGS: Left knee arthroplasty in expected alignment. No periprosthetic lucency or fracture. There is been patellar resurfacing. No  significant joint effusion. Vascular calcifications. IMPRESSION: Left knee arthroplasty without complication or acute fracture. Electronically Signed   By: Keith Rake M.D.   On: 10/17/2018 22:17   Ct Head Wo Contrast  Result Date: 10/17/2018 CLINICAL DATA:  Found down, fall, confusion EXAM: CT HEAD WITHOUT CONTRAST CT CERVICAL SPINE WITHOUT CONTRAST TECHNIQUE: Multidetector CT imaging of the head and cervical spine was performed following the standard protocol without intravenous contrast. Multiplanar CT image reconstructions of the cervical spine were also generated. COMPARISON:  None. FINDINGS: CT HEAD FINDINGS Brain: No evidence of acute infarction, hemorrhage, hydrocephalus, extra-axial collection or mass lesion/mass effect. Periventricular white matter hypodensity. Vascular: No hyperdense vessel or unexpected calcification. Skull: Normal. Negative for fracture or focal lesion. Sinuses/Orbits: No acute finding. Other: None. CT CERVICAL SPINE FINDINGS Alignment: Normal. Skull base and vertebrae: No acute fracture. No primary bone lesion or focal pathologic process. Soft tissues and spinal canal: No prevertebral fluid or swelling. No visible canal hematoma. Disc levels: Moderate multilevel disc space height loss and osteophytosis. Upper chest: Negative. Other: None. IMPRESSION: 1. No acute intracranial pathology. Small-vessel white matter disease. 2. No fracture or static subluxation of the cervical spine. Moderate multilevel disc degenerative disease and osteophytosis. Electronically Signed   By: Eddie Candle M.D.   On: 10/17/2018 21:39   Ct Cervical Spine Wo Contrast  Result Date: 10/17/2018 CLINICAL DATA:  Found down, fall, confusion EXAM: CT HEAD WITHOUT CONTRAST CT CERVICAL SPINE WITHOUT CONTRAST TECHNIQUE: Multidetector CT imaging of the head and cervical spine was performed following the standard protocol without intravenous contrast. Multiplanar CT image reconstructions of the cervical spine  were also generated. COMPARISON:  None. FINDINGS: CT HEAD FINDINGS Brain: No evidence of acute infarction, hemorrhage, hydrocephalus, extra-axial collection or mass lesion/mass effect. Periventricular white matter hypodensity. Vascular: No hyperdense vessel or unexpected calcification. Skull: Normal. Negative for fracture or focal lesion. Sinuses/Orbits: No acute finding. Other: None. CT CERVICAL SPINE FINDINGS Alignment: Normal. Skull base and vertebrae: No acute fracture. No primary bone lesion or focal pathologic process. Soft tissues and spinal canal: No prevertebral fluid or swelling. No visible canal hematoma. Disc levels: Moderate multilevel disc space height loss and osteophytosis. Upper chest: Negative. Other: None. IMPRESSION: 1. No acute intracranial pathology. Small-vessel white matter disease. 2. No fracture or static subluxation of the cervical spine. Moderate multilevel disc degenerative disease and osteophytosis. Electronically Signed   By: Eddie Candle M.D.   On: 10/17/2018 21:39   Mr Brain Wo Contrast  Result Date: 10/18/2018 CLINICAL DATA:  Altered level of consciousness, unexplained EXAM: MRI HEAD WITHOUT CONTRAST TECHNIQUE: Multiplanar, multiecho pulse sequences of the brain and surrounding structures were obtained without intravenous contrast. COMPARISON:  Head CT from yesterday FINDINGS: Brain: No acute infarction, hemorrhage, hydrocephalus, extra-axial collection or mass lesion. Moderate chronic small vessel ischemia in this cerebral white matter. Mild to moderate cerebral volume loss without specific pattern. Vascular: Major flow voids are preserved Skull and upper cervical spine: Negative for marrow lesion. Cervical spine degeneration as seen by CT yesterday. Sinuses/Orbits: Negative Other: Left more than right mastoid opacification with negative nasopharynx. On the more extensive left this is chronic based on sclerosis in 2012 comparison IMPRESSION: Senescent changes without acute or  reversible finding. Electronically Signed   By: Monte Fantasia M.D.   On: 10/18/2018 12:16   Nm Hepato W/eject Fract  Result Date: 10/18/2018 CLINICAL DATA:  Abdominal tenderness and elevated liver enzymes EXAM: NUCLEAR MEDICINE HEPATOBILIARY IMAGING WITH GALLBLADDER EF VIEWS: Anterior right upper quadrant RADIOPHARMACEUTICALS:  4.7 mCi Tc-66m  Choletec IV COMPARISON:  None. FINDINGS: Liver uptake of radiotracer is unremarkable. There is prompt visualization of gallbladder and small bowel, indicating patency of the cystic and common bile ducts. The patient consumed 8 ounces of Ensure Enlive with calculation of the computer generated ejection fraction of radiotracer from the gallbladder. The patient did not experience clinical symptoms with the oral Ensure consumption. The computer generated ejection fraction of radiotracer from the gallbladder is normal at 89%, normal greater than 33% using the oral agent. IMPRESSION: Study within normal limits. Electronically Signed   By: Lowella Grip III M.D.   On: 10/18/2018 11:06   US Abdomen Limited  Result Date: 10/17/2018 CLINICAL DATA:  Right upper quadrant pain for 1 day, elevated LFTs EXAM: ULTRASOUND ABDOMEN LIMITED RIGHT UPPER QUADRANT COMPARISON:  None. FINDINGS: Gallbladder: Gallbladder is well distended with evidence of cholelithiasis. Negative sonographic Murphy's sign is noted. Common bile duct: Diameter: 5 mm. Liver: No focal lesion identified. Within normal limits in parenchymal echogenicity. Portal vein is patent on color Doppler imaging with normal direction of blood flow towards the liver. IMPRESSION: Cholelithiasis without complicating factors. Electronically Signed   By: Inez Catalina M.D.   On: 10/17/2018 23:20   Dg Humerus Right  Result Date: 10/17/2018 CLINICAL DATA:  85 year old with right shoulder bruising. Fall. Confusion. EXAM: RIGHT HUMERUS - 2+ VIEW COMPARISON:  None. FINDINGS: Cortical margins of the humerus are intact. There is no  evidence of fracture or other focal bone lesions. Scattered soft tissue calcifications. Soft tissue fullness of the upper arm may be related to underlying bruising. IMPRESSION: No fracture of the right humerus. Electronically Signed   By: Keith Rake M.D.   On: 10/17/2018 22:19    Procedures Procedures (including critical care time)  Medications Ordered in ED Medications  0.9 %  sodium chloride infusion ( Intravenous New Bag/Given 10/18/18 2047)  losartan (COZAAR) tablet 25 mg (25 mg Oral Given 10/18/18 1435)  sertraline (ZOLOFT) tablet 50 mg (50 mg Oral Given 10/18/18 1436)  pantoprazole (PROTONIX) EC tablet 40 mg (40 mg Oral Given 10/18/18 1436)  calcium carbonate (OS-CAL - dosed in mg of elemental calcium) tablet 1,250 mg (1,250 mg Oral Given 10/18/18 0756)  vitamin C (ASCORBIC ACID) tablet 500 mg (500 mg Oral Given 10/18/18 1435)  ondansetron (ZOFRAN) tablet 4 mg (has no administration in time range)    Or  ondansetron (ZOFRAN) injection 4 mg (has no administration in time range)  gabapentin (NEURONTIN) capsule 100 mg (100 mg Oral Given 10/18/18 2148)  predniSONE (DELTASONE) tablet 2.5 mg (2.5 mg Oral Given 10/18/18 0757)  HYDROcodone-acetaminophen (NORCO/VICODIN) 5-325 MG per tablet 1 tablet (has no administration in time range)  heparin ADULT infusion 100 units/mL (25000 units/237mL sodium chloride 0.45%) (1,200 Units/hr Intravenous Rate/Dose Change 10/18/18 1800)  polyethylene glycol (MIRALAX / GLYCOLAX) packet 17 g (has no administration in time range)  senna-docusate (Senokot-S) tablet 2 tablet (has no administration in time range)  ipratropium-albuterol (DUONEB) 0.5-2.5 (3) MG/3ML nebulizer solution 3 mL (has no administration in time range)  hydrALAZINE (APRESOLINE) injection 10 mg (has no administration in time range)  loratadine (CLARITIN) tablet 10 mg (has no administration in time range)  lip balm (CARMEX) ointment 1 application (has no administration in time range)  polyvinyl alcohol  (LIQUIFILM TEARS) 1.4 % ophthalmic solution 1 drop (has no administration in time range)  hydrocortisone (ANUSOL-HC) 2.5 % rectal cream 1 application (has no administration in time range)  alum & mag hydroxide-simeth (MAALOX/MYLANTA) 200-200-20 MG/5ML suspension 30 mL (has no administration in time range)  hydrocortisone cream 1 % 1 application (has no administration in time range)  Muscle Rub CREA 1 application (has no administration in time range)  sodium chloride (OCEAN) 0.65 % nasal spray 1 spray (has no administration in time range)  phenol (CHLORASEPTIC) mouth spray 1 spray (has no administration in time range)  metoprolol tartrate (LOPRESSOR) injection 5 mg (5 mg Intravenous Given 10/18/18 1317)  diltiazem (CARDIZEM) 100 mg in dextrose 5% 119mL (1 mg/mL) infusion (5 mg/hr Intravenous Not Given 10/18/18 1836)  sodium chloride 0.9 % bolus 1,000 mL (1,000 mLs Intravenous New Bag/Given 10/18/18 0121)  diltiazem (CARDIZEM) injection 10 mg (10 mg Intravenous Given 10/18/18 0603)  diltiazem (CARDIZEM) injection 10 mg (10 mg Intravenous Given 10/18/18 0816)  technetium TC 47M mebrofenin (CHOLETEC) injection 5 millicurie (5 millicuries Intravenous Contrast Given 10/18/18 0839)  LORazepam (ATIVAN) injection 1 mg (1 mg Intravenous Given 10/18/18 1107)  potassium chloride SA (K-DUR) CR tablet 40 mEq (40 mEq Oral Given 10/18/18 1436)     Initial Impression / Assessment and Plan / ED Course  I have reviewed the triage vital signs and the nursing notes.    Medical Decision Making:   [name] is a 85 y.o. female history of atrial fibrillation, HTN, arthritis, who lives at a independent living facilit and was found today by  her daughter on the ground in her room, confused and with some external signs suggestive of her having had a fall.  She arrived afebrile, HDS. Contusions present to right shoulder, right hip, left knee.  IV established labs drawn and sent to lab.   No evidence of acute traumatic head  ijnury, negative CT head and CT cervical spine. X-ray of the right humerus negative X-ray of the pelvis without pelvic fracture X-ray left knee negative Chest x-ray normal X-ray right shoulder normal CT head with no acute intracranial abnormality CT cervical spine with no acute fracture or subluxation abs with acute transaminitis, hyperbiiuminemia. Pt not back at baseline mental status despite IVF hydration and fluids. She will be admitted to the hospitalist service at this time.   Disposition: Admit  The plan for this patient was discussed with my attending physician, Dr. Quintella Reichert, who voiced agreement and who oversaw evaluation and treatment of this patient.    Markale Birdsell A. Jimmye Norman, MD Resident Physician, PGY-3 Emergency Medicine [facility]   Final Clinical Impressions(s) / ED Diagnoses   Final diagnoses:  Fall, initial encounter  Altered mental status, unspecified altered mental status type      Jefm Petty, MD 10/19/18 0388    Quintella Reichert, MD 10/19/18 1241

## 2018-10-17 NOTE — ED Notes (Signed)
IV team called, said to discontinue consult so nobody else comes down to try and get the IV

## 2018-10-17 NOTE — ED Notes (Signed)
Unable to discontinue IV therapy order as it has accidentally been marked "complete." This RN started the IV and made IV team aware.

## 2018-10-17 NOTE — ED Notes (Signed)
Daughter- [name], [phone] would like an update as soon as possible

## 2018-10-17 NOTE — ED Notes (Signed)
Patient transported to Ultrasound 

## 2018-10-17 NOTE — ED Notes (Signed)
Pt returned from radiology at this time.  

## 2018-10-18 ENCOUNTER — Observation Stay (HOSPITAL_COMMUNITY): Payer: Medicare HMO

## 2018-10-18 ENCOUNTER — Encounter (HOSPITAL_COMMUNITY): Payer: Self-pay | Admitting: Internal Medicine

## 2018-10-18 DIAGNOSIS — K8020 Calculus of gallbladder without cholecystitis without obstruction: Secondary | ICD-10-CM | POA: Diagnosis present

## 2018-10-18 DIAGNOSIS — I482 Chronic atrial fibrillation, unspecified: Secondary | ICD-10-CM | POA: Diagnosis present

## 2018-10-18 DIAGNOSIS — S7001XA Contusion of right hip, initial encounter: Secondary | ICD-10-CM | POA: Diagnosis not present

## 2018-10-18 DIAGNOSIS — G9340 Encephalopathy, unspecified: Secondary | ICD-10-CM | POA: Diagnosis not present

## 2018-10-18 DIAGNOSIS — M25562 Pain in left knee: Secondary | ICD-10-CM | POA: Diagnosis not present

## 2018-10-18 DIAGNOSIS — R4020 Unspecified coma: Secondary | ICD-10-CM | POA: Diagnosis not present

## 2018-10-18 DIAGNOSIS — I10 Essential (primary) hypertension: Secondary | ICD-10-CM | POA: Diagnosis not present

## 2018-10-18 DIAGNOSIS — Z853 Personal history of malignant neoplasm of breast: Secondary | ICD-10-CM | POA: Diagnosis not present

## 2018-10-18 DIAGNOSIS — R945 Abnormal results of liver function studies: Secondary | ICD-10-CM | POA: Diagnosis not present

## 2018-10-18 DIAGNOSIS — Z03818 Encounter for observation for suspected exposure to other biological agents ruled out: Secondary | ICD-10-CM | POA: Diagnosis not present

## 2018-10-18 DIAGNOSIS — Z96653 Presence of artificial knee joint, bilateral: Secondary | ICD-10-CM | POA: Diagnosis not present

## 2018-10-18 DIAGNOSIS — S40011A Contusion of right shoulder, initial encounter: Secondary | ICD-10-CM | POA: Diagnosis not present

## 2018-10-18 LAB — HEPATIC FUNCTION PANEL
ALT: 38 U/L (ref 0–44)
AST: 56 U/L — ABNORMAL HIGH (ref 15–41)
Albumin: 3.5 g/dL (ref 3.5–5.0)
Alkaline Phosphatase: 60 U/L (ref 38–126)
Bilirubin, Direct: 0.2 mg/dL (ref 0.0–0.2)
Indirect Bilirubin: 1.1 mg/dL — ABNORMAL HIGH (ref 0.3–0.9)
Total Bilirubin: 1.3 mg/dL — ABNORMAL HIGH (ref 0.3–1.2)
Total Protein: 6.7 g/dL (ref 6.5–8.1)

## 2018-10-18 LAB — BASIC METABOLIC PANEL
Anion gap: 14 (ref 5–15)
BUN: 18 mg/dL (ref 8–23)
CO2: 23 mmol/L (ref 22–32)
Calcium: 9 mg/dL (ref 8.9–10.3)
Chloride: 104 mmol/L (ref 98–111)
Creatinine, Ser: 0.76 mg/dL (ref 0.44–1.00)
GFR calc Af Amer: >60 mL/min (ref >60)
GFR calc non Af Amer: >60 mL/min (ref >60)
Glucose, Bld: 204 mg/dL — ABNORMAL HIGH (ref 70–99)
Potassium: 3.3 mmol/L — ABNORMAL LOW (ref 3.5–5.1)
Sodium: 141 mmol/L (ref 135–145)

## 2018-10-18 LAB — CBC WITH DIFFERENTIAL/PLATELET
Abs Immature Granulocytes: 0.05 10*3/uL (ref 0.00–0.07)
Basophils Absolute: 0 10*3/uL (ref 0.0–0.1)
Basophils Relative: 0 %
Eosinophils Absolute: 0 10*3/uL (ref 0.0–0.5)
Eosinophils Relative: 0 %
HCT: 43.4 % (ref 36.0–46.0)
Hemoglobin: 14.6 g/dL (ref 12.0–15.0)
Immature Granulocytes: 0 %
Lymphocytes Relative: 6 %
Lymphs Abs: 0.9 10*3/uL (ref 0.7–4.0)
MCH: 31.8 pg (ref 26.0–34.0)
MCHC: 33.6 g/dL (ref 30.0–36.0)
MCV: 94.6 fL (ref 80.0–100.0)
Monocytes Absolute: 1.2 10*3/uL — ABNORMAL HIGH (ref 0.1–1.0)
Monocytes Relative: 7 %
Neutro Abs: 13.6 10*3/uL — ABNORMAL HIGH (ref 1.7–7.7)
Neutrophils Relative %: 87 %
Platelets: 254 10*3/uL (ref 150–400)
RBC: 4.59 MIL/uL (ref 3.87–5.11)
RDW: 12.8 % (ref 11.5–15.5)
WBC: 15.8 10*3/uL — ABNORMAL HIGH (ref 4.0–10.5)
nRBC: 0 % (ref 0.0–0.2)

## 2018-10-18 LAB — VITAMIN B12: Vitamin B-12: 207 pg/mL (ref 180–914)

## 2018-10-18 LAB — APTT
aPTT: 24 seconds (ref 24–36)
aPTT: 45 seconds — ABNORMAL HIGH (ref 24–36)

## 2018-10-18 LAB — PROTIME-INR
INR: 1.1 (ref 0.8–1.2)
Prothrombin Time: 14.3 seconds (ref 11.4–15.2)

## 2018-10-18 LAB — MAGNESIUM: Magnesium: 2 mg/dL (ref 1.7–2.4)

## 2018-10-18 LAB — CK
Total CK: 1449 U/L — ABNORMAL HIGH (ref 38–234)
Total CK: 1114 U/L — ABNORMAL HIGH (ref 38–234)

## 2018-10-18 LAB — PROCALCITONIN: Procalcitonin: <0.1 ng/mL

## 2018-10-18 LAB — HEPARIN LEVEL (UNFRACTIONATED)
Heparin Unfractionated: 0.28 IU/mL — ABNORMAL LOW (ref 0.30–0.70)
Heparin Unfractionated: 0.42 IU/mL (ref 0.30–0.70)

## 2018-10-18 LAB — SARS CORONAVIRUS 2 BY RT PCR (HOSPITAL ORDER, PERFORMED IN ~~LOC~~ HOSPITAL LAB): SARS Coronavirus 2: NEGATIVE

## 2018-10-18 LAB — TROPONIN I (HIGH SENSITIVITY)
Troponin I (High Sensitivity): 19 ng/L — ABNORMAL HIGH (ref <18)
Troponin I (High Sensitivity): 22 ng/L — ABNORMAL HIGH (ref <18)

## 2018-10-18 LAB — TSH: TSH: 2.888 u[IU]/mL (ref 0.350–4.500)

## 2018-10-18 LAB — AMMONIA: Ammonia: 25 umol/L (ref 9–35)

## 2018-10-18 MED ORDER — HYDRALAZINE HCL 20 MG/ML IJ SOLN: 10.0000 mg | INTRAMUSCULAR | Status: DC | PRN

## 2018-10-18 MED ORDER — LORATADINE 10 MG PO TABS: 10.0000 mg | ORAL_TABLET | Freq: Every day | ORAL | Status: DC | PRN

## 2018-10-18 MED ORDER — CALCIUM CARBONATE 1250 (500 CA) MG PO TABS
1250.0000 mg | ORAL_TABLET | Freq: Every day | ORAL | Status: DC
  Administered 2018-10-18 – 2018-10-19 (×2): 1250 mg via ORAL
  Filled 2018-10-18 (×2): qty 1

## 2018-10-18 MED ORDER — POTASSIUM CHLORIDE CRYS ER 20 MEQ PO TBCR
40.0000 meq | EXTENDED_RELEASE_TABLET | Freq: Once | ORAL | Status: AC
  Administered 2018-10-18: 40 meq via ORAL
  Filled 2018-10-18: qty 2

## 2018-10-18 MED ORDER — LIP MEDEX EX OINT: 1.0000 "application " | TOPICAL_OINTMENT | CUTANEOUS | Status: DC | PRN

## 2018-10-18 MED ORDER — MUSCLE RUB 10-15 % EX CREA: 1.0000 "application " | TOPICAL_CREAM | CUTANEOUS | Status: DC | PRN

## 2018-10-18 MED ORDER — ALUM & MAG HYDROXIDE-SIMETH 200-200-20 MG/5ML PO SUSP: 30.0000 mL | ORAL | Status: DC | PRN

## 2018-10-18 MED ORDER — LORAZEPAM 2 MG/ML IJ SOLN
1.0000 mg | Freq: Once | INTRAMUSCULAR | Status: AC
  Administered 2018-10-18: 1 mg via INTRAVENOUS
  Filled 2018-10-18: qty 1

## 2018-10-18 MED ORDER — SENNOSIDES-DOCUSATE SODIUM 8.6-50 MG PO TABS: 2.0000 | ORAL_TABLET | Freq: Every evening | ORAL | Status: DC | PRN

## 2018-10-18 MED ORDER — TECHNETIUM TC 99M MEBROFENIN IV KIT
5.0000 | PACK | Freq: Once | INTRAVENOUS | Status: AC | PRN
  Administered 2018-10-18: 5 via INTRAVENOUS

## 2018-10-18 MED ORDER — POLYVINYL ALCOHOL 1.4 % OP SOLN: 1.0000 [drp] | OPHTHALMIC | Status: DC | PRN

## 2018-10-18 MED ORDER — LOSARTAN POTASSIUM 25 MG PO TABS
25.0000 mg | ORAL_TABLET | Freq: Every morning | ORAL | Status: DC
  Administered 2018-10-18 – 2018-10-19 (×2): 25 mg via ORAL
  Filled 2018-10-18 (×2): qty 1

## 2018-10-18 MED ORDER — ATORVASTATIN CALCIUM 10 MG PO TABS: 20.0000 mg | ORAL_TABLET | Freq: Every day | ORAL | Status: DC

## 2018-10-18 MED ORDER — DILTIAZEM HCL-DEXTROSE 100-5 MG/100ML-% IV SOLN (PREMIX)
5.0000 mg/h | INTRAVENOUS | Status: DC
  Filled 2018-10-18: qty 100

## 2018-10-18 MED ORDER — HEPARIN (PORCINE) 25000 UT/250ML-% IV SOLN
1200.0000 [IU]/h | INTRAVENOUS | Status: DC
  Administered 2018-10-18: 1000 [IU]/h via INTRAVENOUS
  Administered 2018-10-19: 1200 [IU]/h via INTRAVENOUS
  Filled 2018-10-18 (×2): qty 250

## 2018-10-18 MED ORDER — ONDANSETRON HCL 4 MG PO TABS: 4.0000 mg | ORAL_TABLET | Freq: Four times a day (QID) | ORAL | Status: DC | PRN

## 2018-10-18 MED ORDER — PREDNISONE 5 MG PO TABS
2.5000 mg | ORAL_TABLET | Freq: Every day | ORAL | Status: DC
  Administered 2018-10-18 – 2018-10-19 (×2): 2.5 mg via ORAL
  Filled 2018-10-18 (×2): qty 1

## 2018-10-18 MED ORDER — HYDROCORTISONE (PERIANAL) 2.5 % EX CREA: 1.0000 "application " | TOPICAL_CREAM | Freq: Four times a day (QID) | CUTANEOUS | Status: DC | PRN

## 2018-10-18 MED ORDER — VITAMIN C 500 MG PO TABS
500.0000 mg | ORAL_TABLET | Freq: Every day | ORAL | Status: DC
  Administered 2018-10-18 – 2018-10-19 (×2): 500 mg via ORAL
  Filled 2018-10-18 (×2): qty 1

## 2018-10-18 MED ORDER — DILTIAZEM HCL 25 MG/5ML IV SOLN
10.0000 mg | Freq: Once | INTRAVENOUS | Status: AC
  Administered 2018-10-18: 10 mg via INTRAVENOUS
  Filled 2018-10-18: qty 5

## 2018-10-18 MED ORDER — SODIUM CHLORIDE 0.9 % IV SOLN
INTRAVENOUS | Status: AC
  Administered 2018-10-18 (×2): via INTRAVENOUS

## 2018-10-18 MED ORDER — METOPROLOL TARTRATE 5 MG/5ML IV SOLN
5.0000 mg | INTRAVENOUS | Status: DC | PRN
  Administered 2018-10-18: 5 mg via INTRAVENOUS
  Filled 2018-10-18: qty 5

## 2018-10-18 MED ORDER — IPRATROPIUM-ALBUTEROL 0.5-2.5 (3) MG/3ML IN SOLN: 3.0000 mL | RESPIRATORY_TRACT | Status: DC | PRN

## 2018-10-18 MED ORDER — GABAPENTIN 100 MG PO CAPS
100.0000 mg | ORAL_CAPSULE | Freq: Every day | ORAL | Status: DC
  Administered 2018-10-18: 100 mg via ORAL
  Filled 2018-10-18: qty 1

## 2018-10-18 MED ORDER — PHENOL 1.4 % MT LIQD: 1.0000 | OROMUCOSAL | Status: DC | PRN

## 2018-10-18 MED ORDER — HYDROCORTISONE 1 % EX CREA: 1.0000 "application " | TOPICAL_CREAM | Freq: Three times a day (TID) | CUTANEOUS | Status: DC | PRN

## 2018-10-18 MED ORDER — POLYETHYLENE GLYCOL 3350 17 G PO PACK: 17.0000 g | PACK | Freq: Every day | ORAL | Status: DC | PRN

## 2018-10-18 MED ORDER — DILTIAZEM HCL ER COATED BEADS 120 MG PO CP24
120.0000 mg | ORAL_CAPSULE | Freq: Every day | ORAL | Status: DC
  Administered 2018-10-18: 120 mg via ORAL
  Filled 2018-10-18: qty 1

## 2018-10-18 MED ORDER — ONDANSETRON HCL 4 MG/2ML IJ SOLN: 4.0000 mg | Freq: Four times a day (QID) | INTRAMUSCULAR | Status: DC | PRN

## 2018-10-18 MED ORDER — SERTRALINE HCL 50 MG PO TABS
50.0000 mg | ORAL_TABLET | Freq: Every morning | ORAL | Status: DC
  Administered 2018-10-18 – 2018-10-19 (×2): 50 mg via ORAL
  Filled 2018-10-18 (×2): qty 1

## 2018-10-18 MED ORDER — HYDROCODONE-ACETAMINOPHEN 5-325 MG PO TABS: 1.0000 | ORAL_TABLET | Freq: Every day | ORAL | Status: DC | PRN

## 2018-10-18 MED ORDER — SALINE SPRAY 0.65 % NA SOLN: 1.0000 | NASAL | Status: DC | PRN

## 2018-10-18 MED ORDER — PANTOPRAZOLE SODIUM 40 MG PO TBEC
40.0000 mg | DELAYED_RELEASE_TABLET | Freq: Every day | ORAL | Status: DC
  Administered 2018-10-18 – 2018-10-19 (×2): 40 mg via ORAL
  Filled 2018-10-18 (×2): qty 1

## 2018-10-18 NOTE — H&P (Signed)
History and Physical    [name] PXT:[id number] DOB: 1925/09/19 DOA: 10/17/2018  PCP: Mayra Neer, MD  Patient coming from: Independent living facility.  History obtained from patient's daughter.  Chief Complaint: Fall.  HPI: [name] is a 85 y.o. female with history of atrial fibrillation, breast cancer in remission, hypertension, chronic back pain, arthritis takes prednisone was brought to the ER after patient was found on the floor by patient's daughter when patient's daughter went to visit the patient.  As per the daughter patient has been more confused than usual last 48 hours and has not taken her medications for almost 48 hours.  When patient's daughter went to check on her she was found on the floor and not sure how long she been on the floor but suspect at least through the day.  Patient was not unconscious.  But appeared confused.  ED Course: Patient was brought to the ER and was oriented to her name moves all extremities but had weakness all over.  No definite focal deficits.  CT head C-spine were unremarkable.  Patient on exam had multiple bruises.  X-ray of the pelvis and multiple other x-rays including chest x-ray right humerus x-ray of the left knee pelvis were unremarkable and monitoring of the acute.  LFT was mildly elevated and since patient was tender probably from bruising sonogram of the abdomen was done which shows gallstones but no definite evidence of cholecystitis.  Patient was started on hydration and admitted for acute encephalopathy with weakness.  CK levels turned out to be elevated at 1449.  Review of Systems: As per HPI, rest all negative.   Past Medical History:  Diagnosis Date   Arthritis    low spine   Breast cancer ([place]) 2013   right breast   Breast cancer, right breast, recurrent 06/03/2011   Cancer ([place]) 1989   breast right   Chronic back pain    Hypertension     Past Surgical History:  Procedure Laterality Date    ABDOMINAL HYSTERECTOMY  1974   BREAST SURGERY  1989   rt lumpectomy-axillary dissection   COLONOSCOPY     EYE SURGERY     both cataracts   JOINT REPLACEMENT  2004, 2005   both knees    MASTECTOMY Right 06/11/11   right breast   SHOULDER SURGERY     patient does not remember date of procedure     reports that she quit smoking about 48 years ago. She has never used smokeless tobacco. She reports that she does not drink alcohol or use drugs.  Allergies  Allergen Reactions   Arimidex [Anastrozole] Other (See Comments)    Neck cramping   Fish Allergy Other (See Comments)    Patient prefers to NOT eat this   Morphine And Related Nausea And Vomiting   Shellfish Allergy Other (See Comments)    Patient prefers to NOT eat this   Vicodin [Hydrocodone-Acetaminophen] Nausea Only    DOES tolerate Vicodin/Norco, however    Family History  Problem Relation Age of Onset   Heart disease Mother    Breast cancer Maternal Aunt    Cancer Brother        unaware   Heart disease Brother     Prior to Admission medications   Medication Sig Start Date End Date Taking? Authorizing Provider  acetaminophen (TYLENOL) 500 MG tablet Take 500-1,000 mg by mouth every 8 (eight) hours as needed (for arthritis pain).   Yes [provider]  atorvastatin (LIPITOR) 20 MG tablet TAKE 1 TABLET BY MOUTH EVERY DAY Patient taking differently: Take 20 mg by mouth daily.  09/21/17  Yes Croitoru, Mihai, MD  calcium carbonate (OS-CAL) 600 MG TABS Take 600 mg by mouth daily.   Yes [provider]  Cholecalciferol (VITAMIN D3) 1000 UNITS CAPS Take 1,000 Units by mouth daily.    Yes [provider]  diltiazem (CARDIZEM CD) 120 MG 24 hr capsule TAKE 1 CAPSULE (120 MG TOTAL) BY MOUTH DAILY. PLEASE MAKE OVERDUE APPT Patient taking differently: Take 120 mg by mouth daily.  09/26/18  Yes Croitoru, Mihai, MD  ELIQUIS 5 MG TABS tablet TAKE 1 TABLET BY MOUTH TWICE A DAY Patient taking  differently: Take 5 mg by mouth 2 (two) times daily.  07/14/18  Yes Croitoru, Mihai, MD  gabapentin (NEURONTIN) 100 MG capsule Take 100 mg by mouth at bedtime.  04/03/11  Yes [provider]  HYDROcodone-acetaminophen (NORCO/VICODIN) 5-325 MG tablet Take 1 tablet by mouth every 8 (eight) hours as needed for pain. 01/25/18  Yes [provider]  losartan (COZAAR) 25 MG tablet TAKE 2 TABLETS BY MOUTH (50MG  TOTAL) EVERY DAY PT NEEDS OFFICE VISIT Patient taking differently: Take 25 mg by mouth every morning.  06/07/18  Yes Croitoru, Mihai, MD  Multiple Vitamins-Minerals (CENTRUM SILVER 50+WOMEN PO) Take 1 tablet by mouth daily with breakfast.   Yes [provider]  nitroGLYCERIN (NITROSTAT) 0.4 MG SL tablet Place 1 tablet (0.4 mg total) under the tongue every 5 (five) minutes x 3 doses as needed for chest pain. 09/13/16  Yes Kilroy, Luke K, PA-C  predniSONE (DELTASONE) 2.5 MG tablet Take 2.5 mg by mouth daily. 12/27/17  Yes [provider]  RABEprazole (ACIPHEX) 20 MG tablet TAKE 1 TABLET BY MOUTH EVERY DAY Patient taking differently: Take 20 mg by mouth at bedtime.  09/21/17  Yes Croitoru, Mihai, MD  sertraline (ZOLOFT) 50 MG tablet Take 50 mg by mouth every morning.  02/04/18  Yes [provider]  vitamin C (ASCORBIC ACID) 500 MG tablet Take 500 mg by mouth daily.   Yes [provider]  VITAMIN E PO Take 1,000 Units by mouth daily.    Yes [provider]    Physical Exam: Constitutional: Moderately built and nourished. Vitals:   10/17/18 2345 10/18/18 0000 10/18/18 0015 10/18/18 0030  BP: (!) 181/85 (!) 175/77 (!) 181/99 (!) 143/56  Pulse:      Resp: 20 (!) 23 20 17   SpO2:       Eyes: Anicteric no pallor. ENMT: No discharge from the ears eyes nose or mouth. Neck: No mass felt.  No neck rigidity. Respiratory: No rhonchi or crepitations. Cardiovascular: S1-S2 heard. Abdomen: Diffuse tenderness gently no rebound tenderness bowel sounds  present. Musculoskeletal: Has pain all over..  Ecchymotic areas. Skin: Bruises all over. Neurologic: Alert awake oriented to her name.  Moves all extremities but generally weak. Psychiatric: Oriented to name.   Labs on Admission: I have personally reviewed following labs and imaging studies  CBC: Recent Labs  Lab 10/17/18 2100  WBC 15.1*  NEUTROABS 12.5*  HGB 16.4*  HCT 49.1*  MCV 93.3  PLT 193   Basic Metabolic Panel: Recent Labs  Lab 10/17/18 2100 10/17/18 2250  NA 138 142  K 6.0* 3.5  CL 101 102  CO2 24 26  GLUCOSE 156* 161*  BUN 17 17  CREATININE 0.83 0.78  CALCIUM 9.3 9.3   GFR: CrCl cannot be calculated (Unknown ideal weight.). Liver  Function Tests: Recent Labs  Lab 10/17/18 2100  AST 112*  ALT 35  ALKPHOS 60  BILITOT 2.9*  PROT 7.2  ALBUMIN 3.9   No results for input(s): LIPASE, AMYLASE in the last 168 hours. No results for input(s): AMMONIA in the last 168 hours. Coagulation Profile: No results for input(s): INR, PROTIME in the last 168 hours. Cardiac Enzymes: No results for input(s): CKTOTAL, CKMB, CKMBINDEX, TROPONINI in the last 168 hours. BNP (last 3 results) No results for input(s): PROBNP in the last 8760 hours. HbA1C: No results for input(s): HGBA1C in the last 72 hours. CBG: Recent Labs  Lab 10/17/18 2037  GLUCAP 156*   Lipid Profile: No results for input(s): CHOL, HDL, LDLCALC, TRIG, CHOLHDL, LDLDIRECT in the last 72 hours. Thyroid Function Tests: No results for input(s): TSH, T4TOTAL, FREET4, T3FREE, THYROIDAB in the last 72 hours. Anemia Panel: No results for input(s): VITAMINB12, FOLATE, FERRITIN, TIBC, IRON, RETICCTPCT in the last 72 hours. Urine analysis:    Component Value Date/Time   COLORURINE YELLOW 10/17/2018 2258   APPEARANCEUR CLEAR 10/17/2018 2258   LABSPEC 1.023 10/17/2018 2258   PHURINE 5.0 10/17/2018 2258   GLUCOSEU NEGATIVE 10/17/2018 2258   HGBUR NEGATIVE 10/17/2018 2258   BILIRUBINUR NEGATIVE 10/17/2018  2258   KETONESUR 20 (A) 10/17/2018 2258   PROTEINUR 100 (A) 10/17/2018 2258   UROBILINOGEN 0.2 08/02/2007 1310   NITRITE NEGATIVE 10/17/2018 2258   LEUKOCYTESUR NEGATIVE 10/17/2018 2258   Sepsis Labs: @LABRCNTIP (procalcitonin:4,lacticidven:4) )No results found for this or any previous visit (from the past 240 hour(s)).   Radiological Exams on Admission: Dg Chest 2 View  Result Date: 10/17/2018 CLINICAL DATA:  85 year old with right shoulder bruising. Fall. Confusion. EXAM: CHEST - 2 VIEW COMPARISON:  Radiograph 10/02/2018 FINDINGS: The cardiomediastinal contours are unchanged. Moderate retrocardiac hiatal hernia with air-fluid level. Pulmonary vasculature is normal. No consolidation, pleural effusion, or pneumothorax. No acute osseous abnormalities are seen. Surgical clips in the right axilla. IMPRESSION: 1. No acute chest findings. 2. Moderate hiatal hernia. Electronically Signed   By: Keith Rake M.D.   On: 10/17/2018 22:17   Dg Pelvis 1-2 Views  Result Date: 10/17/2018 CLINICAL DATA:  85 year old with right hip bruising. Fall. Confusion. EXAM: PELVIS - 1-2 VIEW COMPARISON:  None. FINDINGS: The cortical margins of the bony pelvis are intact. No fracture. Pubic symphysis and sacroiliac joints are congruent. Both femoral heads are well-seated in the respective acetabula. Degenerative change of the pubic symphysis. Stool ball distends the rectum. IMPRESSION: No pelvic fracture. Electronically Signed   By: Keith Rake M.D.   On: 10/17/2018 22:18   Dg Shoulder Right  Result Date: 10/17/2018 CLINICAL DATA:  85 year old with right shoulder bruising. Fall. Confusion. EXAM: RIGHT SHOULDER - 2+ VIEW COMPARISON:  None. FINDINGS: There is no evidence of fracture or dislocation. There is no evidence of arthropathy or other focal bone abnormality. Mild soft tissue prominence in the supraclavicular region may be soft tissue overlap 4 hematoma/bruising. IMPRESSION: No fracture or dislocation of the  right shoulder. Electronically Signed   By: Keith Rake M.D.   On: 10/17/2018 22:16   Dg Knee 2 Views Left  Result Date: 10/17/2018 CLINICAL DATA:  85 year old with left knee pain.  Fall. Confusion. EXAM: LEFT KNEE - 1-2 VIEW COMPARISON:  None. FINDINGS: Left knee arthroplasty in expected alignment. No periprosthetic lucency or fracture. There is been patellar resurfacing. No significant joint effusion. Vascular calcifications. IMPRESSION: Left knee arthroplasty without complication or acute fracture. Electronically Signed   By: Threasa Beards  Sanford M.D.   On: 10/17/2018 22:17   Ct Head Wo Contrast  Result Date: 10/17/2018 CLINICAL DATA:  Found down, fall, confusion EXAM: CT HEAD WITHOUT CONTRAST CT CERVICAL SPINE WITHOUT CONTRAST TECHNIQUE: Multidetector CT imaging of the head and cervical spine was performed following the standard protocol without intravenous contrast. Multiplanar CT image reconstructions of the cervical spine were also generated. COMPARISON:  None. FINDINGS: CT HEAD FINDINGS Brain: No evidence of acute infarction, hemorrhage, hydrocephalus, extra-axial collection or mass lesion/mass effect. Periventricular white matter hypodensity. Vascular: No hyperdense vessel or unexpected calcification. Skull: Normal. Negative for fracture or focal lesion. Sinuses/Orbits: No acute finding. Other: None. CT CERVICAL SPINE FINDINGS Alignment: Normal. Skull base and vertebrae: No acute fracture. No primary bone lesion or focal pathologic process. Soft tissues and spinal canal: No prevertebral fluid or swelling. No visible canal hematoma. Disc levels: Moderate multilevel disc space height loss and osteophytosis. Upper chest: Negative. Other: None. IMPRESSION: 1. No acute intracranial pathology. Small-vessel white matter disease. 2. No fracture or static subluxation of the cervical spine. Moderate multilevel disc degenerative disease and osteophytosis. Electronically Signed   By: Eddie Candle M.D.   On:  10/17/2018 21:39   Ct Cervical Spine Wo Contrast  Result Date: 10/17/2018 CLINICAL DATA:  Found down, fall, confusion EXAM: CT HEAD WITHOUT CONTRAST CT CERVICAL SPINE WITHOUT CONTRAST TECHNIQUE: Multidetector CT imaging of the head and cervical spine was performed following the standard protocol without intravenous contrast. Multiplanar CT image reconstructions of the cervical spine were also generated. COMPARISON:  None. FINDINGS: CT HEAD FINDINGS Brain: No evidence of acute infarction, hemorrhage, hydrocephalus, extra-axial collection or mass lesion/mass effect. Periventricular white matter hypodensity. Vascular: No hyperdense vessel or unexpected calcification. Skull: Normal. Negative for fracture or focal lesion. Sinuses/Orbits: No acute finding. Other: None. CT CERVICAL SPINE FINDINGS Alignment: Normal. Skull base and vertebrae: No acute fracture. No primary bone lesion or focal pathologic process. Soft tissues and spinal canal: No prevertebral fluid or swelling. No visible canal hematoma. Disc levels: Moderate multilevel disc space height loss and osteophytosis. Upper chest: Negative. Other: None. IMPRESSION: 1. No acute intracranial pathology. Small-vessel white matter disease. 2. No fracture or static subluxation of the cervical spine. Moderate multilevel disc degenerative disease and osteophytosis. Electronically Signed   By: Eddie Candle M.D.   On: 10/17/2018 21:39   US Abdomen Limited  Result Date: 10/17/2018 CLINICAL DATA:  Right upper quadrant pain for 1 day, elevated LFTs EXAM: ULTRASOUND ABDOMEN LIMITED RIGHT UPPER QUADRANT COMPARISON:  None. FINDINGS: Gallbladder: Gallbladder is well distended with evidence of cholelithiasis. Negative sonographic Murphy's sign is noted. Common bile duct: Diameter: 5 mm. Liver: No focal lesion identified. Within normal limits in parenchymal echogenicity. Portal vein is patent on color Doppler imaging with normal direction of blood flow towards the liver.  IMPRESSION: Cholelithiasis without complicating factors. Electronically Signed   By: Inez Catalina M.D.   On: 10/17/2018 23:20   Dg Humerus Right  Result Date: 10/17/2018 CLINICAL DATA:  85 year old with right shoulder bruising. Fall. Confusion. EXAM: RIGHT HUMERUS - 2+ VIEW COMPARISON:  None. FINDINGS: Cortical margins of the humerus are intact. There is no evidence of fracture or other focal bone lesions. Scattered soft tissue calcifications. Soft tissue fullness of the upper arm may be related to underlying bruising. IMPRESSION: No fracture of the right humerus. Electronically Signed   By: Keith Rake M.D.   On: 10/17/2018 22:19     Assessment/Plan Principal Problem:   Acute encephalopathy Active Problems:   Personal history  of malignant neoplasm of breast   Chronic anticoagulation   Essential hypertension   Atrial fibrillation, chronic   Gallstones    1. Acute encephalopathy and generalized weakness cause not clear.  MRI brain ammonia levels EEG all are pending.  Will gently hydrate. 2. Mild rhabdomyolysis likely from fall.  Gently hydrate hold Lipitor follow CK levels. 3. History of A. fib presently on apixaban since there is some concern about gallstones and elevated LFTs will hold apixaban and keep patient on heparin until be get HIDA scan to rule out acute cholecystitis. 4. Hypertension on ARB. 5. Leukocytosis could be reactionary or from chronic prednisone use.  However we are to rule out cholecystitis by HIDA scan. 6. Gallstones mildly elevated LFTs will check HIDA scan. 7. Breast cancer in remission. 8. History of hyperlipidemia holding statin due to elevated CK levels from fall. 9. History of nonspecific arthritis on prednisone. 10. Chronic back pain on hydrocodone and gabapentin.  Which may need to be held if patient gets more confused. 11. History of depression on Zoloft.   DVT prophylaxis: Heparin infusion. Code Status: Full code as confirmed with patient's  daughter. Family Communication: Patient's daughter. Disposition Plan: To be determined. Consults called: Physical therapy. Admission status: Observation.   Rise Patience MD [facility] Pager [phone].  If 7PM-7AM, please contact night-coverage [email or web address] Password [email or web address]  10/18/2018, 1:00 AM

## 2018-10-18 NOTE — Progress Notes (Signed)
MRI attempted to reach RN about pt, no answer

## 2018-10-18 NOTE — Progress Notes (Signed)
Pt with HR in 150s with A fib rhythm at 0545. EKG completed. Dr. Paula Compton order 10mg  of cardizem IV bolus and per order to administer cardizem CD morning dose given. BP recheck 159/79, HR 140s.

## 2018-10-18 NOTE — Procedures (Addendum)
History: 85 year old female being evaluated for encephalopathy.  Sedation: None  Technique: This is a 21 channel routine scalp EEG performed at the bedside with bipolar and monopolar montages arranged in accordance to the international 10/20 system of electrode placement. One channel was dedicated to EKG recording.    Background: There is a posterior dominant rhythm of 7 to 8 Hz.  In addition, there is intrusion into the background of generalized irregular delta and theta range activity.  Sleep is recorded with relatively symmetric appearing structures.  In addition, there are moderately frequent left frontal (Fp1 > F7, F3) isolated sharp wave discharges  Photic stimulation: Physiologic driving is not performed  EEG Abnormalities: 1) left frontal sharp waves  Clinical Interpretation: This EEG is consistent with an area of cortical irritability in the left frontal region consistent with a potential seizure focus in that area.   There was no seizure recorded on this study. Please note that lack of epileptiform activity on EEG does not preclude the possibility of epilepsy.   Roland Rack, MD [facility] [phone]  If 7pm- 7am, please page neurology on call as listed in [place].

## 2018-10-18 NOTE — Evaluation (Signed)
Physical Therapy Evaluation Patient Details Name: [name] MRN: [id number] DOB: 1925/10/21 Today's Date: 10/18/2018   History of Present Illness  85 y.o. admitted on 10/17/18 after being found down on the floor at her independent living apartment.  CT head and c-spine were negative, MRI negative, EEG pending.  Dx with acute encephaolpathy and generalized weakness, medical workup continues.  Mild rhabdomyolysis due to fall, rapid rate a-fib (with h/o A-fib).  Pt with other significant PMH of HTN, chronic back pain, breast CA, bil TKA, R mastectomy, and shoulder surgery.  Clinical Impression  Pt's HR is better this PM 90s-110s throughout session.  Daughter was present throughout helping with hx and reporting baseline.  Pt is weaker than usual, but was able to stand and take some side steps at the side of the bed with RW today.  She was limited by dizziness and HA EOB, so we did not push mobility much.  She is sore "all over".   PT to follow acutely for deficits listed below.      Follow Up Recommendations Home health PT;Supervision for mobility/OOB(resume home therapy)    Equipment Recommendations  None recommended by PT    Recommendations for Other Services   NA     Precautions / Restrictions Precautions Precautions: Fall      Mobility  Bed Mobility Overal bed mobility: Needs Assistance Bed Mobility: Supine to Sit;Sit to Supine     Supine to sit: Min assist;HOB elevated Sit to supine: Min assist   General bed mobility comments: Min assist to support trunk to come up to sitting EOB and lightly assist legs to return to supine in the bed.   Transfers Overall transfer level: Needs assistance Equipment used: Rolling walker (2 wheeled) Transfers: Sit to/from Stand Sit to Stand: Min assist         General transfer comment: Min assist to come to standing EOB, she attempted several times without help, but was unable to get up.    Ambulation/Gait Ambulation/Gait assistance:  Min assist Gait Distance (Feet): 3 Feet Assistive device: Rolling walker (2 wheeled) Gait Pattern/deviations: Step-to pattern     General Gait Details: Pt was able to side step a few times at the side of the bed.  We did not do much more gait due to reports of lightheadedness, HA.  HR stayed 90-110s throughout session in A-fib.          Balance Overall balance assessment: Needs assistance Sitting-balance support: Feet supported;Bilateral upper extremity supported Sitting balance-Leahy Scale: Fair Sitting balance - Comments: close supervision EOB, attempted to donn socks, but could not reach down that far (usually puts on her knee high hose by herself).     Standing balance support: Bilateral upper extremity supported Standing balance-Leahy Scale: Poor Standing balance comment: needs support from RW and therapist in standing.                              Pertinent Vitals/Pain Pain Assessment: Faces Faces Pain Scale: Hurts even more Pain Location: generalized, per daughter she has bruises "everywhere" Pain Descriptors / Indicators: Grimacing;Guarding Pain Intervention(s): Limited activity within patient's tolerance;Monitored during session;Repositioned    Home Living Family/patient expects to be discharged to:: Private residence Living Arrangements: Alone Available Help at Discharge: Personal care attendant Type of Home: Independent living facility Home Access: Level entry     Home Layout: One level Home Equipment: Environmental consultant - 4 wheels      Prior  Function Level of Independence: Needs assistance   Gait / Transfers Assistance Needed: Pt was mod I ambulating with her rollator.             Hand Dominance   Dominant Hand: Right    Extremity/Trunk Assessment   Upper Extremity Assessment Upper Extremity Assessment: Defer to OT evaluation(R shoulder seems sore)    Lower Extremity Assessment Lower Extremity Assessment: Generalized weakness    Cervical /  Trunk Assessment Cervical / Trunk Assessment: Kyphotic  Communication   Communication: HOH  Cognition Arousal/Alertness: Lethargic(got ativan for MRI earlier today) Behavior During Therapy: WFL for tasks assessed/performed Overall Cognitive Status: Impaired/Different from baseline Area of Impairment: Orientation;Attention;Memory;Following commands;Safety/judgement;Awareness;Problem solving                 Orientation Level: Disoriented to;Place;Time;Person;Situation Current Attention Level: Sustained Memory: Decreased short-term memory(has memory deficits at baseline per daughter) Following Commands: Follows one step commands consistently Safety/Judgement: Decreased awareness of safety;Decreased awareness of deficits Awareness: Intellectual Problem Solving: Slow processing;Decreased initiation;Difficulty sequencing;Requires verbal cues;Requires tactile cues General Comments: As far as her daughter knows she did not hit her head during her fall (no noticeable bruising on her scalp.  When asked who her daughter is, she says, "my mother", she does not know where she is today or why.  Per daughter she normally has some baseline memory deficits, but is much sharper than her current presentation.  She did recognize her greatgrandson, Mason on facetime.              Assessment/Plan    PT Assessment Patient needs continued PT services  PT Problem List Decreased strength;Decreased range of motion;Decreased activity tolerance;Decreased balance;Decreased mobility;Decreased cognition;Decreased knowledge of use of DME;Decreased knowledge of precautions;Cardiopulmonary status limiting activity;Pain       PT Treatment Interventions DME instruction;Gait training;Functional mobility training;Therapeutic activities;Therapeutic exercise;Balance training;Patient/family education;Modalities    PT Goals (Current goals can be found in the Care Plan section)  Acute Rehab PT Goals Patient Stated  Goal: none stated, daughter wants her back to baseline PT Goal Formulation: With patient/family Time For Goal Achievement: 11/01/18 Potential to Achieve Goals: Good    Frequency Min 3X/week           AM-PAC PT "6 Clicks" Mobility  Outcome Measure Help needed turning from your back to your side while in a flat bed without using bedrails?: A Little Help needed moving from lying on your back to sitting on the side of a flat bed without using bedrails?: A Little Help needed moving to and from a bed to a chair (including a wheelchair)?: A Little Help needed standing up from a chair using your arms (e.g., wheelchair or bedside chair)?: A Little Help needed to walk in hospital room?: A Little Help needed climbing 3-5 steps with a railing? : A Lot 6 Click Score: 17    End of Session   Activity Tolerance: Patient limited by fatigue;Patient limited by pain Patient left: in bed;with call bell/phone within reach;with bed alarm set;with family/visitor present   PT Visit Diagnosis: Muscle weakness (generalized) (M62.81);Difficulty in walking, not elsewhere classified (R26.2);Pain Pain - Right/Left: Right Pain - part of body: Shoulder    Time: 1430-1506 PT Time Calculation (min) (ACUTE ONLY): 36 min   Charges:         Wells Guiles B. Ivone Licht, PT, DPT  Acute Rehabilitation [phone] pager [phone]) [phone] office  @ [facility]: [phone]   PT Evaluation $PT Eval Moderate Complexity: 1 Mod PT Treatments $Therapeutic Activity: 8-22  mins        10/18/2018, 3:22 PM

## 2018-10-18 NOTE — Progress Notes (Signed)
MD Reesa Chew notified about pt converting to NSR in the 70s. Cardizem drip not given.

## 2018-10-18 NOTE — Progress Notes (Signed)
EEG completed, results pending. 

## 2018-10-18 NOTE — Progress Notes (Signed)
ANTICOAGULATION CONSULT NOTE - Initial Consult  Pharmacy Consult for Heparin Indication: atrial fibrillation  Allergies  Allergen Reactions  . Arimidex [Anastrozole] Other (See Comments)    Neck cramping  . Fish Allergy Other (See Comments)    Patient prefers to NOT eat this  . Morphine And Related Nausea And Vomiting  . Shellfish Allergy Other (See Comments)    Patient prefers to NOT eat this  . Vicodin [Hydrocodone-Acetaminophen] Nausea Only    DOES tolerate Vicodin/Norco, however    Patient Measurements: Weight: 169 lb 8.5 oz (76.9 kg)  Vital Signs: Temp: 97.5 F (36.4 C) (07/07 0347) Temp Source: Oral (07/07 0347) BP: 181/110 (07/07 0545) Pulse Rate: 155 (07/07 0545)  Labs: Recent Labs    10/17/18 2100 10/17/18 2250 10/17/18 2315 10/18/18 0503  HGB 16.4*  --   --  14.6  HCT 49.1*  --   --  43.4  PLT 261  --   --  254  APTT  --   --   --  24  LABPROT  --   --   --  14.3  INR  --   --   --  1.1  HEPARINUNFRC  --   --   --  0.28*  CREATININE 0.83 0.78  --   --   CKTOTAL  --   --  1,449*  --     Estimated Creatinine Clearance: 47 mL/min (by C-G formula based on SCr of 0.78 mg/dL).   Medical History: Past Medical History:  Diagnosis Date  . Arthritis    low spine  . Breast cancer [facility]) 2013   right breast  . Breast cancer, right breast, recurrent 06/03/2011  . Cancer [facility]) 1989   breast right  . Chronic back pain   . Hypertension     Medications:  Medications Prior to Admission  Medication Sig Dispense Refill Last Dose  . acetaminophen (TYLENOL) 500 MG tablet Take 500-1,000 mg by mouth every 8 (eight) hours as needed (for arthritis pain).   unk at [employer]  . atorvastatin (LIPITOR) 20 MG tablet TAKE 1 TABLET BY MOUTH EVERY DAY (Patient taking differently: Take 20 mg by mouth daily. ) 90 tablet 0 10/15/2018 at Unknown time  . calcium carbonate (OS-CAL) 600 MG TABS Take 600 mg by mouth daily.   Past Week at Unknown time  . Cholecalciferol (VITAMIN D3) 1000  UNITS CAPS Take 1,000 Units by mouth daily.    Past Week at Unknown time  . diltiazem (CARDIZEM CD) 120 MG 24 hr capsule TAKE 1 CAPSULE (120 MG TOTAL) BY MOUTH DAILY. PLEASE MAKE OVERDUE APPT (Patient taking differently: Take 120 mg by mouth daily. ) 30 capsule 0 10/15/2018 at Unknown time  . ELIQUIS 5 MG TABS tablet TAKE 1 TABLET BY MOUTH TWICE A DAY (Patient taking differently: Take 5 mg by mouth 2 (two) times daily. ) 60 tablet 5 10/15/2018 at 1900  . gabapentin (NEURONTIN) 100 MG capsule Take 100 mg by mouth at bedtime.    10/15/2018 at pm  . HYDROcodone-acetaminophen (NORCO/VICODIN) 5-325 MG tablet Take 1 tablet by mouth every 8 (eight) hours as needed for pain.  0 unk at unk  . losartan (COZAAR) 25 MG tablet TAKE 2 TABLETS BY MOUTH (50MG  TOTAL) EVERY DAY PT NEEDS OFFICE VISIT (Patient taking differently: Take 25 mg by mouth every morning. ) 30 tablet 0 10/15/2018 at Unknown time  . Multiple Vitamins-Minerals (CENTRUM SILVER 50+WOMEN PO) Take 1 tablet by mouth daily with breakfast.   Past  Week at Unknown time  . nitroGLYCERIN (NITROSTAT) 0.4 MG SL tablet Place 1 tablet (0.4 mg total) under the tongue every 5 (five) minutes x 3 doses as needed for chest pain. 25 tablet 2 unk at unk  . predniSONE (DELTASONE) 2.5 MG tablet Take 2.5 mg by mouth daily.  0 10/15/2018 at Unknown time  . RABEprazole (ACIPHEX) 20 MG tablet TAKE 1 TABLET BY MOUTH EVERY DAY (Patient taking differently: Take 20 mg by mouth at bedtime. ) 90 tablet 0 10/15/2018 at pm  . sertraline (ZOLOFT) 50 MG tablet Take 50 mg by mouth every morning.   3 10/15/2018 at Unknown time  . vitamin C (ASCORBIC ACID) 500 MG tablet Take 500 mg by mouth daily.   Past Week at Unknown time  . VITAMIN E PO Take 1,000 Units by mouth daily.    Past Week at Unknown time    Assessment: 85 y.o. female admitted s/p fall, h/o Afib and Eliquis on hold, for heparin Goal of Therapy:  APTT 66-102 Heparin level 0.3-0.7 units/ml Monitor platelets by anticoagulation protocol:  Yes   Plan:  Start heparin 1000 units/hr APTT in 8 hours  Minas Bonser, Bronson Curb 10/18/2018,5:48 AM

## 2018-10-18 NOTE — Consult Note (Addendum)
Cardiology Consultation:   Patient ID: [name] MRN: [id number]; DOB: Aug 26, 1925  Admit date: 10/17/2018 Date of Consult: 10/18/2018  Primary Care Provider: Mayra Neer, MD Primary Cardiologist: Makayla Klein, MD  Primary Electrophysiologist:  None    Patient Profile:   [name] is a 85 y.o. female with a hx of PAF on A/C, hypertension, chronic back pain, breast cancer s/p R mastectomy and arthritis taking prednisone who is being seen today for the evaluation of atrial fibrillation with RVR at the request of [name].  History of Present Illness:   [name] was admitted to the hospital on 10/17/18 after being found down on the floor at her independent living apartment. CT head was negative, MRI negative. She has acute encephalopathy and generalized weakness. Also found to have mild rhabdomyolysis related to the fall. She had been more confused in the prior 48 hrs according to the daughter (per ED note) and had not taken her meds for almost 48 hours. She was suspected to have been on the floor at least through the day. Pt was noted to be in afib with RVR later that morning.   She is being given IV fluids for rhabdo.   She has a hx of afib noted in 09/2016 during hospitalization for chest pain. She converted to SR wit diltiazem and was started on anticoagulation.    Now treated with diltiazem 120 mg and anticoagulated with apixaban 5 mg BID at home. Currently on heparin while in hospital.   On my assessment, [name] continues to be confused. She knows her name and that she lives in [place] but she does not know that she is in the hospital or anything about her fall. She is sore in multiple places on her body including neck, legs ankles. She denies chest pain. She notes possible occ shortness of breath. She also thinks she may have palpitations but cannot explain. She says she just feels tired and like she is down in a hole.   Pt has been given lopressor 5 mg IV, diltiazem  10 mg IV X2. Now restarted on home diltiazem 120 mg.  Currently her tele shows atrial flutter with variable AV conduction vs course afib in the 110's, down from the 130's earlier.     Heart Pathway Score:     Past Medical History:  Diagnosis Date   Arthritis    low spine   Breast cancer ([place]) 2013   right breast   Breast cancer, right breast, recurrent 06/03/2011   Cancer ([place]) 1989   breast right   Chronic back pain    Hypertension     Past Surgical History:  Procedure Laterality Date   ABDOMINAL HYSTERECTOMY  1974   BREAST SURGERY  1989   rt lumpectomy-axillary dissection   COLONOSCOPY     EYE SURGERY     both cataracts   JOINT REPLACEMENT  2004, 2005   both knees    MASTECTOMY Right 06/11/11   right breast   SHOULDER SURGERY     patient does not remember date of procedure     Home Medications:  Prior to Admission medications   Medication Sig Start Date End Date Taking? Authorizing Provider  acetaminophen (TYLENOL) 500 MG tablet Take 500-1,000 mg by mouth every 8 (eight) hours as needed (for arthritis pain).   Yes [provider]  atorvastatin (LIPITOR) 20 MG tablet TAKE 1 TABLET BY MOUTH EVERY DAY Patient taking differently: Take 20 mg by mouth daily.  09/21/17  Yes Croitoru, Mihai, MD  calcium carbonate (OS-CAL) 600 MG TABS Take 600 mg by mouth daily.   Yes [provider]  Cholecalciferol (VITAMIN D3) 1000 UNITS CAPS Take 1,000 Units by mouth daily.    Yes [provider]  diltiazem (CARDIZEM CD) 120 MG 24 hr capsule TAKE 1 CAPSULE (120 MG TOTAL) BY MOUTH DAILY. PLEASE MAKE OVERDUE APPT Patient taking differently: Take 120 mg by mouth daily.  09/26/18  Yes Croitoru, Mihai, MD  ELIQUIS 5 MG TABS tablet TAKE 1 TABLET BY MOUTH TWICE A DAY Patient taking differently: Take 5 mg by mouth 2 (two) times daily.  07/14/18  Yes Croitoru, Mihai, MD  gabapentin (NEURONTIN) 100 MG capsule Take 100 mg by mouth at bedtime.  04/03/11  Yes [provider]  HYDROcodone-acetaminophen (NORCO/VICODIN) 5-325 MG tablet Take 1 tablet by mouth every 8 (eight) hours as needed for pain. 01/25/18  Yes [provider]  losartan (COZAAR) 25 MG tablet TAKE 2 TABLETS BY MOUTH (50MG  TOTAL) EVERY DAY PT NEEDS OFFICE VISIT Patient taking differently: Take 25 mg by mouth every morning.  06/07/18  Yes Croitoru, Mihai, MD  Multiple Vitamins-Minerals (CENTRUM SILVER 50+WOMEN PO) Take 1 tablet by mouth daily with breakfast.   Yes [provider]  nitroGLYCERIN (NITROSTAT) 0.4 MG SL tablet Place 1 tablet (0.4 mg total) under the tongue every 5 (five) minutes x 3 doses as needed for chest pain. 09/13/16  Yes Kilroy, Luke K, PA-C  predniSONE (DELTASONE) 2.5 MG tablet Take 2.5 mg by mouth daily. 12/27/17  Yes [provider]  RABEprazole (ACIPHEX) 20 MG tablet TAKE 1 TABLET BY MOUTH EVERY DAY Patient taking differently: Take 20 mg by mouth at bedtime.  09/21/17  Yes Croitoru, Mihai, MD  sertraline (ZOLOFT) 50 MG tablet Take 50 mg by mouth every morning.  02/04/18  Yes [provider]  vitamin C (ASCORBIC ACID) 500 MG tablet Take 500 mg by mouth daily.   Yes [provider]  VITAMIN E PO Take 1,000 Units by mouth daily.    Yes [provider]    Inpatient Medications: Scheduled Meds:  calcium carbonate  1,250 mg Oral Q breakfast   diltiazem  120 mg Oral Daily   gabapentin  100 mg Oral QHS   losartan  25 mg Oral q morning - 10a   pantoprazole  40 mg Oral Daily   predniSONE  2.5 mg Oral Q breakfast   sertraline  50 mg Oral q morning - 10a   vitamin C  500 mg Oral Daily   Continuous Infusions:  sodium chloride 75 mL/hr at 10/18/18 1441   heparin 1,000 Units/hr (10/18/18 1441)   PRN Meds: alum & mag hydroxide-simeth, hydrALAZINE, HYDROcodone-acetaminophen, hydrocortisone, hydrocortisone cream, ipratropium-albuterol, lip balm, loratadine, metoprolol tartrate, Muscle Rub, ondansetron **OR**  ondansetron (ZOFRAN) IV, phenol, polyethylene glycol, polyvinyl alcohol, senna-docusate, sodium chloride  Allergies:    Allergies  Allergen Reactions   Arimidex [Anastrozole] Other (See Comments)    Neck cramping   Fish Allergy Other (See Comments)    Patient prefers to NOT eat this   Morphine And Related Nausea And Vomiting   Shellfish Allergy Other (See Comments)    Patient prefers to NOT eat this   Vicodin [Hydrocodone-Acetaminophen] Nausea Only    DOES tolerate Vicodin/Norco, however    Social History:   Social History   Socioeconomic History   Marital status: Married    Spouse name: Not on file   Number of children: Not on file  Years of education: Not on file   Highest education level: Not on file  Occupational History   Not on file  Social Needs   Financial resource strain: Not on file   Food insecurity    Worry: Not on file    Inability: Not on file   Transportation needs    Medical: Not on file    Non-medical: Not on file  Tobacco Use   Smoking status: Former Smoker    Quit date: 05/11/1970    Years since quitting: 48.4   Smokeless tobacco: Never Used  Substance and Sexual Activity   Alcohol use: No   Drug use: No   Sexual activity: Not on file  Lifestyle   Physical activity    Days per week: Not on file    Minutes per session: Not on file   Stress: Not on file  Relationships   Social connections    Talks on phone: Not on file    Gets together: Not on file    Attends religious service: Not on file    Active member of club or organization: Not on file    Attends meetings of clubs or organizations: Not on file    Relationship status: Not on file   Intimate partner violence    Fear of current or ex partner: Not on file    Emotionally abused: Not on file    Physically abused: Not on file    Forced sexual activity: Not on file  Other Topics Concern   Not on file  Social History Narrative   Not on file    Family History:      Family History  Problem Relation Age of Onset   Heart disease Mother    Breast cancer Maternal Aunt    Cancer Brother        unaware   Heart disease Brother      ROS:  Please see the history of present illness.   All other ROS reviewed and negative.     Physical Exam/Data:   Vitals:   10/18/18 0635 10/18/18 0743 10/18/18 1242 10/18/18 1649  BP: (!) 159/79 (!) 156/81 (!) 135/59 135/83  Pulse: (!) 140 69 (!) 140 (!) 107  Resp:  15  19  Temp:  98.9 F (37.2 C) 98.7 F (37.1 C) 98.2 F (36.8 C)  TempSrc:  Oral Oral Oral  SpO2: 97% 96% 93% 95%  Weight:       No intake or output data in the 24 hours ending 10/18/18 1711 Last 3 Weights 10/18/2018 10/02/2018 06/01/2017  Weight (lbs) 169 lb 8.5 oz 182 lb 1.6 oz 182 lb  Weight (kg) 76.9 kg 82.6 kg 82.555 kg     Body mass index is 28.21 kg/m.  General:  Frail elderly female, in no acute distress HEENT: normal Lymph: no adenopathy Neck: no JVD Endocrine:  No thryomegaly Vascular: No carotid bruits; FA pulses 2+ bilaterally without bruits  Cardiac:  normal S1, S2; Irregularly irregular rhythm, tachycardic; no murmur  Lungs:  clear to auscultation bilaterally, no wheezing, rhonchi or rales  Abd: soft, nontender, no hepatomegaly  Ext: no edema Musculoskeletal:  No deformities, BUE and BLE strength normal and equal Skin: warm and dry  Neuro:  CNs 2-12 intact, no focal abnormalities noted Psych:  Normal affect   EKG:  The EKG was personally reviewed and demonstrates:  Atrial flutter with variable A-V block with premature ventricular or aberrantly conducted complexes, 150 bpm, LAD, Inferior infarct , age undetermined Possible  Anterior infarct , age undetermined Telemetry:  Telemetry was personally reviewed and demonstrates:  atrial flutter with variable AV conduction in the 110's, down from the 130's earlier.  Relevant CV Studies:  Echocardiogram 09/13/2016 Study Conclusions  - Left ventricle: The cavity size was normal.  There was mild   concentric hypertrophy. Systolic function was normal. The   estimated ejection fraction was in the range of 55% to 60%. Wall   motion was normal; there were no regional wall motion   abnormalities. Doppler parameters are consistent with abnormal   left ventricular relaxation (grade 1 diastolic dysfunction). - Aortic valve: There was trivial regurgitation. - Mitral valve: Calcified annulus. - Left atrium: The atrium was mildly dilated.   Laboratory Data:  High Sensitivity Troponin:   Recent Labs  Lab 10/18/18 0503 10/18/18 0614  TROPONINIHS 19* 22*     Cardiac EnzymesNo results for input(s): TROPONINI in the last 168 hours. No results for input(s): TROPIPOC in the last 168 hours.  Chemistry Recent Labs  Lab 10/17/18 2100 10/17/18 2250 10/18/18 0503  NA 138 142 141  K 6.0* 3.5 3.3*  CL 101 102 104  CO2 24 26 23   GLUCOSE 156* 161* 204*  BUN 17 17 18   CREATININE 0.83 0.78 0.76  CALCIUM 9.3 9.3 9.0  GFRNONAA >60 >60 >60  GFRAA >60 >60 >60  ANIONGAP 13 14 14     Recent Labs  Lab 10/17/18 2100 10/18/18 0614  PROT 7.2 6.7  ALBUMIN 3.9 3.5  AST 112* 56*  ALT 35 38  ALKPHOS 60 60  BILITOT 2.9* 1.3*   Hematology Recent Labs  Lab 10/17/18 2100 10/18/18 0503  WBC 15.1* 15.8*  RBC 5.26* 4.59  HGB 16.4* 14.6  HCT 49.1* 43.4  MCV 93.3 94.6  MCH 31.2 31.8  MCHC 33.4 33.6  RDW 12.8 12.8  PLT 261 254   BNPNo results for input(s): BNP, PROBNP in the last 168 hours.  DDimer No results for input(s): DDIMER in the last 168 hours.   Radiology/Studies:  Dg Chest 2 View  Result Date: 10/17/2018 CLINICAL DATA:  85 year old with right shoulder bruising. Fall. Confusion. EXAM: CHEST - 2 VIEW COMPARISON:  Radiograph 10/02/2018 FINDINGS: The cardiomediastinal contours are unchanged. Moderate retrocardiac hiatal hernia with air-fluid level. Pulmonary vasculature is normal. No consolidation, pleural effusion, or pneumothorax. No acute osseous abnormalities are  seen. Surgical clips in the right axilla. IMPRESSION: 1. No acute chest findings. 2. Moderate hiatal hernia. Electronically Signed   By: Keith Rake M.D.   On: 10/17/2018 22:17   Dg Pelvis 1-2 Views  Result Date: 10/17/2018 CLINICAL DATA:  85 year old with right hip bruising. Fall. Confusion. EXAM: PELVIS - 1-2 VIEW COMPARISON:  None. FINDINGS: The cortical margins of the bony pelvis are intact. No fracture. Pubic symphysis and sacroiliac joints are congruent. Both femoral heads are well-seated in the respective acetabula. Degenerative change of the pubic symphysis. Stool ball distends the rectum. IMPRESSION: No pelvic fracture. Electronically Signed   By: Keith Rake M.D.   On: 10/17/2018 22:18   Dg Shoulder Right  Result Date: 10/17/2018 CLINICAL DATA:  85 year old with right shoulder bruising. Fall. Confusion. EXAM: RIGHT SHOULDER - 2+ VIEW COMPARISON:  None. FINDINGS: There is no evidence of fracture or dislocation. There is no evidence of arthropathy or other focal bone abnormality. Mild soft tissue prominence in the supraclavicular region may be soft tissue overlap 4 hematoma/bruising. IMPRESSION: No fracture or dislocation of the right shoulder. Electronically Signed   By: Aurther Loft.D.  On: 10/17/2018 22:16   Dg Knee 2 Views Left  Result Date: 10/17/2018 CLINICAL DATA:  85 year old with left knee pain.  Fall. Confusion. EXAM: LEFT KNEE - 1-2 VIEW COMPARISON:  None. FINDINGS: Left knee arthroplasty in expected alignment. No periprosthetic lucency or fracture. There is been patellar resurfacing. No significant joint effusion. Vascular calcifications. IMPRESSION: Left knee arthroplasty without complication or acute fracture. Electronically Signed   By: Keith Rake M.D.   On: 10/17/2018 22:17   Ct Head Wo Contrast  Result Date: 10/17/2018 CLINICAL DATA:  Found down, fall, confusion EXAM: CT HEAD WITHOUT CONTRAST CT CERVICAL SPINE WITHOUT CONTRAST TECHNIQUE: Multidetector CT  imaging of the head and cervical spine was performed following the standard protocol without intravenous contrast. Multiplanar CT image reconstructions of the cervical spine were also generated. COMPARISON:  None. FINDINGS: CT HEAD FINDINGS Brain: No evidence of acute infarction, hemorrhage, hydrocephalus, extra-axial collection or mass lesion/mass effect. Periventricular white matter hypodensity. Vascular: No hyperdense vessel or unexpected calcification. Skull: Normal. Negative for fracture or focal lesion. Sinuses/Orbits: No acute finding. Other: None. CT CERVICAL SPINE FINDINGS Alignment: Normal. Skull base and vertebrae: No acute fracture. No primary bone lesion or focal pathologic process. Soft tissues and spinal canal: No prevertebral fluid or swelling. No visible canal hematoma. Disc levels: Moderate multilevel disc space height loss and osteophytosis. Upper chest: Negative. Other: None. IMPRESSION: 1. No acute intracranial pathology. Small-vessel white matter disease. 2. No fracture or static subluxation of the cervical spine. Moderate multilevel disc degenerative disease and osteophytosis. Electronically Signed   By: Eddie Candle M.D.   On: 10/17/2018 21:39   Ct Cervical Spine Wo Contrast  Result Date: 10/17/2018 CLINICAL DATA:  Found down, fall, confusion EXAM: CT HEAD WITHOUT CONTRAST CT CERVICAL SPINE WITHOUT CONTRAST TECHNIQUE: Multidetector CT imaging of the head and cervical spine was performed following the standard protocol without intravenous contrast. Multiplanar CT image reconstructions of the cervical spine were also generated. COMPARISON:  None. FINDINGS: CT HEAD FINDINGS Brain: No evidence of acute infarction, hemorrhage, hydrocephalus, extra-axial collection or mass lesion/mass effect. Periventricular white matter hypodensity. Vascular: No hyperdense vessel or unexpected calcification. Skull: Normal. Negative for fracture or focal lesion. Sinuses/Orbits: No acute finding. Other: None. CT  CERVICAL SPINE FINDINGS Alignment: Normal. Skull base and vertebrae: No acute fracture. No primary bone lesion or focal pathologic process. Soft tissues and spinal canal: No prevertebral fluid or swelling. No visible canal hematoma. Disc levels: Moderate multilevel disc space height loss and osteophytosis. Upper chest: Negative. Other: None. IMPRESSION: 1. No acute intracranial pathology. Small-vessel white matter disease. 2. No fracture or static subluxation of the cervical spine. Moderate multilevel disc degenerative disease and osteophytosis. Electronically Signed   By: Eddie Candle M.D.   On: 10/17/2018 21:39   Mr Brain Wo Contrast  Result Date: 10/18/2018 CLINICAL DATA:  Altered level of consciousness, unexplained EXAM: MRI HEAD WITHOUT CONTRAST TECHNIQUE: Multiplanar, multiecho pulse sequences of the brain and surrounding structures were obtained without intravenous contrast. COMPARISON:  Head CT from yesterday FINDINGS: Brain: No acute infarction, hemorrhage, hydrocephalus, extra-axial collection or mass lesion. Moderate chronic small vessel ischemia in this cerebral white matter. Mild to moderate cerebral volume loss without specific pattern. Vascular: Major flow voids are preserved Skull and upper cervical spine: Negative for marrow lesion. Cervical spine degeneration as seen by CT yesterday. Sinuses/Orbits: Negative Other: Left more than right mastoid opacification with negative nasopharynx. On the more extensive left this is chronic based on sclerosis in 2012 comparison IMPRESSION: Senescent changes without  acute or reversible finding. Electronically Signed   By: Monte Fantasia M.D.   On: 10/18/2018 12:16   Nm Hepato W/eject Fract  Result Date: 10/18/2018 CLINICAL DATA:  Abdominal tenderness and elevated liver enzymes EXAM: NUCLEAR MEDICINE HEPATOBILIARY IMAGING WITH GALLBLADDER EF VIEWS: Anterior right upper quadrant RADIOPHARMACEUTICALS:  4.7 mCi Tc-9m  Choletec IV COMPARISON:  None. FINDINGS:  Liver uptake of radiotracer is unremarkable. There is prompt visualization of gallbladder and small bowel, indicating patency of the cystic and common bile ducts. The patient consumed 8 ounces of Ensure Enlive with calculation of the computer generated ejection fraction of radiotracer from the gallbladder. The patient did not experience clinical symptoms with the oral Ensure consumption. The computer generated ejection fraction of radiotracer from the gallbladder is normal at 89%, normal greater than 33% using the oral agent. IMPRESSION: Study within normal limits. Electronically Signed   By: Lowella Grip III M.D.   On: 10/18/2018 11:06   US Abdomen Limited  Result Date: 10/17/2018 CLINICAL DATA:  Right upper quadrant pain for 1 day, elevated LFTs EXAM: ULTRASOUND ABDOMEN LIMITED RIGHT UPPER QUADRANT COMPARISON:  None. FINDINGS: Gallbladder: Gallbladder is well distended with evidence of cholelithiasis. Negative sonographic Murphy's sign is noted. Common bile duct: Diameter: 5 mm. Liver: No focal lesion identified. Within normal limits in parenchymal echogenicity. Portal vein is patent on color Doppler imaging with normal direction of blood flow towards the liver. IMPRESSION: Cholelithiasis without complicating factors. Electronically Signed   By: Inez Catalina M.D.   On: 10/17/2018 23:20   Dg Humerus Right  Result Date: 10/17/2018 CLINICAL DATA:  85 year old with right shoulder bruising. Fall. Confusion. EXAM: RIGHT HUMERUS - 2+ VIEW COMPARISON:  None. FINDINGS: Cortical margins of the humerus are intact. There is no evidence of fracture or other focal bone lesions. Scattered soft tissue calcifications. Soft tissue fullness of the upper arm may be related to underlying bruising. IMPRESSION: No fracture of the right humerus. Electronically Signed   By: Keith Rake M.D.   On: 10/17/2018 22:19    Assessment and Plan:   1. Atrial flutter vs course afib with RVR -Hx of PAF with first episode in  09/2016. Has had no cardiology follow up since.  -Presented after an unwitnessed fall and being down for probably at least a day. She had had increased confusion for 48 hrs prior and continues to be confused. Pt still confused and unable to give much history.  Now with mild rhabdo, on IV fluids. -Home treatment includes diltiazem 120 mg daily however, pt was confused over prior 48hrs and had not taken her meds - per the daughter.  -Pt has been given lopressor 5 mg IV, diltiazem 10 mg IV X2. Now restarted on home diltiazem 120 mg.  -On apixaban 5 mg BID for anticoagulation for stroke risk reduction related to afib at home, but probably missed at least a couple of days dosing. Now on heparin in hospital.  -TSH normal, 2.888 -HS troponins low at 19 and 22, flat trend. Not consistent with ACS.  -Rates slightly improved from 130's-110's with resumption of home diltiazem. -Unclear if aflutter related to fall and being down or if being in aflutter with RVR contributed to weakness and fall.  -Pt is currently stable and comfortable. Will increase rate control.  Switch to IV diltiazem drip and see if she converts. Continue heparin until pt mental status improves.   2. Hypertension -Home meds include losartan 50 mg daily, diltiazem 120 mg daily, continued in hospital. As above  switching oral diltiazem to IV drip.  -BP initially elevated, had not taken home meds. Currently improved.   3. Hyperlipidemia -On atorvastatin 20 mg daily. Followed by PCP.   4. Rhabdomyolysis related to fall -CK 1449 > 1114. AST mildly elevated, improving.  -SCr normal.  -On IV fluids.   5. Encephalopathy -Unclear etiology. Workup per IM. Hopefully will continue to improve.       For questions or updates, please contact [place] Please consult [email or web address] for contact info under     Signed, Daune Perch, NP  10/18/2018 5:11 PM   Agree with note by Daune Perch NP-C  We are asked to see Ms. Villeda for atrial  fibrillation.  She is 85 year old female who lives in a nursing home.  She is seen Dr. Sallyanne Kuster in the past.  Her last EKG in June was sinus rhythm.  She has normal LV function by 2D echo.  When she was in A. fib previously she was treated with IV diltiazem and converted to sinus rhythm and was placed on p.o. Cardizem CD 120 as well as Eliquis.  She apparently was found down in her room for 24 hours and has rhabdomyolysis though her serum creatinine is normal.  She is somewhat confused.  She has been hydrated.  She was given boluses of IV diltiazem and Lopressor with improvement in her heart rate down to the 130 range.  The etiology of her altered mental status is still unclear why she was found on the ground.  Head CT was negative.  I recommend switching to IV diltiazem for rate control.  Potentially she will convert back to sinus rhythm patient is currently on heparin and can be transition back to Eliquis when she becomes more stable.    Lorretta Harp, M.D., [place], [facility] [place] [address] [place], [place]  [id number]  [phone] 10/18/2018 6:08 PM

## 2018-10-18 NOTE — Progress Notes (Signed)
Arrived from ED to 3w37. Alert and oriented to self. Follow command. Stated abdominal pain but unable to rate it. No IV line noted. Will notify IV team for insertion. Call light within reach.

## 2018-10-18 NOTE — ED Notes (Signed)
Admitting provider at bedside for evaluation.

## 2018-10-18 NOTE — Progress Notes (Signed)
PT Cancellation Note  Patient Details Name: [name] MRN: [id number] DOB: April 28, 1925   Cancelled Treatment:    Reason Eval/Treat Not Completed: Other (comment);Patient at procedure or test/unavailable;Medical issues which prohibited therapy.  Pt is not currently in her room and HR on monitor is 150.  PT will check back later today or tomorrow as time allows.   Thanks,  Barbarann Ehlers. Erlean Mealor, PT, DPT  Acute Rehabilitation [phone] pager [phone] office  @ [facility]: [phone]     Harvie Heck 10/18/2018, 12:06 PM

## 2018-10-18 NOTE — ED Notes (Signed)
ED TO INPATIENT HANDOFF REPORT  ED Nurse Name and Phone #: Caryl Pina RN [phone]  S Name/Age/Gender Grayling Congress 85 y.o. female Room/Bed: 024C/024C  Code Status   Code Status: Prior  Home/SNF/Other Nursing Home Patient oriented to: self Is this baseline? Yes   Triage Complete: Triage complete  Chief Complaint Fall   Triage Note Pt in be EMS after fall.  Unknown if this AM or earlier.  Pt found down in her room, still her pj's and bed not made.  Staff also stated she did not come for lunch.  Hx of mild confusion/ forgetfulness.  Pt doesn't remember her fall.  Bruising noted to R hip and shoulder with pain noted.  Pt alert to self and day only.  Pleasant without noted distress.      Allergies Allergies  Allergen Reactions  . Arimidex [Anastrozole] Other (See Comments)    Neck cramping  . Fish Allergy Other (See Comments)    Patient prefers to NOT eat this  . Morphine And Related Nausea And Vomiting  . Shellfish Allergy Other (See Comments)    Patient prefers to NOT eat this  . Vicodin [Hydrocodone-Acetaminophen] Nausea Only    DOES tolerate Vicodin/Norco, however    Level of Care/Admitting Diagnosis ED Disposition    ED Disposition Condition [facility] Area: [place] [100100]  Level of Care: Telemetry Medical [104]  I expect the patient will be discharged within 24 hours: No (not a candidate for 5C-Observation unit)  Covid Evaluation: Asymptomatic Screening Protocol (No Symptoms)  Diagnosis: Acute encephalopathy [765465]  Admitting Physician: Rise Patience [phone]  Attending Physician: Rise Patience [email or web address]  PT Class (Do Not Modify): Observation [104]  PT Acc Code (Do Not Modify): Observation [10022]       B Medical/Surgery History Past Medical History:  Diagnosis Date  . Arthritis    low spine  . Breast cancer [facility]) 2013   right breast  . Breast cancer, right breast, recurrent 06/03/2011  . Cancer [facility]) 1989    breast right  . Chronic back pain   . Hypertension    Past Surgical History:  Procedure Laterality Date  . ABDOMINAL HYSTERECTOMY  1974  . BREAST SURGERY  1989   rt lumpectomy-axillary dissection  . COLONOSCOPY    . EYE SURGERY     both cataracts  . JOINT REPLACEMENT  2004, 2005   both knees   . MASTECTOMY Right 06/11/11   right breast  . SHOULDER SURGERY     patient does not remember date of procedure     A IV Location/Drains/Wounds Patient Lines/Drains/Airways Status   Active Line/Drains/Airways    Name:   Placement date:   Placement time:   Site:   Days:   Peripheral IV 10/17/18 Left Antecubital   10/17/18    2100    Antecubital   1          Intake/Output Last 24 hours No intake or output data in the 24 hours ending 10/18/18 0055  Labs/Imaging Results for orders placed or performed during the hospital encounter of 10/17/18 (from the past 48 hour(s))  POC CBG, ED     Status: Abnormal   Collection Time: 10/17/18  8:37 PM  Result Value Ref Range   Glucose-Capillary 156 (H) 70 - 99 mg/dL  CBC with Differential     Status: Abnormal   Collection Time: 10/17/18  9:00 PM  Result Value Ref Range   WBC 15.1 (H) 4.0 -  10.5 K/uL   RBC 5.26 (H) 3.87 - 5.11 MIL/uL   Hemoglobin 16.4 (H) 12.0 - 15.0 g/dL   HCT 49.1 (H) 36.0 - 46.0 %   MCV 93.3 80.0 - 100.0 fL   MCH 31.2 26.0 - 34.0 pg   MCHC 33.4 30.0 - 36.0 g/dL   RDW 12.8 11.5 - 15.5 %   Platelets 261 150 - 400 K/uL   nRBC 0.0 0.0 - 0.2 %   Neutrophils Relative % 83 %   Neutro Abs 12.5 (H) 1.7 - 7.7 K/uL   Lymphocytes Relative 10 %   Lymphs Abs 1.5 0.7 - 4.0 K/uL   Monocytes Relative 7 %   Monocytes Absolute 1.1 (H) 0.1 - 1.0 K/uL   Eosinophils Relative 0 %   Eosinophils Absolute 0.0 0.0 - 0.5 K/uL   Basophils Relative 0 %   Basophils Absolute 0.0 0.0 - 0.1 K/uL   Immature Granulocytes 0 %   Abs Immature Granulocytes 0.06 0.00 - 0.07 K/uL    Comment: Performed at [facility] [address]  [place], [place] [address]  Basic metabolic panel     Status: Abnormal   Collection Time: 10/17/18  9:00 PM  Result Value Ref Range   Sodium 138 135 - 145 mmol/L   Potassium 6.0 (H) 3.5 - 5.1 mmol/L    Comment: SPECIMEN HEMOLYZED. HEMOLYSIS MAY AFFECT INTEGRITY OF RESULTS.   Chloride 101 98 - 111 mmol/L   CO2 24 22 - 32 mmol/L   Glucose, Bld 156 (H) 70 - 99 mg/dL   BUN 17 8 - 23 mg/dL   Creatinine, Ser 0.83 0.44 - 1.00 mg/dL   Calcium 9.3 8.9 - 10.3 mg/dL   GFR calc non Af Amer >60 >60 mL/min   GFR calc Af Amer >60 >60 mL/min   Anion gap 13 5 - 15    Comment: Performed at [facility] [address] [place], [place] [address]  Hepatic function panel     Status: Abnormal   Collection Time: 10/17/18  9:00 PM  Result Value Ref Range   Total Protein 7.2 6.5 - 8.1 g/dL   Albumin 3.9 3.5 - 5.0 g/dL   AST 112 (H) 15 - 41 U/L   ALT 35 0 - 44 U/L   Alkaline Phosphatase 60 38 - 126 U/L   Total Bilirubin 2.9 (H) 0.3 - 1.2 mg/dL   Bilirubin, Direct 1.1 (H) 0.0 - 0.2 mg/dL   Indirect Bilirubin 1.8 (H) 0.3 - 0.9 mg/dL    Comment: Performed at [facility] [address] [place], [place] [address]  Basic metabolic panel     Status: Abnormal   Collection Time: 10/17/18 10:50 PM  Result Value Ref Range   Sodium 142 135 - 145 mmol/L   Potassium 3.5 3.5 - 5.1 mmol/L   Chloride 102 98 - 111 mmol/L   CO2 26 22 - 32 mmol/L   Glucose, Bld 161 (H) 70 - 99 mg/dL   BUN 17 8 - 23 mg/dL   Creatinine, Ser 0.78 0.44 - 1.00 mg/dL   Calcium 9.3 8.9 - 10.3 mg/dL   GFR calc non Af Amer >60 >60 mL/min   GFR calc Af Amer >60 >60 mL/min   Anion gap 14 5 - 15    Comment: Performed at [facility], [place] [address], [place], [place] [address]  Urinalysis, Routine w reflex microscopic     Status: Abnormal   Collection Time: 10/17/18 10:58  PM  Result Value Ref Range   Color, Urine YELLOW YELLOW   APPearance CLEAR CLEAR   Specific Gravity, Urine 1.023 1.005 - 1.030   pH 5.0 5.0 - 8.0    Glucose, UA NEGATIVE NEGATIVE mg/dL   Hgb urine dipstick NEGATIVE NEGATIVE   Bilirubin Urine NEGATIVE NEGATIVE   Ketones, ur 20 (A) NEGATIVE mg/dL   Protein, ur 100 (A) NEGATIVE mg/dL   Nitrite NEGATIVE NEGATIVE   Leukocytes,Ua NEGATIVE NEGATIVE   RBC / HPF 0-5 0 - 5 RBC/hpf   Bacteria, UA NONE SEEN NONE SEEN   Squamous Epithelial / LPF 0-5 0 - 5   Mucus PRESENT     Comment: Performed at [facility], [place] [address] [place], [place] [address]   Dg Chest 2 View  Result Date: 10/17/2018 CLINICAL DATA:  85 year old with right shoulder bruising. Fall. Confusion. EXAM: CHEST - 2 VIEW COMPARISON:  Radiograph 10/02/2018 FINDINGS: The cardiomediastinal contours are unchanged. Moderate retrocardiac hiatal hernia with air-fluid level. Pulmonary vasculature is normal. No consolidation, pleural effusion, or pneumothorax. No acute osseous abnormalities are seen. Surgical clips in the right axilla. IMPRESSION: 1. No acute chest findings. 2. Moderate hiatal hernia. Electronically Signed   By: Keith Rake M.D.   On: 10/17/2018 22:17   Dg Pelvis 1-2 Views  Result Date: 10/17/2018 CLINICAL DATA:  85 year old with right hip bruising. Fall. Confusion. EXAM: PELVIS - 1-2 VIEW COMPARISON:  None. FINDINGS: The cortical margins of the bony pelvis are intact. No fracture. Pubic symphysis and sacroiliac joints are congruent. Both femoral heads are well-seated in the respective acetabula. Degenerative change of the pubic symphysis. Stool ball distends the rectum. IMPRESSION: No pelvic fracture. Electronically Signed   By: Keith Rake M.D.   On: 10/17/2018 22:18   Dg Shoulder Right  Result Date: 10/17/2018 CLINICAL DATA:  85 year old with right shoulder bruising. Fall. Confusion. EXAM: RIGHT SHOULDER - 2+ VIEW COMPARISON:  None. FINDINGS: There is no evidence of fracture or dislocation. There is no evidence of arthropathy or other focal bone abnormality. Mild soft tissue prominence in the supraclavicular  region may be soft tissue overlap 4 hematoma/bruising. IMPRESSION: No fracture or dislocation of the right shoulder. Electronically Signed   By: Keith Rake M.D.   On: 10/17/2018 22:16   Dg Knee 2 Views Left  Result Date: 10/17/2018 CLINICAL DATA:  85 year old with left knee pain.  Fall. Confusion. EXAM: LEFT KNEE - 1-2 VIEW COMPARISON:  None. FINDINGS: Left knee arthroplasty in expected alignment. No periprosthetic lucency or fracture. There is been patellar resurfacing. No significant joint effusion. Vascular calcifications. IMPRESSION: Left knee arthroplasty without complication or acute fracture. Electronically Signed   By: Keith Rake M.D.   On: 10/17/2018 22:17   Ct Head Wo Contrast  Result Date: 10/17/2018 CLINICAL DATA:  Found down, fall, confusion EXAM: CT HEAD WITHOUT CONTRAST CT CERVICAL SPINE WITHOUT CONTRAST TECHNIQUE: Multidetector CT imaging of the head and cervical spine was performed following the standard protocol without intravenous contrast. Multiplanar CT image reconstructions of the cervical spine were also generated. COMPARISON:  None. FINDINGS: CT HEAD FINDINGS Brain: No evidence of acute infarction, hemorrhage, hydrocephalus, extra-axial collection or mass lesion/mass effect. Periventricular white matter hypodensity. Vascular: No hyperdense vessel or unexpected calcification. Skull: Normal. Negative for fracture or focal lesion. Sinuses/Orbits: No acute finding. Other: None. CT CERVICAL SPINE FINDINGS Alignment: Normal. Skull base and vertebrae: No acute fracture. No primary bone lesion or focal pathologic process. Soft tissues and spinal canal: No prevertebral fluid or  swelling. No visible canal hematoma. Disc levels: Moderate multilevel disc space height loss and osteophytosis. Upper chest: Negative. Other: None. IMPRESSION: 1. No acute intracranial pathology. Small-vessel white matter disease. 2. No fracture or static subluxation of the cervical spine. Moderate multilevel  disc degenerative disease and osteophytosis. Electronically Signed   By: Eddie Candle M.D.   On: 10/17/2018 21:39   Ct Cervical Spine Wo Contrast  Result Date: 10/17/2018 CLINICAL DATA:  Found down, fall, confusion EXAM: CT HEAD WITHOUT CONTRAST CT CERVICAL SPINE WITHOUT CONTRAST TECHNIQUE: Multidetector CT imaging of the head and cervical spine was performed following the standard protocol without intravenous contrast. Multiplanar CT image reconstructions of the cervical spine were also generated. COMPARISON:  None. FINDINGS: CT HEAD FINDINGS Brain: No evidence of acute infarction, hemorrhage, hydrocephalus, extra-axial collection or mass lesion/mass effect. Periventricular white matter hypodensity. Vascular: No hyperdense vessel or unexpected calcification. Skull: Normal. Negative for fracture or focal lesion. Sinuses/Orbits: No acute finding. Other: None. CT CERVICAL SPINE FINDINGS Alignment: Normal. Skull base and vertebrae: No acute fracture. No primary bone lesion or focal pathologic process. Soft tissues and spinal canal: No prevertebral fluid or swelling. No visible canal hematoma. Disc levels: Moderate multilevel disc space height loss and osteophytosis. Upper chest: Negative. Other: None. IMPRESSION: 1. No acute intracranial pathology. Small-vessel white matter disease. 2. No fracture or static subluxation of the cervical spine. Moderate multilevel disc degenerative disease and osteophytosis. Electronically Signed   By: Eddie Candle M.D.   On: 10/17/2018 21:39   US Abdomen Limited  Result Date: 10/17/2018 CLINICAL DATA:  Right upper quadrant pain for 1 day, elevated LFTs EXAM: ULTRASOUND ABDOMEN LIMITED RIGHT UPPER QUADRANT COMPARISON:  None. FINDINGS: Gallbladder: Gallbladder is well distended with evidence of cholelithiasis. Negative sonographic Murphy's sign is noted. Common bile duct: Diameter: 5 mm. Liver: No focal lesion identified. Within normal limits in parenchymal echogenicity. Portal vein  is patent on color Doppler imaging with normal direction of blood flow towards the liver. IMPRESSION: Cholelithiasis without complicating factors. Electronically Signed   By: Inez Catalina M.D.   On: 10/17/2018 23:20   Dg Humerus Right  Result Date: 10/17/2018 CLINICAL DATA:  85 year old with right shoulder bruising. Fall. Confusion. EXAM: RIGHT HUMERUS - 2+ VIEW COMPARISON:  None. FINDINGS: Cortical margins of the humerus are intact. There is no evidence of fracture or other focal bone lesions. Scattered soft tissue calcifications. Soft tissue fullness of the upper arm may be related to underlying bruising. IMPRESSION: No fracture of the right humerus. Electronically Signed   By: Keith Rake M.D.   On: 10/17/2018 22:19    Pending Labs Unresulted Labs (From admission, onward)    Start     Ordered   10/17/18 2349  SARS Coronavirus 2 (CEPHEID - Performed in [facility] hospital lab), Hosp Order  (Asymptomatic Patients Labs)  Once,   STAT    Question:  Rule Out  Answer:  Yes   10/17/18 2348   10/17/18 2310  CK  Add-on,   AD     10/17/18 2309          Vitals/Pain Today's Vitals   10/17/18 2345 10/18/18 0000 10/18/18 0015 10/18/18 0030  BP: (!) 181/85 (!) 175/77 (!) 181/99 (!) 143/56  Pulse:      Resp: 20 (!) 23 20 17   SpO2:      PainSc:        Isolation Precautions No active isolations  Medications Medications  sodium chloride 0.9 % bolus 1,000 mL (has no administration  in time range)  0.9 %  sodium chloride infusion (has no administration in time range)    Mobility walks with device High fall risk   Focused Assessments Cardiac Assessment Handoff:  Cardiac Rhythm: Atrial fibrillation Lab Results  Component Value Date   TROPONINI <0.03 10/02/2018   No results found for: DDIMER Does the Patient currently have chest pain? No     R Recommendations: See Admitting Provider Note  Report given to:   Additional Notes: .

## 2018-10-18 NOTE — Progress Notes (Signed)
[facility] for Heparin Indication: atrial fibrillation  Allergies  Allergen Reactions  . Arimidex [Anastrozole] Other (See Comments)    Neck cramping  . Fish Allergy Other (See Comments)    Patient prefers to NOT eat this  . Morphine And Related Nausea And Vomiting  . Shellfish Allergy Other (See Comments)    Patient prefers to NOT eat this  . Vicodin [Hydrocodone-Acetaminophen] Nausea Only    DOES tolerate Vicodin/Norco, however    Patient Measurements: Weight: 169 lb 8.5 oz (76.9 kg)  Vital Signs: Temp: 98.2 F (36.8 C) (07/07 1649) Temp Source: Oral (07/07 1649) BP: 135/83 (07/07 1649) Pulse Rate: 107 (07/07 1649)  Labs: Recent Labs    10/17/18 2100 10/17/18 2250 10/17/18 2315 10/18/18 0503 10/18/18 0614 10/18/18 1549  HGB 16.4*  --   --  14.6  --   --   HCT 49.1*  --   --  43.4  --   --   PLT 261  --   --  254  --   --   APTT  --   --   --  24  --  45*  LABPROT  --   --   --  14.3  --   --   INR  --   --   --  1.1  --   --   HEPARINUNFRC  --   --   --  0.28*  --  0.42  CREATININE 0.83 0.78  --  0.76  --   --   CKTOTAL  --   --  1,449* 1,114*  --   --   TROPONINIHS  --   --   --  19* 22*  --     Estimated Creatinine Clearance: 47 mL/min (by C-G formula based on SCr of 0.76 mg/dL).   Medical History: Past Medical History:  Diagnosis Date  . Arthritis    low spine  . Breast cancer [facility]) 2013   right breast  . Breast cancer, right breast, recurrent 06/03/2011  . Cancer [facility]) 1989   breast right  . Chronic back pain   . Hypertension     Medications:  Medications Prior to Admission  Medication Sig Dispense Refill Last Dose  . acetaminophen (TYLENOL) 500 MG tablet Take 500-1,000 mg by mouth every 8 (eight) hours as needed (for arthritis pain).   unk at [employer]  . atorvastatin (LIPITOR) 20 MG tablet TAKE 1 TABLET BY MOUTH EVERY DAY (Patient taking differently: Take 20 mg by mouth daily. ) 90 tablet 0 10/15/2018 at Unknown  time  . calcium carbonate (OS-CAL) 600 MG TABS Take 600 mg by mouth daily.   Past Week at Unknown time  . Cholecalciferol (VITAMIN D3) 1000 UNITS CAPS Take 1,000 Units by mouth daily.    Past Week at Unknown time  . diltiazem (CARDIZEM CD) 120 MG 24 hr capsule TAKE 1 CAPSULE (120 MG TOTAL) BY MOUTH DAILY. PLEASE MAKE OVERDUE APPT (Patient taking differently: Take 120 mg by mouth daily. ) 30 capsule 0 10/15/2018 at Unknown time  . ELIQUIS 5 MG TABS tablet TAKE 1 TABLET BY MOUTH TWICE A DAY (Patient taking differently: Take 5 mg by mouth 2 (two) times daily. ) 60 tablet 5 10/15/2018 at 1900  . gabapentin (NEURONTIN) 100 MG capsule Take 100 mg by mouth at bedtime.    10/15/2018 at pm  . HYDROcodone-acetaminophen (NORCO/VICODIN) 5-325 MG tablet Take 1 tablet by mouth every 8 (eight) hours as needed for  pain.  0 unk at unk  . losartan (COZAAR) 25 MG tablet TAKE 2 TABLETS BY MOUTH (50MG  TOTAL) EVERY DAY PT NEEDS OFFICE VISIT (Patient taking differently: Take 25 mg by mouth every morning. ) 30 tablet 0 10/15/2018 at Unknown time  . Multiple Vitamins-Minerals (CENTRUM SILVER 50+WOMEN PO) Take 1 tablet by mouth daily with breakfast.   Past Week at Unknown time  . nitroGLYCERIN (NITROSTAT) 0.4 MG SL tablet Place 1 tablet (0.4 mg total) under the tongue every 5 (five) minutes x 3 doses as needed for chest pain. 25 tablet 2 unk at unk  . predniSONE (DELTASONE) 2.5 MG tablet Take 2.5 mg by mouth daily.  0 10/15/2018 at Unknown time  . RABEprazole (ACIPHEX) 20 MG tablet TAKE 1 TABLET BY MOUTH EVERY DAY (Patient taking differently: Take 20 mg by mouth at bedtime. ) 90 tablet 0 10/15/2018 at pm  . sertraline (ZOLOFT) 50 MG tablet Take 50 mg by mouth every morning.   3 10/15/2018 at Unknown time  . vitamin C (ASCORBIC ACID) 500 MG tablet Take 500 mg by mouth daily.   Past Week at Unknown time  . VITAMIN E PO Take 1,000 Units by mouth daily.    Past Week at Unknown time    Assessment: 85 y.o. female admitted s/p fall, h/o Afib  and Eliquis on hold (LD 7/4), for heparin.   HL 0.42, aPTT is subtherapeutic at 45 - HL still likely influenced by apixaban, no signs of bleeding or infusion issues, per nursing.    Goal of Therapy:  APTT 66-102 Heparin level 0.3-0.7 units/ml Monitor platelets by anticoagulation protocol: Yes   Plan:  Increase heparin infusion to 1200 units/hour Monitor HL and aPTT in 8 hours Monitor daily aPTT, HL, H&H, and platelets   Thank you,   Eddie Candle, PharmD PGY-1 Pharmacy Resident

## 2018-10-18 NOTE — Progress Notes (Signed)
PROGRESS NOTE    [name]  PJK:[id number] DOB: 1925-12-17 DOA: 10/17/2018 PCP: Mayra Neer, MD   Brief Narrative:  85 year old with history of atrial fibrillation, breast cancer in remission, chronic pain, essential hypertension brought to the hospital after found on the floor.  Patient was apparently conscious but confused.  She was found to be in mild rhabdomyolysis, slightly elevated LFTs, atrial fibrillation with RVR.  Trauma work-up including CT the head was unremarkable.  No evidence of infection was noted.   Assessment & Plan:   Principal Problem:   Acute encephalopathy Active Problems:   Personal history of malignant neoplasm of breast   Chronic anticoagulation   Essential hypertension   Atrial fibrillation, chronic   Gallstones  Acute metabolic encephalopathy with generalized weakness Fall with mild rhabdomyolysis -Unclear etiology at this time.  Ammonia levels negative.  CT of the head negative. -Trend CK levels, gentle hydration -Statin on hold - MRI brain-neg for acute stroke -EEG-pending -Check TSH, B12 and folate -PT/OT -Check procalcitonin, mild leukocytosis but no evidence of infection.  Atrial fibrillation with RVR - Give 1 more dose of 10 mg IV Cardizem, resume p.o. home medication.  Monitor electrolytes. Lopressor 5mg  IV. Will consult Cardiology. -On Eliquis at home, currently on heparin drip.  Mild transaminitis - HIDA scan- normal  Hyperlipidemia -Statin on hold due to rhabdomyolysis.  Osteoarthritis -On prednisone at home?  Chronic low back pain -On hydrocodone and gabapentin.  History of depression -On Zoloft.  DVT prophylaxis: Heparin drip Code Status: Full code Family Communication: Daughter at bedside Disposition Plan: Maintain hospital stay for better heart rate control and evaluation of her mentation  Consultants:   Cardiology  Procedures:   None  Antimicrobials:   None   Subjective: Patient received IV Ativan  prior to getting MRI therefore slightly drowsy during my evaluation but no focal neuro deficits noted.  She is easily arousable.  Had an extensive discussion with the patient's daughter at bedside who tells me that patient has had progressive memory loss and some signs of dementia over past several weeks.  Review of Systems Otherwise negative except as per HPI, including: General: Denies fever, chills, night sweats or unintended weight loss. Resp: Denies cough, wheezing, shortness of breath. Cardiac: Denies chest pain, palpitations, orthopnea, paroxysmal nocturnal dyspnea. GI: Denies abdominal pain, nausea, vomiting, diarrhea or constipation GU: Denies dysuria, frequency, hesitancy or incontinence MS: Denies muscle aches, joint pain or swelling Neuro: Denies headache, neurologic deficits (focal weakness, numbness, tingling), abnormal gait Psych: Denies anxiety, depression, SI/HI/AVH Skin: Denies new rashes or lesions ID: Denies sick contacts, exotic exposures, travel  Objective: Vitals:   10/18/18 0347 10/18/18 0545 10/18/18 0635 10/18/18 0743  BP: (!) 183/82 (!) 181/110 (!) 159/79 (!) 156/81  Pulse: 89 (!) 155 (!) 140 69  Resp: 16   15  Temp: (!) 97.5 F (36.4 C)   98.9 F (37.2 C)  TempSrc: Oral   Oral  SpO2: 97% 96% 97% 96%  Weight: 76.9 kg      No intake or output data in the 24 hours ending 10/18/18 0813 Filed Weights   10/18/18 0347  Weight: 76.9 kg    Examination:  General exam: Comfortable, drowsy.  Arousable to her name. Respiratory system: Clear to auscultation. Respiratory effort normal. Cardiovascular system: S1 & S2 heard, RRR. No JVD, murmurs, rubs, gallops or clicks. No pedal edema. Gastrointestinal system: Abdomen is nondistended, soft and nontender. No organomegaly or masses felt. Normal bowel sounds heard. Central nervous system: Alert and  oriented to her name. No focal neurological deficits. Extremities: Symmetric 4 x 5 power. Skin: No rashes, lesions or  ulcers Psychiatry: Alert to her name.  Overall poor judgment.    Data Reviewed:   CBC: Recent Labs  Lab 10/17/18 2100 10/18/18 0503  WBC 15.1* 15.8*  NEUTROABS 12.5* 13.6*  HGB 16.4* 14.6  HCT 49.1* 43.4  MCV 93.3 94.6  PLT 261 856   Basic Metabolic Panel: Recent Labs  Lab 10/17/18 2100 10/17/18 2250 10/18/18 0503  NA 138 142 141  K 6.0* 3.5 3.3*  CL 101 102 104  CO2 24 26 23   GLUCOSE 156* 161* 204*  BUN 17 17 18   CREATININE 0.83 0.78 0.76  CALCIUM 9.3 9.3 9.0  MG  --   --  2.0   GFR: Estimated Creatinine Clearance: 47 mL/min (by C-G formula based on SCr of 0.76 mg/dL). Liver Function Tests: Recent Labs  Lab 10/17/18 2100 10/18/18 0614  AST 112* 56*  ALT 35 38  ALKPHOS 60 60  BILITOT 2.9* 1.3*  PROT 7.2 6.7  ALBUMIN 3.9 3.5   No results for input(s): LIPASE, AMYLASE in the last 168 hours. Recent Labs  Lab 10/18/18 0503  AMMONIA 25   Coagulation Profile: Recent Labs  Lab 10/18/18 0503  INR 1.1   Cardiac Enzymes: Recent Labs  Lab 10/17/18 2315 10/18/18 0503  CKTOTAL 1,449* 1,114*   BNP (last 3 results) No results for input(s): PROBNP in the last 8760 hours. HbA1C: No results for input(s): HGBA1C in the last 72 hours. CBG: Recent Labs  Lab 10/17/18 2037  GLUCAP 156*   Lipid Profile: No results for input(s): CHOL, HDL, LDLCALC, TRIG, CHOLHDL, LDLDIRECT in the last 72 hours. Thyroid Function Tests: No results for input(s): TSH, T4TOTAL, FREET4, T3FREE, THYROIDAB in the last 72 hours. Anemia Panel: No results for input(s): VITAMINB12, FOLATE, FERRITIN, TIBC, IRON, RETICCTPCT in the last 72 hours. Sepsis Labs: No results for input(s): PROCALCITON, LATICACIDVEN in the last 168 hours.  Recent Results (from the past 240 hour(s))  SARS Coronavirus 2 (CEPHEID - Performed in [place] hospital lab), Hosp Order     Status: None   Collection Time: 10/18/18  1:04 AM   Specimen: Nasopharyngeal Swab  Result Value Ref Range Status   SARS  Coronavirus 2 NEGATIVE NEGATIVE Final    Comment: (NOTE) If result is NEGATIVE SARS-CoV-2 target nucleic acids are NOT DETECTED. The SARS-CoV-2 RNA is generally detectable in upper and lower  respiratory specimens during the acute phase of infection. The lowest  concentration of SARS-CoV-2 viral copies this assay can detect is 250  copies / mL. A negative result does not preclude SARS-CoV-2 infection  and should not be used as the sole basis for treatment or other  patient management decisions.  A negative result may occur with  improper specimen collection / handling, submission of specimen other  than nasopharyngeal swab, presence of viral mutation(s) within the  areas targeted by this assay, and inadequate number of viral copies  (<250 copies / mL). A negative result must be combined with clinical  observations, patient history, and epidemiological information. If result is POSITIVE SARS-CoV-2 target nucleic acids are DETECTED. The SARS-CoV-2 RNA is generally detectable in upper and lower  respiratory specimens dur ing the acute phase of infection.  Positive  results are indicative of active infection with SARS-CoV-2.  Clinical  correlation with patient history and other diagnostic information is  necessary to determine patient infection status.  Positive results do  not  rule out bacterial infection or co-infection with other viruses. If result is PRESUMPTIVE POSTIVE SARS-CoV-2 nucleic acids MAY BE PRESENT.   A presumptive positive result was obtained on the submitted specimen  and confirmed on repeat testing.  While 2019 novel coronavirus  (SARS-CoV-2) nucleic acids may be present in the submitted sample  additional confirmatory testing may be necessary for epidemiological  and / or clinical management purposes  to differentiate between  SARS-CoV-2 and other Sarbecovirus currently known to infect humans.  If clinically indicated additional testing with an alternate test    methodology [phone]) is advised. The SARS-CoV-2 RNA is generally  detectable in upper and lower respiratory sp ecimens during the acute  phase of infection. The expected result is Negative. Fact Sheet for Patients:  StrictlyIdeas.no Fact Sheet for Healthcare Providers: BankingDealers.co.za This test is not yet approved or cleared by the [place] FDA and has been authorized for detection and/or diagnosis of SARS-CoV-2 by FDA under an Emergency Use Authorization (EUA).  This EUA will remain in effect (meaning this test can be used) for the duration of the COVID-19 declaration under Section 564(b)(1) of the Act, 21 U.S.C. section 360bbb-3(b)(1), unless the authorization is terminated or revoked sooner. Performed at [facility], [place] [address], [place], [place] [address]          Radiology Studies: Dg Chest 2 View  Result Date: 10/17/2018 CLINICAL DATA:  85 year old with right shoulder bruising. Fall. Confusion. EXAM: CHEST - 2 VIEW COMPARISON:  Radiograph 10/02/2018 FINDINGS: The cardiomediastinal contours are unchanged. Moderate retrocardiac hiatal hernia with air-fluid level. Pulmonary vasculature is normal. No consolidation, pleural effusion, or pneumothorax. No acute osseous abnormalities are seen. Surgical clips in the right axilla. IMPRESSION: 1. No acute chest findings. 2. Moderate hiatal hernia. Electronically Signed   By: Keith Rake M.D.   On: 10/17/2018 22:17   Dg Pelvis 1-2 Views  Result Date: 10/17/2018 CLINICAL DATA:  85 year old with right hip bruising. Fall. Confusion. EXAM: PELVIS - 1-2 VIEW COMPARISON:  None. FINDINGS: The cortical margins of the bony pelvis are intact. No fracture. Pubic symphysis and sacroiliac joints are congruent. Both femoral heads are well-seated in the respective acetabula. Degenerative change of the pubic symphysis. Stool ball distends the rectum. IMPRESSION: No pelvic fracture.  Electronically Signed   By: Keith Rake M.D.   On: 10/17/2018 22:18   Dg Shoulder Right  Result Date: 10/17/2018 CLINICAL DATA:  85 year old with right shoulder bruising. Fall. Confusion. EXAM: RIGHT SHOULDER - 2+ VIEW COMPARISON:  None. FINDINGS: There is no evidence of fracture or dislocation. There is no evidence of arthropathy or other focal bone abnormality. Mild soft tissue prominence in the supraclavicular region may be soft tissue overlap 4 hematoma/bruising. IMPRESSION: No fracture or dislocation of the right shoulder. Electronically Signed   By: Keith Rake M.D.   On: 10/17/2018 22:16   Dg Knee 2 Views Left  Result Date: 10/17/2018 CLINICAL DATA:  85 year old with left knee pain.  Fall. Confusion. EXAM: LEFT KNEE - 1-2 VIEW COMPARISON:  None. FINDINGS: Left knee arthroplasty in expected alignment. No periprosthetic lucency or fracture. There is been patellar resurfacing. No significant joint effusion. Vascular calcifications. IMPRESSION: Left knee arthroplasty without complication or acute fracture. Electronically Signed   By: Keith Rake M.D.   On: 10/17/2018 22:17   Ct Head Wo Contrast  Result Date: 10/17/2018 CLINICAL DATA:  Found down, fall, confusion EXAM: CT HEAD WITHOUT CONTRAST CT CERVICAL SPINE WITHOUT CONTRAST TECHNIQUE: Multidetector CT imaging of the  head and cervical spine was performed following the standard protocol without intravenous contrast. Multiplanar CT image reconstructions of the cervical spine were also generated. COMPARISON:  None. FINDINGS: CT HEAD FINDINGS Brain: No evidence of acute infarction, hemorrhage, hydrocephalus, extra-axial collection or mass lesion/mass effect. Periventricular white matter hypodensity. Vascular: No hyperdense vessel or unexpected calcification. Skull: Normal. Negative for fracture or focal lesion. Sinuses/Orbits: No acute finding. Other: None. CT CERVICAL SPINE FINDINGS Alignment: Normal. Skull base and vertebrae: No acute  fracture. No primary bone lesion or focal pathologic process. Soft tissues and spinal canal: No prevertebral fluid or swelling. No visible canal hematoma. Disc levels: Moderate multilevel disc space height loss and osteophytosis. Upper chest: Negative. Other: None. IMPRESSION: 1. No acute intracranial pathology. Small-vessel white matter disease. 2. No fracture or static subluxation of the cervical spine. Moderate multilevel disc degenerative disease and osteophytosis. Electronically Signed   By: Eddie Candle M.D.   On: 10/17/2018 21:39   Ct Cervical Spine Wo Contrast  Result Date: 10/17/2018 CLINICAL DATA:  Found down, fall, confusion EXAM: CT HEAD WITHOUT CONTRAST CT CERVICAL SPINE WITHOUT CONTRAST TECHNIQUE: Multidetector CT imaging of the head and cervical spine was performed following the standard protocol without intravenous contrast. Multiplanar CT image reconstructions of the cervical spine were also generated. COMPARISON:  None. FINDINGS: CT HEAD FINDINGS Brain: No evidence of acute infarction, hemorrhage, hydrocephalus, extra-axial collection or mass lesion/mass effect. Periventricular white matter hypodensity. Vascular: No hyperdense vessel or unexpected calcification. Skull: Normal. Negative for fracture or focal lesion. Sinuses/Orbits: No acute finding. Other: None. CT CERVICAL SPINE FINDINGS Alignment: Normal. Skull base and vertebrae: No acute fracture. No primary bone lesion or focal pathologic process. Soft tissues and spinal canal: No prevertebral fluid or swelling. No visible canal hematoma. Disc levels: Moderate multilevel disc space height loss and osteophytosis. Upper chest: Negative. Other: None. IMPRESSION: 1. No acute intracranial pathology. Small-vessel white matter disease. 2. No fracture or static subluxation of the cervical spine. Moderate multilevel disc degenerative disease and osteophytosis. Electronically Signed   By: Eddie Candle M.D.   On: 10/17/2018 21:39   US Abdomen  Limited  Result Date: 10/17/2018 CLINICAL DATA:  Right upper quadrant pain for 1 day, elevated LFTs EXAM: ULTRASOUND ABDOMEN LIMITED RIGHT UPPER QUADRANT COMPARISON:  None. FINDINGS: Gallbladder: Gallbladder is well distended with evidence of cholelithiasis. Negative sonographic Murphy's sign is noted. Common bile duct: Diameter: 5 mm. Liver: No focal lesion identified. Within normal limits in parenchymal echogenicity. Portal vein is patent on color Doppler imaging with normal direction of blood flow towards the liver. IMPRESSION: Cholelithiasis without complicating factors. Electronically Signed   By: Inez Catalina M.D.   On: 10/17/2018 23:20   Dg Humerus Right  Result Date: 10/17/2018 CLINICAL DATA:  85 year old with right shoulder bruising. Fall. Confusion. EXAM: RIGHT HUMERUS - 2+ VIEW COMPARISON:  None. FINDINGS: Cortical margins of the humerus are intact. There is no evidence of fracture or other focal bone lesions. Scattered soft tissue calcifications. Soft tissue fullness of the upper arm may be related to underlying bruising. IMPRESSION: No fracture of the right humerus. Electronically Signed   By: Keith Rake M.D.   On: 10/17/2018 22:19        Scheduled Meds:  calcium carbonate  1,250 mg Oral Q breakfast   diltiazem  120 mg Oral Daily   diltiazem  10 mg Intravenous Once   gabapentin  100 mg Oral QHS   losartan  25 mg Oral q morning - 10a   pantoprazole  40 mg Oral Daily   predniSONE  2.5 mg Oral Q breakfast   sertraline  50 mg Oral q morning - 10a   vitamin C  500 mg Oral Daily   Continuous Infusions:  sodium chloride 75 mL/hr at 10/18/18 0515   heparin 1,000 Units/hr (10/18/18 0615)     LOS: 0 days   Time spent= 35 mins    Ramiz Turpin Arsenio Loader, MD [facility]  If 7PM-7AM, please contact night-coverage [email or web address] 10/18/2018, 8:13 AM

## 2018-10-19 DIAGNOSIS — I480 Paroxysmal atrial fibrillation: Secondary | ICD-10-CM

## 2018-10-19 DIAGNOSIS — Z7901 Long term (current) use of anticoagulants: Secondary | ICD-10-CM

## 2018-10-19 DIAGNOSIS — G9340 Encephalopathy, unspecified: Secondary | ICD-10-CM | POA: Diagnosis not present

## 2018-10-19 LAB — FOLATE RBC
Folate, Hemolysate: >620 ng/mL
Folate, RBC: >1473 ng/mL (ref >498)
Hematocrit: 42.1 % (ref 34.0–46.6)

## 2018-10-19 LAB — CBC
HCT: 40.9 % (ref 36.0–46.0)
Hemoglobin: 13.8 g/dL (ref 12.0–15.0)
MCH: 31.3 pg (ref 26.0–34.0)
MCHC: 33.7 g/dL (ref 30.0–36.0)
MCV: 92.7 fL (ref 80.0–100.0)
Platelets: 241 10*3/uL (ref 150–400)
RBC: 4.41 MIL/uL (ref 3.87–5.11)
RDW: 12.8 % (ref 11.5–15.5)
WBC: 17.7 10*3/uL — ABNORMAL HIGH (ref 4.0–10.5)
nRBC: 0 % (ref 0.0–0.2)

## 2018-10-19 LAB — COMPREHENSIVE METABOLIC PANEL
ALT: 36 U/L (ref 0–44)
AST: 39 U/L (ref 15–41)
Albumin: 3.2 g/dL — ABNORMAL LOW (ref 3.5–5.0)
Alkaline Phosphatase: 53 U/L (ref 38–126)
Anion gap: 12 (ref 5–15)
BUN: 11 mg/dL (ref 8–23)
CO2: 21 mmol/L — ABNORMAL LOW (ref 22–32)
Calcium: 8.7 mg/dL — ABNORMAL LOW (ref 8.9–10.3)
Chloride: 103 mmol/L (ref 98–111)
Creatinine, Ser: 0.71 mg/dL (ref 0.44–1.00)
GFR calc Af Amer: >60 mL/min (ref >60)
GFR calc non Af Amer: >60 mL/min (ref >60)
Glucose, Bld: 203 mg/dL — ABNORMAL HIGH (ref 70–99)
Potassium: 3.5 mmol/L (ref 3.5–5.1)
Sodium: 136 mmol/L (ref 135–145)
Total Bilirubin: 1.1 mg/dL (ref 0.3–1.2)
Total Protein: 6.3 g/dL — ABNORMAL LOW (ref 6.5–8.1)

## 2018-10-19 LAB — APTT: aPTT: 50 seconds — ABNORMAL HIGH (ref 24–36)

## 2018-10-19 LAB — MAGNESIUM: Magnesium: 1.9 mg/dL (ref 1.7–2.4)

## 2018-10-19 LAB — HEPARIN LEVEL (UNFRACTIONATED): Heparin Unfractionated: 0.51 IU/mL (ref 0.30–0.70)

## 2018-10-19 MED ORDER — DILTIAZEM HCL ER COATED BEADS 180 MG PO CP24
180.0000 mg | ORAL_CAPSULE | Freq: Every day | ORAL | Status: DC
  Administered 2018-10-19: 180 mg via ORAL
  Filled 2018-10-19: qty 1

## 2018-10-19 MED ORDER — ACETAMINOPHEN 325 MG PO TABS
650.0000 mg | ORAL_TABLET | Freq: Four times a day (QID) | ORAL | Status: DC | PRN
  Administered 2018-10-19: 650 mg via ORAL
  Filled 2018-10-19 (×3): qty 2

## 2018-10-19 MED ORDER — DILTIAZEM HCL ER COATED BEADS 180 MG PO CP24: 180.0000 mg | ORAL_CAPSULE | Freq: Every day | ORAL | 1 refills | Status: DC

## 2018-10-19 MED ORDER — APIXABAN 5 MG PO TABS
5.0000 mg | ORAL_TABLET | Freq: Two times a day (BID) | ORAL | Status: DC
  Administered 2018-10-19: 5 mg via ORAL
  Filled 2018-10-19 (×2): qty 1

## 2018-10-19 NOTE — Discharge Summary (Signed)
Physician Discharge Summary  [name]:[id number] DOB: 10/27/1925 DOA: 10/17/2018  PCP: Mayra Neer, MD  Admit date: 10/17/2018 Discharge date: 10/19/2018  Admitted From: Home Disposition:  Home with [facility]  Recommendations for Outpatient Follow-up:  1. Follow up with PCP in 1-2 weeks 2. Please obtain BMP/CBC in one week your next doctors visit.  3. Cardizem increased 280 mg daily from 120 mg daily  Home Health: PT/OT/RN/SW Equipment/Devices: None Discharge Condition: Stable CODE STATUS: Full  Diet recommendation: Cardiac  Brief/Interim Summary: 85 year old with history of atrial fibrillation, breast cancer in remission, chronic pain, essential hypertension brought to the hospital after found on the floor.  Patient was apparently conscious but confused.  She was found to be in mild rhabdomyolysis, slightly elevated LFTs, atrial fibrillation with RVR.  Trauma work-up including CT the head was unremarkable.  No evidence of infection was noted.  There was some leukocytosis but it was suspected secondary to prednisone use.  MRI of the brain was negative for any acute CVA.  No obvious evidence of infection was noted.  TSH, B12 within normal limits.  Procalcitonin level was negative. It was thought her encephalopathy and weakness secondary to dehydration leading to fall causing mild rhabdomyolysis.  This stress also induce atrial fibrillation with RVR for which cardiology was consulted.  Cardizem was increased from 120 mg 280 mg daily. EEG was also performed which was negative for any acute seizure activity but there was a foci in the frontal lobe which could be potential source in the future. Physical therapy recommended home health.  Arrangements made.  Discharge today.   Discharge Diagnoses:  Principal Problem:   Acute encephalopathy Active Problems:   Personal history of malignant neoplasm of breast   Chronic anticoagulation   Essential hypertension   Atrial fibrillation,  chronic   Gallstones  Acute metabolic encephalopathy with generalized weakness Fall with mild rhabdomyolysis, resolved -Likely from dehydration e.  Ammonia levels negative.  CT of the head negative.  MRI brain negative -Trend CK levels, gentle hydration -Statin on hold - MRI brain-neg for acute stroke -EEG- no seizure-like activity but there is a focal point in the frontal lobe which could be potential source of the future. -TSH B12 and folate within normal limits -PT/OT-home health arranged -Mild leukocytosis but procalcitonin level negative.  Suspect this is from prednisone use.  No need for antibiotic use.  Atrial fibrillation with RVR, resolved -  Patient is now back in sinus rhythm.  Will increase Cardizem from 120 mg 280 mg daily.  Appreciate input from cardiology -Heparin drip transition to Eliquis at discharge  Mild transaminitis - HIDA scan- normal  Hyperlipidemia -Resume home meds  Osteoarthritis -On prednisone at home  Chronic low back pain -On hydrocodone and gabapentin.  History of depression -On Zoloft.   Consultations:  Cardiology  Subjective: Feels better, no complaints.  Patient had mild headache this morning for which she was given Norco which made her slightly drowsy.  She is easily arousable.  Discharge Exam: Vitals:   10/19/18 0408 10/19/18 0810  BP: (!) 151/69 (!) 175/76  Pulse: 70 72  Resp: 18 18  Temp: 98.4 F (36.9 C) 98.2 F (36.8 C)  SpO2: 93% 97%   Vitals:   10/18/18 1951 10/19/18 0025 10/19/18 0408 10/19/18 0810  BP: (!) 149/73 (!) 154/45 (!) 151/69 (!) 175/76  Pulse: 79 77 70 72  Resp: 18 18 18 18   Temp: 98.6 F (37 C) 98.5 F (36.9 C) 98.4 F (36.9 C) 98.2 F (  36.8 C)  TempSrc: Oral Oral Oral Oral  SpO2: 94% 95% 93% 97%  Weight:        General: Slightly drowsy from receiving Norco for her headache but easily arousable.  Elderly frail. Cardiovascular: RRR, S1/S2 +, no rubs, no gallops Respiratory: CTA  bilaterally, no wheezing, no rhonchi Abdominal: Soft, NT, ND, bowel sounds + Extremities: no edema, no cyanosis  Discharge Instructions  Discharge Instructions    Call MD for:  difficulty breathing, headache or visual disturbances   Complete by: As directed    Call MD for:  persistant dizziness or light-headedness   Complete by: As directed    Call MD for:  redness, tenderness, or signs of infection (pain, swelling, redness, odor or green/yellow discharge around incision site)   Complete by: As directed    Diet - low sodium heart healthy   Complete by: As directed    Diet - low sodium heart healthy   Complete by: As directed    Increase activity slowly   Complete by: As directed    Increase activity slowly   Complete by: As directed      Allergies as of 10/19/2018      Reactions   Arimidex [anastrozole] Other (See Comments)   Neck cramping   Fish Allergy Other (See Comments)   Patient prefers to NOT eat this   Morphine And Related Nausea And Vomiting   Shellfish Allergy Other (See Comments)   Patient prefers to NOT eat this   Vicodin [hydrocodone-acetaminophen] Nausea Only   DOES tolerate Vicodin/Norco, however      Medication List    TAKE these medications   acetaminophen 500 MG tablet Commonly known as: TYLENOL Take 500-1,000 mg by mouth every 8 (eight) hours as needed (for arthritis pain).   atorvastatin 20 MG tablet Commonly known as: LIPITOR TAKE 1 TABLET BY MOUTH EVERY DAY   calcium carbonate 600 MG Tabs tablet Commonly known as: OS-CAL Take 600 mg by mouth daily.   CENTRUM SILVER 50+WOMEN PO Take 1 tablet by mouth daily with breakfast.   diltiazem 180 MG 24 hr capsule Commonly known as: CARDIZEM CD Take 1 capsule (180 mg total) by mouth daily. What changed:   medication strength  See the new instructions.   Eliquis 5 MG Tabs tablet Generic drug: apixaban TAKE 1 TABLET BY MOUTH TWICE A DAY What changed: how much to take   gabapentin 100 MG  capsule Commonly known as: NEURONTIN Take 100 mg by mouth at bedtime.   HYDROcodone-acetaminophen 5-325 MG tablet Commonly known as: NORCO/VICODIN Take 1 tablet by mouth every 8 (eight) hours as needed for pain.   losartan 25 MG tablet Commonly known as: COZAAR TAKE 2 TABLETS BY MOUTH (50MG  TOTAL) EVERY DAY PT NEEDS OFFICE VISIT What changed: See the new instructions.   nitroGLYCERIN 0.4 MG SL tablet Commonly known as: NITROSTAT Place 1 tablet (0.4 mg total) under the tongue every 5 (five) minutes x 3 doses as needed for chest pain.   predniSONE 2.5 MG tablet Commonly known as: DELTASONE Take 2.5 mg by mouth daily.   RABEprazole 20 MG tablet Commonly known as: ACIPHEX TAKE 1 TABLET BY MOUTH EVERY DAY What changed: when to take this   sertraline 50 MG tablet Commonly known as: ZOLOFT Take 50 mg by mouth every morning.   vitamin C 500 MG tablet Commonly known as: ASCORBIC ACID Take 500 mg by mouth daily.   Vitamin D3 25 MCG (1000 UT) Caps Take 1,000 Units by mouth  daily.   VITAMIN E PO Take 1,000 Units by mouth daily.      Follow-up Information    Mayra Neer, MD. Schedule an appointment as soon as possible for a visit in 1 week(s).   Specialty: Family Medicine Contact information: [address] [place] [address] [phone]        Sanda Klein, MD .   Specialty: Cardiology Contact information: [address] [place] [address] [phone]          Allergies  Allergen Reactions  . Arimidex [Anastrozole] Other (See Comments)    Neck cramping  . Fish Allergy Other (See Comments)    Patient prefers to NOT eat this  . Morphine And Related Nausea And Vomiting  . Shellfish Allergy Other (See Comments)    Patient prefers to NOT eat this  . Vicodin [Hydrocodone-Acetaminophen] Nausea Only    DOES tolerate Vicodin/Norco, however    You were cared for by a hospitalist during your hospital stay. If you have  any questions about your discharge medications or the care you received while you were in the hospital after you are discharged, you can call the unit and asked to speak with the hospitalist on call if the hospitalist that took care of you is not available. Once you are discharged, your primary care physician will handle any further medical issues. Please note that no refills for any discharge medications will be authorized once you are discharged, as it is imperative that you return to your primary care physician (or establish a relationship with a primary care physician if you do not have one) for your aftercare needs so that they can reassess your need for medications and monitor your lab values.   Procedures/Studies: Dg Chest 2 View  Result Date: 10/17/2018 CLINICAL DATA:  85 year old with right shoulder bruising. Fall. Confusion. EXAM: CHEST - 2 VIEW COMPARISON:  Radiograph 10/02/2018 FINDINGS: The cardiomediastinal contours are unchanged. Moderate retrocardiac hiatal hernia with air-fluid level. Pulmonary vasculature is normal. No consolidation, pleural effusion, or pneumothorax. No acute osseous abnormalities are seen. Surgical clips in the right axilla. IMPRESSION: 1. No acute chest findings. 2. Moderate hiatal hernia. Electronically Signed   By: Keith Rake M.D.   On: 10/17/2018 22:17   Dg Pelvis 1-2 Views  Result Date: 10/17/2018 CLINICAL DATA:  85 year old with right hip bruising. Fall. Confusion. EXAM: PELVIS - 1-2 VIEW COMPARISON:  None. FINDINGS: The cortical margins of the bony pelvis are intact. No fracture. Pubic symphysis and sacroiliac joints are congruent. Both femoral heads are well-seated in the respective acetabula. Degenerative change of the pubic symphysis. Stool ball distends the rectum. IMPRESSION: No pelvic fracture. Electronically Signed   By: Keith Rake M.D.   On: 10/17/2018 22:18   Dg Shoulder Right  Result Date: 10/17/2018 CLINICAL DATA:  85 year old with right  shoulder bruising. Fall. Confusion. EXAM: RIGHT SHOULDER - 2+ VIEW COMPARISON:  None. FINDINGS: There is no evidence of fracture or dislocation. There is no evidence of arthropathy or other focal bone abnormality. Mild soft tissue prominence in the supraclavicular region may be soft tissue overlap 4 hematoma/bruising. IMPRESSION: No fracture or dislocation of the right shoulder. Electronically Signed   By: Keith Rake M.D.   On: 10/17/2018 22:16   Dg Knee 2 Views Left  Result Date: 10/17/2018 CLINICAL DATA:  85 year old with left knee pain.  Fall. Confusion. EXAM: LEFT KNEE - 1-2 VIEW COMPARISON:  None. FINDINGS: Left knee arthroplasty in expected alignment. No periprosthetic lucency  or fracture. There is been patellar resurfacing. No significant joint effusion. Vascular calcifications. IMPRESSION: Left knee arthroplasty without complication or acute fracture. Electronically Signed   By: Keith Rake M.D.   On: 10/17/2018 22:17   Ct Head Wo Contrast  Result Date: 10/17/2018 CLINICAL DATA:  Found down, fall, confusion EXAM: CT HEAD WITHOUT CONTRAST CT CERVICAL SPINE WITHOUT CONTRAST TECHNIQUE: Multidetector CT imaging of the head and cervical spine was performed following the standard protocol without intravenous contrast. Multiplanar CT image reconstructions of the cervical spine were also generated. COMPARISON:  None. FINDINGS: CT HEAD FINDINGS Brain: No evidence of acute infarction, hemorrhage, hydrocephalus, extra-axial collection or mass lesion/mass effect. Periventricular white matter hypodensity. Vascular: No hyperdense vessel or unexpected calcification. Skull: Normal. Negative for fracture or focal lesion. Sinuses/Orbits: No acute finding. Other: None. CT CERVICAL SPINE FINDINGS Alignment: Normal. Skull base and vertebrae: No acute fracture. No primary bone lesion or focal pathologic process. Soft tissues and spinal canal: No prevertebral fluid or swelling. No visible canal hematoma. Disc  levels: Moderate multilevel disc space height loss and osteophytosis. Upper chest: Negative. Other: None. IMPRESSION: 1. No acute intracranial pathology. Small-vessel white matter disease. 2. No fracture or static subluxation of the cervical spine. Moderate multilevel disc degenerative disease and osteophytosis. Electronically Signed   By: Eddie Candle M.D.   On: 10/17/2018 21:39   Ct Cervical Spine Wo Contrast  Result Date: 10/17/2018 CLINICAL DATA:  Found down, fall, confusion EXAM: CT HEAD WITHOUT CONTRAST CT CERVICAL SPINE WITHOUT CONTRAST TECHNIQUE: Multidetector CT imaging of the head and cervical spine was performed following the standard protocol without intravenous contrast. Multiplanar CT image reconstructions of the cervical spine were also generated. COMPARISON:  None. FINDINGS: CT HEAD FINDINGS Brain: No evidence of acute infarction, hemorrhage, hydrocephalus, extra-axial collection or mass lesion/mass effect. Periventricular white matter hypodensity. Vascular: No hyperdense vessel or unexpected calcification. Skull: Normal. Negative for fracture or focal lesion. Sinuses/Orbits: No acute finding. Other: None. CT CERVICAL SPINE FINDINGS Alignment: Normal. Skull base and vertebrae: No acute fracture. No primary bone lesion or focal pathologic process. Soft tissues and spinal canal: No prevertebral fluid or swelling. No visible canal hematoma. Disc levels: Moderate multilevel disc space height loss and osteophytosis. Upper chest: Negative. Other: None. IMPRESSION: 1. No acute intracranial pathology. Small-vessel white matter disease. 2. No fracture or static subluxation of the cervical spine. Moderate multilevel disc degenerative disease and osteophytosis. Electronically Signed   By: Eddie Candle M.D.   On: 10/17/2018 21:39   Mr Brain Wo Contrast  Result Date: 10/18/2018 CLINICAL DATA:  Altered level of consciousness, unexplained EXAM: MRI HEAD WITHOUT CONTRAST TECHNIQUE: Multiplanar, multiecho pulse  sequences of the brain and surrounding structures were obtained without intravenous contrast. COMPARISON:  Head CT from yesterday FINDINGS: Brain: No acute infarction, hemorrhage, hydrocephalus, extra-axial collection or mass lesion. Moderate chronic small vessel ischemia in this cerebral white matter. Mild to moderate cerebral volume loss without specific pattern. Vascular: Major flow voids are preserved Skull and upper cervical spine: Negative for marrow lesion. Cervical spine degeneration as seen by CT yesterday. Sinuses/Orbits: Negative Other: Left more than right mastoid opacification with negative nasopharynx. On the more extensive left this is chronic based on sclerosis in 2012 comparison IMPRESSION: Senescent changes without acute or reversible finding. Electronically Signed   By: Monte Fantasia M.D.   On: 10/18/2018 12:16   Nm Hepato W/eject Fract  Result Date: 10/18/2018 CLINICAL DATA:  Abdominal tenderness and elevated liver enzymes EXAM: NUCLEAR MEDICINE HEPATOBILIARY IMAGING WITH GALLBLADDER  EF VIEWS: Anterior right upper quadrant RADIOPHARMACEUTICALS:  4.7 mCi Tc-34m  Choletec IV COMPARISON:  None. FINDINGS: Liver uptake of radiotracer is unremarkable. There is prompt visualization of gallbladder and small bowel, indicating patency of the cystic and common bile ducts. The patient consumed 8 ounces of Ensure Enlive with calculation of the computer generated ejection fraction of radiotracer from the gallbladder. The patient did not experience clinical symptoms with the oral Ensure consumption. The computer generated ejection fraction of radiotracer from the gallbladder is normal at 89%, normal greater than 33% using the oral agent. IMPRESSION: Study within normal limits. Electronically Signed   By: Lowella Grip III M.D.   On: 10/18/2018 11:06   US Abdomen Limited  Result Date: 10/17/2018 CLINICAL DATA:  Right upper quadrant pain for 1 day, elevated LFTs EXAM: ULTRASOUND ABDOMEN LIMITED RIGHT  UPPER QUADRANT COMPARISON:  None. FINDINGS: Gallbladder: Gallbladder is well distended with evidence of cholelithiasis. Negative sonographic Murphy's sign is noted. Common bile duct: Diameter: 5 mm. Liver: No focal lesion identified. Within normal limits in parenchymal echogenicity. Portal vein is patent on color Doppler imaging with normal direction of blood flow towards the liver. IMPRESSION: Cholelithiasis without complicating factors. Electronically Signed   By: Inez Catalina M.D.   On: 10/17/2018 23:20   Dg Chest Port 1 View  Result Date: 10/02/2018 CLINICAL DATA:  Chest pain centrally since this morning. EXAM: PORTABLE CHEST 1 VIEW COMPARISON:  02/13/2018 FINDINGS: Lungs are adequately inflated without consolidation or effusion. Known moderate size hiatal hernia. Cardiomediastinal silhouette and remainder of the exam is unchanged. IMPRESSION: No active disease. Moderate size hiatal hernia. Electronically Signed   By: Marin Olp M.D.   On: 10/02/2018 11:45   Dg Humerus Right  Result Date: 10/17/2018 CLINICAL DATA:  85 year old with right shoulder bruising. Fall. Confusion. EXAM: RIGHT HUMERUS - 2+ VIEW COMPARISON:  None. FINDINGS: Cortical margins of the humerus are intact. There is no evidence of fracture or other focal bone lesions. Scattered soft tissue calcifications. Soft tissue fullness of the upper arm may be related to underlying bruising. IMPRESSION: No fracture of the right humerus. Electronically Signed   By: Keith Rake M.D.   On: 10/17/2018 22:19     The results of significant diagnostics from this hospitalization (including imaging, microbiology, ancillary and laboratory) are listed below for reference.     Microbiology: Recent Results (from the past 240 hour(s))  SARS Coronavirus 2 (CEPHEID - Performed in [facility] hospital lab), Hosp Order     Status: None   Collection Time: 10/18/18  1:04 AM   Specimen: Nasopharyngeal Swab  Result Value Ref Range Status   SARS  Coronavirus 2 NEGATIVE NEGATIVE Final    Comment: (NOTE) If result is NEGATIVE SARS-CoV-2 target nucleic acids are NOT DETECTED. The SARS-CoV-2 RNA is generally detectable in upper and lower  respiratory specimens during the acute phase of infection. The lowest  concentration of SARS-CoV-2 viral copies this assay can detect is 250  copies / mL. A negative result does not preclude SARS-CoV-2 infection  and should not be used as the sole basis for treatment or other  patient management decisions.  A negative result may occur with  improper specimen collection / handling, submission of specimen other  than nasopharyngeal swab, presence of viral mutation(s) within the  areas targeted by this assay, and inadequate number of viral copies  (<250 copies / mL). A negative result must be combined with clinical  observations, patient history, and epidemiological information. If result is  POSITIVE SARS-CoV-2 target nucleic acids are DETECTED. The SARS-CoV-2 RNA is generally detectable in upper and lower  respiratory specimens dur ing the acute phase of infection.  Positive  results are indicative of active infection with SARS-CoV-2.  Clinical  correlation with patient history and other diagnostic information is  necessary to determine patient infection status.  Positive results do  not rule out bacterial infection or co-infection with other viruses. If result is PRESUMPTIVE POSTIVE SARS-CoV-2 nucleic acids MAY BE PRESENT.   A presumptive positive result was obtained on the submitted specimen  and confirmed on repeat testing.  While 2019 novel coronavirus  (SARS-CoV-2) nucleic acids may be present in the submitted sample  additional confirmatory testing may be necessary for epidemiological  and / or clinical management purposes  to differentiate between  SARS-CoV-2 and other Sarbecovirus currently known to infect humans.  If clinically indicated additional testing with an alternate test   methodology [phone]) is advised. The SARS-CoV-2 RNA is generally  detectable in upper and lower respiratory sp ecimens during the acute  phase of infection. The expected result is Negative. Fact Sheet for Patients:  StrictlyIdeas.no Fact Sheet for Healthcare Providers: BankingDealers.co.za This test is not yet approved or cleared by the [place] FDA and has been authorized for detection and/or diagnosis of SARS-CoV-2 by FDA under an Emergency Use Authorization (EUA).  This EUA will remain in effect (meaning this test can be used) for the duration of the COVID-19 declaration under Section 564(b)(1) of the Act, 21 U.S.C. section 360bbb-3(b)(1), unless the authorization is terminated or revoked sooner. Performed at [facility], [place] [address], [place], [place] [address]      Labs: BNP (last 3 results) No results for input(s): BNP in the last 8760 hours. Basic Metabolic Panel: Recent Labs  Lab 10/17/18 2100 10/17/18 2250 10/18/18 0503 10/19/18 0332  NA 138 142 141 136  K 6.0* 3.5 3.3* 3.5  CL 101 102 104 103  CO2 24 26 23  21*  GLUCOSE 156* 161* 204* 203*  BUN 17 17 18 11   CREATININE 0.83 0.78 0.76 0.71  CALCIUM 9.3 9.3 9.0 8.7*  MG  --   --  2.0 1.9   Liver Function Tests: Recent Labs  Lab 10/17/18 2100 10/18/18 0614 10/19/18 0332  AST 112* 56* 39  ALT 35 38 36  ALKPHOS 60 60 53  BILITOT 2.9* 1.3* 1.1  PROT 7.2 6.7 6.3*  ALBUMIN 3.9 3.5 3.2*   No results for input(s): LIPASE, AMYLASE in the last 168 hours. Recent Labs  Lab 10/18/18 0503  AMMONIA 25   CBC: Recent Labs  Lab 10/17/18 2100 10/18/18 0503 10/19/18 0332  WBC 15.1* 15.8* 17.7*  NEUTROABS 12.5* 13.6*  --   HGB 16.4* 14.6 13.8  HCT 49.1* 43.4 40.9  MCV 93.3 94.6 92.7  PLT 261 254 241   Cardiac Enzymes: Recent Labs  Lab 10/17/18 2315 10/18/18 0503  CKTOTAL 1,449* 1,114*   BNP: Invalid input(s): POCBNP CBG: Recent Labs  Lab  10/17/18 2037  GLUCAP 156*   D-Dimer No results for input(s): DDIMER in the last 72 hours. Hgb A1c No results for input(s): HGBA1C in the last 72 hours. Lipid Profile No results for input(s): CHOL, HDL, LDLCALC, TRIG, CHOLHDL, LDLDIRECT in the last 72 hours. Thyroid function studies Recent Labs    10/18/18 1423  TSH 2.888   Anemia work up Recent Labs    10/18/18 1423  VITAMINB12 207   Urinalysis    Component Value Date/Time  COLORURINE YELLOW 10/17/2018 [place] 10/17/2018 2258   LABSPEC 1.023 10/17/2018 2258   PHURINE 5.0 10/17/2018 2258   GLUCOSEU NEGATIVE 10/17/2018 2258   HGBUR NEGATIVE 10/17/2018 2258   BILIRUBINUR NEGATIVE 10/17/2018 2258   KETONESUR 20 (A) 10/17/2018 2258   PROTEINUR 100 (A) 10/17/2018 2258   UROBILINOGEN 0.2 08/02/2007 1310   NITRITE NEGATIVE 10/17/2018 2258   LEUKOCYTESUR NEGATIVE 10/17/2018 2258   Sepsis Labs Invalid input(s): PROCALCITONIN,  WBC,  LACTICIDVEN Microbiology Recent Results (from the past 240 hour(s))  SARS Coronavirus 2 (CEPHEID - Performed in [place] hospital lab), Hosp Order     Status: None   Collection Time: 10/18/18  1:04 AM   Specimen: Nasopharyngeal Swab  Result Value Ref Range Status   SARS Coronavirus 2 NEGATIVE NEGATIVE Final    Comment: (NOTE) If result is NEGATIVE SARS-CoV-2 target nucleic acids are NOT DETECTED. The SARS-CoV-2 RNA is generally detectable in upper and lower  respiratory specimens during the acute phase of infection. The lowest  concentration of SARS-CoV-2 viral copies this assay can detect is 250  copies / mL. A negative result does not preclude SARS-CoV-2 infection  and should not be used as the sole basis for treatment or other  patient management decisions.  A negative result may occur with  improper specimen collection / handling, submission of specimen other  than nasopharyngeal swab, presence of viral mutation(s) within the  areas targeted by this assay, and  inadequate number of viral copies  (<250 copies / mL). A negative result must be combined with clinical  observations, patient history, and epidemiological information. If result is POSITIVE SARS-CoV-2 target nucleic acids are DETECTED. The SARS-CoV-2 RNA is generally detectable in upper and lower  respiratory specimens dur ing the acute phase of infection.  Positive  results are indicative of active infection with SARS-CoV-2.  Clinical  correlation with patient history and other diagnostic information is  necessary to determine patient infection status.  Positive results do  not rule out bacterial infection or co-infection with other viruses. If result is PRESUMPTIVE POSTIVE SARS-CoV-2 nucleic acids MAY BE PRESENT.   A presumptive positive result was obtained on the submitted specimen  and confirmed on repeat testing.  While 2019 novel coronavirus  (SARS-CoV-2) nucleic acids may be present in the submitted sample  additional confirmatory testing may be necessary for epidemiological  and / or clinical management purposes  to differentiate between  SARS-CoV-2 and other Sarbecovirus currently known to infect humans.  If clinically indicated additional testing with an alternate test  methodology [phone]) is advised. The SARS-CoV-2 RNA is generally  detectable in upper and lower respiratory sp ecimens during the acute  phase of infection. The expected result is Negative. Fact Sheet for Patients:  StrictlyIdeas.no Fact Sheet for Healthcare Providers: BankingDealers.co.za This test is not yet approved or cleared by the [place] FDA and has been authorized for detection and/or diagnosis of SARS-CoV-2 by FDA under an Emergency Use Authorization (EUA).  This EUA will remain in effect (meaning this test can be used) for the duration of the COVID-19 declaration under Section 564(b)(1) of the Act, 21 U.S.C. section 360bbb-3(b)(1), unless the  authorization is terminated or revoked sooner. Performed at [facility], [place] [address], [place], [place] [address]      Time coordinating discharge:  I have spent 35 minutes face to face with the patient and on the ward discussing the patients care, assessment, plan and disposition with other care givers. >50%  of the time was devoted counseling the patient about the risks and benefits of treatment/Discharge disposition and coordinating care.   SIGNED:   Damita Lack, MD  [facility] 10/19/2018, 10:33 AM   If 7PM-7AM, please contact night-coverage [email or web address]

## 2018-10-19 NOTE — Progress Notes (Signed)
DAILY PROGRESS NOTE   Patient Name: [name] Date of Encounter: 10/19/2018 Cardiologist: Sanda Klein, MD  Chief Complaint   Confused, ringing in her ears  Patient Profile   [name] is a 85 y.o. female with a hx of PAF on A/C, hypertension, chronic back pain, breast cancer s/p R mastectomy and arthritis taking prednisone who is being seen today for the evaluation of atrial fibrillation with RVR at the request of Dr. Reesa Chew  Subjective   Converted back to NSR overnight. Still on heparin. BP elevated.  Objective   Vitals:   10/18/18 1951 10/19/18 0025 10/19/18 0408 10/19/18 0810  BP: (!) 149/73 (!) 154/45 (!) 151/69 (!) 175/76  Pulse: 79 77 70 72  Resp: 18 18 18 18   Temp: 98.6 F (37 C) 98.5 F (36.9 C) 98.4 F (36.9 C) 98.2 F (36.8 C)  TempSrc: Oral Oral Oral Oral  SpO2: 94% 95% 93% 97%  Weight:        Intake/Output Summary (Last 24 hours) at 10/19/2018 1014 Last data filed at 10/19/2018 0410 Gross per 24 hour  Intake --  Output 1075 ml  Net -1075 ml   Filed Weights   10/18/18 0347  Weight: 76.9 kg    Physical Exam   General appearance: delirious and no distress Lungs: clear to auscultation bilaterally Heart: regular rate and rhythm, S1, S2 normal and systolic murmur: early systolic 2/6, crescendo at 2nd right intercostal space Extremities: extremities normal, atraumatic, no cyanosis or edema Neurologic: Mental status: confused, eyes closed but does follow commands  Inpatient Medications    Scheduled Meds:  calcium carbonate  1,250 mg Oral Q breakfast   gabapentin  100 mg Oral QHS   losartan  25 mg Oral q morning - 10a   pantoprazole  40 mg Oral Daily   predniSONE  2.5 mg Oral Q breakfast   sertraline  50 mg Oral q morning - 10a   vitamin C  500 mg Oral Daily    Continuous Infusions:  diltiazem (CARDIZEM) infusion     heparin 1,200 Units/hr (10/19/18 0446)    PRN Meds: acetaminophen, alum & mag hydroxide-simeth,  hydrALAZINE, hydrocortisone, hydrocortisone cream, ipratropium-albuterol, lip balm, loratadine, metoprolol tartrate, Muscle Rub, ondansetron **OR** ondansetron (ZOFRAN) IV, phenol, polyethylene glycol, polyvinyl alcohol, senna-docusate, sodium chloride   Labs   Results for orders placed or performed during the hospital encounter of 10/17/18 (from the past 48 hour(s))  POC CBG, ED     Status: Abnormal   Collection Time: 10/17/18  8:37 PM  Result Value Ref Range   Glucose-Capillary 156 (H) 70 - 99 mg/dL  CBC with Differential     Status: Abnormal   Collection Time: 10/17/18  9:00 PM  Result Value Ref Range   WBC 15.1 (H) 4.0 - 10.5 K/uL   RBC 5.26 (H) 3.87 - 5.11 MIL/uL   Hemoglobin 16.4 (H) 12.0 - 15.0 g/dL   HCT 49.1 (H) 36.0 - 46.0 %   MCV 93.3 80.0 - 100.0 fL   MCH 31.2 26.0 - 34.0 pg   MCHC 33.4 30.0 - 36.0 g/dL   RDW 12.8 11.5 - 15.5 %   Platelets 261 150 - 400 K/uL   nRBC 0.0 0.0 - 0.2 %   Neutrophils Relative % 83 %   Neutro Abs 12.5 (H) 1.7 - 7.7 K/uL   Lymphocytes Relative 10 %   Lymphs Abs 1.5 0.7 - 4.0 K/uL   Monocytes Relative 7 %   Monocytes Absolute 1.1 (H)  0.1 - 1.0 K/uL   Eosinophils Relative 0 %   Eosinophils Absolute 0.0 0.0 - 0.5 K/uL   Basophils Relative 0 %   Basophils Absolute 0.0 0.0 - 0.1 K/uL   Immature Granulocytes 0 %   Abs Immature Granulocytes 0.06 0.00 - 0.07 K/uL    Comment: Performed at [facility] [address], [place], [place] [address]  Basic metabolic panel     Status: Abnormal   Collection Time: 10/17/18  9:00 PM  Result Value Ref Range   Sodium 138 135 - 145 mmol/L   Potassium 6.0 (H) 3.5 - 5.1 mmol/L    Comment: SPECIMEN HEMOLYZED. HEMOLYSIS MAY AFFECT INTEGRITY OF RESULTS.   Chloride 101 98 - 111 mmol/L   CO2 24 22 - 32 mmol/L   Glucose, Bld 156 (H) 70 - 99 mg/dL   BUN 17 8 - 23 mg/dL   Creatinine, Ser 0.83 0.44 - 1.00 mg/dL   Calcium 9.3 8.9 - 10.3 mg/dL   GFR calc non Af Amer >60 >60 mL/min   GFR calc Af Amer >60 >60  mL/min   Anion gap 13 5 - 15    Comment: Performed at [facility] [address] [place], [place] [address]  Hepatic function panel     Status: Abnormal   Collection Time: 10/17/18  9:00 PM  Result Value Ref Range   Total Protein 7.2 6.5 - 8.1 g/dL   Albumin 3.9 3.5 - 5.0 g/dL   AST 112 (H) 15 - 41 U/L   ALT 35 0 - 44 U/L   Alkaline Phosphatase 60 38 - 126 U/L   Total Bilirubin 2.9 (H) 0.3 - 1.2 mg/dL   Bilirubin, Direct 1.1 (H) 0.0 - 0.2 mg/dL   Indirect Bilirubin 1.8 (H) 0.3 - 0.9 mg/dL    Comment: Performed at [facility] [address] [place], [place] [address]  Basic metabolic panel     Status: Abnormal   Collection Time: 10/17/18 10:50 PM  Result Value Ref Range   Sodium 142 135 - 145 mmol/L   Potassium 3.5 3.5 - 5.1 mmol/L   Chloride 102 98 - 111 mmol/L   CO2 26 22 - 32 mmol/L   Glucose, Bld 161 (H) 70 - 99 mg/dL   BUN 17 8 - 23 mg/dL   Creatinine, Ser 0.78 0.44 - 1.00 mg/dL   Calcium 9.3 8.9 - 10.3 mg/dL   GFR calc non Af Amer >60 >60 mL/min   GFR calc Af Amer >60 >60 mL/min   Anion gap 14 5 - 15    Comment: Performed at [facility], [place] [address] [place], [place] [address]  Urinalysis, Routine w reflex microscopic     Status: Abnormal   Collection Time: 10/17/18 10:58 PM  Result Value Ref Range   Color, Urine YELLOW YELLOW   APPearance CLEAR CLEAR   Specific Gravity, Urine 1.023 1.005 - 1.030   pH 5.0 5.0 - 8.0   Glucose, UA NEGATIVE NEGATIVE mg/dL   Hgb urine dipstick NEGATIVE NEGATIVE   Bilirubin Urine NEGATIVE NEGATIVE   Ketones, ur 20 (A) NEGATIVE mg/dL   Protein, ur 100 (A) NEGATIVE mg/dL   Nitrite NEGATIVE NEGATIVE   Leukocytes,Ua NEGATIVE NEGATIVE   RBC / HPF 0-5 0 - 5 RBC/hpf   Bacteria, UA NONE SEEN NONE SEEN   Squamous Epithelial / LPF 0-5 0 - 5   Mucus PRESENT     Comment: Performed at [facility], [address] [place], [place] [address]  CK     Status: Abnormal   Collection Time: 10/17/18 11:15 PM  Result  Value Ref Range   Total CK 1,449 (H) 38 - 234 U/L    Comment: Performed at [facility], [place] [address] [place], [place] [address]  SARS Coronavirus 2 (CEPHEID - Performed in [facility] lab), Hosp Order     Status: None   Collection Time: 10/18/18  1:04 AM   Specimen: Nasopharyngeal Swab  Result Value Ref Range   SARS Coronavirus 2 NEGATIVE NEGATIVE    Comment: (NOTE) If result is NEGATIVE SARS-CoV-2 target nucleic acids are NOT DETECTED. The SARS-CoV-2 RNA is generally detectable in upper and lower  respiratory specimens during the acute phase of infection. The lowest  concentration of SARS-CoV-2 viral copies this assay can detect is 250  copies / mL. A negative result does not preclude SARS-CoV-2 infection  and should not be used as the sole basis for treatment or other  patient management decisions.  A negative result may occur with  improper specimen collection / handling, submission of specimen other  than nasopharyngeal swab, presence of viral mutation(s) within the  areas targeted by this assay, and inadequate number of viral copies  (<250 copies / mL). A negative result must be combined with clinical  observations, patient history, and epidemiological information. If result is POSITIVE SARS-CoV-2 target nucleic acids are DETECTED. The SARS-CoV-2 RNA is generally detectable in upper and lower  respiratory specimens dur ing the acute phase of infection.  Positive  results are indicative of active infection with SARS-CoV-2.  Clinical  correlation with patient history and other diagnostic information is  necessary to determine patient infection status.  Positive results do  not rule out bacterial infection or co-infection with other viruses. If result is PRESUMPTIVE POSTIVE SARS-CoV-2 nucleic acids MAY BE PRESENT.   A presumptive positive result was obtained on the submitted specimen  and confirmed on repeat testing.  While 2019 novel coronavirus  (SARS-CoV-2)  nucleic acids may be present in the submitted sample  additional confirmatory testing may be necessary for epidemiological  and / or clinical management purposes  to differentiate between  SARS-CoV-2 and other Sarbecovirus currently known to infect humans.  If clinically indicated additional testing with an alternate test  methodology [phone]) is advised. The SARS-CoV-2 RNA is generally  detectable in upper and lower respiratory sp ecimens during the acute  phase of infection. The expected result is Negative. Fact Sheet for Patients:  StrictlyIdeas.no Fact Sheet for Healthcare Providers: BankingDealers.co.za This test is not yet approved or cleared by the [place] FDA and has been authorized for detection and/or diagnosis of SARS-CoV-2 by FDA under an Emergency Use Authorization (EUA).  This EUA will remain in effect (meaning this test can be used) for the duration of the COVID-19 declaration under Section 564(b)(1) of the Act, 21 U.S.C. section 360bbb-3(b)(1), unless the authorization is terminated or revoked sooner. Performed at [facility], [place] [address], [place], [place] [address]   Heparin level (unfractionated)     Status: Abnormal   Collection Time: 10/18/18  5:03 AM  Result Value Ref Range   Heparin Unfractionated 0.28 (L) 0.30 - 0.70 IU/mL    Comment: (NOTE) If heparin results are below expected values, and patient dosage has  been confirmed, suggest follow up testing of antithrombin III levels. Performed at [facility], [place] [address] [place],  [address]  APTT     Status: None   Collection Time: 10/18/18  5:03 AM  Result Value Ref Range   aPTT 24 24 - 36 seconds    Comment: Performed at [facility] [address], [place], [place] [address]  Protime-INR     Status: None   Collection Time: 10/18/18  5:03 AM  Result Value Ref Range   Prothrombin Time 14.3 11.4 - 15.2 seconds   INR 1.1  0.8 - 1.2    Comment: (NOTE) INR goal varies based on device and disease states. Performed at [facility], [place] [address], [place], [place] [address]   Ammonia     Status: None   Collection Time: 10/18/18  5:03 AM  Result Value Ref Range   Ammonia 25 9 - 35 umol/L    Comment: Performed at [facility], [place] [address], [place], [place] [address]  CBC WITH DIFFERENTIAL     Status: Abnormal   Collection Time: 10/18/18  5:03 AM  Result Value Ref Range   WBC 15.8 (H) 4.0 - 10.5 K/uL   RBC 4.59 3.87 - 5.11 MIL/uL   Hemoglobin 14.6 12.0 - 15.0 g/dL   HCT 43.4 36.0 - 46.0 %   MCV 94.6 80.0 - 100.0 fL   MCH 31.8 26.0 - 34.0 pg   MCHC 33.6 30.0 - 36.0 g/dL   RDW 12.8 11.5 - 15.5 %   Platelets 254 150 - 400 K/uL   nRBC 0.0 0.0 - 0.2 %   Neutrophils Relative % 87 %   Neutro Abs 13.6 (H) 1.7 - 7.7 K/uL   Lymphocytes Relative 6 %   Lymphs Abs 0.9 0.7 - 4.0 K/uL   Monocytes Relative 7 %   Monocytes Absolute 1.2 (H) 0.1 - 1.0 K/uL   Eosinophils Relative 0 %   Eosinophils Absolute 0.0 0.0 - 0.5 K/uL   Basophils Relative 0 %   Basophils Absolute 0.0 0.0 - 0.1 K/uL   Immature Granulocytes 0 %   Abs Immature Granulocytes 0.05 0.00 - 0.07 K/uL    Comment: Performed at [facility] [address], [place], [place] [address]  Basic metabolic panel     Status: Abnormal   Collection Time: 10/18/18  5:03 AM  Result Value Ref Range   Sodium 141 135 - 145 mmol/L   Potassium 3.3 (L) 3.5 - 5.1 mmol/L   Chloride 104 98 - 111 mmol/L   CO2 23 22 - 32 mmol/L   Glucose, Bld 204 (H) 70 - 99 mg/dL   BUN 18 8 - 23 mg/dL   Creatinine, Ser 0.76 0.44 - 1.00 mg/dL   Calcium 9.0 8.9 - 10.3 mg/dL   GFR calc non Af Amer >60 >60 mL/min   GFR calc Af Amer >60 >60 mL/min   Anion gap 14 5 - 15    Comment: Performed at [facility] [address] [place], [place] [address]  Magnesium     Status: None   Collection Time: 10/18/18  5:03 AM  Result Value Ref Range   Magnesium 2.0 1.7 -  2.4 mg/dL    Comment: Performed at [facility] [address] [place], [place] [address]  CK     Status: Abnormal   Collection Time: 10/18/18  5:03 AM  Result Value Ref Range   Total CK 1,114 (H) 38 - 234 U/L    Comment: Performed at [facility], [place],  [place] [address]  Troponin I (High Sensitivity)     Status: Abnormal   Collection Time: 10/18/18  5:03 AM  Result Value Ref Range   Troponin I (High Sensitivity) 19 (H) <18 ng/L    Comment: (NOTE) Elevated high sensitivity troponin I (hsTnI) values and significant  changes across serial measurements may suggest ACS but many other  chronic and acute conditions are known to elevate hsTnI results.  Refer to the "Links" section for chest pain algorithms and additional  guidance. Performed at [facility], [place] [address] [place], [place] [address]   Hepatic function panel     Status: Abnormal   Collection Time: 10/18/18  6:14 AM  Result Value Ref Range   Total Protein 6.7 6.5 - 8.1 g/dL   Albumin 3.5 3.5 - 5.0 g/dL   AST 56 (H) 15 - 41 U/L   ALT 38 0 - 44 U/L   Alkaline Phosphatase 60 38 - 126 U/L   Total Bilirubin 1.3 (H) 0.3 - 1.2 mg/dL   Bilirubin, Direct 0.2 0.0 - 0.2 mg/dL   Indirect Bilirubin 1.1 (H) 0.3 - 0.9 mg/dL    Comment: Performed at [facility] [address] [place], [place] [address]  Troponin I (High Sensitivity)     Status: Abnormal   Collection Time: 10/18/18  6:14 AM  Result Value Ref Range   Troponin I (High Sensitivity) 22 (H) <18 ng/L    Comment: (NOTE) Elevated high sensitivity troponin I (hsTnI) values and significant  changes across serial measurements may suggest ACS but many other  chronic and acute conditions are known to elevate hsTnI results.  Refer to the "Links" section for chest pain algorithms and additional  guidance. Performed at [facility], [place] [address] [place], [place] [address]   Procalcitonin - Baseline     Status: None    Collection Time: 10/18/18  2:23 PM  Result Value Ref Range   Procalcitonin <0.10 ng/mL    Comment:        Interpretation: PCT (Procalcitonin) <= 0.5 ng/mL: Systemic infection (sepsis) is not likely. Local bacterial infection is possible. (NOTE)       Sepsis PCT Algorithm           Lower Respiratory Tract                                      Infection PCT Algorithm    ----------------------------     ----------------------------         PCT < 0.25 ng/mL                PCT < 0.10 ng/mL         Strongly encourage             Strongly discourage   discontinuation of antibiotics    initiation of antibiotics    ----------------------------     -----------------------------       PCT 0.25 - 0.50 ng/mL            PCT 0.10 - 0.25 ng/mL               OR       >80% decrease in PCT            Discourage initiation of  antibiotics      Encourage discontinuation           of antibiotics    ----------------------------     -----------------------------         PCT >= 0.50 ng/mL              PCT 0.26 - 0.50 ng/mL               AND        <80% decrease in PCT             Encourage initiation of                                             antibiotics       Encourage continuation           of antibiotics    ----------------------------     -----------------------------        PCT >= 0.50 ng/mL                  PCT > 0.50 ng/mL               AND         increase in PCT                  Strongly encourage                                      initiation of antibiotics    Strongly encourage escalation           of antibiotics                                     -----------------------------                                           PCT <= 0.25 ng/mL                                                 OR                                        > 80% decrease in PCT                                     Discontinue / Do not initiate                                              antibiotics Performed at [facility], [place] [address], [place], [place] [address]   Vitamin B12     Status: None   Collection Time: 10/18/18  2:23 PM  Result Value Ref Range   Vitamin B-12 207 180 - 914 pg/mL    Comment: (NOTE) This assay is not validated for testing neonatal or myeloproliferative syndrome specimens for Vitamin B12 levels. Performed at [facility], [place] [address] [place], [place] [address]   TSH     Status: None   Collection Time: 10/18/18  2:23 PM  Result Value Ref Range   TSH 2.888 0.350 - 4.500 uIU/mL    Comment: Performed by a 3rd Generation assay with a functional sensitivity of <=0.01 uIU/mL. Performed at [facility], [place] [address], [place], [place] [address]   Heparin level (unfractionated)     Status: None   Collection Time: 10/18/18  3:49 PM  Result Value Ref Range   Heparin Unfractionated 0.42 0.30 - 0.70 IU/mL    Comment: (NOTE) If heparin results are below expected values, and patient dosage has  been confirmed, suggest follow up testing of antithrombin III levels. Performed at [facility], [place] [address], [place], [place] [address]   APTT     Status: Abnormal   Collection Time: 10/18/18  3:49 PM  Result Value Ref Range   aPTT 45 (H) 24 - 36 seconds    Comment:        IF BASELINE aPTT IS ELEVATED, SUGGEST PATIENT RISK ASSESSMENT BE USED TO DETERMINE APPROPRIATE ANTICOAGULANT THERAPY. Performed at [facility], [place] [address] [place], [place] [address]   Heparin level (unfractionated)     Status: None   Collection Time: 10/19/18  3:32 AM  Result Value Ref Range   Heparin Unfractionated 0.51 0.30 - 0.70 IU/mL    Comment: (NOTE) If heparin results are below expected values, and patient dosage has  been confirmed, suggest follow up testing of antithrombin III levels. Performed at [facility], [place] [address], [place], [place] [address]   CBC     Status: Abnormal   Collection Time:  10/19/18  3:32 AM  Result Value Ref Range   WBC 17.7 (H) 4.0 - 10.5 K/uL   RBC 4.41 3.87 - 5.11 MIL/uL   Hemoglobin 13.8 12.0 - 15.0 g/dL   HCT 40.9 36.0 - 46.0 %   MCV 92.7 80.0 - 100.0 fL   MCH 31.3 26.0 - 34.0 pg   MCHC 33.7 30.0 - 36.0 g/dL   RDW 12.8 11.5 - 15.5 %   Platelets 241 150 - 400 K/uL   nRBC 0.0 0.0 - 0.2 %    Comment: Performed at [facility], [place] [address], [place], [place] [address]  Comprehensive metabolic panel     Status: Abnormal   Collection Time: 10/19/18  3:32 AM  Result Value Ref Range   Sodium 136 135 - 145 mmol/L   Potassium 3.5 3.5 - 5.1 mmol/L   Chloride 103 98 - 111 mmol/L   CO2 21 (L) 22 - 32 mmol/L   Glucose, Bld 203 (H) 70 - 99 mg/dL   BUN 11 8 - 23 mg/dL   Creatinine, Ser 0.71 0.44 - 1.00 mg/dL   Calcium 8.7 (L) 8.9 - 10.3 mg/dL   Total Protein 6.3 (L) 6.5 - 8.1 g/dL   Albumin 3.2 (L) 3.5 - 5.0 g/dL   AST 39 15 - 41 U/L   ALT 36 0 - 44 U/L   Alkaline Phosphatase 53 38 - 126 U/L   Total Bilirubin 1.1 0.3 - 1.2 mg/dL   GFR calc non Af Amer >  60 >60 mL/min   GFR calc Af Amer >60 >60 mL/min   Anion gap 12 5 - 15    Comment: Performed at [facility] [address], [place], [place] [address]  Magnesium     Status: None   Collection Time: 10/19/18  3:32 AM  Result Value Ref Range   Magnesium 1.9 1.7 - 2.4 mg/dL    Comment: Performed at [facility] [address], [place], [place] [address]  APTT     Status: Abnormal   Collection Time: 10/19/18  3:32 AM  Result Value Ref Range   aPTT 50 (H) 24 - 36 seconds    Comment:        IF BASELINE aPTT IS ELEVATED, SUGGEST PATIENT RISK ASSESSMENT BE USED TO DETERMINE APPROPRIATE ANTICOAGULANT THERAPY. Performed at [facility], [place] [address], [place], [place] [address]     ECG   N/A  Telemetry   Sinus with PVC's - Personally Reviewed  Radiology    Dg Chest 2 View  Result Date: 10/17/2018 CLINICAL DATA:  85 year old with right shoulder bruising. Fall.  Confusion. EXAM: CHEST - 2 VIEW COMPARISON:  Radiograph 10/02/2018 FINDINGS: The cardiomediastinal contours are unchanged. Moderate retrocardiac hiatal hernia with air-fluid level. Pulmonary vasculature is normal. No consolidation, pleural effusion, or pneumothorax. No acute osseous abnormalities are seen. Surgical clips in the right axilla. IMPRESSION: 1. No acute chest findings. 2. Moderate hiatal hernia. Electronically Signed   By: Keith Rake M.D.   On: 10/17/2018 22:17   Dg Pelvis 1-2 Views  Result Date: 10/17/2018 CLINICAL DATA:  85 year old with right hip bruising. Fall. Confusion. EXAM: PELVIS - 1-2 VIEW COMPARISON:  None. FINDINGS: The cortical margins of the bony pelvis are intact. No fracture. Pubic symphysis and sacroiliac joints are congruent. Both femoral heads are well-seated in the respective acetabula. Degenerative change of the pubic symphysis. Stool ball distends the rectum. IMPRESSION: No pelvic fracture. Electronically Signed   By: Keith Rake M.D.   On: 10/17/2018 22:18   Dg Shoulder Right  Result Date: 10/17/2018 CLINICAL DATA:  85 year old with right shoulder bruising. Fall. Confusion. EXAM: RIGHT SHOULDER - 2+ VIEW COMPARISON:  None. FINDINGS: There is no evidence of fracture or dislocation. There is no evidence of arthropathy or other focal bone abnormality. Mild soft tissue prominence in the supraclavicular region may be soft tissue overlap 4 hematoma/bruising. IMPRESSION: No fracture or dislocation of the right shoulder. Electronically Signed   By: Keith Rake M.D.   On: 10/17/2018 22:16   Dg Knee 2 Views Left  Result Date: 10/17/2018 CLINICAL DATA:  85 year old with left knee pain.  Fall. Confusion. EXAM: LEFT KNEE - 1-2 VIEW COMPARISON:  None. FINDINGS: Left knee arthroplasty in expected alignment. No periprosthetic lucency or fracture. There is been patellar resurfacing. No significant joint effusion. Vascular calcifications. IMPRESSION: Left knee arthroplasty  without complication or acute fracture. Electronically Signed   By: Keith Rake M.D.   On: 10/17/2018 22:17   Ct Head Wo Contrast  Result Date: 10/17/2018 CLINICAL DATA:  Found down, fall, confusion EXAM: CT HEAD WITHOUT CONTRAST CT CERVICAL SPINE WITHOUT CONTRAST TECHNIQUE: Multidetector CT imaging of the head and cervical spine was performed following the standard protocol without intravenous contrast. Multiplanar CT image reconstructions of the cervical spine were also generated. COMPARISON:  None. FINDINGS: CT HEAD FINDINGS Brain: No evidence of acute infarction, hemorrhage, hydrocephalus, extra-axial collection or mass lesion/mass effect. Periventricular white matter hypodensity. Vascular: No hyperdense vessel or unexpected  calcification. Skull: Normal. Negative for fracture or focal lesion. Sinuses/Orbits: No acute finding. Other: None. CT CERVICAL SPINE FINDINGS Alignment: Normal. Skull base and vertebrae: No acute fracture. No primary bone lesion or focal pathologic process. Soft tissues and spinal canal: No prevertebral fluid or swelling. No visible canal hematoma. Disc levels: Moderate multilevel disc space height loss and osteophytosis. Upper chest: Negative. Other: None. IMPRESSION: 1. No acute intracranial pathology. Small-vessel white matter disease. 2. No fracture or static subluxation of the cervical spine. Moderate multilevel disc degenerative disease and osteophytosis. Electronically Signed   By: Eddie Candle M.D.   On: 10/17/2018 21:39   Ct Cervical Spine Wo Contrast  Result Date: 10/17/2018 CLINICAL DATA:  Found down, fall, confusion EXAM: CT HEAD WITHOUT CONTRAST CT CERVICAL SPINE WITHOUT CONTRAST TECHNIQUE: Multidetector CT imaging of the head and cervical spine was performed following the standard protocol without intravenous contrast. Multiplanar CT image reconstructions of the cervical spine were also generated. COMPARISON:  None. FINDINGS: CT HEAD FINDINGS Brain: No evidence of  acute infarction, hemorrhage, hydrocephalus, extra-axial collection or mass lesion/mass effect. Periventricular white matter hypodensity. Vascular: No hyperdense vessel or unexpected calcification. Skull: Normal. Negative for fracture or focal lesion. Sinuses/Orbits: No acute finding. Other: None. CT CERVICAL SPINE FINDINGS Alignment: Normal. Skull base and vertebrae: No acute fracture. No primary bone lesion or focal pathologic process. Soft tissues and spinal canal: No prevertebral fluid or swelling. No visible canal hematoma. Disc levels: Moderate multilevel disc space height loss and osteophytosis. Upper chest: Negative. Other: None. IMPRESSION: 1. No acute intracranial pathology. Small-vessel white matter disease. 2. No fracture or static subluxation of the cervical spine. Moderate multilevel disc degenerative disease and osteophytosis. Electronically Signed   By: Eddie Candle M.D.   On: 10/17/2018 21:39   Mr Brain Wo Contrast  Result Date: 10/18/2018 CLINICAL DATA:  Altered level of consciousness, unexplained EXAM: MRI HEAD WITHOUT CONTRAST TECHNIQUE: Multiplanar, multiecho pulse sequences of the brain and surrounding structures were obtained without intravenous contrast. COMPARISON:  Head CT from yesterday FINDINGS: Brain: No acute infarction, hemorrhage, hydrocephalus, extra-axial collection or mass lesion. Moderate chronic small vessel ischemia in this cerebral white matter. Mild to moderate cerebral volume loss without specific pattern. Vascular: Major flow voids are preserved Skull and upper cervical spine: Negative for marrow lesion. Cervical spine degeneration as seen by CT yesterday. Sinuses/Orbits: Negative Other: Left more than right mastoid opacification with negative nasopharynx. On the more extensive left this is chronic based on sclerosis in 2012 comparison IMPRESSION: Senescent changes without acute or reversible finding. Electronically Signed   By: Monte Fantasia M.D.   On: 10/18/2018 12:16    Nm Hepato W/eject Fract  Result Date: 10/18/2018 CLINICAL DATA:  Abdominal tenderness and elevated liver enzymes EXAM: NUCLEAR MEDICINE HEPATOBILIARY IMAGING WITH GALLBLADDER EF VIEWS: Anterior right upper quadrant RADIOPHARMACEUTICALS:  4.7 mCi Tc-48m  Choletec IV COMPARISON:  None. FINDINGS: Liver uptake of radiotracer is unremarkable. There is prompt visualization of gallbladder and small bowel, indicating patency of the cystic and common bile ducts. The patient consumed 8 ounces of Ensure Enlive with calculation of the computer generated ejection fraction of radiotracer from the gallbladder. The patient did not experience clinical symptoms with the oral Ensure consumption. The computer generated ejection fraction of radiotracer from the gallbladder is normal at 89%, normal greater than 33% using the oral agent. IMPRESSION: Study within normal limits. Electronically Signed   By: Lowella Grip III M.D.   On: 10/18/2018 11:06   US Abdomen Limited  Result  Date: 10/17/2018 CLINICAL DATA:  Right upper quadrant pain for 1 day, elevated LFTs EXAM: ULTRASOUND ABDOMEN LIMITED RIGHT UPPER QUADRANT COMPARISON:  None. FINDINGS: Gallbladder: Gallbladder is well distended with evidence of cholelithiasis. Negative sonographic Murphy's sign is noted. Common bile duct: Diameter: 5 mm. Liver: No focal lesion identified. Within normal limits in parenchymal echogenicity. Portal vein is patent on color Doppler imaging with normal direction of blood flow towards the liver. IMPRESSION: Cholelithiasis without complicating factors. Electronically Signed   By: Inez Catalina M.D.   On: 10/17/2018 23:20   Dg Humerus Right  Result Date: 10/17/2018 CLINICAL DATA:  85 year old with right shoulder bruising. Fall. Confusion. EXAM: RIGHT HUMERUS - 2+ VIEW COMPARISON:  None. FINDINGS: Cortical margins of the humerus are intact. There is no evidence of fracture or other focal bone lesions. Scattered soft tissue calcifications. Soft  tissue fullness of the upper arm may be related to underlying bruising. IMPRESSION: No fracture of the right humerus. Electronically Signed   By: Keith Rake M.D.   On: 10/17/2018 22:19    Cardiac Studies   N/A  Assessment   1. Principal Problem: 2.   Acute encephalopathy 3. Active Problems: 4.   Personal history of malignant neoplasm of breast 5.   Chronic anticoagulation 6.   Essential hypertension 7.   Atrial fibrillation, chronic 8.   Gallstones 9.   Plan   1. Converted back to NSR - would transition from IV heparin back to Eliquis. Afib is being driving by underlying etiology of AMS. Restart oral diltiazem but increase to 180 mg daily. No further cardiac suggestions at this point.  [facility] will sign off.   Medication Recommendations:  As above Other recommendations (labs, testing, etc):  none Follow up as an outpatient:  Dr. Sallyanne Kuster or [facility] APP after discharge  Time Spent Directly with Patient:  I have spent a total of 25 minutes with the patient reviewing hospital notes, telemetry, EKGs, labs and examining the patient as well as establishing an assessment and plan that was discussed personally with the patient.  > 50% of time was spent in direct patient care.  Length of Stay:  LOS: 0 days   Pixie Casino, MD, [facility], [place] Director of the Advanced Lipid Disorders &  Cardiovascular Risk Reduction Clinic Diplomate of the American Board of Clinical Lipidology Attending Cardiologist  Direct Dial: [phone]   Fax: [phone]  Website:  [email or web address]  Nadean Corwin Timmya Blazier 10/19/2018, 10:14 AM

## 2018-10-19 NOTE — Progress Notes (Signed)
Patient being discharged home with home health. Education and information given to patient and patient's daughter. IV removed. CCMD notified. All belongings with patient. Patient leaving unit via wheelchair.

## 2018-10-19 NOTE — TOC Transition Note (Signed)
Transition of Care [facility]) - CM/SW Discharge Note   Patient Details  Name: [name] MRN: [id number] Date of Birth: June 23, 1925  Transition of Care [facility]) CM/SW Contact:  Pollie Friar, RN Phone Number: 10/19/2018, 12:03 PM   Clinical Narrative:    Pt is from [place]. She is returning today with orders for [facility]. [facility] does the therapy and [place] services for the facility. [facility] sent to [facility] with [facility], she will get the needed [facility] services arranged.  Daughter is aware of [facility] doing the [facility] and is in agreement.  Daughter to provide transportation to the facility and the other daughter is going to provide supervision for the first couple of days.    Final next level of care: Home w Home Health Services Barriers to Discharge: Continued Medical Work up   Patient Goals and CMS Choice   CMS Medicare.gov Compare Post Acute Care list provided to:: Other (Comment Required)(independent living)    Discharge Placement                       Discharge Plan and Services   Discharge Planning Services: CM Consult Post Acute Care Choice: Home Health                    HH Arranged: PT, OT, Nurse's Aide, RN, Social Work [employer] Agency: ([place] for their [facility])     Representative spoke with at [place] through [place] ([place]) Interventions     Readmission Risk Interventions No flowsheet data found.

## 2018-10-19 NOTE — TOC Initial Note (Signed)
Transition of Care [facility]) - Initial/Assessment Note    Patient Details  Name: [name] MRN: [id number] Date of Birth: 1925-10-28  Transition of Care [facility]) CM/SW Contact:    Pollie Friar, RN Phone Number: 10/19/2018, 12:03 PM  Clinical Narrative:                   Expected Discharge Plan: [place] Barriers to Discharge: Continued Medical Work up   Patient Goals and CMS Choice   CMS Medicare.gov Compare Post Acute Care list provided to:: Other (Comment Required)(independent living)    Expected Discharge Plan and Services Expected Discharge Plan: [place]   Discharge Planning Services: CM Consult Post Acute Care Choice: [place] arrangements for the past 2 months: Apartment Expected Discharge Date: 10/19/18                         HH Arranged: PT, OT, Nurse's Aide, RN, Social Work [employer] Agency: ([place] uses [facility] for their [facility])     Representative spoke with at [place] through [employer]  Prior Living Arrangements/Services Living arrangements for the past 2 months: [place] with:: Self Patient language and need for interpreter reviewed:: Yes(no needs) Do you feel safe going back to the place where you live?: Yes      Need for Family Participation in Patient Care: Yes (Comment)(intermittent.) Care giver support system in place?: Yes (comment)(daughter to stay with her the first couple days)   Criminal Activity/Legal Involvement Pertinent to Current Situation/Hospitalization: No - Comment as needed  Activities of Daily Living      Permission Sought/Granted                  Emotional Assessment Appearance:: Appears stated age Attitude/Demeanor/Rapport: Engaged Affect (typically observed): Accepting, Pleasant Orientation: : Oriented to Self, Oriented to Place   Psych Involvement: No (comment)  Admission diagnosis:  Acute encephalopathy [G93.40] Patient Active Problem List   Diagnosis Date Noted  . Atrial fibrillation, chronic 10/18/2018  . Gallstones 10/18/2018  . Acute encephalopathy 10/17/2018  . Atrial fibrillation, new onset ([place]) 09/13/2016  . Chronic anticoagulation 09/13/2016  . Essential hypertension 09/13/2016  . Chest pain with moderate risk of acute coronary syndrome 09/12/2016  . Breast cancer, right breast, recurrent 06/03/2011  . Personal history of malignant neoplasm of breast 04/14/1987   PCP:  Mayra Neer, MD Pharmacy:   [id number] - [place], [place] [address] Phone: [phone] Fax: [phone]     Social Determinants of Health (SDOH) Interventions    Readmission Risk Interventions No flowsheet data found.

## 2018-10-19 NOTE — Progress Notes (Signed)
[name] for Heparin Indication: atrial fibrillation  Allergies  Allergen Reactions  . Arimidex [Anastrozole] Other (See Comments)    Neck cramping  . Fish Allergy Other (See Comments)    Patient prefers to NOT eat this  . Morphine And Related Nausea And Vomiting  . Shellfish Allergy Other (See Comments)    Patient prefers to NOT eat this  . Vicodin [Hydrocodone-Acetaminophen] Nausea Only    DOES tolerate Vicodin/Norco, however    Patient Measurements: Weight: 169 lb 8.5 oz (76.9 kg)  Vital Signs: Temp: 98.2 F (36.8 C) (07/08 0810) Temp Source: Oral (07/08 0810) BP: 175/76 (07/08 0810) Pulse Rate: 72 (07/08 0810)  Labs: Recent Labs    10/17/18 2100 10/17/18 2250 10/17/18 2315 10/18/18 0503 10/18/18 1287 10/18/18 1549 10/19/18 0332  HGB 16.4*  --   --  14.6  --   --  13.8  HCT 49.1*  --   --  43.4  --   --  40.9  PLT 261  --   --  254  --   --  241  APTT  --   --   --  24  --  45* 50*  LABPROT  --   --   --  14.3  --   --   --   INR  --   --   --  1.1  --   --   --   HEPARINUNFRC  --   --   --  0.28*  --  0.42 0.51  CREATININE 0.83 0.78  --  0.76  --   --  0.71  CKTOTAL  --   --  1,449* 1,114*  --   --   --   TROPONINIHS  --   --   --  19* 22*  --   --     Estimated Creatinine Clearance: 47 mL/min (by C-G formula based on SCr of 0.71 mg/dL).   Assessment: 85 y.o. female admitted s/p fall, h/o Afib and Eliquis on hold (LD 7/4), for heparin.   Heparin level therapeutic this AM CBC stable  Goal of Therapy:  APTT 66-102 Heparin level 0.3-0.7 units/ml Monitor platelets by anticoagulation protocol: Yes   Plan:  Continue heparin at 1200 units / hr Follow up AM labs   Back to Eliquis?  Thank you,  Anette Guarneri, PharmD [phone]

## 2018-10-19 NOTE — NC FL2 (Signed)
[place] MEDICAID FL2 LEVEL OF CARE SCREENING TOOL     IDENTIFICATION  Patient Name: [name] Birthdate: Jul 21, 1925 Sex: female Admission Date (Current Location): 10/17/2018  [facility] and [place] Number:  Herbalist and Address:  [facility], [place] [address] [place], [place] [address]      Provider Number: [id number]  Attending Physician Name and Address:  Damita Lack, MD  Relative Name and Phone Number:       Current Level of Care: Hospital Recommended Level of Care: Assisted Living Facility([place]) Prior Approval Number:    Date Approved/Denied:   PASRR Number:    Discharge Plan: Other (Comment)(Assisted living)    Current Diagnoses: Patient Active Problem List   Diagnosis Date Noted  . Atrial fibrillation, chronic 10/18/2018  . Gallstones 10/18/2018  . Acute encephalopathy 10/17/2018  . Atrial fibrillation, new onset ([place]) 09/13/2016  . Chronic anticoagulation 09/13/2016  . Essential hypertension 09/13/2016  . Chest pain with moderate risk of acute coronary syndrome 09/12/2016  . Breast cancer, right breast, recurrent 06/03/2011  . Personal history of malignant neoplasm of breast 04/14/1987    Orientation RESPIRATION BLADDER Height & Weight        Normal Incontinent Weight: 76.9 kg Height:     BEHAVIORAL SYMPTOMS/MOOD NEUROLOGICAL BOWEL NUTRITION STATUS      Incontinent Diet(heart healthy)  AMBULATORY STATUS COMMUNICATION OF NEEDS Skin   Limited Assist Verbally Skin abrasions, Bruising(bruising to arm and back/ MASD to perineum/ abrasion to coccyx)                       Personal Care Assistance Level of Assistance  Bathing, Dressing, Feeding Bathing Assistance: Limited assistance Feeding assistance: Independent Dressing Assistance: Limited assistance     Functional Limitations Info  Sight, Speech, Hearing Sight Info: Adequate Hearing Info: Adequate Speech Info: Adequate    SPECIAL CARE  FACTORS FREQUENCY  PT (By licensed PT), OT (By licensed OT)     PT Frequency: 3x/wk OT Frequency: 3x/wk            Contractures Contractures Info: Not present    Additional Factors Info  Code Status, Allergies, Psychotropic Code Status Info: Full Allergies Info: Arimidex/ Morphine/ fish/ shellfish/ vicodin Psychotropic Info: Neurontin 100 mg at bedtime/ Zoloft 50 mg in the am         Current Medications (10/19/2018):  This is the current hospital active medication list Current Facility-Administered Medications  Medication Dose Route Frequency Provider Last Rate Last Dose  . alum & mag hydroxide-simeth (MAALOX/MYLANTA) 200-200-20 MG/5ML suspension 30 mL  30 mL Oral Q4H PRN [place] Sylvan Chirag, MD      . calcium carbonate (OS-CAL - dosed in mg of elemental calcium) tablet 1,250 mg  1,250 mg Oral Q breakfast Rise Patience, MD   1,250 mg at 10/19/18 0850  . diltiazem (CARDIZEM) 100 mg in dextrose 5% 133mL (1 mg/mL) infusion  5-15 mg/hr Intravenous Continuous Daune Perch, NP      . gabapentin (NEURONTIN) capsule 100 mg  100 mg Oral QHS Rise Patience, MD   100 mg at 10/18/18 2148  . heparin ADULT infusion 100 units/mL (25000 units/21mL sodium chloride 0.45%)  1,200 Units/hr Intravenous Continuous Nereyda Bowler Chirag, MD 12 mL/hr at 10/19/18 0446 1,200 Units/hr at 10/19/18 0446  . hydrALAZINE (APRESOLINE) injection 10 mg  10 mg Intravenous Q4H PRN Merick Kelleher Chirag, MD      . HYDROcodone-acetaminophen (NORCO/VICODIN) 5-325 MG per  tablet 1 tablet  1 tablet Oral Daily PRN Rise Patience, MD      . hydrocortisone (ANUSOL-HC) 2.5 % rectal cream 1 application  1 application Topical QID PRN Raymonde Hamblin Chirag, MD      . hydrocortisone cream 1 % 1 application  1 application Topical TID PRN Feige Lowdermilk Chirag, MD      . ipratropium-albuterol (DUONEB) 0.5-2.5 (3) MG/3ML nebulizer solution 3 mL  3 mL Nebulization Q4H PRN Beyonce Sawatzky Chirag, MD      . lip balm (CARMEX) ointment  1 application  1 application Topical PRN Kemonie Cutillo Chirag, MD      . loratadine (CLARITIN) tablet 10 mg  10 mg Oral Daily PRN [place] Canupp Chirag, MD      . losartan (COZAAR) tablet 25 mg  25 mg Oral q morning - 10a Rise Patience, MD   25 mg at 10/18/18 1435  . metoprolol tartrate (LOPRESSOR) injection 5 mg  5 mg Intravenous Q4H PRN Taryne Kiger Chirag, MD   5 mg at 10/18/18 1317  . Muscle Rub CREA 1 application  1 application Topical PRN Delynn Pursley Chirag, MD      . ondansetron (ZOFRAN) tablet 4 mg  4 mg Oral Q6H PRN Rise Patience, MD       Or  . ondansetron [facility]) injection 4 mg  4 mg Intravenous Q6H PRN Rise Patience, MD      . pantoprazole (PROTONIX) EC tablet 40 mg  40 mg Oral Daily Rise Patience, MD   40 mg at 10/18/18 1436  . phenol (CHLORASEPTIC) mouth spray 1 spray  1 spray Mouth/Throat PRN Qiana Landgrebe Chirag, MD      . polyethylene glycol (MIRALAX / GLYCOLAX) packet 17 g  17 g Oral Daily PRN Ingrid Shifrin Chirag, MD      . polyvinyl alcohol (LIQUIFILM TEARS) 1.4 % ophthalmic solution 1 drop  1 drop Both Eyes PRN Candiss Galeana Chirag, MD      . predniSONE (DELTASONE) tablet 2.5 mg  2.5 mg Oral Q breakfast Rise Patience, MD   2.5 mg at 10/19/18 0849  . senna-docusate (Senokot-S) tablet 2 tablet  2 tablet Oral QHS PRN Eliav Mechling Chirag, MD      . sertraline (ZOLOFT) tablet 50 mg  50 mg Oral q morning - 10a Rise Patience, MD   50 mg at 10/18/18 1436  . sodium chloride (OCEAN) 0.65 % nasal spray 1 spray  1 spray Each Nare PRN Nils Thor Chirag, MD      . vitamin C (ASCORBIC ACID) tablet 500 mg  500 mg Oral Daily Rise Patience, MD   500 mg at 10/18/18 1435     Discharge Medications: Please see discharge summary for a list of discharge medications.  Relevant Imaging Results:  Relevant Lab Results:   Additional Information SS#: [id number]  Pollie Friar, RN

## 2018-10-19 NOTE — Evaluation (Signed)
Occupational Therapy Evaluation Patient Details Name: [name] MRN: [id number] DOB: 1925-09-23 Today's Date: 10/19/2018    History of Present Illness 85 y.o. admitted on 10/17/18 after being found down on the floor at her independent living apartment.  CT head and c-spine were negative, MRI negative, EEG showing cortical irritability.  Dx with acute encephaolpathy and generalized weakness, medical workup continues.  Mild rhabdomyolysis due to fall, rapid rate a-fib (with h/o A-fib).  Pt with other significant PMH of HTN, chronic back pain, breast CA, bil TKA, R mastectomy, and shoulder surgery.   Clinical Impression   Pt PTA: from ILF,receiving HH therapy.Pt currently minA for bed mobility; minguardA to minA for transfers and ambulating in room with RW with fair balance and minguardA.[place] for LB dressing and set-upA for UB ADL. Pt with pain reported with positional movement in bed c/o "Oh,my Right hip." and reports headache pain. Rn aware of pain. Pt would benefit from continued OT skilled services for ADL,mobility and safety. Pt would benefit from 24/7 for a little while until pt has returned to her baseline functioning level. May have to consider SNF if family is unable to arrange 24/7 care. OT following acutely.    Follow Up Recommendations  Home health OT;Supervision/Assistance - 24 hour    Equipment Recommendations  None recommended by OT    Recommendations for Other Services       Precautions / Restrictions Precautions Precautions: Fall Restrictions Weight Bearing Restrictions: No      Mobility Bed Mobility Overal bed mobility: Needs Assistance Bed Mobility: Supine to Sit;Sit to Supine     Supine to sit: Min assist;HOB elevated Sit to supine: Min assist   General bed mobility comments: Min assist to support trunk to come up to sitting EOB and lightly assist legs to return to supine in the bed.   Transfers Overall transfer level: Needs assistance Equipment  used: Rolling walker (2 wheeled) Transfers: Sit to/from Stand Sit to Stand: Min guard;Min assist         General transfer comment: minguardA from bed to standing;commode to standing minA for power up    Balance Overall balance assessment: Needs assistance Sitting-balance support: Feet supported;Bilateral upper extremity supported Sitting balance-Leahy Scale: Fair Sitting balance - Comments: unable to donn socks.   Standing balance support: Bilateral upper extremity supported Standing balance-Leahy Scale: Fair Standing balance comment: standing at sink unsupported                           ADL either performed or assessed with clinical judgement   ADL Overall ADL's : Needs assistance/impaired Eating/Feeding: Set up;Sitting   Grooming: Set up;Supervision/safety;Standing   Upper Body Bathing: Set up;Sitting   Lower Body Bathing: Min guard;Sitting/lateral leans;Sit to/from stand   Upper Body Dressing : Supervision/safety;Set up;Sitting   Lower Body Dressing: Min guard;Sitting/lateral leans;Sit to/from stand   Toilet Transfer: Min guard;Regular Toilet;Grab bars   Toileting- Water quality scientist and Hygiene: Minimal assistance;Sitting/lateral lean;Sit to/from stand       Functional mobility during ADLs: Min guard;Rolling walker;Cueing for safety General ADL Comments: Pt with decreased strength and activity tolerance. Pt standing for toilet hygiene with RW and Pt stood at sink for grooming     Vision Baseline Vision/History: No visual deficits Vision Assessment?: No apparent visual deficits     Perception     Praxis      Pertinent Vitals/Pain Pain Assessment: Faces Faces Pain Scale: Hurts even more Pain Location: "hip and my  acid reflux" Pain Descriptors / Indicators: Grimacing;Guarding Pain Intervention(s): Monitored during session     Hand Dominance Right   Extremity/Trunk Assessment Upper Extremity Assessment Upper Extremity Assessment:  Generalized weakness   Lower Extremity Assessment Lower Extremity Assessment: Defer to PT evaluation;Generalized weakness   Cervical / Trunk Assessment Cervical / Trunk Assessment: Kyphotic   Communication Communication Communication: HOH   Cognition Arousal/Alertness: Lethargic Behavior During Therapy: WFL for tasks assessed/performed Overall Cognitive Status: Impaired/Different from baseline Area of Impairment: Orientation;Attention;Memory;Following commands;Safety/judgement;Awareness;Problem solving                 Orientation Level: Disoriented to;Time;Situation Current Attention Level: Sustained Memory: Decreased short-term memory(memory deficits at baseline) Following Commands: Follows one step commands consistently Safety/Judgement: Decreased awareness of deficits Awareness: Intellectual Problem Solving: Slow processing;Decreased initiation;Difficulty sequencing;Requires verbal cues;Requires tactile cues     General Comments       Exercises     Shoulder Instructions      Home Living Family/patient expects to be discharged to:: Private residence Living Arrangements: Alone Available Help at Discharge: Personal care attendant Type of Home: Independent living facility Home Access: Level entry     Home Layout: One level               Home Equipment: Walker - 4 wheels          Prior Functioning/Environment Level of Independence: Needs assistance  Gait / Transfers Assistance Needed: Pt was mod I ambulating with her rollator.     Communication / Swallowing Assistance Needed: HOH          OT Problem List: Decreased strength;Decreased activity tolerance;Impaired balance (sitting and/or standing);Decreased safety awareness;Decreased cognition;Pain      OT Treatment/Interventions: Self-care/ADL training;Therapeutic exercise;Neuromuscular education;Energy conservation;Therapeutic activities;Balance training;Patient/family education    OT  Goals(Current goals can be found in the care plan section) Acute Rehab OT Goals Patient Stated Goal: none stated OT Goal Formulation: Patient unable to participate in goal setting Time For Goal Achievement: 11/02/18 Potential to Achieve Goals: Good ADL Goals Pt Will Perform Grooming: with modified independence;standing Pt Will Perform Lower Body Dressing: with modified independence;sit to/from stand Pt Will Perform Toileting - Clothing Manipulation and hygiene: with modified independence;sit to/from stand Additional ADL Goal #1: Ptwill be modified independence with OOB ADL with fair balance  OT Frequency: Min 2X/week   Barriers to D/C:            Co-evaluation              AM-PAC OT "6 Clicks" Daily Activity     Outcome Measure Help from another person eating meals?: None Help from another person taking care of personal grooming?: A Little Help from another person toileting, which includes using toliet, bedpan, or urinal?: A Little Help from another person bathing (including washing, rinsing, drying)?: A Little Help from another person to put on and taking off regular upper body clothing?: A Little Help from another person to put on and taking off regular lower body clothing?: A Little 6 Click Score: 19   End of Session Equipment Utilized During Treatment: Gait belt;Rolling walker Nurse Communication: Mobility status  Activity Tolerance: Patient tolerated treatment well Patient left: in chair;with call bell/phone within reach;with chair alarm set  OT Visit Diagnosis: Unsteadiness on feet (R26.81);Muscle weakness (generalized) (M62.81)                Time: 6948-5462 OT Time Calculation (min): 34 min Charges:  OT General Charges $OT Visit: 1 Visit OT Evaluation $OT Eval  Moderate Complexity: 1 Mod OT Treatments $Self Care/Home Management : 8-22 mins  Darryl Nestle) Marsa Aris OTR/L Acute Rehabilitation Services Pager: [phone] Office: [phone]   Audie Pinto 10/19/2018, 8:53 AM

## 2018-10-19 NOTE — Progress Notes (Signed)
Physical Therapy Treatment Patient Details Name: [name] MRN: [id number] DOB: 1925-08-06 Today's Date: 10/19/2018    History of Present Illness 85 y.o. admitted on 10/17/18 after being found down on the floor at her independent living apartment.  CT head and c-spine were negative, MRI negative, EEG showing cortical irritability.  Dx with acute encephaolpathy and generalized weakness, medical workup continues.  Mild rhabdomyolysis due to fall, rapid rate a-fib (with h/o A-fib).  Pt with other significant PMH of HTN, chronic back pain, breast CA, bil TKA, R mastectomy, and shoulder surgery.    PT Comments    Pt lethargic at first, so turned on lights and got her mobilizing which made her perk up.  I assisted her daughter and the pt in getting her dressed for d/c.  We did some self care tasks at the sink looking at her standing balance and tolerance of standing, and then we walked a short distance down the hallway with a 4 wheeled RW which is what she uses at home. Overall, she needs min to min guard assist and her daughter has arranged for 24 hour care for the first little bit of time once she returns home.  PT to follow acutely until d/c confirmed.     Follow Up Recommendations  Home health PT;Supervision for mobility/OOB     Equipment Recommendations  None recommended by PT    Recommendations for Other Services   NA     Precautions / Restrictions Precautions Precautions: Fall    Mobility  Bed Mobility Overal bed mobility: Needs Assistance Bed Mobility: Supine to Sit     Supine to sit: Min guard     General bed mobility comments: Min guard assist with therapist attempting to let pt struggle through getting to the EOB on her own.   Transfers Overall transfer level: Needs assistance Equipment used: Rolling walker (2 wheeled) Transfers: Sit to/from Stand Sit to Stand: Min assist;Min guard         General transfer comment: Pt fluctuated from min to min guard assist with  sit to stand transitions, initally min and then transitioning to min guard with return to min at the end of the session due to fatigue.   Ambulation/Gait Ambulation/Gait assistance: Min guard Gait Distance (Feet): 45 Feet Assistive device: 4-wheeled walker Gait Pattern/deviations: Step-through pattern;Trunk flexed     General Gait Details: Pt was able to walk with a rollator (he usual device) down the hallway a bit, but was self limiting due to back pain and fatigue.  She pushes the walker too far forward causing increased trunk flexion which she can correct with cues, but reverts back within a few seconds (this is likely her premorbid gait pattern).            Balance Overall balance assessment: Needs assistance Sitting-balance support: Feet supported;No upper extremity supported Sitting balance-Leahy Scale: Good Sitting balance - Comments: Pt was able to reach down and doff socks today, but daughter assisted in donning her knee high panty hose.    Standing balance support: Bilateral upper extremity supported;Single extremity supported Standing balance-Leahy Scale: Fair Standing balance comment: close supervision at sink, pt leaning trunk on sink for stability, but was able to lift both hands up to brush her hair.                             Cognition Arousal/Alertness: Lethargic(initally, but woke up the more we moved) Behavior During Therapy: [facility]  for tasks assessed/performed Overall Cognitive Status: History of cognitive impairments - at baseline                                 General Comments: Much closer to baseline today compared to yesterday, so improving.              Pertinent Vitals/Pain Pain Assessment: Faces Faces Pain Scale: Hurts even more Pain Location: left side low back Pain Descriptors / Indicators: Grimacing;Guarding Pain Intervention(s): Limited activity within patient's tolerance;Monitored during session;Repositioned            PT Goals (current goals can now be found in the care plan section) Acute Rehab PT Goals Patient Stated Goal: to go home today Progress towards PT goals: Progressing toward goals    Frequency    Min 3X/week      PT Plan Current plan remains appropriate       AM-PAC PT "6 Clicks" Mobility   Outcome Measure  Help needed turning from your back to your side while in a flat bed without using bedrails?: A Little Help needed moving from lying on your back to sitting on the side of a flat bed without using bedrails?: A Little Help needed moving to and from a bed to a chair (including a wheelchair)?: A Little Help needed standing up from a chair using your arms (e.g., wheelchair or bedside chair)?: A Little Help needed to walk in hospital room?: A Little Help needed climbing 3-5 steps with a railing? : A Lot 6 Click Score: 17    End of Session Equipment Utilized During Treatment: Gait belt Activity Tolerance: Patient limited by pain;Patient limited by fatigue Patient left: in chair;with family/visitor present   PT Visit Diagnosis: Muscle weakness (generalized) (M62.81);Difficulty in walking, not elsewhere classified (R26.2);Pain Pain - Right/Left: Right Pain - part of body: Shoulder     Time: 0174-9449 PT Time Calculation (min) (ACUTE ONLY): 39 min  Charges:  $Gait Training: 8-22 mins $Therapeutic Activity: 23-37 mins                    Emmagrace Runkel B. Montez Cuda, PT, DPT  Acute Rehabilitation [phone] pager [phone] office  @ [facility]: [phone]   10/19/2018, 2:57 PM

## 2018-10-21 ENCOUNTER — Other Ambulatory Visit: Payer: Self-pay | Admitting: Cardiovascular Disease

## 2018-10-21 DIAGNOSIS — I4891 Unspecified atrial fibrillation: Secondary | ICD-10-CM | POA: Diagnosis not present

## 2018-10-21 DIAGNOSIS — M6282 Rhabdomyolysis: Secondary | ICD-10-CM | POA: Diagnosis not present

## 2018-10-21 DIAGNOSIS — R945 Abnormal results of liver function studies: Secondary | ICD-10-CM | POA: Diagnosis not present

## 2018-10-21 DIAGNOSIS — Z7189 Other specified counseling: Secondary | ICD-10-CM | POA: Diagnosis not present

## 2018-10-21 DIAGNOSIS — I10 Essential (primary) hypertension: Secondary | ICD-10-CM | POA: Diagnosis not present

## 2018-10-21 DIAGNOSIS — G9341 Metabolic encephalopathy: Secondary | ICD-10-CM | POA: Diagnosis not present

## 2018-10-27 DIAGNOSIS — R488 Other symbolic dysfunctions: Secondary | ICD-10-CM | POA: Diagnosis not present

## 2018-10-27 DIAGNOSIS — R278 Other lack of coordination: Secondary | ICD-10-CM | POA: Diagnosis not present

## 2018-10-27 DIAGNOSIS — M6281 Muscle weakness (generalized): Secondary | ICD-10-CM | POA: Diagnosis not present

## 2018-10-28 ENCOUNTER — Other Ambulatory Visit: Payer: Self-pay | Admitting: Cardiovascular Disease

## 2018-10-29 ENCOUNTER — Other Ambulatory Visit: Payer: Self-pay

## 2018-10-29 ENCOUNTER — Emergency Department (HOSPITAL_COMMUNITY): Payer: Medicare HMO

## 2018-10-29 ENCOUNTER — Emergency Department (HOSPITAL_COMMUNITY)
Admission: EM | Admit: 2018-10-29 | Discharge: 2018-10-29 | Disposition: A | Discharge status: Home / Self Care | Payer: Medicare HMO | Source: Home / Self Care | Attending: Emergency Medicine | Admitting: Emergency Medicine

## 2018-10-29 ENCOUNTER — Encounter (HOSPITAL_COMMUNITY): Payer: Self-pay | Admitting: Emergency Medicine

## 2018-10-29 DIAGNOSIS — Z03818 Encounter for observation for suspected exposure to other biological agents ruled out: Secondary | ICD-10-CM | POA: Diagnosis not present

## 2018-10-29 DIAGNOSIS — I10 Essential (primary) hypertension: Secondary | ICD-10-CM | POA: Insufficient documentation

## 2018-10-29 DIAGNOSIS — Z853 Personal history of malignant neoplasm of breast: Secondary | ICD-10-CM | POA: Insufficient documentation

## 2018-10-29 DIAGNOSIS — K5730 Diverticulosis of large intestine without perforation or abscess without bleeding: Secondary | ICD-10-CM | POA: Diagnosis not present

## 2018-10-29 DIAGNOSIS — I482 Chronic atrial fibrillation, unspecified: Secondary | ICD-10-CM | POA: Diagnosis not present

## 2018-10-29 DIAGNOSIS — E860 Dehydration: Secondary | ICD-10-CM | POA: Diagnosis not present

## 2018-10-29 DIAGNOSIS — R1013 Epigastric pain: Secondary | ICD-10-CM | POA: Diagnosis not present

## 2018-10-29 DIAGNOSIS — R109 Unspecified abdominal pain: Secondary | ICD-10-CM | POA: Insufficient documentation

## 2018-10-29 DIAGNOSIS — K219 Gastro-esophageal reflux disease without esophagitis: Secondary | ICD-10-CM | POA: Diagnosis not present

## 2018-10-29 DIAGNOSIS — K449 Diaphragmatic hernia without obstruction or gangrene: Secondary | ICD-10-CM | POA: Diagnosis not present

## 2018-10-29 DIAGNOSIS — Z9181 History of falling: Secondary | ICD-10-CM | POA: Diagnosis not present

## 2018-10-29 DIAGNOSIS — R1110 Vomiting, unspecified: Secondary | ICD-10-CM | POA: Diagnosis not present

## 2018-10-29 DIAGNOSIS — J9811 Atelectasis: Secondary | ICD-10-CM | POA: Diagnosis not present

## 2018-10-29 DIAGNOSIS — R1084 Generalized abdominal pain: Secondary | ICD-10-CM | POA: Diagnosis not present

## 2018-10-29 DIAGNOSIS — K8000 Calculus of gallbladder with acute cholecystitis without obstruction: Secondary | ICD-10-CM | POA: Diagnosis not present

## 2018-10-29 DIAGNOSIS — K810 Acute cholecystitis: Secondary | ICD-10-CM | POA: Diagnosis not present

## 2018-10-29 DIAGNOSIS — R1011 Right upper quadrant pain: Secondary | ICD-10-CM | POA: Diagnosis not present

## 2018-10-29 DIAGNOSIS — E785 Hyperlipidemia, unspecified: Secondary | ICD-10-CM | POA: Diagnosis not present

## 2018-10-29 DIAGNOSIS — G8929 Other chronic pain: Secondary | ICD-10-CM | POA: Diagnosis not present

## 2018-10-29 DIAGNOSIS — M4690 Unspecified inflammatory spondylopathy, site unspecified: Secondary | ICD-10-CM | POA: Diagnosis present

## 2018-10-29 DIAGNOSIS — Z9011 Acquired absence of right breast and nipple: Secondary | ICD-10-CM | POA: Insufficient documentation

## 2018-10-29 DIAGNOSIS — Z79899 Other long term (current) drug therapy: Secondary | ICD-10-CM | POA: Diagnosis not present

## 2018-10-29 DIAGNOSIS — Z20828 Contact with and (suspected) exposure to other viral communicable diseases: Secondary | ICD-10-CM | POA: Diagnosis not present

## 2018-10-29 DIAGNOSIS — K760 Fatty (change of) liver, not elsewhere classified: Secondary | ICD-10-CM | POA: Diagnosis not present

## 2018-10-29 DIAGNOSIS — Z7189 Other specified counseling: Secondary | ICD-10-CM | POA: Diagnosis not present

## 2018-10-29 DIAGNOSIS — K8010 Calculus of gallbladder with chronic cholecystitis without obstruction: Secondary | ICD-10-CM | POA: Diagnosis not present

## 2018-10-29 DIAGNOSIS — Z96653 Presence of artificial knee joint, bilateral: Secondary | ICD-10-CM | POA: Insufficient documentation

## 2018-10-29 DIAGNOSIS — Z515 Encounter for palliative care: Secondary | ICD-10-CM | POA: Diagnosis not present

## 2018-10-29 DIAGNOSIS — Z888 Allergy status to other drugs, medicaments and biological substances status: Secondary | ICD-10-CM | POA: Diagnosis not present

## 2018-10-29 DIAGNOSIS — R079 Chest pain, unspecified: Secondary | ICD-10-CM | POA: Diagnosis not present

## 2018-10-29 DIAGNOSIS — Z66 Do not resuscitate: Secondary | ICD-10-CM | POA: Diagnosis not present

## 2018-10-29 DIAGNOSIS — E871 Hypo-osmolality and hyponatremia: Secondary | ICD-10-CM | POA: Diagnosis not present

## 2018-10-29 DIAGNOSIS — R112 Nausea with vomiting, unspecified: Secondary | ICD-10-CM | POA: Insufficient documentation

## 2018-10-29 DIAGNOSIS — M549 Dorsalgia, unspecified: Secondary | ICD-10-CM | POA: Diagnosis present

## 2018-10-29 DIAGNOSIS — Z87891 Personal history of nicotine dependence: Secondary | ICD-10-CM | POA: Diagnosis not present

## 2018-10-29 DIAGNOSIS — K8020 Calculus of gallbladder without cholecystitis without obstruction: Secondary | ICD-10-CM | POA: Diagnosis not present

## 2018-10-29 DIAGNOSIS — F329 Major depressive disorder, single episode, unspecified: Secondary | ICD-10-CM | POA: Diagnosis present

## 2018-10-29 DIAGNOSIS — F0390 Unspecified dementia without behavioral disturbance: Secondary | ICD-10-CM | POA: Diagnosis not present

## 2018-10-29 DIAGNOSIS — K5900 Constipation, unspecified: Secondary | ICD-10-CM | POA: Diagnosis present

## 2018-10-29 DIAGNOSIS — R633 Feeding difficulties: Secondary | ICD-10-CM | POA: Diagnosis not present

## 2018-10-29 DIAGNOSIS — R739 Hyperglycemia, unspecified: Secondary | ICD-10-CM | POA: Diagnosis present

## 2018-10-29 DIAGNOSIS — R933 Abnormal findings on diagnostic imaging of other parts of digestive tract: Secondary | ICD-10-CM | POA: Diagnosis not present

## 2018-10-29 DIAGNOSIS — M545 Low back pain: Secondary | ICD-10-CM | POA: Diagnosis not present

## 2018-10-29 DIAGNOSIS — Z7901 Long term (current) use of anticoagulants: Secondary | ICD-10-CM | POA: Diagnosis not present

## 2018-10-29 LAB — COMPREHENSIVE METABOLIC PANEL
ALT: 26 U/L (ref 0–44)
AST: 23 U/L (ref 15–41)
Albumin: 3.8 g/dL (ref 3.5–5.0)
Alkaline Phosphatase: 69 U/L (ref 38–126)
Anion gap: 13 (ref 5–15)
BUN: 8 mg/dL (ref 8–23)
CO2: 24 mmol/L (ref 22–32)
Calcium: 9.6 mg/dL (ref 8.9–10.3)
Chloride: 98 mmol/L (ref 98–111)
Creatinine, Ser: 0.71 mg/dL (ref 0.44–1.00)
GFR calc Af Amer: >60 mL/min (ref >60)
GFR calc non Af Amer: >60 mL/min (ref >60)
Glucose, Bld: 178 mg/dL — ABNORMAL HIGH (ref 70–99)
Potassium: 4.2 mmol/L (ref 3.5–5.1)
Sodium: 135 mmol/L (ref 135–145)
Total Bilirubin: 0.7 mg/dL (ref 0.3–1.2)
Total Protein: 7.4 g/dL (ref 6.5–8.1)

## 2018-10-29 LAB — LIPASE, BLOOD: Lipase: 37 U/L (ref 11–51)

## 2018-10-29 LAB — CBC
HCT: 46.4 % — ABNORMAL HIGH (ref 36.0–46.0)
Hemoglobin: 15.3 g/dL — ABNORMAL HIGH (ref 12.0–15.0)
MCH: 31.6 pg (ref 26.0–34.0)
MCHC: 33 g/dL (ref 30.0–36.0)
MCV: 95.9 fL (ref 80.0–100.0)
Platelets: 353 10*3/uL (ref 150–400)
RBC: 4.84 MIL/uL (ref 3.87–5.11)
RDW: 12.2 % (ref 11.5–15.5)
WBC: 16.5 10*3/uL — ABNORMAL HIGH (ref 4.0–10.5)
nRBC: 0 % (ref 0.0–0.2)

## 2018-10-29 MED ORDER — ONDANSETRON HCL 4 MG PO TABS: 4.0000 mg | ORAL_TABLET | Freq: Three times a day (TID) | ORAL | 0 refills | Status: AC | PRN

## 2018-10-29 MED ORDER — ACETAMINOPHEN 325 MG PO TABS
650.0000 mg | ORAL_TABLET | Freq: Once | ORAL | Status: AC
  Administered 2018-10-29: 650 mg via ORAL
  Filled 2018-10-29: qty 2

## 2018-10-29 MED ORDER — IOHEXOL 300 MG/ML  SOLN
100.0000 mL | Freq: Once | INTRAMUSCULAR | Status: AC | PRN
  Administered 2018-10-29: 100 mL via INTRAVENOUS

## 2018-10-29 MED ORDER — ONDANSETRON 4 MG PO TBDP
4.0000 mg | ORAL_TABLET | Freq: Once | ORAL | Status: AC
  Administered 2018-10-29: 4 mg via ORAL
  Filled 2018-10-29: qty 1

## 2018-10-29 MED ORDER — LACTATED RINGERS IV BOLUS
1000.0000 mL | Freq: Once | INTRAVENOUS | Status: AC
  Administered 2018-10-29: 1000 mL via INTRAVENOUS

## 2018-10-29 MED ORDER — SODIUM CHLORIDE 0.9% FLUSH: 3.0000 mL | Freq: Once | INTRAVENOUS | Status: DC

## 2018-10-29 NOTE — ED Notes (Signed)
Patient transported to X-ray 

## 2018-10-29 NOTE — ED Provider Notes (Signed)
[facility] EMERGENCY DEPARTMENT Provider Note   CSN: [id number] Arrival date & time: 10/29/18  1449    History   Chief Complaint Chief Complaint  Patient presents with   Abdominal Pain    HPI [name] is a 85 y.o. female.     Patient was recently discharged from the hospital 10 days ago for a fall and acute encephalitis.  Since that time the patient has been staying with her daughter.  Patient's other daughter has been with her for the past 2 days.  She states that the patient has been complaining of stomach pain since she was discharged from the hospital.  The daughter has been giving her MiraLAX since Thursday.  The daughter states that he has been reported that the patient has had bowel movements, but the patient complains that she is constipated  Patient was recently increased on her diltiazem last Thursday, and the daughter wonders if this has some to do with her pain.  The daughter states that she thought the patient was doing better with her eating, until 2 days ago.  Then she started noticing that she was eating less.  Also states that the patient was vomiting this morning.  She vomited twice, it was clear.  Patient called her physician, who sent her to the gastroenterologist, and the gastroenterologist advised her to come to the hospital for scan.  Daughter states that the patient had gallstones when they examined her the last time she was in the hospital.  She states that the patient has had a 20 pound weight loss since January.  Daughter states that at baseline the patient has dementia, but that it has been getting worse since her fall.  Her physician recently took her off a lot of medications, but then restarted her on Zoloft because the patient was paranoid that somebody was trying to kill her.  This was yesterday.  Patient takes her diltiazem and Eliquis daily.     Past Medical History:  Diagnosis Date   Arthritis    low spine   Breast cancer  ([place]) 2013   right breast   Breast cancer, right breast, recurrent 06/03/2011   Cancer ([place]) 1989   breast right   Chronic back pain    Hypertension     Patient Active Problem List   Diagnosis Date Noted   Atrial fibrillation, chronic 10/18/2018   Gallstones 10/18/2018   Acute encephalopathy 10/17/2018   Atrial fibrillation, new onset ([place]) 09/13/2016   Chronic anticoagulation 09/13/2016   Essential hypertension 09/13/2016   Chest pain with moderate risk of acute coronary syndrome 09/12/2016   Breast cancer, right breast, recurrent 06/03/2011   Personal history of malignant neoplasm of breast 04/14/1987    Past Surgical History:  Procedure Laterality Date   [place]   rt lumpectomy-axillary dissection   COLONOSCOPY     EYE SURGERY     both cataracts   JOINT REPLACEMENT  2004, 2005   both knees    MASTECTOMY Right 06/11/11   right breast   SHOULDER SURGERY     patient does not remember date of procedure     OB History   No obstetric history on file.      Home Medications    Prior to Admission medications   Medication Sig Start Date End Date Taking? Authorizing Provider  acetaminophen (TYLENOL) 500 MG tablet Take 500-1,000 mg by mouth every 8 (eight) hours as needed (for  arthritis pain).    [provider]  atorvastatin (LIPITOR) 20 MG tablet TAKE 1 TABLET BY MOUTH EVERY DAY Patient taking differently: Take 20 mg by mouth daily.  09/21/17   Croitoru, Mihai, MD  calcium carbonate (OS-CAL) 600 MG TABS Take 600 mg by mouth daily.    [provider]  Cholecalciferol (VITAMIN D3) 1000 UNITS CAPS Take 1,000 Units by mouth daily.     [provider]  diltiazem (CARDIZEM CD) 180 MG 24 hr capsule Take 1 capsule (180 mg total) by mouth daily. 10/19/18 12/18/18  Amin, Ankit Chirag, MD  ELIQUIS 5 MG TABS tablet TAKE 1 TABLET BY MOUTH TWICE A DAY Patient taking differently: Take 5 mg by mouth 2  (two) times daily.  07/14/18   Croitoru, Mihai, MD  gabapentin (NEURONTIN) 100 MG capsule Take 100 mg by mouth at bedtime.  04/03/11   [provider]  HYDROcodone-acetaminophen (NORCO/VICODIN) 5-325 MG tablet Take 1 tablet by mouth every 8 (eight) hours as needed for pain. 01/25/18   [provider]  losartan (COZAAR) 25 MG tablet TAKE 2 TABLETS BY MOUTH (50MG  TOTAL) EVERY DAY PT NEEDS OFFICE VISIT 10/28/18   Erlene Quan, PA-C  Multiple Vitamins-Minerals (CENTRUM SILVER 50+WOMEN PO) Take 1 tablet by mouth daily with breakfast.    [provider]  nitroGLYCERIN (NITROSTAT) 0.4 MG SL tablet Place 1 tablet (0.4 mg total) under the tongue every 5 (five) minutes x 3 doses as needed for chest pain. 09/13/16   Erlene Quan, PA-C  ondansetron (ZOFRAN) 4 MG tablet Take 1 tablet (4 mg total) by mouth every 8 (eight) hours as needed for nausea or vomiting. 10/29/18   Benay Pike, MD  predniSONE (DELTASONE) 2.5 MG tablet Take 2.5 mg by mouth daily. 12/27/17   [provider]  RABEprazole (ACIPHEX) 20 MG tablet TAKE 1 TABLET BY MOUTH EVERY DAY Patient taking differently: Take 20 mg by mouth at bedtime.  09/21/17   Croitoru, Mihai, MD  sertraline (ZOLOFT) 50 MG tablet Take 50 mg by mouth every morning.  02/04/18   [provider]  vitamin C (ASCORBIC ACID) 500 MG tablet Take 500 mg by mouth daily.    [provider]  VITAMIN E PO Take 1,000 Units by mouth daily.     [provider]    Family History Family History  Problem Relation Age of Onset   Heart disease Mother    Breast cancer Maternal Aunt    Cancer Brother        unaware   Heart disease Brother     Social History Social History   Tobacco Use   Smoking status: Former Smoker    Quit date: 05/11/1970    Years since quitting: 48.5   Smokeless tobacco: Never Used  Substance Use Topics   Alcohol use: No   Drug use: No     Allergies   Arimidex [anastrozole], Fish  allergy, Morphine and related, Shellfish allergy, and Vicodin [hydrocodone-acetaminophen]   Review of Systems Review of Systems  Constitutional: Positive for appetite change and chills. Negative for fever.  HENT: Negative for sore throat.   Respiratory: Negative for cough and shortness of breath.   Cardiovascular: Negative for chest pain.  Gastrointestinal: Positive for abdominal pain, constipation, nausea and vomiting. Negative for blood in stool and diarrhea.  Genitourinary: Negative for dysuria.  Neurological: Positive for headaches. Negative for dizziness.  Psychiatric/Behavioral: Positive for confusion.     Physical Exam Updated Vital Signs BP Marland Kitchen)  189/95    Pulse 90    Temp 98.1 F (36.7 C) (Oral)    Resp (!) 23    SpO2 95%   Physical Exam Constitutional:      General: She is not in acute distress.    Appearance: She is not ill-appearing.  HENT:     Head: Normocephalic and atraumatic.     Mouth/Throat:     Mouth: Mucous membranes are moist.     Pharynx: Oropharynx is clear. No pharyngeal swelling.  Eyes:     General: No scleral icterus.    Extraocular Movements: Extraocular movements intact.     Pupils: Pupils are equal, round, and reactive to light.  Cardiovascular:     Rate and Rhythm: Normal rate. Rhythm irregular.     Heart sounds: No murmur.  Pulmonary:     Effort: Pulmonary effort is normal. No respiratory distress.     Breath sounds: Normal breath sounds. No wheezing or rhonchi.  Abdominal:     General: Abdomen is flat. Bowel sounds are decreased.     Palpations: Abdomen is soft.     Tenderness: There is generalized abdominal tenderness. There is no guarding. Negative signs include Murphy's sign.  Skin:    General: Skin is warm and dry.  Neurological:     General: No focal deficit present.     Mental Status: She is alert.     Cranial Nerves: No cranial nerve deficit.  Psychiatric:        Mood and Affect: Mood normal.        Behavior: Behavior normal.       ED Treatments / Results  Labs (all labs ordered are listed, but only abnormal results are displayed) Labs Reviewed  COMPREHENSIVE METABOLIC PANEL - Abnormal; Notable for the following components:      Result Value   Glucose, Bld 178 (*)    All other components within normal limits  CBC - Abnormal; Notable for the following components:   WBC 16.5 (*)    Hemoglobin 15.3 (*)    HCT 46.4 (*)    All other components within normal limits  LIPASE, BLOOD    EKG None  Radiology Dg Abdomen 1 View  Result Date: 10/29/2018 CLINICAL DATA:  85 year old with generalized abdominal pain, nausea and vomiting. EXAM: ABDOMEN - 1 VIEW COMPARISON:  None. FINDINGS: Bowel gas pattern unremarkable without evidence of obstruction or significant ileus. Expected stool burden in the colon. No visible opaque urinary tract calculi. Aortic atherosclerosis without aneurysm. Degenerative changes involving the lower thoracic and lumbar spine and both hips. IMPRESSION: No acute abdominal abnormality. Electronically Signed   By: Evangeline Dakin M.D.   On: 10/29/2018 18:10   Ct Abdomen Pelvis W Contrast  Result Date: 10/29/2018 CLINICAL DATA:  Abdominal pain, nausea and vomiting. EXAM: CT ABDOMEN AND PELVIS WITH CONTRAST TECHNIQUE: Multidetector CT imaging of the abdomen and pelvis was performed using the standard protocol following bolus administration of intravenous contrast. CONTRAST:  167mL OMNIPAQUE IOHEXOL 300 MG/ML  SOLN COMPARISON:  Abdomen radiographs obtained earlier today. Abdomen ultrasound dated 09/21/2008. Limited right upper quadrant abdomen ultrasound dated 10/17/2018. FINDINGS: Lower chest: Moderately large hiatal hernia. Minimal left basilar atelectasis/scarring. Mildly enlarged heart. Postmastectomy changes on the right. Hepatobiliary: Mild diffuse low density of the liver relative to the spleen. Normal appearing gallbladder. Pancreas: Unremarkable. No pancreatic ductal dilatation or surrounding  inflammatory changes. Spleen: Normal in size without focal abnormality. Adrenals/Urinary Tract: Mild bilateral adrenal thickening without discrete masses period small bilateral renal  cysts. Unremarkable ureters and urinary bladder. Stomach/Bowel: Moderately large hiatal hernia. Multiple colonic diverticula without evidence of diverticulitis. Unremarkable small bowel. No evidence of appendicitis. Vascular/Lymphatic: Atheromatous arterial calcifications, including the coronary arteries. No aneurysm or enlarged lymph nodes. Reproductive: Status post hysterectomy. No adnexal masses. Other: Very tiny umbilical hernia containing fat. No abdominopelvic ascites. Musculoskeletal: Extensive lumbar and lower thoracic spine degenerative changes with mild to moderate scoliosis. IMPRESSION: 1. No acute abnormality. 2. Moderately large hiatal hernia. 3. Colonic diverticulosis. 4. Mild diffuse hepatic steatosis. 5. Calcific coronary artery atherosclerosis. Electronically Signed   By: Claudie Revering M.D.   On: 10/29/2018 20:50   US Abdomen Limited Ruq  Result Date: 10/29/2018 CLINICAL DATA:  Abdominal pain EXAM: ULTRASOUND ABDOMEN LIMITED RIGHT UPPER QUADRANT COMPARISON:  Ultrasound dated October 17, 2018 FINDINGS: Gallbladder: Multiple gallstones are noted measuring up to approximately 8 mm. There is no gallbladder wall thickening. The sonographic Percell Miller sign is reported as negative. There is no pericholecystic free fluid. Common bile duct: Diameter: 0.5 cm Liver: No focal lesion identified. The left hepatic lobe was suboptimally evaluated secondary to overlying bowel gas. Within normal limits in parenchymal echogenicity. Portal vein is patent on color Doppler imaging with normal direction of blood flow towards the liver. IMPRESSION: There is cholelithiasis without secondary signs of acute cholecystitis. Electronically Signed   By: Constance Holster M.D.   On: 10/29/2018 18:32    Procedures Procedures (including critical care  time)  Medications Ordered in ED Medications  ondansetron (ZOFRAN-ODT) disintegrating tablet 4 mg (4 mg Oral Given 10/29/18 1903)  lactated ringers bolus 1,000 mL (0 mLs Intravenous Stopped 10/29/18 2149)  iohexol (OMNIPAQUE) 300 MG/ML solution 100 mL (100 mLs Intravenous Contrast Given 10/29/18 2019)  acetaminophen (TYLENOL) tablet 650 mg (650 mg Oral Given 10/29/18 2207)     Initial Impression / Assessment and Plan / ED Course  I have reviewed the triage vital signs and the nursing notes.  Pertinent labs & imaging results that were available during my care of the patient were reviewed by me and considered in my medical decision making (see chart for details).        Patient is a 85 year old lady with a history of dementia and A. fib, with a recent discharge from the hospital 10 days ago for falls and acute metabolic encephalopathy who has been complaining of abdominal pain and constipation since her discharge.  Per the daughter patient has been having bowel movements but has had decreased appetite.  Daughter states that she thought this was improving, but got worse yesterday.  Patient had nonbloody nonbilious vomiting twice this morning.  Patient also noted to have gallstones on ultrasound, but HIDA scan negative during last admission.  In the ED the patient was afebrile, with normal vital signs except for blood pressure of 183/79.  CMP and lipase was normal, CBC was notable for leukocytosis of 16.5. With concern for constipation, a abdominal xray was ordered, but did not show any constipation or obstruction/ileus. RUQ u/s showed gallstones but no signs of cholecystitis.  CT abdomen was then ordered which did show diverticulosis without inflammation and a moderately large hiatal hernia. Differential for the patient's abdominal pain/vomiting are her hiatal hernia, cholelithiasis, or an infection.  Patient did have a large BM while in the ED but a sample was not obtained for a GI pathogen panel. The  patient's nausea improved with zofran and she was given 1L bolus LR.  After discussion with the patient and her daughter, the decision was made  to send the patient home with anti-emetic medication and told to follow up with a gastroenterologist or her PCP as soon as she was able.  Return precautions were discussed.  Patient was discharged home with daughter.   Final Clinical Impressions(s) / ED Diagnoses   Final diagnoses:  Abdominal pain  Non-intractable vomiting with nausea, unspecified vomiting type    ED Discharge Orders         Ordered    ondansetron (ZOFRAN) 4 MG tablet  Every 8 hours PRN     10/29/18 2146           Benay Pike, MD 10/30/18 [id number]    Isla Pence, MD 10/30/18 2247

## 2018-10-29 NOTE — Discharge Instructions (Signed)
Your stomach pain and vomiting is likely related to your gallstones or the hiatal hernia seen on CT scan, although this is not definitive.  You will need further work-up and a gastroenterologist will need to decide if your gallbladder needs to be removed.  After discussions you have decided to do this as an outpatient.  We have given you Zofran to help with nausea while you have this issue worked up.  If you are unable to tolerate solid foods, please try to supplement with nutritional shakes such as Glucerna.  Try to stay hydrated with plenty of fluids to avoid kidney injury.  If you are unable to keep down both solids and fluids even with the antinausea medication, this would be a reason to come back to the emergency department for further treatment.  Please call your primary care provider as soon as you can to schedule an appointment so that they can refer you to a gastroenterologist who can further treat this problem.  I have also provided the contact information for a gastroenterologist if you would like to contact them directly.

## 2018-10-29 NOTE — ED Notes (Signed)
Patient transported to CT 

## 2018-10-29 NOTE — ED Triage Notes (Signed)
Pt arrives from home with c/o abd pain, pts daughter states that patient was recently released from the hospital for afib. Pts daughter states that they found gallstones when patient was in the hospital. Pt began vomiting this a.m. and is more fatigued than normal.

## 2018-10-31 ENCOUNTER — Telehealth: Payer: Self-pay | Admitting: Internal Medicine

## 2018-10-31 NOTE — Telephone Encounter (Signed)
Spoke with patient's daughter [name] and after explaining Palliative services she was in agreement with this.  We have scheduled a Telehealth zoom Palliative Consult for 11/02/18 @ 12:30 PM

## 2018-11-01 ENCOUNTER — Inpatient Hospital Stay (HOSPITAL_COMMUNITY)
Admission: EM | Admit: 2018-11-01 | Discharge: 2018-11-10 | DRG: 392 | Disposition: A | Discharge status: Home Health | Payer: Medicare HMO | Attending: Family Medicine | Admitting: Family Medicine

## 2018-11-01 ENCOUNTER — Encounter (HOSPITAL_COMMUNITY): Payer: Self-pay

## 2018-11-01 ENCOUNTER — Emergency Department (HOSPITAL_COMMUNITY): Payer: Medicare HMO

## 2018-11-01 ENCOUNTER — Other Ambulatory Visit: Payer: Self-pay

## 2018-11-01 DIAGNOSIS — R1011 Right upper quadrant pain: Secondary | ICD-10-CM

## 2018-11-01 DIAGNOSIS — Z7901 Long term (current) use of anticoagulants: Secondary | ICD-10-CM

## 2018-11-01 DIAGNOSIS — R112 Nausea with vomiting, unspecified: Secondary | ICD-10-CM | POA: Diagnosis not present

## 2018-11-01 DIAGNOSIS — Z66 Do not resuscitate: Secondary | ICD-10-CM | POA: Diagnosis present

## 2018-11-01 DIAGNOSIS — Z9181 History of falling: Secondary | ICD-10-CM

## 2018-11-01 DIAGNOSIS — I482 Chronic atrial fibrillation, unspecified: Secondary | ICD-10-CM | POA: Diagnosis present

## 2018-11-01 DIAGNOSIS — R739 Hyperglycemia, unspecified: Secondary | ICD-10-CM | POA: Diagnosis present

## 2018-11-01 DIAGNOSIS — Z853 Personal history of malignant neoplasm of breast: Secondary | ICD-10-CM | POA: Diagnosis not present

## 2018-11-01 DIAGNOSIS — K810 Acute cholecystitis: Secondary | ICD-10-CM | POA: Diagnosis not present

## 2018-11-01 DIAGNOSIS — Z90710 Acquired absence of both cervix and uterus: Secondary | ICD-10-CM

## 2018-11-01 DIAGNOSIS — F329 Major depressive disorder, single episode, unspecified: Secondary | ICD-10-CM | POA: Diagnosis present

## 2018-11-01 DIAGNOSIS — Z888 Allergy status to other drugs, medicaments and biological substances status: Secondary | ICD-10-CM

## 2018-11-01 DIAGNOSIS — K5900 Constipation, unspecified: Secondary | ICD-10-CM | POA: Diagnosis present

## 2018-11-01 DIAGNOSIS — Z79899 Other long term (current) drug therapy: Secondary | ICD-10-CM | POA: Diagnosis not present

## 2018-11-01 DIAGNOSIS — Z20828 Contact with and (suspected) exposure to other viral communicable diseases: Secondary | ICD-10-CM | POA: Diagnosis present

## 2018-11-01 DIAGNOSIS — E860 Dehydration: Secondary | ICD-10-CM | POA: Diagnosis present

## 2018-11-01 DIAGNOSIS — Z7189 Other specified counseling: Secondary | ICD-10-CM

## 2018-11-01 DIAGNOSIS — Z96653 Presence of artificial knee joint, bilateral: Secondary | ICD-10-CM | POA: Diagnosis present

## 2018-11-01 DIAGNOSIS — R109 Unspecified abdominal pain: Secondary | ICD-10-CM | POA: Diagnosis not present

## 2018-11-01 DIAGNOSIS — M549 Dorsalgia, unspecified: Secondary | ICD-10-CM | POA: Diagnosis present

## 2018-11-01 DIAGNOSIS — R633 Feeding difficulties: Secondary | ICD-10-CM | POA: Diagnosis present

## 2018-11-01 DIAGNOSIS — Z803 Family history of malignant neoplasm of breast: Secondary | ICD-10-CM

## 2018-11-01 DIAGNOSIS — Z91013 Allergy to seafood: Secondary | ICD-10-CM

## 2018-11-01 DIAGNOSIS — F0390 Unspecified dementia without behavioral disturbance: Secondary | ICD-10-CM | POA: Diagnosis present

## 2018-11-01 DIAGNOSIS — R1013 Epigastric pain: Secondary | ICD-10-CM

## 2018-11-01 DIAGNOSIS — E871 Hypo-osmolality and hyponatremia: Secondary | ICD-10-CM | POA: Diagnosis present

## 2018-11-01 DIAGNOSIS — G8929 Other chronic pain: Secondary | ICD-10-CM | POA: Diagnosis present

## 2018-11-01 DIAGNOSIS — M4690 Unspecified inflammatory spondylopathy, site unspecified: Secondary | ICD-10-CM | POA: Diagnosis present

## 2018-11-01 DIAGNOSIS — K8010 Calculus of gallbladder with chronic cholecystitis without obstruction: Secondary | ICD-10-CM | POA: Diagnosis not present

## 2018-11-01 DIAGNOSIS — I10 Essential (primary) hypertension: Secondary | ICD-10-CM | POA: Diagnosis present

## 2018-11-01 DIAGNOSIS — K8000 Calculus of gallbladder with acute cholecystitis without obstruction: Secondary | ICD-10-CM | POA: Diagnosis not present

## 2018-11-01 DIAGNOSIS — K8020 Calculus of gallbladder without cholecystitis without obstruction: Secondary | ICD-10-CM | POA: Diagnosis present

## 2018-11-01 DIAGNOSIS — Z8249 Family history of ischemic heart disease and other diseases of the circulatory system: Secondary | ICD-10-CM

## 2018-11-01 DIAGNOSIS — Z515 Encounter for palliative care: Secondary | ICD-10-CM

## 2018-11-01 DIAGNOSIS — K449 Diaphragmatic hernia without obstruction or gangrene: Secondary | ICD-10-CM | POA: Diagnosis present

## 2018-11-01 DIAGNOSIS — E785 Hyperlipidemia, unspecified: Secondary | ICD-10-CM | POA: Diagnosis present

## 2018-11-01 DIAGNOSIS — Z87891 Personal history of nicotine dependence: Secondary | ICD-10-CM | POA: Diagnosis not present

## 2018-11-01 DIAGNOSIS — R0789 Other chest pain: Secondary | ICD-10-CM

## 2018-11-01 DIAGNOSIS — Z885 Allergy status to narcotic agent status: Secondary | ICD-10-CM

## 2018-11-01 DIAGNOSIS — Z9011 Acquired absence of right breast and nipple: Secondary | ICD-10-CM

## 2018-11-01 DIAGNOSIS — M545 Low back pain: Secondary | ICD-10-CM | POA: Diagnosis not present

## 2018-11-01 LAB — COMPREHENSIVE METABOLIC PANEL
ALT: 24 U/L (ref 0–44)
AST: 21 U/L (ref 15–41)
Albumin: 3.8 g/dL (ref 3.5–5.0)
Alkaline Phosphatase: 59 U/L (ref 38–126)
Anion gap: 13 (ref 5–15)
BUN: 20 mg/dL (ref 8–23)
CO2: 27 mmol/L (ref 22–32)
Calcium: 9.2 mg/dL (ref 8.9–10.3)
Chloride: 93 mmol/L — ABNORMAL LOW (ref 98–111)
Creatinine, Ser: 0.93 mg/dL (ref 0.44–1.00)
GFR calc Af Amer: >60 mL/min (ref >60)
GFR calc non Af Amer: 54 mL/min — ABNORMAL LOW (ref >60)
Glucose, Bld: 192 mg/dL — ABNORMAL HIGH (ref 70–99)
Potassium: 3.6 mmol/L (ref 3.5–5.1)
Sodium: 133 mmol/L — ABNORMAL LOW (ref 135–145)
Total Bilirubin: 0.8 mg/dL (ref 0.3–1.2)
Total Protein: 7.3 g/dL (ref 6.5–8.1)

## 2018-11-01 LAB — CBC
HCT: 45.1 % (ref 36.0–46.0)
Hemoglobin: 15 g/dL (ref 12.0–15.0)
MCH: 31.4 pg (ref 26.0–34.0)
MCHC: 33.3 g/dL (ref 30.0–36.0)
MCV: 94.4 fL (ref 80.0–100.0)
Platelets: 422 10*3/uL — ABNORMAL HIGH (ref 150–400)
RBC: 4.78 MIL/uL (ref 3.87–5.11)
RDW: 12.4 % (ref 11.5–15.5)
WBC: 17.5 10*3/uL — ABNORMAL HIGH (ref 4.0–10.5)
nRBC: 0 % (ref 0.0–0.2)

## 2018-11-01 LAB — LIPASE, BLOOD: Lipase: 40 U/L (ref 11–51)

## 2018-11-01 LAB — SARS CORONAVIRUS 2 BY RT PCR (HOSPITAL ORDER, PERFORMED IN ~~LOC~~ HOSPITAL LAB): SARS Coronavirus 2: NEGATIVE

## 2018-11-01 MED ORDER — SODIUM CHLORIDE 0.9% FLUSH: 3.0000 mL | Freq: Once | INTRAVENOUS | Status: DC

## 2018-11-01 MED ORDER — ONDANSETRON HCL 4 MG/2ML IJ SOLN
4.0000 mg | Freq: Once | INTRAMUSCULAR | Status: AC
  Administered 2018-11-01: 4 mg via INTRAVENOUS
  Filled 2018-11-01: qty 2

## 2018-11-01 MED ORDER — SODIUM CHLORIDE 0.9 % IV BOLUS
500.0000 mL | Freq: Once | INTRAVENOUS | Status: AC
  Administered 2018-11-01: 500 mL via INTRAVENOUS

## 2018-11-01 MED ORDER — FENTANYL CITRATE (PF) 100 MCG/2ML IJ SOLN
50.0000 ug | Freq: Once | INTRAMUSCULAR | Status: AC
  Administered 2018-11-01: 50 ug via INTRAVENOUS
  Filled 2018-11-01: qty 2

## 2018-11-01 MED ORDER — ENOXAPARIN SODIUM 40 MG/0.4ML ~~LOC~~ SOLN: 40.0000 mg | SUBCUTANEOUS | Status: DC

## 2018-11-01 NOTE — ED Provider Notes (Signed)
[facility] DEPT Provider Note   CSN: [id number] Arrival date & time: 11/01/18  1606    History   Chief Complaint Chief Complaint  Patient presents with  . Abdominal Pain  . Emesis    HPI [name] is a 85 y.o. female.     Pt presents to the ED today with continued abdominal pain and n/v.  Pt was admitted from 7/6-7/8 for a fall.  While there, she was found to have gallstones.  She came back to the ED on 7/18.  I saw her at [facility] ED.  I rechecked a gb US and Ct abd/pelvis.  She had gallstones, but no signs of cholecystitis.  Pt is demented and is unable to give a hx.  I spoke with her daughter who said pt has not been able to keep anything down since she was d/c on the 18th.  Pt has not had a fever.     Past Medical History:  Diagnosis Date  . Arthritis    low spine  . Breast cancer [facility]) 2013   right breast  . Breast cancer, right breast, recurrent 06/03/2011  . Cancer [facility]) 1989   breast right  . Chronic back pain   . Hypertension     Patient Active Problem List   Diagnosis Date Noted  . Atrial fibrillation, chronic 10/18/2018  . Gallstones 10/18/2018  . Acute encephalopathy 10/17/2018  . Atrial fibrillation, new onset ([place]) 09/13/2016  . Chronic anticoagulation 09/13/2016  . Essential hypertension 09/13/2016  . Chest pain with moderate risk of acute coronary syndrome 09/12/2016  . Breast cancer, right breast, recurrent 06/03/2011  . Personal history of malignant neoplasm of breast 04/14/1987    Past Surgical History:  Procedure Laterality Date  . ABDOMINAL HYSTERECTOMY  1974  . BREAST SURGERY  1989   rt lumpectomy-axillary dissection  . COLONOSCOPY    . EYE SURGERY     both cataracts  . JOINT REPLACEMENT  2004, 2005   both knees   . MASTECTOMY Right 06/11/11   right breast  . SHOULDER SURGERY     patient does not remember date of procedure     OB History   No obstetric history on file.      Home Medications     Prior to Admission medications   Medication Sig Start Date End Date Taking? Authorizing Provider  acetaminophen (TYLENOL) 500 MG tablet Take 500-1,000 mg by mouth every 8 (eight) hours as needed (for arthritis pain).    [provider]  atorvastatin (LIPITOR) 20 MG tablet TAKE 1 TABLET BY MOUTH EVERY DAY Patient taking differently: Take 20 mg by mouth daily.  09/21/17   Croitoru, Mihai, MD  calcium carbonate (OS-CAL) 600 MG TABS Take 600 mg by mouth daily.    [provider]  Cholecalciferol (VITAMIN D3) 1000 UNITS CAPS Take 1,000 Units by mouth daily.     [provider]  diltiazem (CARDIZEM CD) 180 MG 24 hr capsule Take 1 capsule (180 mg total) by mouth daily. 10/19/18 12/18/18  Amin, Ankit Chirag, MD  ELIQUIS 5 MG TABS tablet TAKE 1 TABLET BY MOUTH TWICE A DAY Patient taking differently: Take 5 mg by mouth 2 (two) times daily.  07/14/18   Croitoru, Mihai, MD  gabapentin (NEURONTIN) 100 MG capsule Take 100 mg by mouth at bedtime.  04/03/11   [provider]  HYDROcodone-acetaminophen (NORCO/VICODIN) 5-325 MG tablet Take 1 tablet by mouth every 8 (eight) hours as needed for pain. 01/25/18  [provider]  losartan (COZAAR) 25 MG tablet TAKE 2 TABLETS BY MOUTH (50MG  TOTAL) EVERY DAY PT NEEDS OFFICE VISIT 10/28/18   Erlene Quan, PA-C  Multiple Vitamins-Minerals (CENTRUM SILVER 50+WOMEN PO) Take 1 tablet by mouth daily with breakfast.    [provider]  nitroGLYCERIN (NITROSTAT) 0.4 MG SL tablet Place 1 tablet (0.4 mg total) under the tongue every 5 (five) minutes x 3 doses as needed for chest pain. 09/13/16   Erlene Quan, PA-C  ondansetron (ZOFRAN) 4 MG tablet Take 1 tablet (4 mg total) by mouth every 8 (eight) hours as needed for nausea or vomiting. 10/29/18   Benay Pike, MD  predniSONE (DELTASONE) 2.5 MG tablet Take 2.5 mg by mouth daily. 12/27/17   [provider]  RABEprazole (ACIPHEX) 20 MG tablet TAKE 1 TABLET BY MOUTH EVERY  DAY Patient taking differently: Take 20 mg by mouth at bedtime.  09/21/17   Croitoru, Mihai, MD  sertraline (ZOLOFT) 50 MG tablet Take 50 mg by mouth every morning.  02/04/18   [provider]  vitamin C (ASCORBIC ACID) 500 MG tablet Take 500 mg by mouth daily.    [provider]  VITAMIN E PO Take 1,000 Units by mouth daily.     [provider]    Family History Family History  Problem Relation Age of Onset  . Heart disease Mother   . Breast cancer Maternal Aunt   . Cancer Brother        unaware  . Heart disease Brother     Social History Social History   Tobacco Use  . Smoking status: Former Smoker    Quit date: 05/11/1970    Years since quitting: 48.5  . Smokeless tobacco: Never Used  Substance Use Topics  . Alcohol use: No  . Drug use: No     Allergies   Arimidex [anastrozole], Fish allergy, Morphine and related, Shellfish allergy, and Vicodin [hydrocodone-acetaminophen]   Review of Systems Review of Systems  Gastrointestinal: Positive for abdominal pain, nausea and vomiting.  All other systems reviewed and are negative.    Physical Exam Updated Vital Signs BP (!) 156/67   Pulse 83   Temp 98.8 F (37.1 C) (Oral)   Resp (!) 99   Ht 5\' 5"  (1.651 m)   Wt 79.4 kg   SpO2 98%   BMI 29.12 kg/m   Physical Exam Vitals signs and nursing note reviewed.  Constitutional:      Appearance: She is well-developed.  HENT:     Head: Normocephalic and atraumatic.     Mouth/Throat:     Mouth: Mucous membranes are dry.     Pharynx: Oropharynx is clear.  Eyes:     Extraocular Movements: Extraocular movements intact.     Pupils: Pupils are equal, round, and reactive to light.  Cardiovascular:     Rate and Rhythm: Normal rate and regular rhythm.  Pulmonary:     Effort: Pulmonary effort is normal.     Breath sounds: Normal breath sounds.  Abdominal:     General: Abdomen is flat. Bowel sounds are normal.     Palpations: Abdomen is soft.      Tenderness: There is abdominal tenderness in the right upper quadrant and epigastric area.  Skin:    General: Skin is warm.     Capillary Refill: Capillary refill takes less than 2 seconds.  Neurological:     General: No focal deficit present.     Mental Status: She  is alert and oriented to person, place, and time.  Psychiatric:        Mood and Affect: Mood normal.        Behavior: Behavior normal.      ED Treatments / Results  Labs (all labs ordered are listed, but only abnormal results are displayed) Labs Reviewed  COMPREHENSIVE METABOLIC PANEL - Abnormal; Notable for the following components:      Result Value   Sodium 133 (*)    Chloride 93 (*)    Glucose, Bld 192 (*)    GFR calc non Af Amer 54 (*)    All other components within normal limits  CBC - Abnormal; Notable for the following components:   WBC 17.5 (*)    Platelets 422 (*)    All other components within normal limits  SARS CORONAVIRUS 2 (HOSPITAL ORDER, [place] [facility])  LIPASE, BLOOD  URINALYSIS, ROUTINE W REFLEX MICROSCOPIC    EKG EKG Interpretation  Date/Time:  Tuesday November 01 2018 20:10:46 EDT Ventricular Rate:  76 PR Interval:    QRS Duration: 91 QT Interval:  421 QTC Calculation: 474 R Axis:   -19 Text Interpretation:  Sinus rhythm Atrial premature complexes Borderline left axis deviation Borderline T wave abnormalities Since last tracing rate slower Confirmed by Isla Pence [phone]) on 11/01/2018 9:24:04 PM   Radiology US Abdomen Limited Ruq  Result Date: 11/01/2018 CLINICAL DATA:  Right upper quadrant pain. EXAM: ULTRASOUND ABDOMEN LIMITED RIGHT UPPER QUADRANT COMPARISON:  CT and right upper quadrant ultrasound 10/29/2018. Hepatic biliary scan 10/18/2018 FINDINGS: Gallbladder: Physiologically distended. Gallstones again seen. No gallbladder wall thickening or pericholecystic fluid. No sonographic Murphy sign noted by sonographer. Common bile duct: Diameter: 3 mm, normal.  Liver: No focal lesion identified. Within normal limits in parenchymal echogenicity. Portal vein is patent on color Doppler imaging with normal direction of blood flow towards the liver. IMPRESSION: Cholelithiasis without sonographic findings of acute cholecystitis. Electronically Signed   By: Keith Rake M.D.   On: 11/01/2018 20:55    Procedures Procedures (including critical care time)  Medications Ordered in ED Medications  ondansetron (ZOFRAN) injection 4 mg (4 mg Intravenous Given 11/01/18 2031)  sodium chloride 0.9 % bolus 500 mL (500 mLs Intravenous New Bag/Given (Non-Interop) 11/01/18 2029)  fentaNYL (SUBLIMAZE) injection 50 mcg (50 mcg Intravenous Given 11/01/18 2029)     Initial Impression / Assessment and Plan / ED Course  I have reviewed the triage vital signs and the nursing notes.  Pertinent labs & imaging results that were available during my care of the patient were reviewed by me and considered in my medical decision making (see chart for details).  Pt's GB US again shows cholelithiasis, but no cholecystitis.  She is unable to tolerate po fluids.    She is on Eliquis for afib.  Pt d/w Dr. Josephine Cables ([facility]) for admission.  Final Clinical Impressions(s) / ED Diagnoses   Final diagnoses:  RUQ pain  Intractable nausea and vomiting  Calculus of gallbladder without cholecystitis without obstruction    ED Discharge Orders    None       Isla Pence, MD 11/01/18 2126

## 2018-11-01 NOTE — ED Triage Notes (Signed)
Patient c/o mid abdominal pain that radiates into the id back. Patient has a history of gallstones. Patient was seen on 10/29/18 and vomited x 1 today since being seen on 10/29/18.

## 2018-11-02 ENCOUNTER — Telehealth: Payer: Self-pay | Admitting: Cardiology

## 2018-11-02 ENCOUNTER — Other Ambulatory Visit: Payer: Medicare HMO | Admitting: Internal Medicine

## 2018-11-02 ENCOUNTER — Encounter (HOSPITAL_COMMUNITY): Payer: Self-pay

## 2018-11-02 DIAGNOSIS — G8929 Other chronic pain: Secondary | ICD-10-CM

## 2018-11-02 DIAGNOSIS — I482 Chronic atrial fibrillation, unspecified: Secondary | ICD-10-CM

## 2018-11-02 DIAGNOSIS — M549 Dorsalgia, unspecified: Secondary | ICD-10-CM

## 2018-11-02 DIAGNOSIS — K810 Acute cholecystitis: Secondary | ICD-10-CM | POA: Diagnosis present

## 2018-11-02 DIAGNOSIS — E785 Hyperlipidemia, unspecified: Secondary | ICD-10-CM

## 2018-11-02 DIAGNOSIS — I10 Essential (primary) hypertension: Secondary | ICD-10-CM

## 2018-11-02 DIAGNOSIS — Z853 Personal history of malignant neoplasm of breast: Secondary | ICD-10-CM

## 2018-11-02 DIAGNOSIS — K8000 Calculus of gallbladder with acute cholecystitis without obstruction: Secondary | ICD-10-CM

## 2018-11-02 DIAGNOSIS — E860 Dehydration: Secondary | ICD-10-CM

## 2018-11-02 DIAGNOSIS — R112 Nausea with vomiting, unspecified: Secondary | ICD-10-CM

## 2018-11-02 DIAGNOSIS — M545 Low back pain: Secondary | ICD-10-CM

## 2018-11-02 DIAGNOSIS — Z515 Encounter for palliative care: Secondary | ICD-10-CM

## 2018-11-02 DIAGNOSIS — R109 Unspecified abdominal pain: Secondary | ICD-10-CM

## 2018-11-02 LAB — COMPREHENSIVE METABOLIC PANEL
ALT: 24 U/L (ref 0–44)
AST: 18 U/L (ref 15–41)
Albumin: 3.5 g/dL (ref 3.5–5.0)
Alkaline Phosphatase: 53 U/L (ref 38–126)
Anion gap: 12 (ref 5–15)
BUN: 25 mg/dL — ABNORMAL HIGH (ref 8–23)
CO2: 27 mmol/L (ref 22–32)
Calcium: 8.7 mg/dL — ABNORMAL LOW (ref 8.9–10.3)
Chloride: 96 mmol/L — ABNORMAL LOW (ref 98–111)
Creatinine, Ser: 0.82 mg/dL (ref 0.44–1.00)
GFR calc Af Amer: >60 mL/min (ref >60)
GFR calc non Af Amer: >60 mL/min (ref >60)
Glucose, Bld: 161 mg/dL — ABNORMAL HIGH (ref 70–99)
Potassium: 3.2 mmol/L — ABNORMAL LOW (ref 3.5–5.1)
Sodium: 135 mmol/L (ref 135–145)
Total Bilirubin: 0.8 mg/dL (ref 0.3–1.2)
Total Protein: 6.8 g/dL (ref 6.5–8.1)

## 2018-11-02 LAB — BASIC METABOLIC PANEL
Anion gap: 12 (ref 5–15)
BUN: 17 mg/dL (ref 8–23)
CO2: 23 mmol/L (ref 22–32)
Calcium: 8.5 mg/dL — ABNORMAL LOW (ref 8.9–10.3)
Chloride: 97 mmol/L — ABNORMAL LOW (ref 98–111)
Creatinine, Ser: 0.72 mg/dL (ref 0.44–1.00)
GFR calc Af Amer: >60 mL/min (ref >60)
GFR calc non Af Amer: >60 mL/min (ref >60)
Glucose, Bld: 174 mg/dL — ABNORMAL HIGH (ref 70–99)
Potassium: 3.7 mmol/L (ref 3.5–5.1)
Sodium: 132 mmol/L — ABNORMAL LOW (ref 135–145)

## 2018-11-02 LAB — URINALYSIS, ROUTINE W REFLEX MICROSCOPIC
Bilirubin Urine: NEGATIVE
Glucose, UA: NEGATIVE mg/dL
Hgb urine dipstick: NEGATIVE
Ketones, ur: NEGATIVE mg/dL
Nitrite: NEGATIVE
Protein, ur: NEGATIVE mg/dL
Specific Gravity, Urine: 1.016 (ref 1.005–1.030)
WBC, UA: >50 WBC/hpf — ABNORMAL HIGH (ref 0–5)
pH: 6 (ref 5.0–8.0)

## 2018-11-02 LAB — MAGNESIUM: Magnesium: 2.2 mg/dL (ref 1.7–2.4)

## 2018-11-02 MED ORDER — ONDANSETRON HCL 4 MG/2ML IJ SOLN
4.0000 mg | Freq: Four times a day (QID) | INTRAMUSCULAR | Status: DC | PRN
  Administered 2018-11-02 – 2018-11-05 (×3): 4 mg via INTRAVENOUS
  Filled 2018-11-02 (×3): qty 2

## 2018-11-02 MED ORDER — SODIUM CHLORIDE 0.9 % IV SOLN
INTRAVENOUS | Status: DC
  Administered 2018-11-02 – 2018-11-09 (×13): via INTRAVENOUS

## 2018-11-02 MED ORDER — GABAPENTIN 100 MG PO CAPS
100.0000 mg | ORAL_CAPSULE | Freq: Every day | ORAL | Status: DC
  Administered 2018-11-02 – 2018-11-09 (×8): 100 mg via ORAL
  Filled 2018-11-02 (×8): qty 1

## 2018-11-02 MED ORDER — SERTRALINE HCL 50 MG PO TABS
50.0000 mg | ORAL_TABLET | Freq: Every morning | ORAL | Status: DC
  Administered 2018-11-04 – 2018-11-10 (×7): 50 mg via ORAL
  Filled 2018-11-02 (×7): qty 1

## 2018-11-02 MED ORDER — PROMETHAZINE HCL 25 MG/ML IJ SOLN
12.5000 mg | Freq: Once | INTRAMUSCULAR | Status: AC
  Administered 2018-11-02: 12.5 mg via INTRAVENOUS
  Filled 2018-11-02: qty 1

## 2018-11-02 MED ORDER — SODIUM CHLORIDE 0.9 % IV SOLN
Freq: Once | INTRAVENOUS | Status: AC
  Administered 2018-11-02: 05:00:00 via INTRAVENOUS

## 2018-11-02 MED ORDER — HEPARIN SODIUM (PORCINE) 5000 UNIT/ML IJ SOLN: 5000.0000 [IU] | Freq: Three times a day (TID) | INTRAMUSCULAR | Status: DC

## 2018-11-02 MED ORDER — MORPHINE SULFATE (PF) 2 MG/ML IV SOLN
2.0000 mg | Freq: Once | INTRAVENOUS | Status: AC
  Administered 2018-11-02: 2 mg via INTRAVENOUS
  Filled 2018-11-02: qty 1

## 2018-11-02 MED ORDER — DILTIAZEM HCL ER COATED BEADS 180 MG PO CP24
180.0000 mg | ORAL_CAPSULE | Freq: Every day | ORAL | Status: DC
  Administered 2018-11-02 – 2018-11-07 (×6): 180 mg via ORAL
  Filled 2018-11-02 (×6): qty 1

## 2018-11-02 MED ORDER — POTASSIUM CHLORIDE 10 MEQ/100ML IV SOLN
10.0000 meq | INTRAVENOUS | Status: AC
  Administered 2018-11-02 (×4): 10 meq via INTRAVENOUS
  Filled 2018-11-02 (×4): qty 100

## 2018-11-02 MED ORDER — APIXABAN 5 MG PO TABS
5.0000 mg | ORAL_TABLET | Freq: Two times a day (BID) | ORAL | Status: DC
  Administered 2018-11-02 – 2018-11-03 (×3): 5 mg via ORAL
  Filled 2018-11-02 (×4): qty 1

## 2018-11-02 MED ORDER — ATORVASTATIN CALCIUM 20 MG PO TABS
20.0000 mg | ORAL_TABLET | Freq: Every day | ORAL | Status: DC
  Administered 2018-11-03 – 2018-11-10 (×8): 20 mg via ORAL
  Filled 2018-11-02 (×9): qty 1

## 2018-11-02 MED ORDER — DILTIAZEM HCL 100 MG IV SOLR
5.0000 mg/h | INTRAVENOUS | Status: DC
  Administered 2018-11-02: 5 mg/h via INTRAVENOUS
  Filled 2018-11-02 (×3): qty 100

## 2018-11-02 MED ORDER — HYDROCODONE-ACETAMINOPHEN 5-325 MG PO TABS
1.0000 | ORAL_TABLET | Freq: Four times a day (QID) | ORAL | Status: DC | PRN
  Administered 2018-11-02 – 2018-11-09 (×5): 1 via ORAL
  Filled 2018-11-02 (×5): qty 1

## 2018-11-02 MED ORDER — PREDNISONE 2.5 MG PO TABS
1.2500 mg | ORAL_TABLET | Freq: Every day | ORAL | Status: DC
  Administered 2018-11-02: 1.25 mg via ORAL
  Filled 2018-11-02 (×3): qty 0.5

## 2018-11-02 MED ORDER — PANTOPRAZOLE SODIUM 40 MG IV SOLR
40.0000 mg | INTRAVENOUS | Status: DC
  Administered 2018-11-02 – 2018-11-05 (×4): 40 mg via INTRAVENOUS
  Filled 2018-11-02 (×4): qty 40

## 2018-11-02 NOTE — ED Notes (Signed)
ED TO INPATIENT HANDOFF REPORT  Name/Age/Gender [name] 85 y.o. female  Code Status    Code Status Orders  (From admission, onward)         Start     Ordered   11/01/18 2207  Full code  Continuous     11/01/18 2207        Code Status History    Date Active Date Inactive Code Status Order ID Comments User Context   10/18/2018 0346 10/19/2018 1624 Full Code [id number]  Rise Patience, MD Inpatient   09/12/2016 1713 09/13/2016 1745 Full Code [id number]  [facility] ED   06/11/2011 1518 06/12/2011 1056 Full Code [id number]  Maness, Tenna Child, RN Inpatient   Advance Care Planning Activity    Advance Directive Documentation     Most Recent Value  Type of Advance Directive  Healthcare Power of Attorney, Living will  Pre-existing out of facility DNR order (yellow form or pink MOST form)  -  "MOST" Form in Place?  -      Home/SNF/Other Nursing Home  Chief Complaint emesis  Level of Care/Admitting Diagnosis ED Disposition    ED Disposition Condition [place]: [place] [[id number]]  Level of Care: Med-Surg [16]  Covid Evaluation: Confirmed COVID Negative  Diagnosis: Cholelithiasis [[id number]]  Admitting Physician: Bernadette Hoit [[id number]]  Attending Physician: Bernadette Hoit [[id number]]  Estimated length of stay: past midnight tomorrow  Certification:: I certify this patient will need inpatient services for at least 2 midnights  PT Class (Do Not Modify): Inpatient [101]  PT Acc Code (Do Not Modify): Private [1]       Medical History Past Medical History:  Diagnosis Date  . Arthritis    low spine  . Breast cancer [facility]) 2013   right breast  . Breast cancer, right breast, recurrent 06/03/2011  . Cancer [facility]) 1989   breast right  . Chronic back pain   . Hypertension     Allergies Allergies  Allergen Reactions  . Arimidex [Anastrozole] Other (See Comments)    Neck cramping  . Fish Allergy Other (See Comments)    Patient prefers to NOT eat this  . Morphine And Related Nausea And Vomiting  . Shellfish Allergy Other (See Comments)    Patient prefers to NOT eat this  . Vicodin [Hydrocodone-Acetaminophen] Nausea Only    DOES tolerate Vicodin/Norco, however    IV Location/Drains/Wounds Patient Lines/Drains/Airways Status   Active Line/Drains/Airways    Name:   Placement date:   Placement time:   Site:   Days:   Peripheral IV 11/01/18 Left Forearm   11/01/18    2028    Forearm   1          Labs/Imaging Results for orders placed or performed during the hospital encounter of 11/01/18 (from the past 48 hour(s))  Lipase, blood     Status: None   Collection Time: 11/01/18  6:20 PM  Result Value Ref Range   Lipase 40 11 - 51 U/L    Comment: Performed at [facility], [place] [address] [place], [place] [address]  Comprehensive metabolic panel     Status: Abnormal   Collection Time: 11/01/18  6:20 PM  Result Value Ref Range   Sodium 133 (L) 135 - 145 mmol/L   Potassium 3.6 3.5 - 5.1 mmol/L   Chloride 93 (L) 98 - 111 mmol/L   CO2 27 22 - 32 mmol/L   Glucose, Bld  192 (H) 70 - 99 mg/dL   BUN 20 8 - 23 mg/dL   Creatinine, Ser 0.93 0.44 - 1.00 mg/dL   Calcium 9.2 8.9 - 10.3 mg/dL   Total Protein 7.3 6.5 - 8.1 g/dL   Albumin 3.8 3.5 - 5.0 g/dL   AST 21 15 - 41 U/L   ALT 24 0 - 44 U/L   Alkaline Phosphatase 59 38 - 126 U/L   Total Bilirubin 0.8 0.3 - 1.2 mg/dL   GFR calc non Af Amer 54 (L) >60 mL/min   GFR calc Af Amer >60 >60 mL/min   Anion gap 13 5 - 15    Comment: Performed at [facility], [place] [address] [place], [place] [address]  CBC     Status: Abnormal   Collection Time: 11/01/18  6:20 PM  Result Value Ref Range   WBC 17.5 (H) 4.0 - 10.5 K/uL   RBC 4.78 3.87 - 5.11 MIL/uL   Hemoglobin 15.0 12.0 - 15.0 g/dL   HCT 45.1 36.0 - 46.0 %   MCV 94.4 80.0 - 100.0 fL   MCH 31.4 26.0 - 34.0 pg   MCHC 33.3 30.0 - 36.0 g/dL   RDW 12.4 11.5 - 15.5 %    Platelets 422 (H) 150 - 400 K/uL   nRBC 0.0 0.0 - 0.2 %    Comment: Performed at [facility], [place] [address], [place], [place] [address]  SARS Coronavirus 2 (CEPHEID - Performed in [place] hospital lab), Hosp Order     Status: None   Collection Time: 11/01/18  8:01 PM   Specimen: Nasopharyngeal Swab  Result Value Ref Range   SARS Coronavirus 2 NEGATIVE NEGATIVE    Comment: (NOTE) If result is NEGATIVE SARS-CoV-2 target nucleic acids are NOT DETECTED. The SARS-CoV-2 RNA is generally detectable in upper and lower  respiratory specimens during the acute phase of infection. The lowest  concentration of SARS-CoV-2 viral copies this assay can detect is 250  copies / mL. A negative result does not preclude SARS-CoV-2 infection  and should not be used as the sole basis for treatment or other  patient management decisions.  A negative result may occur with  improper specimen collection / handling, submission of specimen other  than nasopharyngeal swab, presence of viral mutation(s) within the  areas targeted by this assay, and inadequate number of viral copies  (<250 copies / mL). A negative result must be combined with clinical  observations, patient history, and epidemiological information. If result is POSITIVE SARS-CoV-2 target nucleic acids are DETECTED. The SARS-CoV-2 RNA is generally detectable in upper and lower  respiratory specimens dur ing the acute phase of infection.  Positive  results are indicative of active infection with SARS-CoV-2.  Clinical  correlation with patient history and other diagnostic information is  necessary to determine patient infection status.  Positive results do  not rule out bacterial infection or co-infection with other viruses. If result is PRESUMPTIVE POSTIVE SARS-CoV-2 nucleic acids MAY BE PRESENT.   A presumptive positive result was obtained on the submitted specimen  and confirmed on repeat testing.  While 2019 novel  coronavirus  (SARS-CoV-2) nucleic acids may be present in the submitted sample  additional confirmatory testing may be necessary for epidemiological  and / or clinical management purposes  to differentiate between  SARS-CoV-2 and other Sarbecovirus currently known to infect humans.  If clinically indicated additional testing with an alternate test  methodology [phone]) is advised. The SARS-CoV-2 RNA  is generally  detectable in upper and lower respiratory sp ecimens during the acute  phase of infection. The expected result is Negative. Fact Sheet for Patients:  StrictlyIdeas.no Fact Sheet for Healthcare Providers: BankingDealers.co.za This test is not yet approved or cleared by the [place] FDA and has been authorized for detection and/or diagnosis of SARS-CoV-2 by FDA under an Emergency Use Authorization (EUA).  This EUA will remain in effect (meaning this test can be used) for the duration of the COVID-19 declaration under Section 564(b)(1) of the Act, 21 U.S.C. section 360bbb-3(b)(1), unless the authorization is terminated or revoked sooner. Performed at [facility], [place] [address], [place], [place] [address]   Comprehensive metabolic panel     Status: Abnormal   Collection Time: 11/02/18  4:53 AM  Result Value Ref Range   Sodium 135 135 - 145 mmol/L   Potassium 3.2 (L) 3.5 - 5.1 mmol/L   Chloride 96 (L) 98 - 111 mmol/L   CO2 27 22 - 32 mmol/L   Glucose, Bld 161 (H) 70 - 99 mg/dL   BUN 25 (H) 8 - 23 mg/dL   Creatinine, Ser 0.82 0.44 - 1.00 mg/dL   Calcium 8.7 (L) 8.9 - 10.3 mg/dL   Total Protein 6.8 6.5 - 8.1 g/dL   Albumin 3.5 3.5 - 5.0 g/dL   AST 18 15 - 41 U/L   ALT 24 0 - 44 U/L   Alkaline Phosphatase 53 38 - 126 U/L   Total Bilirubin 0.8 0.3 - 1.2 mg/dL   GFR calc non Af Amer >60 >60 mL/min   GFR calc Af Amer >60 >60 mL/min   Anion gap 12 5 - 15    Comment: Performed at [facility], [place] [address], [place], [place] [address]  Magnesium     Status: None   Collection Time: 11/02/18  4:53 AM  Result Value Ref Range   Magnesium 2.2 1.7 - 2.4 mg/dL    Comment: Performed at [facility], [place] [address], [place],  [address]   US Abdomen Limited Ruq  Result Date: 11/01/2018 CLINICAL DATA:  Right upper quadrant pain. EXAM: ULTRASOUND ABDOMEN LIMITED RIGHT UPPER QUADRANT COMPARISON:  CT and right upper quadrant ultrasound 10/29/2018. Hepatic biliary scan 10/18/2018 FINDINGS: Gallbladder: Physiologically distended. Gallstones again seen. No gallbladder wall thickening or pericholecystic fluid. No sonographic Murphy sign noted by sonographer. Common bile duct: Diameter: 3 mm, normal. Liver: No focal lesion identified. Within normal limits in parenchymal echogenicity. Portal vein is patent on color Doppler imaging with normal direction of blood flow towards the liver. IMPRESSION: Cholelithiasis without sonographic findings of acute cholecystitis. Electronically Signed   By: Keith Rake M.D.   On: 11/01/2018 20:55    Pending Labs Unresulted Labs (From admission, onward)    Start     Ordered   11/02/18 5188  Basic metabolic panel  Once-Timed,   STAT     11/02/18 0938   11/01/18 1736  Urinalysis, Routine w reflex microscopic  ONCE - STAT,   STAT     11/01/18 1735          Vitals/Pain Today's Vitals   11/02/18 1200 11/02/18 1230 11/02/18 1330 11/02/18 1430  BP: 140/80 101/89 101/89 (!) 132/92  Pulse: (!) 123 (!) 115 78 (!) 40  Resp: (!) 21 18 (!) 21 19  Temp:      TempSrc:      SpO2: 91% 91% 95% 90%  Weight:  Height:      PainSc:        Isolation Precautions No active isolations  Medications Medications  ondansetron (ZOFRAN) injection 4 mg (has no administration in time range)  pantoprazole (PROTONIX) injection 40 mg (40 mg Intravenous Given 11/02/18 0827)  gabapentin (NEURONTIN) capsule 100 mg (has no administration in  time range)  atorvastatin (LIPITOR) tablet 20 mg (20 mg Oral Not Given 11/02/18 1130)  HYDROcodone-acetaminophen (NORCO/VICODIN) 5-325 MG per tablet 1 tablet (has no administration in time range)  sertraline (ZOLOFT) tablet 50 mg (50 mg Oral Not Given 11/02/18 1130)  predniSONE (DELTASONE) tablet 1.25 mg (1.25 mg Oral Given 11/02/18 0829)  apixaban (ELIQUIS) tablet 5 mg (5 mg Oral Given 11/02/18 0829)  diltiazem (CARDIZEM) 100 mg in dextrose 5 % 100 mL (1 mg/mL) infusion (10 mg/hr Intravenous Rate/Dose Change 11/02/18 1218)  ondansetron (ZOFRAN) injection 4 mg (4 mg Intravenous Given 11/01/18 2031)  sodium chloride 0.9 % bolus 500 mL (0 mLs Intravenous Stopped 11/01/18 2313)  fentaNYL (SUBLIMAZE) injection 50 mcg (50 mcg Intravenous Given 11/01/18 2029)  0.9 %  sodium chloride infusion ( Intravenous New Bag/Given 11/02/18 0449)  potassium chloride 10 mEq in 100 mL IVPB (0 mEq Intravenous Stopped 11/02/18 1319)    Mobility non-ambulatory

## 2018-11-02 NOTE — Progress Notes (Signed)
PROGRESS NOTE  [name] IWO:[id number] DOB: September 29, 1925 DOA: 11/01/2018 PCP: Mayra Neer, MD  Brief History   The patient is a 85 yr old woman who presented to the ED on 10/30/2018 with a complaint of mild diffuse abdominal pain. CT abdomen and [place] were negative for cholecystitis. Pt and her daughter preferred to be discharged to home on that occasion, but once they got home, the patient was unable to keep PO down. She returned to the ED on 11/01/2018 with complaints of abdominal pain and nausea and vomiting since 10/29/2018.  The patient has a past medical history significant for dementia, hypertension, atrial fibrillation, breast cancer in remission, chronic back pain, and arthritis. She has recently had an admission for encephalopathy and rhabdomyolysis.   Consultants  . None  Procedures  . None  Antibiotics   Anti-infectives (From admission, onward)   None    .  [place]   Subjective  The patient is resting quietly. She has no new complaints. She continues to complain of upper abdomen/chest pain.   Objective   Vitals:  Vitals:   11/02/18 1730 11/02/18 1822  BP: 111/73 (!) 158/75  Pulse: 86 84  Resp: 19 18  Temp:  98.3 F (36.8 C)  SpO2: 95% 93%    Exam:  Constitutional:  . The patient is awake and alert. She is oriented only to self. She is in mild distress from abdominal/chest pain. Respiratory:  . No increased work of breathing. . No wheezes, rales, or rhonchi. . No tactile fremitus. Cardiovascular:  . Rapid rate and irregular rhythm. . No murmurs, ectopy,or gallups. . No lateral PMI. No thrills. Abdomen:  . Abdomen is tender in the right upper quadrant and the epigastrum. It is mildly distended.  . No hernias, masses, or organomegaly. . Hypoactive bowel sound.s Musculoskeletal:  . No cyanosis, clubbing, or edema Skin:  . No rashes, lesions, ulcers . palpation of skin: no induration or nodules Neurologic:  . CN 2-12 intact . Sensation all 4  extremities intact Psychiatric:  . Mental status o Mood, affect appropriate . judgment and insight are not intact.   I have personally reviewed the following:   Today's Data  . CBC, CMP, Vitals  Cardiology Data  . EKG - atrial fibrillation with RVR.  Scheduled Meds: . apixaban  5 mg Oral BID  . atorvastatin  20 mg Oral Daily  . diltiazem  180 mg Oral Daily  . gabapentin  100 mg Oral QHS  . pantoprazole (PROTONIX) IV  40 mg Intravenous Q24H  . predniSONE  1.25 mg Oral Q breakfast  . sertraline  50 mg Oral q morning - 10a   Continuous Infusions: . sodium chloride    . diltiazem (CARDIZEM) infusion 10 mg/hr (11/02/18 1218)    Principal Problem:   Cholelithiasis Active Problems:   Personal history of malignant neoplasm of breast   Essential hypertension   Atrial fibrillation, chronic   Abdominal pain   Nausea and vomiting   Dehydration   Dyslipidemia   Chronic back pain   Acute cholecystitis   LOS: 1 day    A & P   Atrial Fibrillation with RVR: Patient was started on IV diltiazem as she was unable to keep her PO medications down. Her rate was brought under control, and her drip was discontinued. She is now on the floor and her oral diltiazem has been restarted. If she is unable to keep it down, she may need to be returned to the drip.  Cholecystitis:  Possibly due to biliary dyskinesia. HIDA scan is ordered and will be performed tomorrow. Right upper quadrant ultrasound was unremarkable except for the presence of stones. No CBD dilatation.   Abdominal pain: Continue hydrocodone for pain control.  Nausea and vomiting: Antiemetics and IV fluids ordered. The patient is now requesting PO. Will try her on clears tonight. She will be NPO after midnight for her HIDA scan.  Dehydration: Continue IV fluids. Monitor volume status.  Dyslipidemia: Statin is on hold.  Chronic back pain: Continue home pain meds as possible.  I have seen and examined this patient myself. I  have spent 48 minutes in her evaluation and care,.  DVT prophylaxis: Eliquis Code Status: Full Code Family Communication: I spoke with both of the patient's daughters today, [name] (POA), and [name] Disposition Plan: tbd   Karie Kirks, DO [facility] Direct contact: see [email or web address]  7PM-7AM contact night coverage as above 11/02/2018, 6:45 PM  LOS: 1 day

## 2018-11-02 NOTE — Progress Notes (Signed)
2:20pm Patient had been scheduled for an initial home based Palliative Care consult today.  I will reschedule after she is discharged form the hospital, if she returns to her home.  Violeta Gelinas NP-C [phone]

## 2018-11-02 NOTE — Telephone Encounter (Signed)
LVM, reminding pt of his appt on 11-03-18. I left message for pt to call back and be pre-screened for COVID-19.

## 2018-11-02 NOTE — ED Notes (Signed)
Message sent to [facility], DO regarding pt monitor reading A.Fib, with HR of 138.

## 2018-11-02 NOTE — ED Notes (Signed)
purewick placed on patient.  

## 2018-11-02 NOTE — Progress Notes (Signed)
Pt admitted from ED to room 1423. Cardizem gtt stopped in ED prior to transfer. Upon arrival to unit, pt c/o "acid reflux" and is holding her chest/epigastric area while grimacing. EKG done which shows NSR with PACs. P waves present. VSS. MD Swayze contacted regarding possible chest pain and conversion to NSR. Per MD, this radiating epigastric pain is same pain since admission. Pt oriented to self only, unable to provide many answers. Will give PO Cardizem and Percocet per MD. Will continue to monitor closely.

## 2018-11-02 NOTE — Progress Notes (Signed)
Chart reviewed. Unit [facility] CM/CSW will follow for possible placement. Pt is from [place] and has [facility] for HHPT. Jonnie Finner RN CCM Case Mgmt phone [phone]

## 2018-11-02 NOTE — ED Notes (Signed)
Pt transported to Nuc Med. 

## 2018-11-02 NOTE — H&P (Signed)
History and Physical  [name] BTD:[id number] DOB: 23-Sep-1925 DOA: 11/01/2018  Referring physician: Isla Pence PCP: Mayra Neer, MD  Patient coming from: Home  Chief Complaint: Abdominal pain   HPI: [name] is a 85 y.o. female with medical history significant for hypertension, atrial fibrillation, breast cancer in remission, chronic back pain, arthritis who presents to the emergency department.  Patient complained of abdominal pain but was unable to provide details regarding her condition, history was obtained from ED physician and ED chart.  Per report, patient was complaining of abdominal pain, so she was brought to the ED by the daughter.  She was recently discharged due to acute metabolic encephalopathy and rhabdomyolysis.  Apparently patient presented to the ED on 7/19 due to mild diffuse abdominal pain, a repeat ultrasound and CT was done at that time that showed no cholecystitis.  Patient was to be admitted, but patient wants to go home, so daughter agreed to see surgery as an outpatient.  Apparently patient has not been able to keep anything down since 7/18.  No report of fever/chills.  (ED Course:  In the emergency room, Patient was mildly tachypneic, but other vital signs were normal.  Work-up in the ED showed leukocytosis and mild hyponatremia as well as hyperglycemia. SARS Coronavirus was negative. RUQ ultrasound showed cholelithiasis without sonographic findings of acute cholecystitis.  IV hydration 500 mL of NS and IV Zofran given.  Pain medication was given.  Hospitalist was asked to admit for further evaluation and management.  Review of Systems: Constitutional: Negative for chills and fever.  HENT: Negative for ear pain and sore throat.   Eyes: Negative for pain and visual disturbance.  Respiratory: Negative for cough, chest tightness and shortness of breath.   Cardiovascular: Negative for chest pain and palpitations.  Gastrointestinal: Abdominal pain,  nausea and vomiting.    Endocrine: Negative for polyphagia and polyuria.  Genitourinary: Negative for decreased urine volume. Musculoskeletal: Negative for arthralgias and back pain.  Skin: Negative for color change and rash.  Allergic/Immunologic: Negative for immunocompromised state.  Neurological: Negative for tremors, syncope, speech difficulty, weakness, light-headedness and headaches.  Hematological: Does not bruise/bleed easily.     Past Medical History:  Diagnosis Date  . Arthritis    low spine  . Breast cancer [facility]) 2013   right breast  . Breast cancer, right breast, recurrent 06/03/2011  . Cancer [facility]) 1989   breast right  . Chronic back pain   . Hypertension    Past Surgical History:  Procedure Laterality Date  . ABDOMINAL HYSTERECTOMY  1974  . BREAST SURGERY  1989   rt lumpectomy-axillary dissection  . COLONOSCOPY    . EYE SURGERY     both cataracts  . JOINT REPLACEMENT  2004, 2005   both knees   . MASTECTOMY Right 06/11/11   right breast  . SHOULDER SURGERY     patient does not remember date of procedure    Social History:  reports that she quit smoking about 48 years ago. She has never used smokeless tobacco. She reports that she does not drink alcohol or use drugs.   Allergies  Allergen Reactions  . Arimidex [Anastrozole] Other (See Comments)    Neck cramping  . Fish Allergy Other (See Comments)    Patient prefers to NOT eat this  . Morphine And Related Nausea And Vomiting  . Shellfish Allergy Other (See Comments)    Patient prefers to NOT eat this  . Vicodin [Hydrocodone-Acetaminophen] Nausea Only  DOES tolerate Vicodin/Norco, however    Family History  Problem Relation Age of Onset  . Heart disease Mother   . Breast cancer Maternal Aunt   . Cancer Brother        unaware  . Heart disease Brother       Prior to Admission medications   Medication Sig Start Date End Date Taking? Authorizing Provider  acetaminophen (TYLENOL) 500 MG tablet  Take 500-1,000 mg by mouth every 8 (eight) hours as needed (for arthritis pain).   Yes [provider]  diltiazem (CARDIZEM CD) 180 MG 24 hr capsule Take 1 capsule (180 mg total) by mouth daily. Patient taking differently: Take 180 mg by mouth every evening.  10/19/18 12/18/18 Yes Amin, Ankit Chirag, MD  ELIQUIS 5 MG TABS tablet TAKE 1 TABLET BY MOUTH TWICE A DAY Patient taking differently: Take 5 mg by mouth 2 (two) times daily.  07/14/18  Yes Croitoru, Mihai, MD  HYDROcodone-acetaminophen (NORCO/VICODIN) 5-325 MG tablet Take 1 tablet by mouth every 8 (eight) hours as needed for pain. 01/25/18  Yes [provider]  losartan (COZAAR) 25 MG tablet TAKE 2 TABLETS BY MOUTH (50MG  TOTAL) EVERY DAY PT NEEDS OFFICE VISIT Patient taking differently: Take 25 mg by mouth 2 (two) times a day.  10/28/18  Yes Kilroy, Doreene Burke, PA-C  Multiple Vitamins-Minerals (CENTRUM SILVER 50+WOMEN PO) Take 1 tablet by mouth daily with breakfast.   Yes [provider]  nitroGLYCERIN (NITROSTAT) 0.4 MG SL tablet Place 1 tablet (0.4 mg total) under the tongue every 5 (five) minutes x 3 doses as needed for chest pain. 09/13/16  Yes Kilroy, Luke K, PA-C  ondansetron (ZOFRAN) 4 MG tablet Take 1 tablet (4 mg total) by mouth every 8 (eight) hours as needed for nausea or vomiting. 10/29/18  Yes Benay Pike, MD  predniSONE (DELTASONE) 2.5 MG tablet Take 1.25 mg by mouth daily.  12/27/17  Yes [provider]  sertraline (ZOLOFT) 50 MG tablet Take 50 mg by mouth every morning.  02/04/18  Yes [provider]  vitamin C (ASCORBIC ACID) 500 MG tablet Take 500 mg by mouth daily.   Yes [provider]  VITAMIN E PO Take 1,000 Units by mouth daily.    Yes [provider]  atorvastatin (LIPITOR) 20 MG tablet TAKE 1 TABLET BY MOUTH EVERY DAY Patient not taking: No sig reported 09/21/17   Croitoru, Mihai, MD  gabapentin (NEURONTIN) 100 MG capsule Take 100 mg by mouth at bedtime.  04/03/11    [provider]  RABEprazole (ACIPHEX) 20 MG tablet TAKE 1 TABLET BY MOUTH EVERY DAY Patient not taking: No sig reported 09/21/17   Croitoru, Dani Gobble, MD    Physical Exam: BP (!) 105/45 (BP Location: Left Arm)   Pulse 92   Temp 98.8 F (37.1 C) (Oral)   Resp (!) 23   Ht 5\' 5"  (1.651 m)   Wt 79.4 kg   SpO2 93%   BMI 29.12 kg/m   . General: 85 y.o. year-old female well developed well nourished in no acute distress.  Alert and oriented x3. Marland Kitchen HEENT: Normocephalic, atraumatic, dry mucous membrane . Cardiovascular: Regular rate and rhythm with no rubs or gallops.  No thyromegaly or JVD noted.  No lower extremity edema. 2/4 pulses in all 4 extremities. Marland Kitchen Respiratory: Clear to auscultation with no wheezes or rales. Good inspiratory effort. . Abdomen: Tender to palpation of RUQ and epigastric area.  Normal bowel sounds x4 quadrants. . Muskuloskeletal: No cyanosis,  clubbing or edema noted bilaterally . Neuro: CN II-XII intact, strength, sensation, reflexes . Skin: No ulcerative lesions noted or rashes . Psychiatry: Judgement and insight appear normal. Mood is appropriate for condition and setting          Labs on Admission:  Basic Metabolic Panel: Recent Labs  Lab 10/29/18 1548 11/01/18 1820  NA 135 133*  K 4.2 3.6  CL 98 93*  CO2 24 27  GLUCOSE 178* 192*  BUN 8 20  CREATININE 0.71 0.93  CALCIUM 9.6 9.2   Liver Function Tests: Recent Labs  Lab 10/29/18 1548 11/01/18 1820  AST 23 21  ALT 26 24  ALKPHOS 69 59  BILITOT 0.7 0.8  PROT 7.4 7.3  ALBUMIN 3.8 3.8   Recent Labs  Lab 10/29/18 1548 11/01/18 1820  LIPASE 37 40   No results for input(s): AMMONIA in the last 168 hours. CBC: Recent Labs  Lab 10/29/18 1548 11/01/18 1820  WBC 16.5* 17.5*  HGB 15.3* 15.0  HCT 46.4* 45.1  MCV 95.9 94.4  PLT 353 422*   Cardiac Enzymes: No results for input(s): CKTOTAL, CKMB, CKMBINDEX, TROPONINI in the last 168 hours.  BNP (last 3 results) No results for input(s):  BNP in the last 8760 hours.  ProBNP (last 3 results) No results for input(s): PROBNP in the last 8760 hours.  CBG: No results for input(s): GLUCAP in the last 168 hours.  Radiological Exams on Admission: US Abdomen Limited Ruq  Result Date: 11/01/2018 CLINICAL DATA:  Right upper quadrant pain. EXAM: ULTRASOUND ABDOMEN LIMITED RIGHT UPPER QUADRANT COMPARISON:  CT and right upper quadrant ultrasound 10/29/2018. Hepatic biliary scan 10/18/2018 FINDINGS: Gallbladder: Physiologically distended. Gallstones again seen. No gallbladder wall thickening or pericholecystic fluid. No sonographic Murphy sign noted by sonographer. Common bile duct: Diameter: 3 mm, normal. Liver: No focal lesion identified. Within normal limits in parenchymal echogenicity. Portal vein is patent on color Doppler imaging with normal direction of blood flow towards the liver. IMPRESSION: Cholelithiasis without sonographic findings of acute cholecystitis. Electronically Signed   By: Keith Rake M.D.   On: 11/01/2018 20:55    EKG: I independently viewed the EKG done and my findings are as followed: Sinus rhythm at a rate of 76 bpm with atrial premature complexes  Assessment/Plan Present on Admission: . Cholelithiasis . Essential hypertension . Atrial fibrillation, chronic  Principal Problem:   Cholelithiasis Active Problems:   Personal history of malignant neoplasm of breast   Essential hypertension   Atrial fibrillation, chronic   Abdominal pain   Nausea and vomiting   Dehydration   Dyslipidemia   Chronic back pain  Nausea, vomiting and abdominal pain possibly secondary to acute cholelithiasis RUQ U/S showed cholelithiasis without sonographic findings of acute cholecystitis Continue IV Zofran as needed, Protonix Continue IV hydration Consider surgical consult in the morning  Dehydration Continue IV hydration  Essential hypertension Continue home meds  History of atrial fibrillation chronic Continue  Eliquis  Hyperlipidemia Continue home statin  Chronic back pain Continue home gabapentin and hydrocodone  History of depression Continue Zoloft   DVT prophylaxis: Eliquis  Code Status: Full  Family Communication: No family at bedside  Disposition Plan: Plan to discharge home once clinically improved  Consults called: None  Admission status: Inpatient    Bernadette Hoit MD [facility]  If 7PM-7AM, please contact night-coverage [email or web address]  11/02/2018, 2:10 AM

## 2018-11-03 ENCOUNTER — Inpatient Hospital Stay (HOSPITAL_COMMUNITY): Payer: Medicare HMO

## 2018-11-03 ENCOUNTER — Ambulatory Visit: Payer: Medicare HMO | Admitting: Cardiology

## 2018-11-03 DIAGNOSIS — K8010 Calculus of gallbladder with chronic cholecystitis without obstruction: Secondary | ICD-10-CM

## 2018-11-03 LAB — BASIC METABOLIC PANEL
Anion gap: 11 (ref 5–15)
BUN: 15 mg/dL (ref 8–23)
CO2: 25 mmol/L (ref 22–32)
Calcium: 8.6 mg/dL — ABNORMAL LOW (ref 8.9–10.3)
Chloride: 100 mmol/L (ref 98–111)
Creatinine, Ser: 0.66 mg/dL (ref 0.44–1.00)
GFR calc Af Amer: >60 mL/min (ref >60)
GFR calc non Af Amer: >60 mL/min (ref >60)
Glucose, Bld: 169 mg/dL — ABNORMAL HIGH (ref 70–99)
Potassium: 3.4 mmol/L — ABNORMAL LOW (ref 3.5–5.1)
Sodium: 136 mmol/L (ref 135–145)

## 2018-11-03 LAB — CBC WITH DIFFERENTIAL/PLATELET
Abs Immature Granulocytes: 0.09 10*3/uL — ABNORMAL HIGH (ref 0.00–0.07)
Basophils Absolute: 0 10*3/uL (ref 0.0–0.1)
Basophils Relative: 0 %
Eosinophils Absolute: 0 10*3/uL (ref 0.0–0.5)
Eosinophils Relative: 0 %
HCT: 45.6 % (ref 36.0–46.0)
Hemoglobin: 14.7 g/dL (ref 12.0–15.0)
Immature Granulocytes: 1 %
Lymphocytes Relative: 11 %
Lymphs Abs: 1.8 10*3/uL (ref 0.7–4.0)
MCH: 31.1 pg (ref 26.0–34.0)
MCHC: 32.2 g/dL (ref 30.0–36.0)
MCV: 96.4 fL (ref 80.0–100.0)
Monocytes Absolute: 1.6 10*3/uL — ABNORMAL HIGH (ref 0.1–1.0)
Monocytes Relative: 10 %
Neutro Abs: 12.7 10*3/uL — ABNORMAL HIGH (ref 1.7–7.7)
Neutrophils Relative %: 78 %
Platelets: 424 10*3/uL — ABNORMAL HIGH (ref 150–400)
RBC: 4.73 MIL/uL (ref 3.87–5.11)
RDW: 12.3 % (ref 11.5–15.5)
WBC: 16.3 10*3/uL — ABNORMAL HIGH (ref 4.0–10.5)
nRBC: 0 % (ref 0.0–0.2)

## 2018-11-03 MED ORDER — TECHNETIUM TC 99M MEBROFENIN IV KIT
5.4000 | PACK | Freq: Once | INTRAVENOUS | Status: AC | PRN
  Administered 2018-11-03: 5.4 via INTRAVENOUS

## 2018-11-03 MED ORDER — IOHEXOL 300 MG/ML  SOLN: 30.0000 mL | Freq: Once | INTRAMUSCULAR | Status: DC | PRN

## 2018-11-03 MED ORDER — SODIUM CHLORIDE 0.9% FLUSH: 10.0000 mL | INTRAVENOUS | Status: DC | PRN

## 2018-11-03 MED ORDER — SODIUM CHLORIDE 0.9% FLUSH
10.0000 mL | Freq: Two times a day (BID) | INTRAVENOUS | Status: DC
  Administered 2018-11-06 – 2018-11-09 (×3): 10 mL

## 2018-11-03 NOTE — Progress Notes (Signed)
NT entered room.  Patient had removed telemetry and pulled out IV.  Patient redirected and telemetry reapplied.  Mits applied.  Patient now resting in bed.   Daughter called and updated.  Will continue to monitor.

## 2018-11-03 NOTE — Progress Notes (Signed)
Patient had another episode of pain/ discomfort around bedtime. Patient was grabbing at chest but was unable to verbalize what was going on. Blood pressure was also elevated at bedtime. Shortly after patient began vomiting dark Arlon Bleier liquid. NP on call was notified. EKG done and patient was also given a one time dose of IV phenergan and IV morphine as prn's were not yet due and patient was to be NPO after midnight. No changes to EKG. Patient was able to sleep some overnight but remains confused and attempts to get out of bed unassisted. Will continue to closely monitor patient.

## 2018-11-03 NOTE — Progress Notes (Signed)
PROGRESS NOTE  [name] PTW:[id number] DOB: 12-Jun-1925 DOA: 11/01/2018 PCP: Mayra Neer, MD  Brief History   The patient is a 85 yr old woman who presented to the ED on 10/30/2018 with a complaint of mild diffuse abdominal pain. CT abdomen and [place] were negative for cholecystitis. Pt and her daughter preferred to be discharged to home on that occasion, but once they got home, the patient was unable to keep PO down. She returned to the ED on 11/01/2018 with complaints of abdominal pain and nausea and vomiting since 10/29/2018.  The patient has a past medical history significant for dementia, hypertension, atrial fibrillation, breast cancer in remission, chronic back pain, and arthritis. She has recently had an admission for encephalopathy and rhabdomyolysis.   Right upper quadrant ultrasound was negative for ductal dilatation or cholecystitis, but did demonstrated cholelithiasis. HIDA Scan was normal. Patient continues to complain of chest pain, and her WBC is 16.3. Will repeat CXR.  Consultants  . None  Procedures  . None  Antibiotics   Anti-infectives (From admission, onward)   None     .   Subjective  The patient is sleeping. According to nursing she continues to complain of upper abdomen/chest pain. She is not awakened.  Objective   Vitals:  Vitals:   11/03/18 0446 11/03/18 1123  BP: (!) 157/73 (!) 142/53  Pulse: 80 72  Resp: 16 18  Temp: 98.6 F (37 C)   SpO2: 93% 96%    Exam:  Constitutional:  The patient is somnolent. She is not awakened. No acute distress. Respiratory:  . No increased work of breathing. . No wheezes, rales, or rhonchi. . No tactile fremitus. Cardiovascular:  . Rapid rate and irregular rhythm. . No murmurs, ectopy,or gallups. . No lateral PMI. No thrills. Abdomen:  . Abdomen is tender in the right upper quadrant and the epigastrum. It is mildly distended.  . No hernias, masses, or organomegaly. . Hypoactive bowel sound.s  Musculoskeletal:  . No cyanosis, clubbing, or edema Skin:  . No rashes, lesions, ulcers . palpation of skin: no induration or nodules Neurologic:  . CN 2-12 intact . Sensation all 4 extremities intact Psychiatric:  . Mental status o Mood, affect appropriate . judgment and insight are not intact.   I have personally reviewed the following:   Today's Data  . CBC, CMP, Vitals  Cardiology Data  . EKG - atrial fibrillation with RVR.  Scheduled Meds: . apixaban  5 mg Oral BID  . atorvastatin  20 mg Oral Daily  . diltiazem  180 mg Oral Daily  . gabapentin  100 mg Oral QHS  . pantoprazole (PROTONIX) IV  40 mg Intravenous Q24H  . predniSONE  1.25 mg Oral Q breakfast  . sertraline  50 mg Oral q morning - 10a   Continuous Infusions: . sodium chloride 100 mL/hr at 11/03/18 0430  . diltiazem (CARDIZEM) infusion 10 mg/hr (11/02/18 1218)    Principal Problem:   Cholelithiasis Active Problems:   Personal history of malignant neoplasm of breast   Essential hypertension   Atrial fibrillation, chronic   Abdominal pain   Nausea and vomiting   Dehydration   Dyslipidemia   Chronic back pain   Acute cholecystitis   LOS: 2 days    A & P   Atrial Fibrillation with RVR: Patient was started on IV diltiazem as she was unable to keep her PO medications down. Her rate was brought under control, and her drip was discontinued. She is now on  the floor and her oral diltiazem has been restarted. If she is unable to keep it down, she may need to be returned to the drip.  Cholecystitis:  HIDA scan is negative for biliary dyskinesis.Right upper quadrant ultrasound was unremarkable except for the presence of stones. No CBD dilatation. I will order a CXR and repeat CT abdomen and pelvis.  Abdominal pain: Continue hydrocodone for pain control.  I will order a CXR and repeat CT abdomen and pelvis.  Nausea and vomiting: Appears resolved. Antiemetics and IV fluids ordered. The patient is now  requesting PO. Clear liquid diet. Advance diet as tolerated.  Dehydration: Continue IV fluids. Monitor volume status.  Dyslipidemia: Statin is on hold.  Chronic back pain: Continue home pain meds as possible.  I have seen and examined this patient myself. I have spent 32 minutes in her evaluation and care,.  DVT prophylaxis: Eliquis Code Status: Full Code Family Communication: I spoke with both of the patient's daughters today, Guadelupe Sabin (POA), and [name] Disposition Plan: tbd   Karie Kirks, DO [facility] Direct contact: see [email or web address]  7PM-7AM contact night coverage as above 11/03/2018, 3:53 PM  LOS: 1 day

## 2018-11-03 NOTE — Progress Notes (Signed)
Manufacturing engineer [facility]) Hospital Liaison RN not:?  ? This patient was recently enrolled in our Palliative Care program. She was scheduled for her first visit but was admitted to the hospital before that took place.?  ? [facility] will continue to follow while hospitalized. Our Palliative team will follow up with her after discharge.?  ? If you have any questions please contact us.?  ? Thank you,?  ? Gar Ponto, RN [facility] Liaison (listed on AMION)? [phone]?

## 2018-11-04 ENCOUNTER — Inpatient Hospital Stay (HOSPITAL_COMMUNITY): Payer: Medicare HMO

## 2018-11-04 DIAGNOSIS — K449 Diaphragmatic hernia without obstruction or gangrene: Principal | ICD-10-CM

## 2018-11-04 LAB — CBC WITH DIFFERENTIAL/PLATELET
Abs Immature Granulocytes: 0.03 10*3/uL (ref 0.00–0.07)
Basophils Absolute: 0 10*3/uL (ref 0.0–0.1)
Basophils Relative: 0 %
Eosinophils Absolute: 0.1 10*3/uL (ref 0.0–0.5)
Eosinophils Relative: 1 %
HCT: 38.8 % (ref 36.0–46.0)
Hemoglobin: 12.3 g/dL (ref 12.0–15.0)
Immature Granulocytes: 0 %
Lymphocytes Relative: 18 %
Lymphs Abs: 2.1 10*3/uL (ref 0.7–4.0)
MCH: 30.6 pg (ref 26.0–34.0)
MCHC: 31.7 g/dL (ref 30.0–36.0)
MCV: 96.5 fL (ref 80.0–100.0)
Monocytes Absolute: 1.2 10*3/uL — ABNORMAL HIGH (ref 0.1–1.0)
Monocytes Relative: 11 %
Neutro Abs: 7.8 10*3/uL — ABNORMAL HIGH (ref 1.7–7.7)
Neutrophils Relative %: 70 %
Platelets: 294 10*3/uL (ref 150–400)
RBC: 4.02 MIL/uL (ref 3.87–5.11)
RDW: 12.5 % (ref 11.5–15.5)
WBC: 11.3 10*3/uL — ABNORMAL HIGH (ref 4.0–10.5)
nRBC: 0 % (ref 0.0–0.2)

## 2018-11-04 LAB — COMPREHENSIVE METABOLIC PANEL
ALT: 24 U/L (ref 0–44)
AST: 21 U/L (ref 15–41)
Albumin: 2.8 g/dL — ABNORMAL LOW (ref 3.5–5.0)
Alkaline Phosphatase: 49 U/L (ref 38–126)
Anion gap: 9 (ref 5–15)
BUN: 10 mg/dL (ref 8–23)
CO2: 22 mmol/L (ref 22–32)
Calcium: 7.9 mg/dL — ABNORMAL LOW (ref 8.9–10.3)
Chloride: 105 mmol/L (ref 98–111)
Creatinine, Ser: 0.56 mg/dL (ref 0.44–1.00)
GFR calc Af Amer: >60 mL/min (ref >60)
GFR calc non Af Amer: >60 mL/min (ref >60)
Glucose, Bld: 121 mg/dL — ABNORMAL HIGH (ref 70–99)
Potassium: 3.3 mmol/L — ABNORMAL LOW (ref 3.5–5.1)
Sodium: 136 mmol/L (ref 135–145)
Total Bilirubin: 0.8 mg/dL (ref 0.3–1.2)
Total Protein: 5.5 g/dL — ABNORMAL LOW (ref 6.5–8.1)

## 2018-11-04 MED ORDER — DILTIAZEM HCL 30 MG PO TABS
30.0000 mg | ORAL_TABLET | Freq: Once | ORAL | Status: AC
  Administered 2018-11-04: 30 mg via ORAL
  Filled 2018-11-04: qty 1

## 2018-11-04 NOTE — Progress Notes (Signed)
PROGRESS NOTE  AMANII SNETHEN MMN:[id number] DOB: 08-12-1925 DOA: 11/01/2018 PCP: Mayra Neer, MD  Brief History   The patient is a 85 yr old woman who presented to the ED on 10/30/2018 with a complaint of mild diffuse abdominal pain. CT abdomen and [place] were negative for cholecystitis. Pt and her daughter preferred to be discharged to home on that occasion, but once they got home, the patient was unable to keep PO down. She returned to the ED on 11/01/2018 with complaints of abdominal pain and nausea and vomiting since 10/29/2018.  The patient has a past medical history significant for dementia, hypertension, atrial fibrillation, breast cancer in remission, chronic back pain, and arthritis. She has recently had an admission for encephalopathy and rhabdomyolysis.   Right upper quadrant ultrasound was negative for ductal dilatation or cholecystitis, but did demonstrated cholelithiasis. HIDA Scan was normal. Patient continues to complain of chest pain, and her WBC is 16.3. Will repeat CXR.  I have consulted gastroenterology: UGI demosntrates a hiatal hernia. GI has stated that perhaps this is intermittently being torsed. He feels that surgery to correct this and remove her gallbladder would be inadvisable due to the patient's advanced age and multiple comorbidities. He has recommended a trial of a full liquid diet and consideration of a feeding tube in the future should she continue to have regurgitation with meals.  Consultants  . Gastroenterology  Procedures  . None  Antibiotics   Anti-infectives (From admission, onward)   None     .   Subjective  The patient is sitting up in bed. She is resting comfortably.  Objective   Vitals:  Vitals:   11/04/18 1547 11/04/18 1611  BP:  (!) 124/53  Pulse: 98   Resp:    Temp:    SpO2:      Exam:  Constitutional:  The patient is awake and alert. No acute distress. Respiratory:  . No increased work of breathing. . No wheezes, rales, or  rhonchi. . No tactile fremitus. Cardiovascular:  . Rapid rate and irregular rhythm. . No murmurs, ectopy,or gallups. . No lateral PMI. No thrills. Abdomen:  . Abdomen is tender in the epigastrum. It is mildly distended.  . No hernias, masses, or organomegaly. . Hypoactive bowel sound.s Musculoskeletal:  . No cyanosis, clubbing, or edema Skin:  . No rashes, lesions, ulcers . palpation of skin: no induration or nodules Neurologic:  . CN 2-12 intact . Sensation all 4 extremities intact Psychiatric:  . Mental status o Mood, affect appropriate . judgment and insight are not intact.   I have personally reviewed the following:   Today's Data  . CBC, CMP, Vitals  Cardiology Data  . EKG - atrial fibrillation with RVR.  Scheduled Meds: . atorvastatin  20 mg Oral Daily  . diltiazem  180 mg Oral Daily  . gabapentin  100 mg Oral QHS  . pantoprazole (PROTONIX) IV  40 mg Intravenous Q24H  . sertraline  50 mg Oral q morning - 10a  . sodium chloride flush  10-40 mL Intracatheter Q12H   Continuous Infusions: . sodium chloride 100 mL/hr at 11/04/18 1455    Principal Problem:   Cholelithiasis Active Problems:   Personal history of malignant neoplasm of breast   Essential hypertension   Atrial fibrillation, chronic   Abdominal pain   Nausea and vomiting   Dehydration   Dyslipidemia   Chronic back pain   Acute cholecystitis   LOS: 3 days    A & P  Atrial Fibrillation with RVR: Patient was initially started on IV diltiazem as she was unable to keep her PO medications down. Her rate was brought under control, and her drip was discontinued. She is now on the floor and her oral diltiazem has been restarted. This morning her heart rate remained high after she took her daily diltiazem. Will give an additional dose of 30 mg of diltiazem.  Cholecystitis:  HIDA scan is negative for biliary dyskinesis.Right upper quadrant ultrasound was unremarkable except for the presence of stones.  No CBD dilatation. I will order a CXR and repeat CT abdomen and pelvis. Surgery would be a poor option for this patient.  Abdominal pain: Seems intermittent. Possibly due to intermittent episodes of torsion as suggested by Dr. Cristina Gong.Surgery to reduce her hernia would introduce a high level of risk to this patient.  Nausea and vomiting: Appears resolved. Antiemetics and IV fluids ordered. The patient is now requesting PO. Clear liquid diet. Advance diet as tolerated.  Dehydration: Continue IV fluids. Monitor volume status.  Dyslipidemia: Statin is on hold.  Chronic back pain: Continue home pain meds as possible.  I have seen and examined this patient myself. I have spent 35 minutes in her evaluation and care,.  DVT prophylaxis: Eliquis Code Status: Full Code Family Communication: I spoke with both of the patient's daughters today, Guadelupe Sabin (POA), and [name] Disposition Plan: tbd   Karie Kirks, DO [facility] Direct contact: see [email or web address]  7PM-7AM contact night coverage as above 11/04/2018, 5:08 PM  LOS: 1 day

## 2018-11-04 NOTE — Care Management Important Message (Signed)
Important Message  Patient Details IM Letter given to Dessa Phi RN to present to the Patient Name: [name] MRN: [id number] Date of Birth: Jan 25, 1926   Medicare Important Message Given:  Yes     Kerin Salen 11/04/2018, 10:13 AM

## 2018-11-04 NOTE — Progress Notes (Signed)
   11/04/18 1224  Vitals  Temp 98.7 F (37.1 C)  Temp Source Oral  BP (!) 143/83  MAP (mmHg) 100  BP Location Right Leg  BP Method Automatic  Patient Position (if appropriate) Lying  Pulse Rate (!) 110  Pulse Rate Source Monitor  Resp 16  Oxygen Therapy  SpO2 96 %  O2 Device Room Air  Dr. Benny Lennert notified via text page of patient's BP and pulse - Afib.  Patient resting in bed.  Will continue to monitor.

## 2018-11-04 NOTE — Progress Notes (Signed)
Patient tolerated sips of water today.  Patient then drank a container of apple juice over a few hours.  RN was in room and patient handed nurse a cup and stated she had spit this up.  Apple juice was in cup - Approximately 75 ml.  Patient then stated, "I need to spit again."  Patient spit up a small amount more of apple juice.  Zofran given.  Will continue to monitor.

## 2018-11-04 NOTE — Progress Notes (Signed)
Patient's heart rate in 110s-120s.  Dr. Benny Lennert gave order that patient may have morning Diltiazem dose with a sip of water.

## 2018-11-04 NOTE — Consult Note (Signed)
Referring Provider:  Dr. Domenica Reamer Primary Care Physician:  Mayra Neer, MD Primary Gastroenterologist: None (unassigned)  Reason for Consultation: Chest and epigastric pain.  Feeding problems.  HPI: [name] is a 85 y.o. female with recent onset dementia and a roughly 2 to 3-week history of intermittent sharp chest pain below the right breast, not related to meals.  Because of the patient's dementia, the history is obtained primarily from the patient's daughter, also from chart review.  The patient had been living independently at a retirement center, ambulatory with a walker, but was found on the floor with lots of bruises on July 6 and brought to the hospital by EMS.  No fractures identified.  She did have rhabdomyolysis.  According to the patient's daughter, since then, there has been a marked worsening of the patient's cognitive function.  She was brought to the hospital and admitted 2 days ago because of the above-mentioned episodic pain, and also inability to eat; she was not eating much, but whenever she would eat, would come back up.  She does have known gallstones but a hepatobiliary scan yesterday was normal, with a normal gallbladder ejection fraction.  A CT scan has shown a moderate hiatal hernia with some questionable soft tissue impingement on the proximal stomach, although these findings were less pronounced on the current study than the previous one.  At my request, the patient underwent a limited upper GI series this morning, which again demonstrated the moderate sized hiatal hernia.  I have reviewed the films.  It does not appear to be a paraesophageal hiatal hernia.  Contrast flows adequately from the esophagus to the hiatal hernia pouch and into the stomach although gastric emptying of contrast was not able to be confirmed on this limited study.   Past Medical History:  Diagnosis Date  . Arthritis    low spine  . Breast cancer [facility]) 2013   right breast  .  Breast cancer, right breast, recurrent 06/03/2011  . Cancer [facility]) 1989   breast right  . Chronic back pain   . Hypertension     Past Surgical History:  Procedure Laterality Date  . ABDOMINAL HYSTERECTOMY  1974  . BREAST SURGERY  1989   rt lumpectomy-axillary dissection  . COLONOSCOPY    . EYE SURGERY     both cataracts  . JOINT REPLACEMENT  2004, 2005   both knees   . MASTECTOMY Right 06/11/11   right breast  . SHOULDER SURGERY     patient does not remember date of procedure    Prior to Admission medications   Medication Sig Start Date End Date Taking? Authorizing Provider  acetaminophen (TYLENOL) 500 MG tablet Take 500-1,000 mg by mouth every 8 (eight) hours as needed (for arthritis pain).   Yes [provider]  diltiazem (CARDIZEM CD) 180 MG 24 hr capsule Take 1 capsule (180 mg total) by mouth daily. Patient taking differently: Take 180 mg by mouth every evening.  10/19/18 12/18/18 Yes Amin, Ankit Chirag, MD  ELIQUIS 5 MG TABS tablet TAKE 1 TABLET BY MOUTH TWICE A DAY Patient taking differently: Take 5 mg by mouth 2 (two) times daily.  07/14/18  Yes Croitoru, Mihai, MD  HYDROcodone-acetaminophen (NORCO/VICODIN) 5-325 MG tablet Take 1 tablet by mouth every 8 (eight) hours as needed for pain. 01/25/18  Yes [provider]  losartan (COZAAR) 25 MG tablet TAKE 2 TABLETS BY MOUTH (50MG  TOTAL) EVERY DAY PT NEEDS OFFICE VISIT Patient taking differently: Take 25 mg by  mouth 2 (two) times a day.  10/28/18  Yes Kilroy, Doreene Burke, PA-C  Multiple Vitamins-Minerals (CENTRUM SILVER 50+WOMEN PO) Take 1 tablet by mouth daily with breakfast.   Yes [provider]  nitroGLYCERIN (NITROSTAT) 0.4 MG SL tablet Place 1 tablet (0.4 mg total) under the tongue every 5 (five) minutes x 3 doses as needed for chest pain. 09/13/16  Yes Kilroy, Luke K, PA-C  ondansetron (ZOFRAN) 4 MG tablet Take 1 tablet (4 mg total) by mouth every 8 (eight) hours as needed for nausea or vomiting. 10/29/18  Yes  Benay Pike, MD  predniSONE (DELTASONE) 2.5 MG tablet Take 1.25 mg by mouth daily.  12/27/17  Yes [provider]  sertraline (ZOLOFT) 50 MG tablet Take 50 mg by mouth every morning.  02/04/18  Yes [provider]  vitamin C (ASCORBIC ACID) 500 MG tablet Take 500 mg by mouth daily.   Yes [provider]  VITAMIN E PO Take 1,000 Units by mouth daily.    Yes [provider]  atorvastatin (LIPITOR) 20 MG tablet TAKE 1 TABLET BY MOUTH EVERY DAY Patient not taking: No sig reported 09/21/17   Croitoru, Mihai, MD  gabapentin (NEURONTIN) 100 MG capsule Take 100 mg by mouth at bedtime.  04/03/11   [provider]  RABEprazole (ACIPHEX) 20 MG tablet TAKE 1 TABLET BY MOUTH EVERY DAY Patient not taking: No sig reported 09/21/17   Croitoru, Dani Gobble, MD    Current Facility-Administered Medications  Medication Dose Route Frequency Provider Last Rate Last Dose  . 0.9 %  sodium chloride infusion   Intravenous Continuous Swayze, Ava, DO 100 mL/hr at 11/04/18 0322    . atorvastatin (LIPITOR) tablet 20 mg  20 mg Oral Daily Adefeso, Oladapo, DO   20 mg at 11/03/18 2201  . diltiazem (CARDIZEM CD) 24 hr capsule 180 mg  180 mg Oral Daily Swayze, Ava, DO   180 mg at 11/04/18 1027  . gabapentin (NEURONTIN) capsule 100 mg  100 mg Oral QHS Adefeso, Oladapo, DO   100 mg at 11/03/18 2201  . HYDROcodone-acetaminophen (NORCO/VICODIN) 5-325 MG per tablet 1 tablet  1 tablet Oral Q6H PRN Adefeso, Oladapo, DO   1 tablet at 11/02/18 1855  . iohexol (OMNIPAQUE) 300 MG/ML solution 30 mL  30 mL Oral Once PRN Swayze, Ava, DO      . ondansetron (ZOFRAN) injection 4 mg  4 mg Intravenous Q6H PRN Adefeso, Oladapo, DO   4 mg at 11/02/18 1855  . pantoprazole (PROTONIX) injection 40 mg  40 mg Intravenous Q24H Adefeso, Oladapo, DO   40 mg at 11/04/18 0529  . sertraline (ZOLOFT) tablet 50 mg  50 mg Oral q morning - 10a Adefeso, Oladapo, DO      . sodium chloride flush (NS) 0.9 % injection 10-40 mL   10-40 mL Intracatheter Q12H Swayze, Ava, DO      . sodium chloride flush (NS) 0.9 % injection 10-40 mL  10-40 mL Intracatheter PRN Swayze, Ava, DO        Allergies as of 11/01/2018 - Review Complete 11/01/2018  Allergen Reaction Noted  . Arimidex [anastrozole] Other (See Comments) 09/04/2011  . Fish allergy Other (See Comments) 10/17/2018  . Morphine and related Nausea And Vomiting 06/10/2011  . Shellfish allergy Other (See Comments) 10/17/2018  . Vicodin [hydrocodone-acetaminophen] Nausea Only 09/12/2016    Family History  Problem Relation Age of Onset  . Heart disease Mother   . Breast cancer Maternal Aunt   . Cancer Brother  unaware  . Heart disease Brother     Social History   Socioeconomic History  . Marital status: Married    Spouse name: Not on file  . Number of children: Not on file  . Years of education: Not on file  . Highest education level: Not on file  Occupational History  . Not on file  Social Needs  . Financial resource strain: Not on file  . Food insecurity    Worry: Not on file    Inability: Not on file  . Transportation needs    Medical: Not on file    Non-medical: Not on file  Tobacco Use  . Smoking status: Former Smoker    Quit date: 05/11/1970    Years since quitting: 48.5  . Smokeless tobacco: Never Used  Substance and Sexual Activity  . Alcohol use: No  . Drug use: No  . Sexual activity: Not on file  Lifestyle  . Physical activity    Days per week: Not on file    Minutes per session: Not on file  . Stress: Not on file  Relationships  . Social Herbalist on phone: Not on file    Gets together: Not on file    Attends religious service: Not on file    Active member of club or organization: Not on file    Attends meetings of clubs or organizations: Not on file    Relationship status: Not on file  . Intimate partner violence    Fear of current or ex partner: Not on file    Emotionally abused: Not on file    Physically  abused: Not on file    Forced sexual activity: Not on file  Other Topics Concern  . Not on file  Social History Narrative  . Not on file    Review of Systems: From discussion with the patient's daughter, it does not sound as though the patient has exertional chest pain (although there is a history of atrial fibrillation), breathing difficulties, unusual skin rashes, urinary symptoms, lower extremity edema, or joint effusions other than some right knee swelling in recent months which is likely due to arthritis.  Regurgitation, chest pain, and cognitive dysfunction as per HPI.  Physical Exam: Vital signs in last 24 hours: Temp:  [98.4 F (36.9 C)-99.3 F (37.4 C)] 98.7 F (37.1 C) (07/24 1224) Pulse Rate:  [71-110] 110 (07/24 1224) Resp:  [16-18] 16 (07/24 1224) BP: (126-143)/(52-83) 143/83 (07/24 1224) SpO2:  [93 %-97 %] 96 % (07/24 1224)   This is a pleasant elderly Caucasian female lying in bed in no acute distress.  She is awake and alert, but fairly incoherent.  She was able to tell me her name but then started rambling, perhaps about her maiden name.  She did not follow simple commands, such as being asked to take deep breaths.  The skin is without rashes, there is no overt pallor or icterus, no conjunctival pallor, no obvious respiratory distress or obvious lung wheezes, normal heart sounds, abdomen is soft and without organomegaly, guarding, mass-effect, or tenderness.  No evident focal neurologic deficits.  No lower extremity edema, clubbing, or cyanosis.  Intake/Output from previous day: 07/23 0701 - 07/24 0700 In: 2031.4 [I.V.:2031.4] Out: 1150 [Urine:1150] Intake/Output this shift: No intake/output data recorded.  Lab Results: Recent Labs    11/01/18 1820 11/03/18 0444 11/04/18 0412  WBC 17.5* 16.3* 11.3*  HGB 15.0 14.7 12.3  HCT 45.1 45.6 38.8  PLT 422* 424* 294  BMET Recent Labs    11/02/18 1842 11/03/18 0444 11/04/18 0412  NA 132* 136 136  K 3.7 3.4* 3.3*   CL 97* 100 105  CO2 23 25 22   GLUCOSE 174* 169* 121*  BUN 17 15 10   CREATININE 0.72 0.66 0.56  CALCIUM 8.5* 8.6* 7.9*   LFT Recent Labs    11/04/18 0412  PROT 5.5*  ALBUMIN 2.8*  AST 21  ALT 24  ALKPHOS 49  BILITOT 0.8   PT/INR No results for input(s): LABPROT, INR in the last 72 hours.  Studies/Results: Ct Abdomen Pelvis Wo Contrast  Result Date: 11/03/2018 CLINICAL DATA:  Mild diffuse abdominal pain, seen on 10/29/2018 with negative CT and ultrasound exams, went home, unable to keep anything down, returns with abdominal pain, nausea and vomiting EXAM: CT ABDOMEN AND PELVIS WITHOUT CONTRAST TECHNIQUE: Multidetector CT imaging of the abdomen and pelvis was performed following the standard protocol without IV contrast. Sagittal and coronal MPR images reconstructed from axial data set. No oral contrast administered. Bibasilar pleural effusions and compressive atelectasis of the lungs. Calcified gallstones within gallbladder. Gallbladder and liver otherwise normal appearance. COMPARISON:  10/29/2018 FINDINGS: Lower chest: Bibasilar pleural effusions and compressive atelectasis of the lower lungs. Hepatobiliary: Calcified gallstones within gallbladder. Gallbladder and liver otherwise normal appearance. Pancreas: Normal appearance Spleen: Normal appearance Adrenals/Urinary Tract: Thickening of adrenal glands without discrete mass. Kidneys and ureters unremarkable. Bladder distended without focal abnormality Stomach/Bowel: Appendix not definitely visualized. Large hiatal hernia with questionable wall thickening of the stomach in the hiatal hernia, less prominent on previous study. Large duodenal diverticulum. Sigmoid diverticulosis without evidence of diverticulitis. Remaining bowel loops normal appearance. Vascular/Lymphatic: Atherosclerotic calcifications aorta and iliac arteries without aneurysm. No adenopathy. Reproductive: Uterus surgically absent.  Atrophic ovaries. Other: No free air or  free fluid. No hernia or definite inflammatory process. Musculoskeletal: Osseous demineralization with multilevel degenerative disc and facet disease changes of the thoracolumbar spine. IMPRESSION: Cholelithiasis. Sigmoid diverticulosis without definite evidence of diverticulitis. Large hiatal hernia with wall thickening of the stomach in the hernia sac, more prominent than on the preceding study, favor artifact from underdistention but if patient has persistent symptoms recommend endoscopic assessment to exclude causes of gastric wall thickening including neoplasm. Small bibasilar pleural effusions and bibasilar atelectasis. Electronically Signed   By: Lavonia Dana M.D.   On: 11/03/2018 19:37   Nm Hepato W/eject Fract  Result Date: 11/03/2018 CLINICAL DATA:  Right upper quadrant pain EXAM: NUCLEAR MEDICINE HEPATOBILIARY IMAGING WITH GALLBLADDER EF TECHNIQUE: Sequential images of the abdomen were obtained out to 60 minutes following intravenous administration of radiopharmaceutical. After oral ingestion of Ensure, gallbladder ejection fraction was determined. At 60 min, normal ejection fraction is greater than 33%. RADIOPHARMACEUTICALS:  5.4 mCi Tc-26m  Choletec IV COMPARISON:  None. FINDINGS: Prompt uptake and biliary excretion of activity by the liver is seen. Gallbladder activity is visualized, consistent with patency of cystic duct. Biliary activity passes into small bowel, consistent with patent common bile duct. Calculated gallbladder ejection fraction is 46%. (Normal gallbladder ejection fraction with Ensure is greater than 33%.) IMPRESSION: Normal study. Electronically Signed   By: Rolm Baptise M.D.   On: 11/03/2018 12:21   Dg Chest Port 1 View  Result Date: 11/03/2018 CLINICAL DATA:  Chest pain EXAM: PORTABLE CHEST 1 VIEW COMPARISON:  10/17/2018 FINDINGS: Cardiac shadow is stable. Aortic calcifications are again seen. New bibasilar atelectatic changes are noted left greater than right without sizable  effusion. No bony abnormality is noted. IMPRESSION: Bibasilar atelectatic changes  left greater than right. Electronically Signed   By: Inez Catalina M.D.   On: 11/03/2018 17:38   Dg Duanne Limerick W Single Cm (sol Or Thin Ba)  Result Date: 11/04/2018 CLINICAL DATA:  Epigastric and right lower quadrant pain. Nausea. Reflux. EXAM: UPPER GI SERIES WITH KUB TECHNIQUE: After obtaining a scout radiograph a routine upper GI series was performed using thin barium FLUOROSCOPY TIME:  Fluoroscopy Time:  4 minutes and 0 seconds Radiation Exposure Index (if provided by the fluoroscopic device): 62.9 mGy Number of Acquired Spot Images: 0 COMPARISON:  CT of 1 day prior FINDINGS: Severely limited exam, secondary to patient clinical status, immobility, and difficulty following directions. Exam was performed supine. Preprocedure scout film demonstrates abdominal aortic atherosclerosis. Nonobstructive bowel-gas pattern. With attempts at successive swallows, the esophagus is grossly normal. A moderate hiatal hernia. Best identified on the second series is mass-effect upon the left/lateral side of the gastric lumen within the herniated stomach. This corresponds to soft tissue fullness on the prior CT. No high-grade obstruction. Despite multiple maneuvers, contrast could not be passed out of the stomach and into the proximal small bowel. IMPRESSION: 1. Severely degraded exam, as detailed above. 2. Moderate hiatal hernia. 3. Redemonstration of suggestion of soft tissue fullness, as evidenced by mass-effect, along the left/lateral wall of the herniated stomach. Cannot exclude mass or less likely gastritis in this area. Consider endoscopy. Electronically Signed   By: Abigail Miyamoto M.D.   On: 11/04/2018 11:30    Impression: 1.  Recurring chest pain, resolved (per discussion with patient's nurse) over the past couple of days. 2.  History of regurgitation of attempted food intake.  Also, diminished food intake. 3.  Large hiatal hernia with  approximately one quarter of the stomach up in the chest, but without obvious obstruction at the time of the study. 4.  Cholelithiasis  Discussion: I wonder if the patient is having intermittent pocketing of food within the hiatal hernia pouch, or perhaps even torsion of the hiatal hernia.  This does not sound like symptomatic cholelithiasis or biliary colic.   Plan: I had a 26-minute discussion with the patient's daughter on telephone about options for management and goals of care, which she has already started to discuss with her sisters.    Specifically, I have recommended we start with attempts at a full liquid diet and perhaps advance to frequent small feedings.  We will continue to monitor for recurrence of her chest pain.    Depending on how her case evolves, consideration could be given to placement of a feeding tube (might have to be done under fluoroscopy guidance in view of the large hiatal hernia), and ultimately perhaps even a gastrostomy tube.    Finally, I discussed the theoretical option of surgery for correction of the hiatal hernia and removal of the gallbladder, although it is very uncertain whether doing so would remedy the patient's symptoms, and it would be at very high risk for complications in view of the patient's age and cognitive dysfunction, so I strongly advised against this option.   LOS: 3 days   Youlanda Mighty Mateus Rewerts  11/04/2018, 2:01 PM   Pager [phone] If no answer or after 5 PM call [phone]

## 2018-11-04 NOTE — Progress Notes (Signed)
Spoke to patient's daughter, [name], this afternoon and given updates.  Patient's daughter was able to speak to her mother.   Patient now resting in bed.

## 2018-11-04 NOTE — Progress Notes (Signed)
Patient ate jello and sip of milk for evening meal.  Patient c/o throat pain when swallowing and told NT, "I need a trash can."  Patient vomited up what appeared to be the Jello.  Zofran had been previously been administered at 1706.  Dr. Benny Lennert notified via text page.  Will continue to monitor.

## 2018-11-05 LAB — CBC WITH DIFFERENTIAL/PLATELET
Abs Immature Granulocytes: 0.04 10*3/uL (ref 0.00–0.07)
Basophils Absolute: 0 10*3/uL (ref 0.0–0.1)
Basophils Relative: 0 %
Eosinophils Absolute: 0.1 10*3/uL (ref 0.0–0.5)
Eosinophils Relative: 1 %
HCT: 39.4 % (ref 36.0–46.0)
Hemoglobin: 12.8 g/dL (ref 12.0–15.0)
Immature Granulocytes: 0 %
Lymphocytes Relative: 14 %
Lymphs Abs: 1.7 10*3/uL (ref 0.7–4.0)
MCH: 31.2 pg (ref 26.0–34.0)
MCHC: 32.5 g/dL (ref 30.0–36.0)
MCV: 96.1 fL (ref 80.0–100.0)
Monocytes Absolute: 1.3 10*3/uL — ABNORMAL HIGH (ref 0.1–1.0)
Monocytes Relative: 11 %
Neutro Abs: 9.2 10*3/uL — ABNORMAL HIGH (ref 1.7–7.7)
Neutrophils Relative %: 74 %
Platelets: 351 10*3/uL (ref 150–400)
RBC: 4.1 MIL/uL (ref 3.87–5.11)
RDW: 12.7 % (ref 11.5–15.5)
WBC: 12.4 10*3/uL — ABNORMAL HIGH (ref 4.0–10.5)
nRBC: 0 % (ref 0.0–0.2)

## 2018-11-05 LAB — COMPREHENSIVE METABOLIC PANEL
ALT: 24 U/L (ref 0–44)
AST: 17 U/L (ref 15–41)
Albumin: 2.7 g/dL — ABNORMAL LOW (ref 3.5–5.0)
Alkaline Phosphatase: 56 U/L (ref 38–126)
Anion gap: 10 (ref 5–15)
BUN: 10 mg/dL (ref 8–23)
CO2: 22 mmol/L (ref 22–32)
Calcium: 7.9 mg/dL — ABNORMAL LOW (ref 8.9–10.3)
Chloride: 105 mmol/L (ref 98–111)
Creatinine, Ser: 0.6 mg/dL (ref 0.44–1.00)
GFR calc Af Amer: >60 mL/min (ref >60)
GFR calc non Af Amer: >60 mL/min (ref >60)
Glucose, Bld: 140 mg/dL — ABNORMAL HIGH (ref 70–99)
Potassium: 2.6 mmol/L — CL (ref 3.5–5.1)
Sodium: 137 mmol/L (ref 135–145)
Total Bilirubin: 0.8 mg/dL (ref 0.3–1.2)
Total Protein: 5.6 g/dL — ABNORMAL LOW (ref 6.5–8.1)

## 2018-11-05 LAB — MAGNESIUM: Magnesium: 1.9 mg/dL (ref 1.7–2.4)

## 2018-11-05 LAB — GROUP A STREP BY PCR: Group A Strep by PCR: NOT DETECTED

## 2018-11-05 LAB — POTASSIUM: Potassium: 3.1 mmol/L — ABNORMAL LOW (ref 3.5–5.1)

## 2018-11-05 MED ORDER — HYOSCYAMINE SULFATE 0.125 MG SL SUBL
0.2500 mg | SUBLINGUAL_TABLET | Freq: Once | SUBLINGUAL | Status: AC
  Administered 2018-11-05: 0.25 mg via SUBLINGUAL
  Filled 2018-11-05: qty 2

## 2018-11-05 MED ORDER — PANTOPRAZOLE SODIUM 40 MG PO TBEC
40.0000 mg | DELAYED_RELEASE_TABLET | Freq: Every day | ORAL | Status: DC
  Administered 2018-11-06 – 2018-11-10 (×5): 40 mg via ORAL
  Filled 2018-11-05 (×5): qty 1

## 2018-11-05 MED ORDER — ALUM & MAG HYDROXIDE-SIMETH 200-200-20 MG/5ML PO SUSP
30.0000 mL | Freq: Once | ORAL | Status: AC
  Administered 2018-11-05: 30 mL via ORAL
  Filled 2018-11-05: qty 30

## 2018-11-05 MED ORDER — POTASSIUM CHLORIDE 10 MEQ/100ML IV SOLN
10.0000 meq | INTRAVENOUS | Status: AC
  Administered 2018-11-05 (×4): 10 meq via INTRAVENOUS
  Filled 2018-11-05 (×4): qty 100

## 2018-11-05 MED ORDER — LIDOCAINE VISCOUS HCL 2 % MT SOLN
15.0000 mL | Freq: Once | OROMUCOSAL | Status: AC
  Administered 2018-11-05: 15 mL via ORAL
  Filled 2018-11-05: qty 15

## 2018-11-05 NOTE — Progress Notes (Signed)
CRITICAL VALUE ALERT  Critical Value:  K 2.6  Date & Time Notied:  7/25 0735  Provider Notified: Dr. Benny Lennert via Text Page  Orders Received/Actions taken: awaiting orders

## 2018-11-05 NOTE — Progress Notes (Signed)
Subjective: Reports less chest pain and epigastric pain. Reports tolerating full liquids yesterday  Objective: Vital signs in last 24 hours: Temp:  [98 F (36.7 C)-98.7 F (37.1 C)] 98 F (36.7 C) (07/25 0523) Pulse Rate:  [56-110] 56 (07/25 0523) Resp:  [16-18] 18 (07/25 0523) BP: (124-155)/(41-83) 155/41 (07/25 0523) SpO2:  [95 %-96 %] 95 % (07/25 0523) Weight change:     PE: GEN:  NAD, chronic dementia ABD:  Soft, non-tender  Lab Results: CBC    Component Value Date/Time   WBC 12.4 (H) 11/05/2018 0412   RBC 4.10 11/05/2018 0412   HGB 12.8 11/05/2018 0412   HGB 12.5 05/25/2016 1055   HCT 39.4 11/05/2018 0412   HCT 42.1 10/18/2018 1423   HCT 37.6 05/25/2016 1055   PLT 351 11/05/2018 0412   PLT 205 05/25/2016 1055   MCV 96.1 11/05/2018 0412   MCV 95.4 05/25/2016 1055   MCH 31.2 11/05/2018 0412   MCHC 32.5 11/05/2018 0412   RDW 12.7 11/05/2018 0412   RDW 13.0 05/25/2016 1055   LYMPHSABS 1.7 11/05/2018 0412   LYMPHSABS 1.9 05/25/2016 1055   MONOABS 1.3 (H) 11/05/2018 0412   MONOABS 0.9 05/25/2016 1055   EOSABS 0.1 11/05/2018 0412   EOSABS 0.1 05/25/2016 1055   BASOSABS 0.0 11/05/2018 0412   BASOSABS 0.0 05/25/2016 1055   CMP     Component Value Date/Time   NA 137 11/05/2018 0412   NA 137 10/01/2016 1449   NA 142 05/25/2016 1055   K 2.6 (LL) 11/05/2018 0412   K 3.8 05/25/2016 1055   CL 105 11/05/2018 0412   CL 101 03/31/2012 1250   CO2 22 11/05/2018 0412   CO2 28 05/25/2016 1055   GLUCOSE 140 (H) 11/05/2018 0412   GLUCOSE 219 (H) 05/25/2016 1055   GLUCOSE 114 (H) 03/31/2012 1250   BUN 10 11/05/2018 0412   BUN 11 10/01/2016 1449   BUN 12.3 05/25/2016 1055   CREATININE 0.60 11/05/2018 0412   CREATININE 0.9 05/25/2016 1055   CALCIUM 7.9 (L) 11/05/2018 0412   CALCIUM 9.5 05/25/2016 1055   PROT 5.6 (L) 11/05/2018 0412   PROT 6.8 05/25/2016 1055   ALBUMIN 2.7 (L) 11/05/2018 0412   ALBUMIN 3.6 05/25/2016 1055   AST 17 11/05/2018 0412   AST 18  05/25/2016 1055   ALT 24 11/05/2018 0412   ALT 17 05/25/2016 1055   ALKPHOS 56 11/05/2018 0412   ALKPHOS 67 05/25/2016 1055   BILITOT 0.8 11/05/2018 0412   BILITOT 0.36 05/25/2016 1055   GFRNONAA >60 11/05/2018 0412   GFRAA >60 11/05/2018 0412   Assessment:  1.  Recurring chest and epigastric discomfort. 2.  Food regurgitation. 3.  Feeding difficulties. 4.  Gallstones. 5.  Abnormal imaging (UGI series, moderate-sized hiatal hernia; gastric soft tissue fullness?).  Plan:  1.  Given patient's age and comorbidities, she is poor operative candidate; thus, while I'm not convinced (and in fact don't think) her GI symptoms are from biliary colic, nevertheless she would be poor candidate for cholecystectomy. 2.  Continue full liquid diet with small, frequent aliquots of food. 3.  [facility] will revisit Monday; if having ongoing issues, could consider endoscopy (especially to assess the possible soft tissue fullness seen on esophagram, although not sure whether patient would be candidate for any type of therapy even should a tumor be identified), could consider feeding tube (could not be done endoscopically given her moderate-sized hiatal hernia), could consider supportive care without any further invasive interventions.  Landry Dyke 11/05/2018, 11:08 AM   Cell [phone] If no answer or after 5 PM call [phone]

## 2018-11-05 NOTE — Progress Notes (Signed)
Patient's daughter, [name] updated regarding patient's condition. Transferred into the room for her to speak with mom.

## 2018-11-05 NOTE — Progress Notes (Signed)
PROGRESS NOTE  [name]:[id number] DOB: 1926-03-30 DOA: 11/01/2018 PCP: Mayra Neer, MD  Brief History   The patient is a 85 yr old woman who presented to the ED on 10/30/2018 with a complaint of mild diffuse abdominal pain. CT abdomen and [place] were negative for cholecystitis. Pt and her daughter preferred to be discharged to home on that occasion, but once they got home, the patient was unable to keep PO down. She returned to the ED on 11/01/2018 with complaints of abdominal pain and nausea and vomiting since 10/29/2018.  The patient has a past medical history significant for dementia, hypertension, atrial fibrillation, breast cancer in remission, chronic back pain, and arthritis. She has recently had an admission for encephalopathy and rhabdomyolysis.   Right upper quadrant ultrasound was negative for ductal dilatation or cholecystitis, but did demonstrated cholelithiasis. HIDA Scan was normal. Patient continues to complain of chest pain, and her WBC is 16.3. Will repeat CXR.  I have consulted gastroenterology: UGI demosntrates a hiatal hernia. GI has stated that perhaps this is intermittently being torsed. He feels that surgery to correct this and remove her gallbladder would be inadvisable due to the patient's advanced age and multiple comorbidities. He has recommended a trial of a full liquid diet and consideration of a feeding tube in the future should she continue to have regurgitation with meals.  GI plans to re-evaluate the patient on Monday. If she is not improved, he will consider possible EGD to better evaluate soft tissue prominence.  Consultants  . Gastroenterology  Procedures  . None  Antibiotics   Anti-infectives (From admission, onward)   None     .   Subjective  The patient is sitting up in bed. She is resting comfortably. She states that she feels better today. She did vomit jello last night.  Objective   Vitals:  Vitals:   11/04/18 2058 11/05/18 0523   BP: (!) 144/54 (!) 155/41  Pulse: (!) 103 (!) 56  Resp: 18 18  Temp: 98.7 F (37.1 C) 98 F (36.7 C)  SpO2: 96% 95%   Exam:  Constitutional:  The patient is awake and alert. No acute distress. Respiratory:  . No increased work of breathing. . No wheezes, rales, or rhonchi. . No tactile fremitus. Cardiovascular:  . Rapid rate and irregular rhythm. . No murmurs, ectopy,or gallups. . No lateral PMI. No thrills. Abdomen:  . Abdomen is tender in the epigastrum. It is mildly distended.  . No hernias, masses, or organomegaly. . Hypoactive bowel sound.s Musculoskeletal:  . No cyanosis, clubbing, or edema Skin:  . No rashes, lesions, ulcers . palpation of skin: no induration or nodules Neurologic:  . CN 2-12 intact . Sensation all 4 extremities intact Psychiatric:  . Mental status o Mood, affect appropriate . judgment and insight are not intact.  I have personally reviewed the following:   Today's Data  . CBC, CMP, Vitals  Cardiology Data  . EKG - atrial fibrillation with RVR.  Scheduled Meds: . atorvastatin  20 mg Oral Daily  . diltiazem  180 mg Oral Daily  . gabapentin  100 mg Oral QHS  . pantoprazole (PROTONIX) IV  40 mg Intravenous Q24H  . sertraline  50 mg Oral q morning - 10a  . sodium chloride flush  10-40 mL Intracatheter Q12H   Continuous Infusions: . sodium chloride 100 mL/hr at 11/05/18 1884    Principal Problem:   Cholelithiasis Active Problems:   Personal history of malignant neoplasm of breast  Essential hypertension   Atrial fibrillation, chronic   Abdominal pain   Nausea and vomiting   Dehydration   Dyslipidemia   Chronic back pain   Acute cholecystitis   LOS: 4 days    A & P   Atrial Fibrillation with RVR: Patient was initially started on IV diltiazem as she was unable to keep her PO medications down. Her rate was brought under control, and her drip was discontinued. She is now on the floor and her oral diltiazem has been restarted.  This morning her heart rate remained high after she took her daily diltiazem. Will give an additional dose of 30 mg of diltiazem.  Cholecystitis:  HIDA scan is negative for biliary dyskinesis.Right upper quadrant ultrasound was unremarkable except for the presence of stones. No CBD dilatation. I will order a CXR and repeat CT abdomen and pelvis. Very little evidence for a bilary cause of the patient's pain. Surgery would be a poor option for this patient.  Abdominal pain: Seems intermittent. Possibly due to intermittent episodes of torsion as suggested by Dr. Cristina Gong.Surgery to reduce her hernia would introduce a high level of risk to this patient. If the patient is not improved when re-evaluated by GI on Monday, consideration will be given to EGD to further investigate and take biopsies.  Nausea and vomiting: Vomited last night, but has tolerated full liquids this am. Appears resolved. Antiemetics and IV fluids ordered.  Dehydration: Continue IV fluids. Monitor volume status.  Dyslipidemia: Statin is on hold.  Chronic back pain: Continue home pain meds as possible.  I have seen and examined this patient myself. I have spent 32 minutes in her evaluation and care,.  DVT prophylaxis: Eliquis Code Status: Full Code Family Communication: I spoke with both of the patient's daughters today, Guadelupe Sabin (POA), and [name] Disposition Plan: tbd   Karie Kirks, DO [facility] Direct contact: see [email or web address]  7PM-7AM contact night coverage as above 11/05/2018, 2:55 PM  LOS: 1 day

## 2018-11-05 NOTE — Progress Notes (Signed)
Pharmacy IV to PO conversion  The patient is receiving pantoprazole by the intravenous route.  Based on criteria approved by the Pharmacy and [place], the medication is being converted to the equivalent oral dose form.   No active GI bleeding or impaired absorption  Not s/p esophagectomy  Documented ability to take oral medications for > 24 hr  Plan to continue treatment for at least 1 day  If you have any questions about this conversion, please contact the Pharmacy Department (ext [phone]).  Thank you.  Reuel Boom, PharmD, BCPS [phone] 11/05/2018, 3:44 PM

## 2018-11-05 NOTE — Progress Notes (Signed)
Patient able to tolerate mostly juice/water today. C/O pain with eating applesauce. Did spit up a small amount of apple sauce in the AM.

## 2018-11-06 LAB — COMPREHENSIVE METABOLIC PANEL
ALT: 20 U/L (ref 0–44)
AST: 14 U/L — ABNORMAL LOW (ref 15–41)
Albumin: 2.6 g/dL — ABNORMAL LOW (ref 3.5–5.0)
Alkaline Phosphatase: 54 U/L (ref 38–126)
Anion gap: 9 (ref 5–15)
BUN: 6 mg/dL — ABNORMAL LOW (ref 8–23)
CO2: 22 mmol/L (ref 22–32)
Calcium: 7.8 mg/dL — ABNORMAL LOW (ref 8.9–10.3)
Chloride: 108 mmol/L (ref 98–111)
Creatinine, Ser: 0.55 mg/dL (ref 0.44–1.00)
GFR calc Af Amer: >60 mL/min (ref >60)
GFR calc non Af Amer: >60 mL/min (ref >60)
Glucose, Bld: 144 mg/dL — ABNORMAL HIGH (ref 70–99)
Potassium: 2.9 mmol/L — ABNORMAL LOW (ref 3.5–5.1)
Sodium: 139 mmol/L (ref 135–145)
Total Bilirubin: 0.5 mg/dL (ref 0.3–1.2)
Total Protein: 5.5 g/dL — ABNORMAL LOW (ref 6.5–8.1)

## 2018-11-06 LAB — CBC WITH DIFFERENTIAL/PLATELET
Abs Immature Granulocytes: 0.04 10*3/uL (ref 0.00–0.07)
Basophils Absolute: 0 10*3/uL (ref 0.0–0.1)
Basophils Relative: 0 %
Eosinophils Absolute: 0.2 10*3/uL (ref 0.0–0.5)
Eosinophils Relative: 2 %
HCT: 35.1 % — ABNORMAL LOW (ref 36.0–46.0)
Hemoglobin: 11.5 g/dL — ABNORMAL LOW (ref 12.0–15.0)
Immature Granulocytes: 0 %
Lymphocytes Relative: 16 %
Lymphs Abs: 1.6 10*3/uL (ref 0.7–4.0)
MCH: 31.7 pg (ref 26.0–34.0)
MCHC: 32.8 g/dL (ref 30.0–36.0)
MCV: 96.7 fL (ref 80.0–100.0)
Monocytes Absolute: 1.1 10*3/uL — ABNORMAL HIGH (ref 0.1–1.0)
Monocytes Relative: 11 %
Neutro Abs: 7 10*3/uL (ref 1.7–7.7)
Neutrophils Relative %: 71 %
Platelets: 294 10*3/uL (ref 150–400)
RBC: 3.63 MIL/uL — ABNORMAL LOW (ref 3.87–5.11)
RDW: 12.7 % (ref 11.5–15.5)
WBC: 10 10*3/uL (ref 4.0–10.5)
nRBC: 0 % (ref 0.0–0.2)

## 2018-11-06 MED ORDER — POTASSIUM CHLORIDE 10 MEQ/100ML IV SOLN
10.0000 meq | INTRAVENOUS | Status: AC
  Administered 2018-11-06 (×4): 10 meq via INTRAVENOUS
  Filled 2018-11-06 (×4): qty 100

## 2018-11-06 NOTE — Progress Notes (Signed)
PROGRESS NOTE  [name] LYY:[id number] DOB: [birth year] DOA: 11/01/2018 PCP: Mayra Neer, MD  Brief History   The patient is a 85 yr old woman who presented to the ED on 10/30/2018 with a complaint of mild diffuse abdominal pain. CT abdomen and [place] were negative for cholecystitis. Pt and her daughter preferred to be discharged to home on that occasion, but once they got home, the patient was unable to keep PO down. She returned to the ED on 11/01/2018 with complaints of abdominal pain and nausea and vomiting since 10/29/2018.  The patient has a past medical history significant for dementia, hypertension, atrial fibrillation, breast cancer in remission, chronic back pain, and arthritis. She has recently had an admission for encephalopathy and rhabdomyolysis.   Right upper quadrant ultrasound was negative for ductal dilatation or cholecystitis, but did demonstrated cholelithiasis. HIDA Scan was normal. Patient continues to complain of chest pain, and her WBC is 16.3. Will repeat CXR.  I have consulted gastroenterology: UGI demosntrates a hiatal hernia. GI has stated that perhaps this is intermittently being torsed. He feels that surgery to correct this and remove her gallbladder would be inadvisable due to the patient's advanced age and multiple comorbidities. He has recommended a trial of a full liquid diet and consideration of a feeding tube in the future should she continue to have regurgitation with meals.  GI plans to re-evaluate the patient on Monday. If she is not improved, he will consider possible EGD to better evaluate soft tissue prominence.  Consultants  . Gastroenterology  Procedures  . None  Antibiotics   Anti-infectives (From admission, onward)   None      Subjective  The patient is sitting up in bed. She tolerating full liquids, but is not taking in much PO. She continues to complain of abdominal pain.  Objective   Vitals:  Vitals:   11/05/18 2030 11/06/18 0441   BP: 110/88 106/66  Pulse: 66 (!) 103  Resp: 18 16  Temp: 98.3 F (36.8 C) 98.5 F (36.9 C)  SpO2: 97% 96%   Exam:  Constitutional:  The patient is awake and alert. No acute distress. Respiratory:  . No increased work of breathing. . No wheezes, rales, or rhonchi. . No tactile fremitus. Cardiovascular:  . Rapid rate and irregular rhythm. . No murmurs, ectopy,or gallups. . No lateral PMI. No thrills. Abdomen:  . Abdomen is tender in the epigastrum. It is mildly distended.  . No hernias, masses, or organomegaly. . Hypoactive bowel sound.s Musculoskeletal:  . No cyanosis, clubbing, or edema Skin:  . No rashes, lesions, ulcers . palpation of skin: no induration or nodules Neurologic:  . CN 2-12 intact . Sensation all 4 extremities intact Psychiatric:  . Mental status o Mood, affect appropriate . judgment and insight are not intact.  I have personally reviewed the following:   Today's Data  . CBC, CMP, Vitals  Cardiology Data  . EKG - atrial fibrillation with RVR.  Scheduled Meds: . atorvastatin  20 mg Oral Daily  . diltiazem  180 mg Oral Daily  . gabapentin  100 mg Oral QHS  . pantoprazole  40 mg Oral Daily  . sertraline  50 mg Oral q morning - 10a  . sodium chloride flush  10-40 mL Intracatheter Q12H   Continuous Infusions: . sodium chloride 100 mL/hr at 11/06/18 1126  . potassium chloride 10 mEq (11/06/18 1129)    Principal Problem:   Cholelithiasis Active Problems:   Personal history of malignant neoplasm of  breast   Essential hypertension   Atrial fibrillation, chronic   Abdominal pain   Nausea and vomiting   Dehydration   Dyslipidemia   Chronic back pain   Acute cholecystitis   LOS: 5 days    A & P   Atrial Fibrillation with RVR: Patient was initially started on IV diltiazem as she was unable to keep her PO medications down. Her rate was brought under control, and her drip was discontinued. She is now on the floor and her oral diltiazem has  been restarted. This morning her heart rate remained high after she took her daily diltiazem. Will give an additional dose of 30 mg of diltiazem.  Cholecystitis:  HIDA scan is negative for biliary dyskinesis.Right upper quadrant ultrasound was unremarkable except for the presence of stones. No CBD dilatation. I will order a CXR and repeat CT abdomen and pelvis. Very little evidence for a bilary cause of the patient's pain. Surgery would be a poor option for this patient.  Abdominal pain: Seems intermittent. Possibly due to intermittent episodes of torsion as suggested by Dr. Cristina Gong.Surgery to reduce her hernia would introduce a high level of risk to this patient. If the patient is not improved when re-evaluated by GI on Monday, consideration will be given to EGD to further investigate and take biopsies.  Nausea and vomiting: Vomited last night, but has tolerated full liquids this am. Appears resolved. Antiemetics and IV fluids ordered.  Dehydration: Continue IV fluids. Monitor volume status.  Dyslipidemia: Statin is on hold.  Chronic back pain: Continue home pain meds as possible.  I have seen and examined this patient myself. I have spent 32 minutes in her evaluation and care,.  DVT prophylaxis: Eliquis Code Status: Full Code Family Communication: I spoke with both of the patient's daughters today, Guadelupe Sabin (POA), and [name] Disposition Plan: tbd   Karie Kirks, DO [facility] Direct contact: see [email or web address]  7PM-7AM contact night coverage as above 11/06/2018, 12:32 PM  LOS: 1 day

## 2018-11-07 NOTE — Care Management Important Message (Signed)
Important Message  Patient Details IM Letter given to Sharren Bridge SW to present to the Patient Name: [name] MRN: [id number] Date of Birth: [birth year]   Medicare Important Message Given:  Yes     Kerin Salen 11/07/2018, 11:30 AM

## 2018-11-07 NOTE — Progress Notes (Signed)
Patient continued with confusion overnight. She pulled out her midline. Patient now has a peripheral IV. Will continue to monitor patient.

## 2018-11-07 NOTE — Progress Notes (Signed)
PROGRESS NOTE  [name] OFB:[id number] DOB: 09/27/1925 DOA: 11/01/2018 PCP: Mayra Neer, MD  Brief History   The patient is a 85 yr old woman who presented to the ED on 10/30/2018 with a complaint of mild diffuse abdominal pain. CT abdomen and [place] were negative for cholecystitis. Pt and her daughter preferred to be discharged to home on that occasion, but once they got home, the patient was unable to keep PO down. She returned to the ED on 11/01/2018 with complaints of abdominal pain and nausea and vomiting since 10/29/2018.  The patient has a past medical history significant for dementia, hypertension, atrial fibrillation, breast cancer in remission, chronic back pain, and arthritis. She has recently had an admission for encephalopathy and rhabdomyolysis.   Right upper quadrant ultrasound was negative for ductal dilatation or cholecystitis, but did demonstrated cholelithiasis. HIDA Scan was normal. Patient continues to complain of chest pain, and her WBC is 16.3. Will repeat CXR.  I have consulted gastroenterology: UGI demosntrates a hiatal hernia. GI has stated that perhaps this is intermittently being torsed. He feels that surgery to correct this and remove her gallbladder would be inadvisable due to the patient's advanced age and multiple comorbidities. He has recommended a trial of a full liquid diet and consideration of a feeding tube in the future should she continue to have regurgitation with meals.  GI plans to re-evaluate the patient on Monday. If she is not improved, he will consider possible EGD to better evaluate soft tissue prominence.  Consultants  . Gastroenterology  Procedures  . None  Antibiotics   Anti-infectives (From admission, onward)   None      Subjective  The patient is sitting up in bed. She tolerating full liquids, but not taking in much PO.  Objective   Vitals:  Vitals:   11/07/18 0525 11/07/18 1337  BP: (!) 167/64 (!) 178/71  Pulse: 76 (!) 55   Resp: 16 16  Temp: 98.6 F (37 C) 97.9 F (36.6 C)  SpO2: 93% 97%   Exam:  Constitutional:  The patient is awake and alert. No acute distress. Respiratory:  . No increased work of breathing. . No wheezes, rales, or rhonchi. . No tactile fremitus. Cardiovascular:  . Rapid rate and irregular rhythm. . No murmurs, ectopy,or gallups. . No lateral PMI. No thrills. Abdomen:  . Abdomen is tender in the epigastrum. It is mildly distended.  . No hernias, masses, or organomegaly. . Hypoactive bowel sound.s Musculoskeletal:  . No cyanosis, clubbing, or edema Skin:  . No rashes, lesions, ulcers . palpation of skin: no induration or nodules Neurologic:  . CN 2-12 intact . Sensation all 4 extremities intact Psychiatric:  . Mental status o Mood, affect appropriate . judgment and insight are not intact.  I have personally reviewed the following:   Today's Data  . CBC, CMP, Vitals  Cardiology Data  . EKG - atrial fibrillation with RVR.  Scheduled Meds: . atorvastatin  20 mg Oral Daily  . diltiazem  180 mg Oral Daily  . gabapentin  100 mg Oral QHS  . pantoprazole  40 mg Oral Daily  . sertraline  50 mg Oral q morning - 10a  . sodium chloride flush  10-40 mL Intracatheter Q12H   Continuous Infusions: . sodium chloride 100 mL/hr at 11/07/18 7824    Principal Problem:   Cholelithiasis Active Problems:   Personal history of malignant neoplasm of breast   Essential hypertension   Atrial fibrillation, chronic   Abdominal pain  Nausea and vomiting   Dehydration   Dyslipidemia   Chronic back pain   Acute cholecystitis   LOS: 6 days    A & P   Atrial Fibrillation with RVR: Patient was initially started on IV diltiazem as she was unable to keep her PO medications down. Her rate was brought under control, and her drip was discontinued. She is now on the floor and her oral diltiazem has been restarted. This morning her heart rate remained high after she took her daily  diltiazem. Will give an additional dose of 30 mg of diltiazem.  Cholecystitis:  HIDA scan is negative for biliary dyskinesis.Right upper quadrant ultrasound was unremarkable except for the presence of stones. No CBD dilatation. I will order a CXR and repeat CT abdomen and pelvis. Very little evidence for a bilary cause of the patient's pain. Surgery would be a poor option for this patient.  Abdominal pain: Seems intermittent. Possibly due to intermittent episodes of torsion as suggested by Dr. Cristina Gong.Surgery to reduce her hernia would introduce a high level of risk to this patient. Dr. Cristina Gong has re-evaluated the patient and discussed her with family. The patient's family is not in favor of procedures that would place the patient at risk, or cause her discomfort without providing a solution to her difficulties with eating. GI has signed off. Will continue to attempt to advance diet as possible and as limited by her nausea and vomiting and pain.  Nausea and vomiting:  Tolerating full liquid diet well.  Dehydration: Will stop IV fluids. Monitor volume status.  Dyslipidemia: Statin is on hold.  Chronic back pain: Continue home pain meds as possible.  I have seen and examined this patient myself. I have spent 34 minutes in her evaluation and care,.  DVT prophylaxis: Eliquis Code Status: Full Code Family Communication: None available. Disposition Plan: tbd   Ani Deoliveira, DO [facility] Direct contact: see [email or web address]  7PM-7AM contact night coverage as above 11/07/2018, 1:58 PM  LOS: 1 day

## 2018-11-07 NOTE — Progress Notes (Signed)
Spoke w/ pt's dtr, Barbera Setters, by telephone.  She indicated, having talked w/ her sister, that they are not in favor of her mother undergoing procedures or tests that are unlikely to help her.  Moreover, they do not want a feeding tube.   I explained that I think doing an egd for evaluation of the possible soft tissue mass seen radiographically would be low-yield, moderately high risk, and almost certainly would not disclose a condition that could be "fixed" in view of her mother's overall condition.  At the same time, I am somewhat encouraged by the fact that the patient had reasonable PO intake yesterday, even though most likely her intake will be a "good days and bad days" phenomenon.  Putting all this together, it appears that the present plan is to see how the patient continues to do with attempts at oral nutrition.  If patient does well with full liquids over the next day or so, would consider advancing to a soft diet.  I will sign off, but don't hesitate to call us if further input from [place] is desired.   Cleotis Nipper, M.D. Pager [phone] If no answer or after 5 PM call [phone]

## 2018-11-07 NOTE — Progress Notes (Signed)
Patient in room sitting on bedside commode, pleasant, no evident distress, remembers her daughter's name.  Per conversation with the nurse, who also had the patient yesterday, the patient is eating roughly 40% of her meals.  No recent vomiting.  Impression: Intermittent food intolerance, etiology unclear, possibly related to her known large hiatal hernia.  Plan: I will try to talk again with the patient's daughter, [name], to discern goals of care.  Cleotis Nipper, M.D. Pager [phone] If no answer or after 5 PM call [phone]

## 2018-11-08 ENCOUNTER — Other Ambulatory Visit: Payer: Self-pay | Admitting: Cardiovascular Disease

## 2018-11-08 DIAGNOSIS — I10 Essential (primary) hypertension: Secondary | ICD-10-CM

## 2018-11-08 DIAGNOSIS — Z7901 Long term (current) use of anticoagulants: Secondary | ICD-10-CM

## 2018-11-08 DIAGNOSIS — Z853 Personal history of malignant neoplasm of breast: Secondary | ICD-10-CM

## 2018-11-08 DIAGNOSIS — Z7189 Other specified counseling: Secondary | ICD-10-CM

## 2018-11-08 DIAGNOSIS — R1013 Epigastric pain: Secondary | ICD-10-CM

## 2018-11-08 DIAGNOSIS — I4891 Unspecified atrial fibrillation: Secondary | ICD-10-CM

## 2018-11-08 DIAGNOSIS — Z515 Encounter for palliative care: Secondary | ICD-10-CM

## 2018-11-08 LAB — BASIC METABOLIC PANEL
Anion gap: 6 (ref 5–15)
BUN: <5 mg/dL — ABNORMAL LOW (ref 8–23)
CO2: 24 mmol/L (ref 22–32)
Calcium: 8.2 mg/dL — ABNORMAL LOW (ref 8.9–10.3)
Chloride: 107 mmol/L (ref 98–111)
Creatinine, Ser: 0.53 mg/dL (ref 0.44–1.00)
GFR calc Af Amer: >60 mL/min (ref >60)
GFR calc non Af Amer: >60 mL/min (ref >60)
Glucose, Bld: 137 mg/dL — ABNORMAL HIGH (ref 70–99)
Potassium: 2.9 mmol/L — ABNORMAL LOW (ref 3.5–5.1)
Sodium: 137 mmol/L (ref 135–145)

## 2018-11-08 LAB — CULTURE, BLOOD (ROUTINE X 2)
Culture: NO GROWTH
Culture: NO GROWTH

## 2018-11-08 LAB — POTASSIUM: Potassium: 3.1 mmol/L — ABNORMAL LOW (ref 3.5–5.1)

## 2018-11-08 MED ORDER — METOPROLOL TARTRATE 5 MG/5ML IV SOLN
5.0000 mg | Freq: Once | INTRAVENOUS | Status: AC
  Administered 2018-11-08: 5 mg via INTRAVENOUS
  Filled 2018-11-08: qty 5

## 2018-11-08 MED ORDER — DILTIAZEM HCL ER COATED BEADS 240 MG PO CP24
240.0000 mg | ORAL_CAPSULE | Freq: Every day | ORAL | Status: DC
  Administered 2018-11-08 – 2018-11-10 (×3): 240 mg via ORAL
  Filled 2018-11-08 (×3): qty 1

## 2018-11-08 MED ORDER — POTASSIUM CHLORIDE 10 MEQ/100ML IV SOLN
INTRAVENOUS | Status: AC
  Filled 2018-11-08: qty 100

## 2018-11-08 MED ORDER — POTASSIUM CHLORIDE 10 MEQ/100ML IV SOLN
10.0000 meq | INTRAVENOUS | Status: AC
  Administered 2018-11-08 (×4): 10 meq via INTRAVENOUS
  Filled 2018-11-08 (×3): qty 100

## 2018-11-08 NOTE — Evaluation (Signed)
Occupational Therapy Evaluation Patient Details Name: [name] MRN: [id number] DOB: 15-Aug-1925 Today's Date: 11/08/2018    History of Present Illness 85 yo female admitted with cholelithiasis, A fib with RVR, abd pain. Hx of dementia, A fib, breast cancer, OA, chronic back.   Clinical Impression   Pt admitted with cholelithisis. Pt currently with functional limitations due to the deficits listed below (see OT Problem List).  Pt will benefit from skilled OT to increase their safety and independence with ADL and functional mobility for ADL to facilitate discharge to venue listed below.      Follow Up Recommendations  Home health OT;Supervision/Assistance - 24 hour;SNF(depending on care at home)    Equipment Recommendations  None recommended by OT    Recommendations for Other Services       Precautions / Restrictions Precautions Precautions: Fall      Mobility Bed Mobility Overal bed mobility: Needs Assistance Bed Mobility: Supine to Sit     Supine to sit: [facility] elevated;Min assist        Transfers Overall transfer level: Needs assistance Equipment used: Rolling walker (2 wheeled) Transfers: Sit to/from Stand Sit to Stand: Min assist         General transfer comment: Assist to rise, stabilize, control descent. VCs safety, technique, hand placement. Posterior bias.    Balance Overall balance assessment: Needs assistance         Standing balance support: Bilateral upper extremity supported Standing balance-Leahy Scale: Poor                             ADL either performed or assessed with clinical judgement   ADL Overall ADL's : Needs assistance/impaired Eating/Feeding: Set up;Sitting   Grooming: Set up;Supervision/safety;Standing   Upper Body Bathing: Set up;Sitting   Lower Body Bathing: Min guard;Sitting/lateral leans;Sit to/from stand   Upper Body Dressing : Supervision/safety;Set up;Sitting   Lower Body Dressing: Min  guard;Sitting/lateral leans;Sit to/from stand   Toilet Transfer: Electronics engineer and Hygiene: Minimal assistance;Sitting/lateral lean;Sit to/from stand       Functional mobility during ADLs: Rolling walker;Cueing for safety;Minimal assistance       Vision Patient Visual Report: No change from baseline              Pertinent Vitals/Pain Pain Assessment: No/denies pain        Extremity/Trunk Assessment         Cervical / Trunk Assessment Cervical / Trunk Assessment: Kyphotic   Communication Communication Communication: HOH   Cognition Arousal/Alertness: Awake/alert Behavior During Therapy: WFL for tasks assessed/performed Overall Cognitive Status: Within Functional Limits for tasks assessed                                                Home Living Family/patient expects to be discharged to:: Private residence Living Arrangements: Alone Available Help at Discharge: Available PRN/intermittently Type of Home: Independent living facility Home Access: Level entry     Home Layout: One level               Home Equipment: Walker - 4 wheels          Prior Functioning/Environment Level of Independence: Independent                 OT Problem List:  Decreased strength;Decreased activity tolerance;Impaired balance (sitting and/or standing);Decreased safety awareness;Decreased cognition;Pain      OT Treatment/Interventions: Self-care/ADL training;Therapeutic exercise;Neuromuscular education;Energy conservation;Therapeutic activities;Balance training;Patient/family education    OT Goals(Current goals can be found in the care plan section) Acute Rehab OT Goals Patient Stated Goal: feel better OT Goal Formulation: With patient Time For Goal Achievement: 11/15/18 Potential to Achieve Goals: Good ADL Goals Pt Will Perform Grooming: with supervision;standing Pt Will Perform Lower Body  Dressing: with supervision;sit to/from stand Pt Will Transfer to Toilet: with supervision;ambulating Pt Will Perform Toileting - Clothing Manipulation and hygiene: with supervision;sit to/from stand  OT Frequency: Min 2X/week   Barriers to D/C: Decreased caregiver support          Co-evaluation              AM-PAC OT "6 Clicks" Daily Activity     Outcome Measure Help from another person eating meals?: None Help from another person taking care of personal grooming?: A Little Help from another person toileting, which includes using toliet, bedpan, or urinal?: A Little Help from another person bathing (including washing, rinsing, drying)?: A Little Help from another person to put on and taking off regular upper body clothing?: A Little Help from another person to put on and taking off regular lower body clothing?: A Little 6 Click Score: 19   End of Session Equipment Utilized During Treatment: Gait belt;Rolling walker Nurse Communication: Mobility status  Activity Tolerance: Patient tolerated treatment well Patient left: in chair;with call bell/phone within reach;with chair alarm set  OT Visit Diagnosis: Unsteadiness on feet (R26.81);Muscle weakness (generalized) (M62.81)                Time: 9030-0923 OT Time Calculation (min): 18 min Charges:  OT General Charges $OT Visit: 1 Visit OT Evaluation $OT Eval Moderate Complexity: 1 Mod  Kari Baars, [place] [phone] Office- [phone], Edwena Felty D 11/08/2018, 4:45 PM

## 2018-11-08 NOTE — Consult Note (Signed)
Consultation Note Date: 11/08/2018   Patient Name: [name]  DOB: [birth year]  MRN: [id number]  Age / Sex: 85 y.o., female  PCP: Makayla Neer, MD Referring Physician: Karie Kirks, DO  Reason for Consultation: Establishing goals of care  HPI/Patient Profile: 85 y.o. female  with past medical history of dementia, HTN, a fib, breast cancer, chronic back pain, and arthritis admitted on 11/01/2018 with abdominal pain and N/V.  RUQ US revealed cholelithiasis - no ductal dilatation or cholecystitis. HIDA scan normal. UGI revealed hiatal hernia concern of intermittent torsion. Surgery not recommended with GI. Per GI conversation with daughter - no procedures or tests, no feeding tube. Patient has had improvement of PO intake. PMT consulted for [place].   Clinical Assessment and Goals of Care: I have reviewed medical records including EPIC notes, labs and imaging, received report from RN - patient worked with therapy, now sitting up feeding herself, pleasantly confused.   I then spoke with patient's daughter, [name],  to discuss diagnosis prognosis, [place], EOL wishes, disposition and options.  She tells me she and her sister, [name], are patient's decision makers - they have discussed situation and their goals of care are aligned.   I introduced Palliative Medicine as specialized medical care for people living with serious illness. It focuses on providing relief from the symptoms and stress of a serious illness. The goal is to improve quality of life for both the patient and the family.  As far as functional and nutritional status, [name] tells me about patient's decline d/t her cognitive function - easily confused and forgetful. She does live alone at an ILF and has therapy that comes a few times a week. She is independent in ADLs. Was eating well prior to current problems. She had a fall prior to admission.     We discussed her current illness and what  it means in the larger context of her on-going co-morbidities.  Natural disease trajectory and expectations at EOL were discussed. [name] has a good understanding of illness and situation - she is grateful for conversations with Dr. Cristina Gay.   I attempted to elicit values and goals of care important to the patient.  [name] tells me that their goals are that the patient not suffer - they do not want to prolong her life artificially.   The difference between aggressive medical intervention and comfort care was considered in light of the patient's goals of care. They are not interested in any aggressive interventions. They would like to see how the patient does with current plan of care - is she able to keep PO intake down and take in enough to sustain herself. If so, they hope to bring her home and continue therapy, continue palliative care. If she does decline and is not able to take in enough PO to sustain herself they would be interested in hospice services.   Advance directives, concepts specific to code status, artifical feeding and hydration, and rehospitalization were considered and discussed. We discussed that [name] is currently listed as full code - discussed this does not align with wishes - [name] agrees to DNR.   Hospice and Palliative Care services outpatient were explained and offered. Unless patient unable to maintain PO intake, patient to return to ILF with palliative care - palliative care can assist with transition to hospice as appropriate.   Daughter has arranged for additional caregivers at [facility] and hopes patient can return to former living environment with the extra help.   We discussed  patient's symptoms - has not required antiemetics since Saturday and is taking one Norco at night - symptoms controlled per RN.   Questions and concerns were addressed. The family was encouraged to call with questions or concerns.    Primary Decision Maker NEXT OF KIN - daughters [name] and  [name]    SUMMARY OF RECOMMENDATIONS   - family not interested in aggressive interventions -hopeful she can maintain PO intake and return to independent living facility with additional caregivers that daughter has arranged - outpatient palliative to follow up - confirmed with [facility] liaison - introduced hospice if unable to maintain intake or when declines outpatient palliative can help transition to hospice - code status changed to DNR per discussion with daughter - signed DNR on chart - continue current symptom management with norco and zofran - symptoms well controlled per RN - no feeding tube  Code Status/Advance Care Planning:  DNR  Palliative Prophylaxis:   Delirium Protocol and Frequent Pain Assessment  Additional Recommendations (Limitations, Scope, Preferences):  Initiate Comfort Feeding and No Artificial Feeding  Psycho-social/Spiritual:   Desire for further Chaplaincy support:no  Additional Recommendations: Education on Hospice  Prognosis:   Unable to determine  Discharge Planning: ILF with palliative      Primary Diagnoses: Present on Admission: . Cholelithiasis . Essential hypertension . Atrial fibrillation, chronic . Acute cholecystitis   I have reviewed the medical record, interviewed the patient and family, and examined the patient. The following aspects are pertinent.  Past Medical History:  Diagnosis Date  . Arthritis    low spine  . Breast cancer [facility]) 2013   right breast  . Breast cancer, right breast, recurrent 06/03/2011  . Cancer [facility]) 1989   breast right  . Chronic back pain   . Hypertension    Social History   Socioeconomic History  . Marital status: Married    Spouse name: Not on file  . Number of children: Not on file  . Years of education: Not on file  . Highest education level: Not on file  Occupational History  . Not on file  Social Needs  . Financial resource strain: Not on file  . Food insecurity    Worry:  Not on file    Inability: Not on file  . Transportation needs    Medical: Not on file    Non-medical: Not on file  Tobacco Use  . Smoking status: Former Smoker    Quit date: 05/11/1970    Years since quitting: 48.5  . Smokeless tobacco: Never Used  Substance and Sexual Activity  . Alcohol use: No  . Drug use: No  . Sexual activity: Not on file  Lifestyle  . Physical activity    Days per week: Not on file    Minutes per session: Not on file  . Stress: Not on file  Relationships  . Social Herbalist on phone: Not on file    Gets together: Not on file    Attends religious service: Not on file    Active member of club or organization: Not on file    Attends meetings of clubs or organizations: Not on file    Relationship status: Not on file  Other Topics Concern  . Not on file  Social History Narrative  . Not on file   Family History  Problem Relation Age of Onset  . Heart disease Mother   . Breast cancer Maternal Aunt   . Cancer Brother  unaware  . Heart disease Brother    Scheduled Meds: . atorvastatin  20 mg Oral Daily  . [START ON 11/09/2018] diltiazem  240 mg Oral Daily  . gabapentin  100 mg Oral QHS  . pantoprazole  40 mg Oral Daily  . sertraline  50 mg Oral q morning - 10a  . sodium chloride flush  10-40 mL Intracatheter Q12H   Continuous Infusions: . sodium chloride 100 mL/hr at 11/08/18 1312  . potassium chloride 10 mEq (11/08/18 1223)   PRN Meds:.HYDROcodone-acetaminophen, iohexol, ondansetron (ZOFRAN) IV, sodium chloride flush Allergies  Allergen Reactions  . Arimidex [Anastrozole] Other (See Comments)    Neck cramping  . Fish Allergy Other (See Comments)    Patient prefers to NOT eat this  . Morphine And Related Nausea And Vomiting  . Shellfish Allergy Other (See Comments)    Patient prefers to NOT eat this  . Vicodin [Hydrocodone-Acetaminophen] Nausea Only    DOES tolerate Vicodin/Norco, however    Vital Signs: BP (!) 160/69 (BP  Location: Left Leg)   Pulse (!) 115   Temp 98.8 F (37.1 C) (Oral)   Resp 19   Ht 5\' 5"  (1.651 m)   Wt 79.4 kg   SpO2 96%   BMI 29.12 kg/m  Pain Scale: PAINAD POSS *See Group Information*: S-Acceptable,Sleep, easy to arouse Pain Score: Asleep   SpO2: SpO2: 96 % O2 Device:SpO2: 96 % O2 Flow Rate: .   IO: Intake/output summary:   Intake/Output Summary (Last 24 hours) at 11/08/2018 1414 Last data filed at 11/08/2018 1012 Gross per 24 hour  Intake 2427.33 ml  Output 2725 ml  Net -297.67 ml    LBM: Last BM Date: 11/07/18 Baseline Weight: Weight: 79.4 kg Most recent weight: Weight: 79.4 kg     Palliative Assessment/Data: PPS 20% (d/t PO intake, may improve, function is good)    The above conversation was completed via telephone due to the visitor restrictions during the COVID-19 pandemic. Thorough chart review and discussion with necessary members of the care team was completed as part of assessment. All issues were discussed and addressed but no physical exam was performed.  Time Total: 50 minutes Greater than 50%  of this time was spent counseling and coordinating care related to the above assessment and plan.  Juel Burrow, DNP, AGNP-C Palliative Medicine Team [phone] Pager: [phone]

## 2018-11-08 NOTE — Progress Notes (Signed)
Patient back in Afib this am with rate in the one teens to 120's. Pt asleep and without complaints. BP stable. NP on call was notified of heart rate. New order for Metoprolol 5mg  IV received and given to patient. Will continue to monitor patient.

## 2018-11-08 NOTE — Evaluation (Signed)
Physical Therapy Evaluation Patient Details Name: [name] MRN: [id number] DOB: 10/17/1925 Today's Date: 11/08/2018   History of Present Illness  85 yo female admitted with cholelithiasis, A fib with RVR, abd pain. Hx of dementia, A fib, breast cancer, OA, chronic back.  Clinical Impression  On eval, pt required Min -Mod assist for mobility. She waked ~30 feet with a RW. Pt presents with general weakness, decreased activity tolerance, and impaired gait and balance. Pt is at risk for falls when mobilizing. Per chart, pt's daughter would like pt to return to her ILF with caregivers. Unfortunately, pt lives alone. At this time, recommendation is for 24/7 supervision if pt is to return to her apt. If assist is not available, may need to consider SNF placement.     Follow Up Recommendations Home health PT;Supervision/Assistance - 24 hour    Equipment Recommendations  None recommended by PT    Recommendations for Other Services       Precautions / Restrictions Precautions Precautions: Fall Restrictions Weight Bearing Restrictions: No      Mobility  Bed Mobility Overal bed mobility: Needs Assistance Bed Mobility: Supine to Sit     Supine to sit: Mod assist;HOB elevated     General bed mobility comments: Assist for trunk and to scoot to EOB. Increased time. Cues required.  Transfers Overall transfer level: Needs assistance Equipment used: Rolling walker (2 wheeled) Transfers: Sit to/from Stand Sit to Stand: Min assist         General transfer comment: Assist to rise, stabilize, control descent. VCs safety, technique, hand placement. Posterior bias.  Ambulation/Gait   Gait Distance (Feet): 30 Feet Assistive device: Rolling walker (2 wheeled) Gait Pattern/deviations: Trunk flexed;Step-through pattern     General Gait Details: Assist to stabilize pt throughout distance. Cues for safety, proper use of RW, distance from RW. HR up 1o 135 bpm during ambulation so  limited distance.  Stairs            Wheelchair Mobility    Modified Rankin (Stroke Patients Only)       Balance Overall balance assessment: Needs assistance         Standing balance support: Bilateral upper extremity supported Standing balance-Leahy Scale: Poor                               Pertinent Vitals/Pain Pain Assessment: Faces Faces Pain Scale: No hurt    Home Living Family/patient expects to be discharged to:: Private residence     Type of Home: Independent living facility Home Access: Level entry     Home Layout: One level Home Equipment: Walker - 4 wheels      Prior Function                 Hand Dominance        Extremity/Trunk Assessment   Upper Extremity Assessment Upper Extremity Assessment: Generalized weakness    Lower Extremity Assessment Lower Extremity Assessment: Generalized weakness    Cervical / Trunk Assessment Cervical / Trunk Assessment: Kyphotic  Communication   Communication: HOH  Cognition Arousal/Alertness: Awake/alert Behavior During Therapy: WFL for tasks assessed/performed Overall Cognitive Status: History of cognitive impairments - at baseline Area of Impairment: Orientation;Following commands;Safety/judgement                 Orientation Level: Disoriented to;Place;Time;Situation   Memory: Decreased short-term memory Following Commands: Follows one step commands consistently Safety/Judgement: Decreased awareness of deficits;Decreased awareness  of safety            General Comments      Exercises     Assessment/Plan    PT Assessment Patient needs continued PT services  PT Problem List Decreased strength;Decreased mobility;Decreased activity tolerance;Decreased balance;Decreased cognition;Decreased knowledge of use of DME;Decreased safety awareness       PT Treatment Interventions DME instruction;Gait training;Therapeutic exercise;Therapeutic activities;Patient/family  education;Balance training;Functional mobility training    PT Goals (Current goals can be found in the Care Plan section)  Acute Rehab PT Goals Patient Stated Goal: none stated by pt PT Goal Formulation: With patient Time For Goal Achievement: 11/22/18 Potential to Achieve Goals: Fair    Frequency Min 3X/week   Barriers to discharge        Co-evaluation               AM-PAC PT "6 Clicks" Mobility  Outcome Measure Help needed turning from your back to your side while in a flat bed without using bedrails?: A Little Help needed moving from lying on your back to sitting on the side of a flat bed without using bedrails?: A Lot Help needed moving to and from a bed to a chair (including a wheelchair)?: A Little Help needed standing up from a chair using your arms (e.g., wheelchair or bedside chair)?: A Little Help needed to walk in hospital room?: A Little Help needed climbing 3-5 steps with a railing? : A Lot 6 Click Score: 16    End of Session Equipment Utilized During Treatment: Gait belt Activity Tolerance: Patient tolerated treatment well Patient left: in chair;with call bell/phone within reach;with chair alarm set   PT Visit Diagnosis: Muscle weakness (generalized) (M62.81);Difficulty in walking, not elsewhere classified (R26.2);Unsteadiness on feet (R26.81)    Time: 1610-9604 PT Time Calculation (min) (ACUTE ONLY): 24 min   Charges:   PT Evaluation $PT Eval Moderate Complexity: 1 Mod PT Treatments $Gait Training: 8-22 mins         Weston Anna, PT Acute Rehabilitation Services Pager: [phone] Office: [phone]

## 2018-11-08 NOTE — Plan of Care (Signed)

## 2018-11-09 LAB — CBC WITH DIFFERENTIAL/PLATELET
Abs Immature Granulocytes: 0.03 10*3/uL (ref 0.00–0.07)
Basophils Absolute: 0 10*3/uL (ref 0.0–0.1)
Basophils Relative: 0 %
Eosinophils Absolute: 0.2 10*3/uL (ref 0.0–0.5)
Eosinophils Relative: 2 %
HCT: 36.8 % (ref 36.0–46.0)
Hemoglobin: 11.8 g/dL — ABNORMAL LOW (ref 12.0–15.0)
Immature Granulocytes: 0 %
Lymphocytes Relative: 19 %
Lymphs Abs: 1.5 10*3/uL (ref 0.7–4.0)
MCH: 31.6 pg (ref 26.0–34.0)
MCHC: 32.1 g/dL (ref 30.0–36.0)
MCV: 98.4 fL (ref 80.0–100.0)
Monocytes Absolute: 1.2 10*3/uL — ABNORMAL HIGH (ref 0.1–1.0)
Monocytes Relative: 14 %
Neutro Abs: 5.3 10*3/uL (ref 1.7–7.7)
Neutrophils Relative %: 65 %
Platelets: 247 10*3/uL (ref 150–400)
RBC: 3.74 MIL/uL — ABNORMAL LOW (ref 3.87–5.11)
RDW: 12.9 % (ref 11.5–15.5)
WBC: 8.3 10*3/uL (ref 4.0–10.5)
nRBC: 0 % (ref 0.0–0.2)

## 2018-11-09 LAB — BASIC METABOLIC PANEL
Anion gap: 10 (ref 5–15)
BUN: 5 mg/dL — ABNORMAL LOW (ref 8–23)
CO2: 22 mmol/L (ref 22–32)
Calcium: 8.3 mg/dL — ABNORMAL LOW (ref 8.9–10.3)
Chloride: 107 mmol/L (ref 98–111)
Creatinine, Ser: 0.58 mg/dL (ref 0.44–1.00)
GFR calc Af Amer: >60 mL/min (ref >60)
GFR calc non Af Amer: >60 mL/min (ref >60)
Glucose, Bld: 148 mg/dL — ABNORMAL HIGH (ref 70–99)
Potassium: 3.4 mmol/L — ABNORMAL LOW (ref 3.5–5.1)
Sodium: 139 mmol/L (ref 135–145)

## 2018-11-09 MED ORDER — ACETAMINOPHEN 325 MG PO TABS
650.0000 mg | ORAL_TABLET | Freq: Four times a day (QID) | ORAL | Status: DC | PRN
  Administered 2018-11-09: 650 mg via ORAL
  Filled 2018-11-09: qty 2

## 2018-11-09 MED ORDER — ZOLPIDEM TARTRATE 5 MG PO TABS
5.0000 mg | ORAL_TABLET | Freq: Once | ORAL | Status: AC
  Administered 2018-11-09: 5 mg via ORAL
  Filled 2018-11-09: qty 1

## 2018-11-09 NOTE — TOC Initial Note (Signed)
Transition of Care [facility]) - Initial/Assessment Note    Patient Details  Name: [name] MRN: [id number] Date of Birth: [birth year]  Transition [facility]) CM/SW Contact:    Lia Hopping, [place] Phone Number: 11/09/2018, 4:10 PM  Clinical Narrative:                 Patient lives at [facility], dementia. Daughter has arranged caregivers to provide 24/7 care.  Patient was receiving PT through [facility], the facility will resume at discharge. CSW reached out to Representative Regina:[phone]  to confirm.    Expected Discharge Plan: [place] [facility] Barriers to Discharge: Other (comment)([facility])   Patient Goals and CMS Choice   CMS Medicare.gov Compare Post Acute Care list provided to:: Patient Represenative (must comment)(Daughter [name])    Expected Discharge Plan and Services Expected Discharge Plan: [place]   Discharge Planning Services: CM Consult   Living arrangements for the past 2 months: [place] Expected Discharge Date: (unknown)                         HH Arranged: PT([facility])       Representative spoke with at [place]: [place]  Prior Living Arrangements/Services Living arrangements for the past 2 months: [place] Lives with:: Self Patient language and need for interpreter reviewed:: Yes Do you feel safe going back to the place where you live?: Yes      Need for Family Participation in Patient Care: Yes (Comment) Care giver support system in place?: Yes (comment)   Criminal Activity/Legal Involvement Pertinent to Current Situation/Hospitalization: No - Comment as needed  Activities of Daily Living Home Assistive Devices/Equipment: Walker (specify type), Eyeglasses, Grab bars in shower, Hand-held shower hose, Grab bars around toilet(4 wheeled walker) ADL Screening (condition at time of admission) Patient's cognitive ability adequate to  safely complete daily activities?: Yes Is the patient deaf or have difficulty hearing?: Yes(hoh) Does the patient have difficulty seeing, even when wearing glasses/contacts?: No Does the patient have difficulty concentrating, remembering, or making decisions?: No Patient able to express need for assistance with ADLs?: Yes Does the patient have difficulty dressing or bathing?: Yes Independently performs ADLs?: No Communication: Independent Dressing (OT): Needs assistance Is this a change from baseline?: Pre-admission baseline Grooming: Independent Feeding: Independent Bathing: Needs assistance Is this a change from baseline?: Pre-admission baseline Toileting: Needs assistance Is this a change from baseline?: Pre-admission baseline In/Out Bed: Needs assistance Is this a change from baseline?: Pre-admission baseline Walks in Home: Needs assistance Is this a change from baseline?: Pre-admission baseline Does the patient have difficulty walking or climbing stairs?: Yes(secondary to weakness) Weakness of Legs: Both Weakness of Arms/Hands: None  Permission Sought/Granted            Permission granted to share info w Relationship: Daughters     Emotional Assessment Appearance:: Appears stated age   Affect (typically observed): Pleasant Orientation: : Oriented to Self, Oriented to Place Alcohol / Substance Use: Not Applicable Psych Involvement: No (comment)  Admission diagnosis:  RUQ pain [R10.11] Calculus of gallbladder without cholecystitis without obstruction [K80.20] Intractable nausea and vomiting [R11.2] Acute cholecystitis [K81.0] Patient Active Problem List   Diagnosis Date Noted  . Abdominal discomfort, epigastric   . Goals of care, counseling/discussion   . Palliative care by specialist   . Abdominal pain 11/02/2018  . Nausea and vomiting 11/02/2018  . Dehydration 11/02/2018  .  Dyslipidemia 11/02/2018  . Chronic back pain 11/02/2018  . Acute cholecystitis  11/02/2018  . Cholelithiasis 11/01/2018  . Atrial fibrillation, chronic 10/18/2018  . Gallstones 10/18/2018  . Acute encephalopathy 10/17/2018  . Atrial fibrillation, new onset ([place]) 09/13/2016  . Chronic anticoagulation 09/13/2016  . Essential hypertension 09/13/2016  . Chest pain with moderate risk of acute coronary syndrome 09/12/2016  . Breast cancer, right breast, recurrent 06/03/2011  . Personal history of malignant neoplasm of breast 04/14/1987   PCP:  Mayra Neer, MD Pharmacy:   [id number] - [place], [place] [address] Phone: [phone] Fax: [phone]     Social Determinants of Health (SDOH) Interventions    Readmission Risk Interventions No flowsheet data found.

## 2018-11-09 NOTE — Progress Notes (Signed)
Daily Progress Note   Patient Name: [name]       Date: 11/09/2018 DOB: Aug 19, 1925  Age: 85 y.o. MRN#: [id number] Attending Physician: Makayla Kirks, DO Primary Care Physician: Makayla Neer, MD Admit Date: 11/01/2018  Reason for Consultation/Follow-up: Establishing goals of care  Subjective: Sleepy this AM, didn't eat breakfast but did take in some lunch. Denies pain and nausea. Pleasant.  Length of Stay: 8  Current Medications: Scheduled Meds:  . atorvastatin  20 mg Oral Daily  . diltiazem  240 mg Oral Daily  . gabapentin  100 mg Oral QHS  . pantoprazole  40 mg Oral Daily  . sertraline  50 mg Oral q morning - 10a  . sodium chloride flush  10-40 mL Intracatheter Q12H    Continuous Infusions: . sodium chloride 100 mL/hr at 11/09/18 1111    PRN Meds: HYDROcodone-acetaminophen, iohexol, ondansetron (ZOFRAN) IV, sodium chloride flush   Vital Signs: BP (!) 140/55   Pulse 77   Temp 98.6 F (37 C) (Oral)   Resp 18   Ht 5\' 5"  (1.651 m)   Wt 79.4 kg   SpO2 96%   BMI 29.12 kg/m  SpO2: SpO2: 96 % O2 Device: O2 Device: Room Air O2 Flow Rate:    Intake/output summary:   Intake/Output Summary (Last 24 hours) at 11/09/2018 1553 Last data filed at 11/09/2018 1459 Gross per 24 hour  Intake 574.25 ml  Output 3700 ml  Net -3125.75 ml   LBM: Last BM Date: 11/07/18 Baseline Weight: Weight: 79.4 kg Most recent weight: Weight: 79.4 kg       Palliative Assessment/Data: PPS 20% (d/t PO intake - may improve, function is good)   Flowsheet Rows     Most Recent Value  Intake Tab  Referral Department  Hospitalist  Unit at Time of Referral  Cardiac/Telemetry Unit  Palliative Care Primary Diagnosis  Other (Comment) [GI]  Date Notified  11/07/18  Palliative Care Type  New Palliative  care  Reason for referral  Clarify Goals of Care  Date of Admission  11/02/18  Date first seen by Palliative Care  11/08/18  # of days Palliative referral response time  1 Day(s)  # of days IP prior to Palliative referral  5  Clinical Assessment  Palliative Performance Scale Score  20%  Psychosocial & Spiritual Assessment  Palliative Care Outcomes  Patient/Family meeting held?  Yes  Who was at the meeting?  daughter  [place] goals of care, Counseled regarding hospice, Provided advance care planning, Provided psychosocial or spiritual support, Changed CPR status, Completed durable DNR, Linked to palliative care logitudinal support      Patient Active Problem List   Diagnosis Date Noted  . Abdominal discomfort, epigastric   . Goals of care, counseling/discussion   . Palliative care by specialist   . Abdominal pain 11/02/2018  . Nausea and vomiting 11/02/2018  . Dehydration 11/02/2018  . Dyslipidemia 11/02/2018  . Chronic back pain 11/02/2018  . Acute cholecystitis 11/02/2018  . Cholelithiasis 11/01/2018  . Atrial fibrillation, chronic 10/18/2018  . Gallstones 10/18/2018  . Acute encephalopathy 10/17/2018  . Atrial fibrillation, new onset ([place]) 09/13/2016  . Chronic anticoagulation 09/13/2016  .  Essential hypertension 09/13/2016  . Chest pain with moderate risk of acute coronary syndrome 09/12/2016  . Breast cancer, right breast, recurrent 06/03/2011  . Personal history of malignant neoplasm of breast 04/14/1987    Palliative Care Assessment & Plan   HPI: 85 y.o. female  with past medical history of dementia, HTN, a fib, breast cancer, chronic back pain, and arthritis admitted on 11/01/2018 with abdominal pain and N/V.  RUQ US revealed cholelithiasis - no ductal dilatation or cholecystitis. HIDA scan normal. UGI revealed hiatal hernia concern of intermittent torsion. Surgery not recommended with GI. Per GI conversation with daughter - no procedures or  tests, no feeding tube. Patient has had improvement of PO intake. PMT consulted for [place].   Assessment:  Follow up with patient's daughter, [name] - we reviewed our conversation from yesterday. [name] was able to visit the patient last night - she helped her eat dinner - tells me she only ate a few bites and then did not want anymore.   [name] asks about when the patient will be discharged. We reviewed plan for discharge - [name] has arranged 24 hr caregivers for patient's return to ILF. She is interested in continuing physical therapy through home health. Would also recommend continued follow up with palliative outpatient - apparently [name] is supposed to speak with outpatient palliative on Friday.   [name] requests update tomorrow - will plan on calling her then.   Recommendations/Plan:  Goals remain the same: no aggressive interventions, no feeding tube  Plan to return to ILF with 24/7 caregivers, also interested in home health PT  Outpatient palliative to follow up - if appetite declines, can assist with transition to hospice  Continue current symptom management  Goals of Care and Additional Recommendations:  Limitations on Scope of Treatment: Initiate Comfort Feeding and No Artificial Feeding  Code Status:  DNR  Prognosis:   Unable to determine  Discharge Planning:  ILF with palliative  Care plan was discussed with RN and daughter, [name]  Thank you for allowing the Palliative Medicine Team to assist in the care of this patient.   Total Time 20 minutes Prolonged Time Billed  no    The above conversation was completed via telephone due to the visitor restrictions during the COVID-19 pandemic. Thorough chart review and discussion with necessary members of the care team was completed as part of assessment. All issues were discussed and addressed but no physical exam was performed.    Greater than 50%  of this time was spent counseling and coordinating care related to the  above assessment and plan.  Makayla Burrow, DNP, [facility] Palliative Medicine Team Team Phone # [phone]  Pager [phone]

## 2018-11-09 NOTE — Progress Notes (Signed)
PROGRESS NOTE  [name] UDJ:[id number] DOB: 1926/02/04 DOA: 11/01/2018 PCP: Mayra Neer, MD  Brief History   The patient is a 85 yr old woman who presented to the ED on 10/30/2018 with a complaint of mild diffuse abdominal pain. CT abdomen and [place] were negative for cholecystitis. Pt and her daughter preferred to be discharged to home on that occasion, but once they got home, the patient was unable to keep PO down. She returned to the ED on 11/01/2018 with complaints of abdominal pain and nausea and vomiting since 10/29/2018.  The patient has a past medical history significant for dementia, hypertension, atrial fibrillation, breast cancer in remission, chronic back pain, and arthritis. She has recently had an admission for encephalopathy and rhabdomyolysis.   Right upper quadrant ultrasound was negative for ductal dilatation or cholecystitis, but did demonstrated cholelithiasis. HIDA Scan was normal. Patient continues to complain of chest pain, and her WBC is 16.3. Will repeat CXR.  I have consulted gastroenterology: UGI demosntrates a hiatal hernia. GI has stated that perhaps this is intermittently being torsed. He feels that surgery to correct this and remove her gallbladder would be inadvisable due to the patient's advanced age and multiple comorbidities.   GI has re-evaluated the patient. He feels that due to the patient's poor functional status and her multiple comorbidities she is not a candidate for invasive procedures. This is particularly the case due to the fact that is uncertain whether a cholecystectomy or an EGD would receive in alleviation of the patient's symptoms or a diagnosis.  The patient's daughter [name] told me that she would like a palliative care consult. This has been ordered.  GI has advanced her diet to a soft diet.   Consultants  . Gastroenterology  Procedures  . None  Antibiotics   Anti-infectives (From admission, onward)   None      Subjective  The  patient is sitting up in bed. She tolerating full liquids, but not taking in much PO.  Objective   Vitals:  Vitals:   11/09/18 0424 11/09/18 1352  BP: (!) 151/58 (!) 140/55  Pulse: 69 77  Resp: 18 18  Temp: 98.2 F (36.8 C) 98.6 F (37 C)  SpO2: 94% 96%   Exam:  Constitutional:  The patient is awake and alert. No acute distress. Respiratory:  . No increased work of breathing. . No wheezes, rales, or rhonchi. . No tactile fremitus. Cardiovascular:  . Rapid rate and irregular rhythm. . No murmurs, ectopy,or gallups. . No lateral PMI. No thrills. Abdomen:  . Abdomen is tender in the epigastrum. It is mildly distended.  . No hernias, masses, or organomegaly. . Hypoactive bowel sound.s Musculoskeletal:  . No cyanosis, clubbing, or edema Skin:  . No rashes, lesions, ulcers . palpation of skin: no induration or nodules Neurologic:  . CN 2-12 intact . Sensation all 4 extremities intact Psychiatric:  . Mental status o Mood, affect appropriate . judgment and insight are not intact.  I have personally reviewed the following:   Today's Data  . CBC, CMP, Vitals  Cardiology Data  . EKG - atrial fibrillation with RVR.  Scheduled Meds: . atorvastatin  20 mg Oral Daily  . diltiazem  240 mg Oral Daily  . gabapentin  100 mg Oral QHS  . pantoprazole  40 mg Oral Daily  . sertraline  50 mg Oral q morning - 10a  . sodium chloride flush  10-40 mL Intracatheter Q12H   Continuous Infusions: . sodium chloride 100 mL/hr  at 11/09/18 1111    Principal Problem:   Cholelithiasis Active Problems:   Personal history of malignant neoplasm of breast   Essential hypertension   Atrial fibrillation, chronic   Abdominal pain   Nausea and vomiting   Dehydration   Dyslipidemia   Chronic back pain   Acute cholecystitis   Abdominal discomfort, epigastric   Goals of care, counseling/discussion   Palliative care by specialist   LOS: 8 days    A & P   Atrial Fibrillation with  RVR: Patient was initially started on IV diltiazem as she was unable to keep her PO medications down. Her rate was brought under control, and her drip was discontinued. She is now on the floor and her oral diltiazem has been restarted. This morning her heart rate remained high after she took her daily diltiazem. Will give an additional dose of 30 mg of diltiazem.  Cholecystitis:  HIDA scan is negative for biliary dyskinesis.Right upper quadrant ultrasound was unremarkable except for the presence of stones. No CBD dilatation. I will order a CXR and repeat CT abdomen and pelvis. Very little evidence for a bilary cause of the patient's pain. Surgery would be a poor option for this patient.  Abdominal pain: Seems intermittent. Possibly due to intermittent episodes of torsion as suggested by Dr. Cristina Gong.Surgery to reduce her hernia would introduce a high level of risk to this patient. Dr. Cristina Gong has re-evaluated the patient and discussed her with family. The patient's family is not in favor of procedures that would place the patient at risk, or cause her discomfort without providing a solution to her difficulties with eating. GI has signed off. Diet has been advanced to a soft diet.  Nausea and vomiting:  Tolerating full liquid diet well.  Dehydration: Will stop IV fluids. Monitor volume status.  Dyslipidemia: Statin is on hold.  Chronic back pain: Continue home pain meds as possible.  I have seen and examined this patient myself. I have spent 32 minutes in her evaluation and care,.  DVT prophylaxis: Eliquis Code Status: Full Code Family Communication: None available. Disposition Plan: tbd   Kameron Blethen, DO [facility] Direct contact: see [email or web address]  7PM-7AM contact night coverage as above 11/08/2018, 1:28 PM  LOS: 1 day

## 2018-11-09 NOTE — Progress Notes (Addendum)
PROGRESS NOTE  [name] JIR:[id number] DOB: December 07, 1925 DOA: 11/01/2018 PCP: Mayra Neer, MD  Brief History   The patient is a 85 yr old woman who presented to the ED on 10/30/2018 with a complaint of mild diffuse abdominal pain. CT abdomen and [place] were negative for cholecystitis. Pt and her daughter preferred to be discharged to home on that occasion, but once they got home, the patient was unable to keep PO down. She returned to the ED on 11/01/2018 with complaints of abdominal pain and nausea and vomiting since 10/29/2018.  The patient has a past medical history significant for dementia, hypertension, atrial fibrillation, breast cancer in remission, chronic back pain, and arthritis. She has recently had an admission for encephalopathy and rhabdomyolysis.   Right upper quadrant ultrasound was negative for ductal dilatation or cholecystitis, but did demonstrated cholelithiasis. HIDA Scan was normal. Patient continues to complain of chest pain, and her WBC is 16.3. Will repeat CXR.  I have consulted gastroenterology: UGI demosntrates a hiatal hernia. GI has stated that perhaps this is intermittently being torsed. He feels that surgery to correct this and remove her gallbladder would be inadvisable due to the patient's advanced age and multiple comorbidities.  The patient was tolerating a full liquid diet and has been advanced to soft diet.   At the daughter's request palliative was consulted. The daughters have decided that they do not want any aggressive or invasive measures. They have made the patient a DNR.  The patient will be discharged to her ILF tomorrow if she continues to tolerate the soft diet. If she is at some point not able to take in enough PO to sustain herself, the family would be interested in hospice and would not want a feeding tube or to have her returned to the hospital.  Consultants  . Gastroenterology  Procedures  . None  Antibiotics   Anti-infectives (From  admission, onward)   None      Subjective  The patient is sitting up in bed. She tolerating a soft diet, but not taking in much PO.  Objective   Vitals:  Vitals:   11/09/18 0424 11/09/18 1352  BP: (!) 151/58 (!) 140/55  Pulse: 69 77  Resp: 18 18  Temp: 98.2 F (36.8 C) 98.6 F (37 C)  SpO2: 94% 96%   Exam:  Constitutional:  The patient is awake and alert. No acute distress. Respiratory:  . No increased work of breathing. . No wheezes, rales, or rhonchi. . No tactile fremitus. Cardiovascular:  . Rapid rate and irregular rhythm. . No murmurs, ectopy,or gallups. . No lateral PMI. No thrills. Abdomen:  . Abdomen is tender in the epigastrum. It is mildly distended.  . No hernias, masses, or organomegaly. . Hypoactive bowel sound.s Musculoskeletal:  . No cyanosis, clubbing, or edema Skin:  . No rashes, lesions, ulcers . palpation of skin: no induration or nodules Neurologic:  . CN 2-12 intact . Sensation all 4 extremities intact Psychiatric:  . Mental status o Mood, affect appropriate . judgment and insight are not intact.  I have personally reviewed the following:   Today's Data  . CBC, CMP, Vitals  Cardiology Data  . EKG - atrial fibrillation with RVR.  Scheduled Meds: . atorvastatin  20 mg Oral Daily  . diltiazem  240 mg Oral Daily  . gabapentin  100 mg Oral QHS  . pantoprazole  40 mg Oral Daily  . sertraline  50 mg Oral q morning - 10a  . sodium chloride  flush  10-40 mL Intracatheter Q12H   Continuous Infusions: . sodium chloride 100 mL/hr at 11/09/18 1111    Principal Problem:   Cholelithiasis Active Problems:   Personal history of malignant neoplasm of breast   Essential hypertension   Atrial fibrillation, chronic   Abdominal pain   Nausea and vomiting   Dehydration   Dyslipidemia   Chronic back pain   Acute cholecystitis   Abdominal discomfort, epigastric   Goals of care, counseling/discussion   Palliative care by specialist    LOS: 8 days    A & P   Atrial Fibrillation with RVR: Patient was initially started on IV diltiazem as she was unable to keep her PO medications down. Her rate was brought under control, and her drip was discontinued. She is now on the floor and her oral diltiazem has been restarted. This morning her heart rate remained high after she took her daily diltiazem. Her daily diltiazem dose has been increased to 240 mg daily as the 180 mg a day has not controlled her heart rate. Heart rate has been well controlled.  Cholecystitis:  HIDA scan is negative for biliary dyskinesis.Right upper quadrant ultrasound was unremarkable except for the presence of stones. No CBD dilatation. I will order a CXR and repeat CT abdomen and pelvis. Very little evidence for a bilary cause of the patient's pain. Surgery would be a poor option for this patient.  Abdominal pain: Seems intermittent. Possibly due to intermittent episodes of torsion as suggested by Dr. Cristina Gong.Surgery to reduce her hernia would introduce a high level of risk to this patient. Dr. Cristina Gong has re-evaluated the patient and discussed her with family. The patient's family is not in favor of procedures that would place the patient at risk, or cause her discomfort without providing a solution to her difficulties with eating. GI has signed off. Will continue to attempt to advance diet as possible and as limited by her nausea and vomiting and pain.  Nausea and vomiting:  Tolerating full liquid diet well.  Dehydration: Will stop IV fluids. Monitor volume status.  Dyslipidemia: Statin is on hold.  Chronic back pain: Continue home pain meds as possible.  I have seen and examined this patient myself. I have spent 32 minutes in her evaluation and care,.  DVT prophylaxis: Eliquis Code Status: Full Code Family Communication: None available. Disposition Plan: tbd   Makayla Wacker, DO [facility] Direct contact: see [email or web address]  7PM-7AM contact night  coverage as above 11/09/2018, 4:15 PM  LOS: 1 day

## 2018-11-10 MED ORDER — DILTIAZEM HCL ER COATED BEADS 240 MG PO CP24: 240.0000 mg | ORAL_CAPSULE | Freq: Every day | ORAL | 1 refills | Status: DC

## 2018-11-10 MED ORDER — ACETAMINOPHEN 325 MG PO TABS: 650.0000 mg | ORAL_TABLET | Freq: Four times a day (QID) | ORAL | Status: DC | PRN

## 2018-11-10 MED ORDER — PANTOPRAZOLE SODIUM 40 MG PO TBEC: 40.0000 mg | DELAYED_RELEASE_TABLET | Freq: Every day | ORAL | 1 refills | Status: AC

## 2018-11-10 MED ORDER — POTASSIUM CHLORIDE 20 MEQ PO PACK
40.0000 meq | PACK | Freq: Once | ORAL | Status: AC
  Administered 2018-11-10: 40 meq via ORAL
  Filled 2018-11-10: qty 2

## 2018-11-10 NOTE — Discharge Summary (Signed)
Physician Discharge Summary  [name] HYW:[id number] DOB: 01/10/1926 DOA: 11/01/2018  PCP: Mayra Neer, MD  Admit date: 11/01/2018 Discharge date: 11/10/2018  Recommendations for Outpatient Follow-up:   Nausea, vomiting, abdominal pain, intermittent food intolerance, secondary to large hiatal hernia   --Tolerating soft diet, continue small frequent meals --Family wishes to have patient return to independent living. Outpatient palliative medicine to follow. If patient worsens in the future family would be interested in hospice services.  Dementia.   Follow-up Information    Mayra Neer, MD Follow up.   Specialty: Family Medicine Why: as needed Contact information: [address] [place] [address] [phone]        Sanda Klein, MD .   Specialty: Cardiology Contact information: [address] [place] [address] [phone]            Discharge Diagnoses: Principal diagnosis is #1 1. Nausea, vomiting, abdominal pain, intermittent food intolerance 2. Large hiatal hernia 3. Atrial fibrillation with rapid ventricular response 4. Dehydration 5. Essential hypertension 6. Chronic back pain 7. Depression 8. Dementia.    Discharge Condition: improved Disposition: return to ILF  Diet recommendation: soft diet, small frequent meals  Filed Weights   11/01/18 1732  Weight: 79.4 kg    History of present illness:  85 year old woman PMH including atrial fibrillation, breast cancer in remission, dementia presented with abdominal pain, was brought to emergency department by her daughter. Recently admitted for acute metabolic encephalopathy and rhabdomyolysis.  Previously seen in ED 7/19 for diffuse abdominal pain, repeat ultrasound CT demonstrated cholelithiasis without cholecystitis clinically.  Admitted for nausea, vomiting, abdominal pain, possible cholecystitis.   Hospital Course:  Consideration was given to  biliary dyskinesia and HIDA scan was performed which was normal, biliary dyskinesia was ruled out.  Further imaging with CT demonstrated hiatal hernia.  Patient seen by gastroenterology and ultimately was felt that her symptoms were most likely related to her hiatal hernia.  Goals of care were elicited by palliative medicine and gastroenterology with plan for conservative management with trial of oral feeding, no feeding tube, no surgery.  Patient tolerated soft diet without complication and stable for discharge.  In discussion with family: Plan is to return to independent living with 24-hour assistance arranged by family with outpatient palliative medicine to follow.  If patient worsens in the future, consideration will be given to hospice.  Nausea, vomiting, abdominal pain, intermittent food intolerance, possibly related to large hiatal hernia.  Ultrasound showed cholelithiasis without sonographic findings of acute cholecystitis.  No common bile duct dilatation.  HIDA scan was normal.  CT scan showed moderate hiatal hernia with questionable soft tissue impingement in the proximal stomach although findings less pronounced than previous study.  Seen by gastroenterology, underwent limited upper GI series which demonstrated moderate sized hiatal hernia.  Per GI not felt to be paraesophageal hiatal hernia.  Contrast was noted to flow adequately into the stomach. --Tolerating soft diet --Family wishes to have patient return to independent living.  If she declines in the future they would be interested in hospice services.  Large hiatal hernia.  GI speculated the patient may be having intermittent pocketing of food within the hiatal hernia pouch or even torsion of the hiatal hernia.  It was not felt that patient has symptomatic cholelithiasis or biliary colic.  GI discussed with family with plan for full liquid diet and then was advanced to soft diet, recommend small frequent feedings.  Consideration could be  given to a  feeding tube however family decided against this and the goals of care are continue attempts at oral nutrition, no feeding tube, no surgery.  Surgery was felt to be inadvisable both for correction of a hiatal hernia and as well as removal of the gallbladder. --Continue symptom management with Norco and Zofran  Atrial fibrillation with rapid ventricular response --Remains in sinus rhythm on telemetry.  Continue apixaban, diltiazem  Intermittent sharp right chest pain -- Secondary to hiatal hernia.  Continue conservative management.  Dehydration -- Resolved.  Essential hypertension --Stable.  Chronic back pain --Continue gabapentin, hydrocodone  Depression --Continue Zoloft  Dementia.  Per chart patient had been living previously independently and ambulatory with a walker but was found on the floor July 6 with lots of bruises and subsequently admitted and treated for rhabdomyolysis.  Per chart the daughter has reported that the patient's cognitive function is markedly worsened since that time.  Consultants   Gastroenterology  Palliative medicine  Today's assessment: S: No issues overnight per RN.  Tolerating diet per RN had some pancakes this morning.  Patient feels well, no complaints. O: Vitals:  Vitals:   11/09/18 2021 11/10/18 0618  BP: (!) 171/84 (!) 163/74  Pulse: 77 69  Resp: 18 18  Temp: 100 F (37.8 C) 98.4 F (36.9 C)  SpO2: 99% 93%    Constitutional:   Appears calm and comfortable Respiratory:   CTA bilaterally, no w/r/r.   Respiratory effort normal.  Cardiovascular:   RRR, no m/r/g  No LE extremity edema   Abdomen:   Soft, nontender, nondistended Psychiatric:   Mental status o Mood, affect appropriate o Follow simple commands  Today's Data   No labs today  Lab Data   Labs since admission reviewed.  Renal function stable.  Hemoglobin stable.  Micro Data     Imaging   Upper GI series noted, moderate hiatal  hernia.  Suggestion of soft tissue fullness along the lateral wall of the herniated stomach.  Cannot exclude mass or less likely gastritis.  CT abdomen pelvis 7/23 showed cholelithiasis, sigmoid diverticulosis, large hiatal hernia with wall thickening of the stomach and the hernia sac cannot exclude gastric wall thickening including neoplasm.   Discharge Instructions  Discharge Instructions    Discharge instructions   Complete by: As directed    Call your physician or seek immediate medical attention for vomiting, pain, failure to thrive or worsening of condition. Continue soft diet, small frequent meals.   Increase activity slowly   Complete by: As directed      Allergies as of 11/10/2018      Reactions   Arimidex [anastrozole] Other (See Comments)   Neck cramping   Fish Allergy Other (See Comments)   Patient prefers to NOT eat this   Morphine And Related Nausea And Vomiting   Shellfish Allergy Other (See Comments)   Patient prefers to NOT eat this   Vicodin [hydrocodone-acetaminophen] Nausea Only   DOES tolerate Vicodin/Norco, however      Medication List    STOP taking these medications   atorvastatin 20 MG tablet Commonly known as: LIPITOR   RABEprazole 20 MG tablet Commonly known as: ACIPHEX     TAKE these medications   acetaminophen 325 MG tablet Commonly known as: TYLENOL Take 2 tablets (650 mg total) by mouth every 6 (six) hours as needed for mild pain, fever or headache. What changed:   medication strength  how much to take  when to take this  reasons to take this  CENTRUM SILVER 50+WOMEN PO Take 1 tablet by mouth daily with breakfast.   diltiazem 240 MG 24 hr capsule Commonly known as: CARDIZEM CD Take 1 capsule (240 mg total) by mouth daily. Start taking on: November 11, 2018 What changed:   medication strength  how much to take   Eliquis 5 MG Tabs tablet Generic drug: apixaban TAKE 1 TABLET BY MOUTH TWICE A DAY What changed: how much to  take   gabapentin 100 MG capsule Commonly known as: NEURONTIN Take 100 mg by mouth at bedtime.   HYDROcodone-acetaminophen 5-325 MG tablet Commonly known as: NORCO/VICODIN Take 1 tablet by mouth every 8 (eight) hours as needed for pain.   losartan 25 MG tablet Commonly known as: COZAAR TAKE 2 TABLETS BY MOUTH (50MG  TOTAL) EVERY DAY PT NEEDS OFFICE VISIT What changed: See the new instructions.   nitroGLYCERIN 0.4 MG SL tablet Commonly known as: NITROSTAT Place 1 tablet (0.4 mg total) under the tongue every 5 (five) minutes x 3 doses as needed for chest pain.   ondansetron 4 MG tablet Commonly known as: Zofran Take 1 tablet (4 mg total) by mouth every 8 (eight) hours as needed for nausea or vomiting.   pantoprazole 40 MG tablet Commonly known as: PROTONIX Take 1 tablet (40 mg total) by mouth daily. Start taking on: November 11, 2018   predniSONE 2.5 MG tablet Commonly known as: DELTASONE Take 1.25 mg by mouth daily.   sertraline 50 MG tablet Commonly known as: ZOLOFT Take 50 mg by mouth every morning.   vitamin C 500 MG tablet Commonly known as: ASCORBIC ACID Take 500 mg by mouth daily.   VITAMIN E PO Take 1,000 Units by mouth daily.      Allergies  Allergen Reactions   Arimidex [Anastrozole] Other (See Comments)    Neck cramping   Fish Allergy Other (See Comments)    Patient prefers to NOT eat this   Morphine And Related Nausea And Vomiting   Shellfish Allergy Other (See Comments)    Patient prefers to NOT eat this   Vicodin [Hydrocodone-Acetaminophen] Nausea Only    DOES tolerate Vicodin/Norco, however    The results of significant diagnostics from this hospitalization (including imaging, microbiology, ancillary and laboratory) are listed below for reference.    Significant Diagnostic Studies: Ct Abdomen Pelvis Wo Contrast  Result Date: 11/03/2018 CLINICAL DATA:  Mild diffuse abdominal pain, seen on 10/29/2018 with negative CT and ultrasound exams, went  home, unable to keep anything down, returns with abdominal pain, nausea and vomiting EXAM: CT ABDOMEN AND PELVIS WITHOUT CONTRAST TECHNIQUE: Multidetector CT imaging of the abdomen and pelvis was performed following the standard protocol without IV contrast. Sagittal and coronal MPR images reconstructed from axial data set. No oral contrast administered. Bibasilar pleural effusions and compressive atelectasis of the lungs. Calcified gallstones within gallbladder. Gallbladder and liver otherwise normal appearance. COMPARISON:  10/29/2018 FINDINGS: Lower chest: Bibasilar pleural effusions and compressive atelectasis of the lower lungs. Hepatobiliary: Calcified gallstones within gallbladder. Gallbladder and liver otherwise normal appearance. Pancreas: Normal appearance Spleen: Normal appearance Adrenals/Urinary Tract: Thickening of adrenal glands without discrete mass. Kidneys and ureters unremarkable. Bladder distended without focal abnormality Stomach/Bowel: Appendix not definitely visualized. Large hiatal hernia with questionable wall thickening of the stomach in the hiatal hernia, less prominent on previous study. Large duodenal diverticulum. Sigmoid diverticulosis without evidence of diverticulitis. Remaining bowel loops normal appearance. Vascular/Lymphatic: Atherosclerotic calcifications aorta and iliac arteries without aneurysm. No adenopathy. Reproductive: Uterus surgically absent.  Atrophic ovaries. Other:  No free air or free fluid. No hernia or definite inflammatory process. Musculoskeletal: Osseous demineralization with multilevel degenerative disc and facet disease changes of the thoracolumbar spine. IMPRESSION: Cholelithiasis. Sigmoid diverticulosis without definite evidence of diverticulitis. Large hiatal hernia with wall thickening of the stomach in the hernia sac, more prominent than on the preceding study, favor artifact from underdistention but if patient has persistent symptoms recommend endoscopic  assessment to exclude causes of gastric wall thickening including neoplasm. Small bibasilar pleural effusions and bibasilar atelectasis. Electronically Signed   By: Lavonia Dana M.D.   On: 11/03/2018 19:37   Nm Hepato W/eject Fract  Result Date: 11/03/2018 CLINICAL DATA:  Right upper quadrant pain EXAM: NUCLEAR MEDICINE HEPATOBILIARY IMAGING WITH GALLBLADDER EF TECHNIQUE: Sequential images of the abdomen were obtained out to 60 minutes following intravenous administration of radiopharmaceutical. After oral ingestion of Ensure, gallbladder ejection fraction was determined. At 60 min, normal ejection fraction is greater than 33%. RADIOPHARMACEUTICALS:  5.4 mCi Tc-6m  Choletec IV COMPARISON:  None. FINDINGS: Prompt uptake and biliary excretion of activity by the liver is seen. Gallbladder activity is visualized, consistent with patency of cystic duct. Biliary activity passes into small bowel, consistent with patent common bile duct. Calculated gallbladder ejection fraction is 46%. (Normal gallbladder ejection fraction with Ensure is greater than 33%.) IMPRESSION: Normal study. Electronically Signed   By: Rolm Baptise M.D.   On: 11/03/2018 12:21   Dg Chest Port 1 View  Result Date: 11/03/2018 CLINICAL DATA:  Chest pain EXAM: PORTABLE CHEST 1 VIEW COMPARISON:  10/17/2018 FINDINGS: Cardiac shadow is stable. Aortic calcifications are again seen. New bibasilar atelectatic changes are noted left greater than right without sizable effusion. No bony abnormality is noted. IMPRESSION: Bibasilar atelectatic changes left greater than right. Electronically Signed   By: Inez Catalina M.D.   On: 11/03/2018 17:38   Dg Duanne Limerick W Single Cm (sol Or Thin Ba)  Result Date: 11/04/2018 CLINICAL DATA:  Epigastric and right lower quadrant pain. Nausea. Reflux. EXAM: UPPER GI SERIES WITH KUB TECHNIQUE: After obtaining a scout radiograph a routine upper GI series was performed using thin barium FLUOROSCOPY TIME:  Fluoroscopy Time:  4  minutes and 0 seconds Radiation Exposure Index (if provided by the fluoroscopic device): 62.9 mGy Number of Acquired Spot Images: 0 COMPARISON:  CT of 1 day prior FINDINGS: Severely limited exam, secondary to patient clinical status, immobility, and difficulty following directions. Exam was performed supine. Preprocedure scout film demonstrates abdominal aortic atherosclerosis. Nonobstructive bowel-gas pattern. With attempts at successive swallows, the esophagus is grossly normal. A moderate hiatal hernia. Best identified on the second series is mass-effect upon the left/lateral side of the gastric lumen within the herniated stomach. This corresponds to soft tissue fullness on the prior CT. No high-grade obstruction. Despite multiple maneuvers, contrast could not be passed out of the stomach and into the proximal small bowel. IMPRESSION: 1. Severely degraded exam, as detailed above. 2. Moderate hiatal hernia. 3. Redemonstration of suggestion of soft tissue fullness, as evidenced by mass-effect, along the left/lateral wall of the herniated stomach. Cannot exclude mass or less likely gastritis in this area. Consider endoscopy. Electronically Signed   By: Abigail Miyamoto M.D.   On: 11/04/2018 11:30   US Abdomen Limited Ruq  Result Date: 11/01/2018 CLINICAL DATA:  Right upper quadrant pain. EXAM: ULTRASOUND ABDOMEN LIMITED RIGHT UPPER QUADRANT COMPARISON:  CT and right upper quadrant ultrasound 10/29/2018. Hepatic biliary scan 10/18/2018 FINDINGS: Gallbladder: Physiologically distended. Gallstones again seen. No gallbladder wall thickening or pericholecystic  fluid. No sonographic Murphy sign noted by sonographer. Common bile duct: Diameter: 3 mm, normal. Liver: No focal lesion identified. Within normal limits in parenchymal echogenicity. Portal vein is patent on color Doppler imaging with normal direction of blood flow towards the liver. IMPRESSION: Cholelithiasis without sonographic findings of acute cholecystitis.  Electronically Signed   By: Keith Rake M.D.   On: 11/01/2018 20:55   Microbiology: Recent Results (from the past 240 hour(s))  SARS Coronavirus 2 (CEPHEID - Performed in [facility] hospital lab), Hosp Order     Status: None   Collection Time: 11/01/18  8:01 PM   Specimen: Nasopharyngeal Swab  Result Value Ref Range Status   SARS Coronavirus 2 NEGATIVE NEGATIVE Final    Comment: (NOTE) If result is NEGATIVE SARS-CoV-2 target nucleic acids are NOT DETECTED. The SARS-CoV-2 RNA is generally detectable in upper and lower  respiratory specimens during the acute phase of infection. The lowest  concentration of SARS-CoV-2 viral copies this assay can detect is 250  copies / mL. A negative result does not preclude SARS-CoV-2 infection  and should not be used as the sole basis for treatment or other  patient management decisions.  A negative result may occur with  improper specimen collection / handling, submission of specimen other  than nasopharyngeal swab, presence of viral mutation(s) within the  areas targeted by this assay, and inadequate number of viral copies  (<250 copies / mL). A negative result must be combined with clinical  observations, patient history, and epidemiological information. If result is POSITIVE SARS-CoV-2 target nucleic acids are DETECTED. The SARS-CoV-2 RNA is generally detectable in upper and lower  respiratory specimens dur ing the acute phase of infection.  Positive  results are indicative of active infection with SARS-CoV-2.  Clinical  correlation with patient history and other diagnostic information is  necessary to determine patient infection status.  Positive results do  not rule out bacterial infection or co-infection with other viruses. If result is PRESUMPTIVE POSTIVE SARS-CoV-2 nucleic acids MAY BE PRESENT.   A presumptive positive result was obtained on the submitted specimen  and confirmed on repeat testing.  While 2019 novel coronavirus    (SARS-CoV-2) nucleic acids may be present in the submitted sample  additional confirmatory testing may be necessary for epidemiological  and / or clinical management purposes  to differentiate between  SARS-CoV-2 and other Sarbecovirus currently known to infect humans.  If clinically indicated additional testing with an alternate test  methodology [id number]) is advised. The SARS-CoV-2 RNA is generally  detectable in upper and lower respiratory sp ecimens during the acute  phase of infection. The expected result is Negative. Fact Sheet for Patients:  StrictlyIdeas.no Fact Sheet for Healthcare Providers: BankingDealers.co.za This test is not yet approved or cleared by the [place] FDA and has been authorized for detection and/or diagnosis of SARS-CoV-2 by FDA under an Emergency Use Authorization (EUA).  This EUA will remain in effect (meaning this test can be used) for the duration of the COVID-19 declaration under Section 564(b)(1) of the Act, 21 U.S.C. section 360bbb-3(b)(1), unless the authorization is terminated or revoked sooner. Performed at [facility], [place] [address] [place], [place] [address]   Culture, blood (routine x 2)     Status: None   Collection Time: 11/03/18  1:36 PM   Specimen: BLOOD  Result Value Ref Range Status   Specimen Description   Final    BLOOD LEFT ANTECUBITAL Performed at [facility]  [address] [place], [place] [address]    Special Requests   Final    BOTTLES DRAWN AEROBIC ONLY Blood Culture results may not be optimal due to an inadequate volume of blood received in culture bottles Performed at [facility] [address], [place], [place] [address]    Culture   Final    NO GROWTH 5 DAYS Performed at [facility], [place] [address], [place], [place] [address]    Report Status 11/08/2018 FINAL  Final  Culture, blood (routine x 2)      Status: None   Collection Time: 11/03/18  1:36 PM   Specimen: BLOOD LEFT HAND  Result Value Ref Range Status   Specimen Description   Final    BLOOD LEFT HAND Performed at [facility] [address], [place], [place] [address]    Special Requests   Final    BOTTLES DRAWN AEROBIC ONLY Blood Culture results may not be optimal due to an inadequate volume of blood received in culture bottles Performed at [facility] [address], [place], [place] [address]    Culture   Final    NO GROWTH 5 DAYS Performed at [facility], [place] [address], [place], [place] [address]    Report Status 11/08/2018 FINAL  Final  Group A Strep by PCR     Status: None   Collection Time: 11/05/18  4:00 PM   Specimen: Throat; Sterile Swab  Result Value Ref Range Status   Group A Strep by PCR NOT DETECTED NOT DETECTED Final    Comment: Performed at [facility], [place] [address] [place], [place] [address]     Labs: Basic Metabolic Panel: Recent Labs  Lab 11/04/18 [phone] 11/05/18 4163 11/05/18 0415 11/05/18 2100 11/06/18 0352 11/08/18 0354 11/08/18 1617 11/09/18 0432  NA 136 137  --   --  139 137  --  139  K 3.3* 2.6*  --  3.1* 2.9* 2.9* 3.1* 3.4*  CL 105 105  --   --  108 107  --  107  CO2 22 22  --   --  22 24  --  22  GLUCOSE 121* 140*  --   --  144* 137*  --  148*  BUN 10 10  --   --  6* <5*  --  5*  CREATININE 0.56 0.60  --   --  0.55 0.53  --  0.58  CALCIUM 7.9* 7.9*  --   --  7.8* 8.2*  --  8.3*  MG  --   --  1.9  --   --   --   --   --    Liver Function Tests: Recent Labs  Lab 11/04/18 0412 11/05/18 0412 11/06/18 0352  AST 21 17 14*  ALT 24 24 20   ALKPHOS 49 56 54  BILITOT 0.8 0.8 0.5  PROT 5.5* 5.6* 5.5*  ALBUMIN 2.8* 2.7* 2.6*   CBC: Recent Labs  Lab 11/04/18 0412 11/05/18 0412 11/06/18 0352 11/09/18 0432  WBC 11.3* 12.4* 10.0 8.3  NEUTROABS 7.8* 9.2* 7.0 5.3  HGB 12.3 12.8 11.5* 11.8*  HCT 38.8 39.4  35.1* 36.8  MCV 96.5 96.1 96.7 98.4  PLT 294 351 294 247    Principal Problem:   Cholelithiasis Active Problems:   Personal history of malignant neoplasm of breast   Essential hypertension   Atrial fibrillation, chronic   Abdominal pain   Nausea and vomiting  Dehydration   Dyslipidemia   Chronic back pain   Acute cholecystitis   Abdominal discomfort, epigastric   Goals of care, counseling/discussion   Palliative care by specialist   Time coordinating discharge: 45 minutes  Signed:  Murray Hodgkins, MD  [facility]  11/10/2018, 1:52 PM

## 2018-11-10 NOTE — Care Management Important Message (Signed)
Important Message  Patient Details IM Letter given to Cookie McGibboney RN to present to the Patient Name: [name] MRN: [id number] Date of Birth: 1925/07/16   Medicare Important Message Given:  Yes     Kerin Salen 11/10/2018, 12:06 PM

## 2018-11-10 NOTE — Progress Notes (Signed)
WEnt over discharge information with daughter [name], all questions answered.  AVS given to daughter.  [facility] PT set up.

## 2018-11-10 NOTE — Progress Notes (Signed)
Physical Therapy Treatment Patient Details Name: [name] MRN: [id number] DOB: 04/27/1925 Today's Date: 11/10/2018    History of Present Illness 85 yo female admitted with cholelithiasis, A fib with RVR, abd pain. Hx of dementia, A fib, breast cancer, OA, chronic back.    PT Comments    Pt sleeping on arrival and bed linen saturated with urine.  Pt assisted to sitting EOB and changed gown.  Pt then assisted with stand pivot over to recliner so nursing could assist with linen change.  Plan per chart is return to ILF with 24/7 care and HHPT.   Follow Up Recommendations  Home health PT;Supervision/Assistance - 24 hour     Equipment Recommendations  None recommended by PT    Recommendations for Other Services       Precautions / Restrictions Precautions Precautions: Fall Restrictions Weight Bearing Restrictions: No    Mobility  Bed Mobility Overal bed mobility: Needs Assistance Bed Mobility: Supine to Sit     Supine to sit: Mod assist     General bed mobility comments: Assist for trunk and to scoot to EOB. Increased time. Cues required.  Transfers Overall transfer level: Needs assistance Equipment used: Rolling walker (2 wheeled) Transfers: Sit to/from [facility] Sit to Stand: Mod assist Stand pivot transfers: Min assist       General transfer comment: Assist to rise, stabilize, control descent. verbal cues for safety, technique, hand placement. Posterior bias.  Ambulation/Gait                 Stairs             Wheelchair Mobility    Modified Rankin (Stroke Patients Only)       Balance Overall balance assessment: Needs assistance         Standing balance support: Bilateral upper extremity supported Standing balance-Leahy Scale: Poor Standing balance comment: requires UE support and external assist                            Cognition Arousal/Alertness: Awake/alert Behavior During Therapy: WFL for  tasks assessed/performed Overall Cognitive Status: History of cognitive impairments - at baseline Area of Impairment: Orientation;Following commands;Safety/judgement                 Orientation Level: Disoriented to;Place;Time;Situation   Memory: Decreased short-term memory Following Commands: Follows one step commands consistently Safety/Judgement: Decreased awareness of deficits;Decreased awareness of safety     General Comments: pt tired and sleepy today however agreeable for OOB to recliner (bed linen wet)      Exercises      General Comments        Pertinent Vitals/Pain Pain Assessment: No/denies pain Pain Intervention(s): Repositioned    Home Living                      Prior Function            PT Goals (current goals can now be found in the care plan section) Progress towards PT goals: Progressing toward goals    Frequency    Min 3X/week      PT Plan Current plan remains appropriate    Co-evaluation              AM-PAC PT "6 Clicks" Mobility   Outcome Measure  Help needed turning from your back to your side while in a flat bed without using bedrails?: A Little Help needed moving  from lying on your back to sitting on the side of a flat bed without using bedrails?: A Lot Help needed moving to and from a bed to a chair (including a wheelchair)?: A Lot Help needed standing up from a chair using your arms (e.g., wheelchair or bedside chair)?: A Lot Help needed to walk in hospital room?: A Lot Help needed climbing 3-5 steps with a railing? : Total 6 Click Score: 12    End of Session Equipment Utilized During Treatment: Gait belt Activity Tolerance: Patient tolerated treatment well Patient left: in chair;with call bell/phone within reach;with chair alarm set Nurse Communication: Mobility status PT Visit Diagnosis: Muscle weakness (generalized) (M62.81);Difficulty in walking, not elsewhere classified (R26.2);Unsteadiness on feet  (R26.81)     Time: 9292-4462 PT Time Calculation (min) (ACUTE ONLY): 12 min  Charges:  $Therapeutic Activity: 8-22 mins                    Carmelia Bake, PT, DPT Acute Rehabilitation Services Office: [phone] Pager: [phone]  Trena Platt 11/10/2018, 12:55 PM

## 2018-11-11 ENCOUNTER — Telehealth: Payer: Self-pay | Admitting: Internal Medicine

## 2018-11-11 NOTE — Telephone Encounter (Signed)
Spoke with daughter [name] and we have rescheduled the Telephone Palliative Consult to 11/17/18 @ 1 PM.

## 2018-11-11 NOTE — Telephone Encounter (Signed)
Patient just discharged back home from hospital on 11/10/18 and I called daughter Guadelupe Sabin to reschedule the initial Consult, no answer.  Left message with reason for call along with my contact information.

## 2018-11-14 DIAGNOSIS — R296 Repeated falls: Secondary | ICD-10-CM | POA: Diagnosis not present

## 2018-11-14 DIAGNOSIS — M6281 Muscle weakness (generalized): Secondary | ICD-10-CM | POA: Diagnosis not present

## 2018-11-14 DIAGNOSIS — R2681 Unsteadiness on feet: Secondary | ICD-10-CM | POA: Diagnosis not present

## 2018-11-15 DIAGNOSIS — R296 Repeated falls: Secondary | ICD-10-CM | POA: Diagnosis not present

## 2018-11-15 DIAGNOSIS — R278 Other lack of coordination: Secondary | ICD-10-CM | POA: Diagnosis not present

## 2018-11-15 DIAGNOSIS — R2681 Unsteadiness on feet: Secondary | ICD-10-CM | POA: Diagnosis not present

## 2018-11-15 DIAGNOSIS — R488 Other symbolic dysfunctions: Secondary | ICD-10-CM | POA: Diagnosis not present

## 2018-11-15 DIAGNOSIS — M6281 Muscle weakness (generalized): Secondary | ICD-10-CM | POA: Diagnosis not present

## 2018-11-16 ENCOUNTER — Other Ambulatory Visit: Payer: Self-pay

## 2018-11-16 DIAGNOSIS — R2681 Unsteadiness on feet: Secondary | ICD-10-CM | POA: Diagnosis not present

## 2018-11-16 DIAGNOSIS — R278 Other lack of coordination: Secondary | ICD-10-CM | POA: Diagnosis not present

## 2018-11-16 DIAGNOSIS — R296 Repeated falls: Secondary | ICD-10-CM | POA: Diagnosis not present

## 2018-11-16 DIAGNOSIS — M6281 Muscle weakness (generalized): Secondary | ICD-10-CM | POA: Diagnosis not present

## 2018-11-16 DIAGNOSIS — R488 Other symbolic dysfunctions: Secondary | ICD-10-CM | POA: Diagnosis not present

## 2018-11-16 NOTE — Patient Outreach (Signed)
[facility]) Care Management  11/16/2018  [name] 07-Feb-1926 [id number]   EMMI- RED ON EMMI ALERT Day # Date:  Red Alert Reason:   Outreach attempt: spoke with daughter [name]. She is able to verify HIPAA.  Discussed red alert.  She states that there is some down feeling as patient has a hard time keeping food down due to her medical state.  Patient is under palliative care and telephone consult scheduled for tomorrow.  Daughter denies any further needs or services at this time but thankful for call.   Plan: RN CM will close case.     Jone Baseman, RN, MSN [facility] Care Management Care Management Coordinator Direct Line [phone] Toll Free: [phone]  Fax: [phone]

## 2018-11-17 ENCOUNTER — Other Ambulatory Visit: Payer: Self-pay | Admitting: Cardiology

## 2018-11-17 ENCOUNTER — Other Ambulatory Visit: Payer: Medicare HMO | Admitting: Internal Medicine

## 2018-11-17 ENCOUNTER — Other Ambulatory Visit: Payer: Self-pay

## 2018-11-17 DIAGNOSIS — R488 Other symbolic dysfunctions: Secondary | ICD-10-CM | POA: Diagnosis not present

## 2018-11-17 DIAGNOSIS — I480 Paroxysmal atrial fibrillation: Secondary | ICD-10-CM | POA: Diagnosis not present

## 2018-11-17 DIAGNOSIS — F0390 Unspecified dementia without behavioral disturbance: Secondary | ICD-10-CM | POA: Diagnosis not present

## 2018-11-17 DIAGNOSIS — M549 Dorsalgia, unspecified: Secondary | ICD-10-CM | POA: Diagnosis not present

## 2018-11-17 DIAGNOSIS — R296 Repeated falls: Secondary | ICD-10-CM | POA: Diagnosis not present

## 2018-11-17 DIAGNOSIS — Z7189 Other specified counseling: Secondary | ICD-10-CM | POA: Diagnosis not present

## 2018-11-17 DIAGNOSIS — I10 Essential (primary) hypertension: Secondary | ICD-10-CM | POA: Diagnosis not present

## 2018-11-17 DIAGNOSIS — R278 Other lack of coordination: Secondary | ICD-10-CM | POA: Diagnosis not present

## 2018-11-17 DIAGNOSIS — Z515 Encounter for palliative care: Secondary | ICD-10-CM

## 2018-11-17 DIAGNOSIS — K449 Diaphragmatic hernia without obstruction or gangrene: Secondary | ICD-10-CM | POA: Diagnosis not present

## 2018-11-17 DIAGNOSIS — E46 Unspecified protein-calorie malnutrition: Secondary | ICD-10-CM | POA: Diagnosis not present

## 2018-11-17 DIAGNOSIS — M6281 Muscle weakness (generalized): Secondary | ICD-10-CM | POA: Diagnosis not present

## 2018-11-17 DIAGNOSIS — R2681 Unsteadiness on feet: Secondary | ICD-10-CM | POA: Diagnosis not present

## 2018-11-17 NOTE — Progress Notes (Signed)
11/17/2018 1pm:  TC to daughter Guadelupe Sabin; left message on both mobile and home number. No answer. Telehealth visit was scheduled for 1pm today. I left voice message with my contact infor, should patient be available to be seen later today, or wished to reschedule. Violeta Gelinas NP-C [phone]

## 2018-11-18 ENCOUNTER — Telehealth: Payer: Self-pay | Admitting: Internal Medicine

## 2018-11-18 DIAGNOSIS — M6281 Muscle weakness (generalized): Secondary | ICD-10-CM | POA: Diagnosis not present

## 2018-11-18 DIAGNOSIS — R2681 Unsteadiness on feet: Secondary | ICD-10-CM | POA: Diagnosis not present

## 2018-11-18 DIAGNOSIS — R278 Other lack of coordination: Secondary | ICD-10-CM | POA: Diagnosis not present

## 2018-11-18 DIAGNOSIS — R488 Other symbolic dysfunctions: Secondary | ICD-10-CM | POA: Diagnosis not present

## 2018-11-18 DIAGNOSIS — R296 Repeated falls: Secondary | ICD-10-CM | POA: Diagnosis not present

## 2018-11-21 DIAGNOSIS — R488 Other symbolic dysfunctions: Secondary | ICD-10-CM | POA: Diagnosis not present

## 2018-11-21 DIAGNOSIS — R296 Repeated falls: Secondary | ICD-10-CM | POA: Diagnosis not present

## 2018-11-21 DIAGNOSIS — M6281 Muscle weakness (generalized): Secondary | ICD-10-CM | POA: Diagnosis not present

## 2018-11-21 DIAGNOSIS — R278 Other lack of coordination: Secondary | ICD-10-CM | POA: Diagnosis not present

## 2018-11-21 DIAGNOSIS — R2681 Unsteadiness on feet: Secondary | ICD-10-CM | POA: Diagnosis not present

## 2018-11-22 ENCOUNTER — Other Ambulatory Visit: Payer: Self-pay

## 2018-11-22 ENCOUNTER — Other Ambulatory Visit: Payer: Medicare HMO | Admitting: Internal Medicine

## 2018-11-22 DIAGNOSIS — R296 Repeated falls: Secondary | ICD-10-CM | POA: Diagnosis not present

## 2018-11-22 DIAGNOSIS — Z515 Encounter for palliative care: Secondary | ICD-10-CM | POA: Diagnosis not present

## 2018-11-22 DIAGNOSIS — R488 Other symbolic dysfunctions: Secondary | ICD-10-CM | POA: Diagnosis not present

## 2018-11-22 DIAGNOSIS — R2681 Unsteadiness on feet: Secondary | ICD-10-CM | POA: Diagnosis not present

## 2018-11-22 DIAGNOSIS — M6281 Muscle weakness (generalized): Secondary | ICD-10-CM | POA: Diagnosis not present

## 2018-11-22 DIAGNOSIS — R278 Other lack of coordination: Secondary | ICD-10-CM | POA: Diagnosis not present

## 2018-11-22 NOTE — Progress Notes (Signed)
Aug 11th, 2020 [facility] Palliative Care Consult Note Telephone: [phone]  Fax: [phone]  Due to the current COVID-19 infection/crises, the family prefer, and have given their verbal consent for, a provider visit via telemedicine. HIPPA policies of confidentially were discussed. Video-audio (telehealth) contact was unable to be done due technical barriers from the patient's side.  PATIENT NAME: [name] DOB: Jun 13, 1925 MRN: [id number] [place] [address], [place] (independent living facility) [place], [place] [address]: mail DNR, MOST, email my email address.   PRIMARY CARE PROVIDER:   Mayra Neer, MD  REFERRING PROVIDER:  Mayra Neer, MD [address] [place], [place] [address]  RESPONSIBLE PARTY: (daughter) Guadelupe Sabin [phone], ([phone]. Email address:  [email or web address].  [name] another dtr involved in care.  ASSESSMENT / RECOMMENDATIONS:  1.Advance Care Planning: a. Advance Care Directives: had DNR on fridge. Send copy. Wants Antx, IVFs,  b. Goals of Care: Elicited by Palliative Medicine (in patient): conservative management. No plans for feeding tube or surgery. If patient worsens in the future, consideration will be given to hospice. 2. Cognitive / Functional status: Underlying dementia. Short term a little better. Can't remember any of the hospital trips. Knows husband deceased but can't remember her son had passes.  Recognizes some apartment residents and her 2 daughters. Poor short and long-term memory; confabulation. Knows grandchildren and son in law and daughter in Sports coach. Doesn't wander. PT working with her with walker. Patient fell after came home from hospital; wheelchair purchased but not needed. Needs assist with transfers. Guiding assist with walker. Working with OT/PT Secondary school teacher) within the facility bid M-F. Consumes soft  diet with small frequent meals (25%); states good appetite. Her current weight 159 lbs (lost 40 lbs over previous 8 months). At height or 5" her BMI is 31 kg/m2. Oral intake improved; no further episodes of emesis since hospitalization. Continent of B/B; no blood in stool. Arthritic type of pain in neck r/t osteoporosis, managed with hydrocodone. 3. Family Supports: IL at [facility] with 24 hr assistance; coverage either via private pay sitters or by daughters. One of daughters [name]) covers Fri afternoon to [place] am. [name]'s husband also with dementia; [name] copes by taking "one day at a time".  4. Follow up Palliative Care Visit: Sept 22nd @ 11:30am.  I spent 60 minutes providing this consultation from 11:30am to 12:20pm. More than 50% of the time in this consultation was spent coordinating communication.   HISTORY OF PRESENT ILLNESS:  [name] is a 85 yo female  with h/o atrial fibrillation with rapid ventricular response (currently NSR, apixaban, diltiazem), breast cancer (remission), dehydration, HTN, chronic back pain, depression, and dementia. ER visit 10/30/2018 fro abd pain (CT cholelithiasis without cholecystitis). Recent hospitalization 7/21-7/30/2020 for nausea/abd pain d/t large hiatal hernia (? Pocketing of food in hernia pouch or even torsion of hernia). Surgery felt inadvisable.  CODE STATUS: DNR  PPS: 40%  HOSPICE ELIGIBILITY/DIAGNOSIS: TBD  PAST MEDICAL HISTORY:  Past Medical History:  Diagnosis Date  . Arthritis    low spine  . Breast cancer [facility]) 2013   right breast  . Breast cancer, right breast, recurrent 06/03/2011  . Cancer [facility]) 1989   breast right  . Chronic back pain   . Hypertension     SOCIAL HX:  Social History   Tobacco Use  . Smoking status: Former Smoker    Quit date: 05/11/1970  Years since quitting: 48.5  . Smokeless tobacco: Never Used  Substance Use Topics  . Alcohol use: No    ALLERGIES:  Allergies  Allergen Reactions  .  Arimidex [Anastrozole] Other (See Comments)    Neck cramping  . Fish Allergy Other (See Comments)    Patient prefers to NOT eat this  . Morphine And Related Nausea And Vomiting  . Shellfish Allergy Other (See Comments)    Patient prefers to NOT eat this  . Vicodin [Hydrocodone-Acetaminophen] Nausea Only    DOES tolerate Vicodin/Norco, however     PERTINENT MEDICATIONS:  Outpatient Encounter Medications as of 11/22/2018  Medication Sig  . acetaminophen (TYLENOL) 325 MG tablet Take 2 tablets (650 mg total) by mouth every 6 (six) hours as needed for mild pain, fever or headache.  . diltiazem (CARDIZEM CD) 240 MG 24 hr capsule Take 1 capsule (240 mg total) by mouth daily.  Marland Kitchen ELIQUIS 5 MG TABS tablet TAKE 1 TABLET BY MOUTH TWICE A DAY (Patient taking differently: Take 5 mg by mouth 2 (two) times daily. )  . gabapentin (NEURONTIN) 100 MG capsule Take 100 mg by mouth at bedtime.   Marland Kitchen HYDROcodone-acetaminophen (NORCO/VICODIN) 5-325 MG tablet Take 1 tablet by mouth every 8 (eight) hours as needed for pain.  Marland Kitchen losartan (COZAAR) 25 MG tablet TAKE 2 TABLETS BY MOUTH (50MG  TOTAL) EVERY DAY. PT NEEDS OFFICE VISIT  . Multiple Vitamins-Minerals (CENTRUM SILVER 50+WOMEN PO) Take 1 tablet by mouth daily with breakfast.  . nitroGLYCERIN (NITROSTAT) 0.4 MG SL tablet Place 1 tablet (0.4 mg total) under the tongue every 5 (five) minutes x 3 doses as needed for chest pain.  Marland Kitchen ondansetron (ZOFRAN) 4 MG tablet Take 1 tablet (4 mg total) by mouth every 8 (eight) hours as needed for nausea or vomiting.  . pantoprazole (PROTONIX) 40 MG tablet Take 1 tablet (40 mg total) by mouth daily.  . predniSONE (DELTASONE) 2.5 MG tablet Take 1.25 mg by mouth daily.   . sertraline (ZOLOFT) 50 MG tablet Take 50 mg by mouth every morning.   . vitamin C (ASCORBIC ACID) 500 MG tablet Take 500 mg by mouth daily.  Marland Kitchen VITAMIN E PO Take 1,000 Units by mouth daily.    No facility-administered encounter medications on file as of 11/22/2018.      PHYSICAL EXAM:   PE deferred d/t audio only nature of visit.  Julianne Handler, NP

## 2018-11-23 DIAGNOSIS — R278 Other lack of coordination: Secondary | ICD-10-CM | POA: Diagnosis not present

## 2018-11-23 DIAGNOSIS — M6281 Muscle weakness (generalized): Secondary | ICD-10-CM | POA: Diagnosis not present

## 2018-11-23 DIAGNOSIS — R2681 Unsteadiness on feet: Secondary | ICD-10-CM | POA: Diagnosis not present

## 2018-11-23 DIAGNOSIS — R296 Repeated falls: Secondary | ICD-10-CM | POA: Diagnosis not present

## 2018-11-23 DIAGNOSIS — R488 Other symbolic dysfunctions: Secondary | ICD-10-CM | POA: Diagnosis not present

## 2018-11-24 DIAGNOSIS — M6281 Muscle weakness (generalized): Secondary | ICD-10-CM | POA: Diagnosis not present

## 2018-11-24 DIAGNOSIS — R488 Other symbolic dysfunctions: Secondary | ICD-10-CM | POA: Diagnosis not present

## 2018-11-24 DIAGNOSIS — R278 Other lack of coordination: Secondary | ICD-10-CM | POA: Diagnosis not present

## 2018-11-24 DIAGNOSIS — R296 Repeated falls: Secondary | ICD-10-CM | POA: Diagnosis not present

## 2018-11-24 DIAGNOSIS — R2681 Unsteadiness on feet: Secondary | ICD-10-CM | POA: Diagnosis not present

## 2018-11-25 DIAGNOSIS — R296 Repeated falls: Secondary | ICD-10-CM | POA: Diagnosis not present

## 2018-11-25 DIAGNOSIS — R278 Other lack of coordination: Secondary | ICD-10-CM | POA: Diagnosis not present

## 2018-11-25 DIAGNOSIS — R488 Other symbolic dysfunctions: Secondary | ICD-10-CM | POA: Diagnosis not present

## 2018-11-25 DIAGNOSIS — M6281 Muscle weakness (generalized): Secondary | ICD-10-CM | POA: Diagnosis not present

## 2018-11-25 DIAGNOSIS — R2681 Unsteadiness on feet: Secondary | ICD-10-CM | POA: Diagnosis not present

## 2018-11-27 ENCOUNTER — Encounter: Payer: Self-pay | Admitting: Internal Medicine

## 2018-11-28 ENCOUNTER — Other Ambulatory Visit: Payer: Self-pay | Admitting: Cardiology

## 2018-11-28 DIAGNOSIS — R2681 Unsteadiness on feet: Secondary | ICD-10-CM | POA: Diagnosis not present

## 2018-11-28 DIAGNOSIS — M6281 Muscle weakness (generalized): Secondary | ICD-10-CM | POA: Diagnosis not present

## 2018-11-28 DIAGNOSIS — R296 Repeated falls: Secondary | ICD-10-CM | POA: Diagnosis not present

## 2018-11-28 NOTE — Telephone Encounter (Signed)
Spoke with daughter [name] and rescheduled the Palliative Consult for 11/22/18 @ 11:30 AM.

## 2018-11-29 DIAGNOSIS — R278 Other lack of coordination: Secondary | ICD-10-CM | POA: Diagnosis not present

## 2018-11-29 DIAGNOSIS — R296 Repeated falls: Secondary | ICD-10-CM | POA: Diagnosis not present

## 2018-11-29 DIAGNOSIS — R488 Other symbolic dysfunctions: Secondary | ICD-10-CM | POA: Diagnosis not present

## 2018-11-29 DIAGNOSIS — R2681 Unsteadiness on feet: Secondary | ICD-10-CM | POA: Diagnosis not present

## 2018-11-29 DIAGNOSIS — M6281 Muscle weakness (generalized): Secondary | ICD-10-CM | POA: Diagnosis not present

## 2018-11-30 DIAGNOSIS — R488 Other symbolic dysfunctions: Secondary | ICD-10-CM | POA: Diagnosis not present

## 2018-11-30 DIAGNOSIS — R2681 Unsteadiness on feet: Secondary | ICD-10-CM | POA: Diagnosis not present

## 2018-11-30 DIAGNOSIS — R278 Other lack of coordination: Secondary | ICD-10-CM | POA: Diagnosis not present

## 2018-11-30 DIAGNOSIS — M6281 Muscle weakness (generalized): Secondary | ICD-10-CM | POA: Diagnosis not present

## 2018-11-30 DIAGNOSIS — R296 Repeated falls: Secondary | ICD-10-CM | POA: Diagnosis not present

## 2018-12-01 DIAGNOSIS — R278 Other lack of coordination: Secondary | ICD-10-CM | POA: Diagnosis not present

## 2018-12-01 DIAGNOSIS — R488 Other symbolic dysfunctions: Secondary | ICD-10-CM | POA: Diagnosis not present

## 2018-12-01 DIAGNOSIS — M6281 Muscle weakness (generalized): Secondary | ICD-10-CM | POA: Diagnosis not present

## 2018-12-01 DIAGNOSIS — R2681 Unsteadiness on feet: Secondary | ICD-10-CM | POA: Diagnosis not present

## 2018-12-01 DIAGNOSIS — R296 Repeated falls: Secondary | ICD-10-CM | POA: Diagnosis not present

## 2018-12-02 DIAGNOSIS — M6281 Muscle weakness (generalized): Secondary | ICD-10-CM | POA: Diagnosis not present

## 2018-12-02 DIAGNOSIS — R488 Other symbolic dysfunctions: Secondary | ICD-10-CM | POA: Diagnosis not present

## 2018-12-02 DIAGNOSIS — R2681 Unsteadiness on feet: Secondary | ICD-10-CM | POA: Diagnosis not present

## 2018-12-02 DIAGNOSIS — R296 Repeated falls: Secondary | ICD-10-CM | POA: Diagnosis not present

## 2018-12-02 DIAGNOSIS — R278 Other lack of coordination: Secondary | ICD-10-CM | POA: Diagnosis not present

## 2018-12-05 ENCOUNTER — Other Ambulatory Visit: Payer: Self-pay | Admitting: Cardiovascular Disease

## 2018-12-05 DIAGNOSIS — I4891 Unspecified atrial fibrillation: Secondary | ICD-10-CM

## 2018-12-05 DIAGNOSIS — Z7901 Long term (current) use of anticoagulants: Secondary | ICD-10-CM

## 2018-12-05 DIAGNOSIS — Z853 Personal history of malignant neoplasm of breast: Secondary | ICD-10-CM

## 2018-12-05 DIAGNOSIS — I10 Essential (primary) hypertension: Secondary | ICD-10-CM

## 2018-12-05 DIAGNOSIS — R296 Repeated falls: Secondary | ICD-10-CM | POA: Diagnosis not present

## 2018-12-05 DIAGNOSIS — M6281 Muscle weakness (generalized): Secondary | ICD-10-CM | POA: Diagnosis not present

## 2018-12-05 DIAGNOSIS — R2681 Unsteadiness on feet: Secondary | ICD-10-CM | POA: Diagnosis not present

## 2018-12-06 DIAGNOSIS — M6281 Muscle weakness (generalized): Secondary | ICD-10-CM | POA: Diagnosis not present

## 2018-12-06 DIAGNOSIS — R488 Other symbolic dysfunctions: Secondary | ICD-10-CM | POA: Diagnosis not present

## 2018-12-06 DIAGNOSIS — R2681 Unsteadiness on feet: Secondary | ICD-10-CM | POA: Diagnosis not present

## 2018-12-06 DIAGNOSIS — R296 Repeated falls: Secondary | ICD-10-CM | POA: Diagnosis not present

## 2018-12-06 DIAGNOSIS — R278 Other lack of coordination: Secondary | ICD-10-CM | POA: Diagnosis not present

## 2018-12-07 DIAGNOSIS — R296 Repeated falls: Secondary | ICD-10-CM | POA: Diagnosis not present

## 2018-12-07 DIAGNOSIS — M6281 Muscle weakness (generalized): Secondary | ICD-10-CM | POA: Diagnosis not present

## 2018-12-07 DIAGNOSIS — R488 Other symbolic dysfunctions: Secondary | ICD-10-CM | POA: Diagnosis not present

## 2018-12-07 DIAGNOSIS — R278 Other lack of coordination: Secondary | ICD-10-CM | POA: Diagnosis not present

## 2018-12-07 DIAGNOSIS — R2681 Unsteadiness on feet: Secondary | ICD-10-CM | POA: Diagnosis not present

## 2018-12-08 DIAGNOSIS — M6281 Muscle weakness (generalized): Secondary | ICD-10-CM | POA: Diagnosis not present

## 2018-12-08 DIAGNOSIS — R296 Repeated falls: Secondary | ICD-10-CM | POA: Diagnosis not present

## 2018-12-08 DIAGNOSIS — R278 Other lack of coordination: Secondary | ICD-10-CM | POA: Diagnosis not present

## 2018-12-08 DIAGNOSIS — R488 Other symbolic dysfunctions: Secondary | ICD-10-CM | POA: Diagnosis not present

## 2018-12-08 DIAGNOSIS — R2681 Unsteadiness on feet: Secondary | ICD-10-CM | POA: Diagnosis not present

## 2018-12-09 DIAGNOSIS — R488 Other symbolic dysfunctions: Secondary | ICD-10-CM | POA: Diagnosis not present

## 2018-12-09 DIAGNOSIS — R296 Repeated falls: Secondary | ICD-10-CM | POA: Diagnosis not present

## 2018-12-09 DIAGNOSIS — M6281 Muscle weakness (generalized): Secondary | ICD-10-CM | POA: Diagnosis not present

## 2018-12-09 DIAGNOSIS — R278 Other lack of coordination: Secondary | ICD-10-CM | POA: Diagnosis not present

## 2018-12-09 DIAGNOSIS — R2681 Unsteadiness on feet: Secondary | ICD-10-CM | POA: Diagnosis not present

## 2018-12-12 DIAGNOSIS — R2681 Unsteadiness on feet: Secondary | ICD-10-CM | POA: Diagnosis not present

## 2018-12-12 DIAGNOSIS — R296 Repeated falls: Secondary | ICD-10-CM | POA: Diagnosis not present

## 2018-12-12 DIAGNOSIS — M6281 Muscle weakness (generalized): Secondary | ICD-10-CM | POA: Diagnosis not present

## 2018-12-13 DIAGNOSIS — I10 Essential (primary) hypertension: Secondary | ICD-10-CM | POA: Diagnosis not present

## 2018-12-13 DIAGNOSIS — K449 Diaphragmatic hernia without obstruction or gangrene: Secondary | ICD-10-CM | POA: Diagnosis not present

## 2018-12-13 DIAGNOSIS — E782 Mixed hyperlipidemia: Secondary | ICD-10-CM | POA: Diagnosis not present

## 2018-12-13 DIAGNOSIS — M1990 Unspecified osteoarthritis, unspecified site: Secondary | ICD-10-CM | POA: Diagnosis not present

## 2018-12-13 DIAGNOSIS — M549 Dorsalgia, unspecified: Secondary | ICD-10-CM | POA: Diagnosis not present

## 2018-12-13 DIAGNOSIS — R278 Other lack of coordination: Secondary | ICD-10-CM | POA: Diagnosis not present

## 2018-12-13 DIAGNOSIS — M6281 Muscle weakness (generalized): Secondary | ICD-10-CM | POA: Diagnosis not present

## 2018-12-13 DIAGNOSIS — I480 Paroxysmal atrial fibrillation: Secondary | ICD-10-CM | POA: Diagnosis not present

## 2018-12-13 DIAGNOSIS — R296 Repeated falls: Secondary | ICD-10-CM | POA: Diagnosis not present

## 2018-12-13 DIAGNOSIS — Z7189 Other specified counseling: Secondary | ICD-10-CM | POA: Diagnosis not present

## 2018-12-13 DIAGNOSIS — R2681 Unsteadiness on feet: Secondary | ICD-10-CM | POA: Diagnosis not present

## 2018-12-13 DIAGNOSIS — R488 Other symbolic dysfunctions: Secondary | ICD-10-CM | POA: Diagnosis not present

## 2018-12-13 DIAGNOSIS — F0390 Unspecified dementia without behavioral disturbance: Secondary | ICD-10-CM | POA: Diagnosis not present

## 2018-12-14 DIAGNOSIS — R296 Repeated falls: Secondary | ICD-10-CM | POA: Diagnosis not present

## 2018-12-14 DIAGNOSIS — R278 Other lack of coordination: Secondary | ICD-10-CM | POA: Diagnosis not present

## 2018-12-14 DIAGNOSIS — R488 Other symbolic dysfunctions: Secondary | ICD-10-CM | POA: Diagnosis not present

## 2018-12-14 DIAGNOSIS — M6281 Muscle weakness (generalized): Secondary | ICD-10-CM | POA: Diagnosis not present

## 2018-12-14 DIAGNOSIS — R2681 Unsteadiness on feet: Secondary | ICD-10-CM | POA: Diagnosis not present

## 2018-12-15 DIAGNOSIS — R296 Repeated falls: Secondary | ICD-10-CM | POA: Diagnosis not present

## 2018-12-15 DIAGNOSIS — R2681 Unsteadiness on feet: Secondary | ICD-10-CM | POA: Diagnosis not present

## 2018-12-15 DIAGNOSIS — R278 Other lack of coordination: Secondary | ICD-10-CM | POA: Diagnosis not present

## 2018-12-15 DIAGNOSIS — M6281 Muscle weakness (generalized): Secondary | ICD-10-CM | POA: Diagnosis not present

## 2018-12-15 DIAGNOSIS — R488 Other symbolic dysfunctions: Secondary | ICD-10-CM | POA: Diagnosis not present

## 2018-12-16 DIAGNOSIS — R296 Repeated falls: Secondary | ICD-10-CM | POA: Diagnosis not present

## 2018-12-16 DIAGNOSIS — R488 Other symbolic dysfunctions: Secondary | ICD-10-CM | POA: Diagnosis not present

## 2018-12-16 DIAGNOSIS — R2681 Unsteadiness on feet: Secondary | ICD-10-CM | POA: Diagnosis not present

## 2018-12-16 DIAGNOSIS — M6281 Muscle weakness (generalized): Secondary | ICD-10-CM | POA: Diagnosis not present

## 2018-12-16 DIAGNOSIS — R278 Other lack of coordination: Secondary | ICD-10-CM | POA: Diagnosis not present

## 2018-12-19 DIAGNOSIS — R296 Repeated falls: Secondary | ICD-10-CM | POA: Diagnosis not present

## 2018-12-19 DIAGNOSIS — M6281 Muscle weakness (generalized): Secondary | ICD-10-CM | POA: Diagnosis not present

## 2018-12-19 DIAGNOSIS — R2681 Unsteadiness on feet: Secondary | ICD-10-CM | POA: Diagnosis not present

## 2018-12-20 DIAGNOSIS — R296 Repeated falls: Secondary | ICD-10-CM | POA: Diagnosis not present

## 2018-12-20 DIAGNOSIS — R2681 Unsteadiness on feet: Secondary | ICD-10-CM | POA: Diagnosis not present

## 2018-12-20 DIAGNOSIS — M6281 Muscle weakness (generalized): Secondary | ICD-10-CM | POA: Diagnosis not present

## 2018-12-21 DIAGNOSIS — R296 Repeated falls: Secondary | ICD-10-CM | POA: Diagnosis not present

## 2018-12-21 DIAGNOSIS — R278 Other lack of coordination: Secondary | ICD-10-CM | POA: Diagnosis not present

## 2018-12-21 DIAGNOSIS — R488 Other symbolic dysfunctions: Secondary | ICD-10-CM | POA: Diagnosis not present

## 2018-12-21 DIAGNOSIS — M6281 Muscle weakness (generalized): Secondary | ICD-10-CM | POA: Diagnosis not present

## 2018-12-22 DIAGNOSIS — R2681 Unsteadiness on feet: Secondary | ICD-10-CM | POA: Diagnosis not present

## 2018-12-22 DIAGNOSIS — R278 Other lack of coordination: Secondary | ICD-10-CM | POA: Diagnosis not present

## 2018-12-22 DIAGNOSIS — R296 Repeated falls: Secondary | ICD-10-CM | POA: Diagnosis not present

## 2018-12-22 DIAGNOSIS — M6281 Muscle weakness (generalized): Secondary | ICD-10-CM | POA: Diagnosis not present

## 2018-12-22 DIAGNOSIS — R488 Other symbolic dysfunctions: Secondary | ICD-10-CM | POA: Diagnosis not present

## 2018-12-23 DIAGNOSIS — R488 Other symbolic dysfunctions: Secondary | ICD-10-CM | POA: Diagnosis not present

## 2018-12-23 DIAGNOSIS — R296 Repeated falls: Secondary | ICD-10-CM | POA: Diagnosis not present

## 2018-12-23 DIAGNOSIS — R2681 Unsteadiness on feet: Secondary | ICD-10-CM | POA: Diagnosis not present

## 2018-12-23 DIAGNOSIS — R278 Other lack of coordination: Secondary | ICD-10-CM | POA: Diagnosis not present

## 2018-12-23 DIAGNOSIS — M6281 Muscle weakness (generalized): Secondary | ICD-10-CM | POA: Diagnosis not present

## 2018-12-26 ENCOUNTER — Other Ambulatory Visit: Payer: Self-pay | Admitting: Cardiovascular Disease

## 2018-12-26 DIAGNOSIS — Z7901 Long term (current) use of anticoagulants: Secondary | ICD-10-CM

## 2018-12-26 DIAGNOSIS — I4891 Unspecified atrial fibrillation: Secondary | ICD-10-CM

## 2018-12-26 DIAGNOSIS — R296 Repeated falls: Secondary | ICD-10-CM | POA: Diagnosis not present

## 2018-12-26 DIAGNOSIS — M6281 Muscle weakness (generalized): Secondary | ICD-10-CM | POA: Diagnosis not present

## 2018-12-26 DIAGNOSIS — Z853 Personal history of malignant neoplasm of breast: Secondary | ICD-10-CM

## 2018-12-26 DIAGNOSIS — I10 Essential (primary) hypertension: Secondary | ICD-10-CM

## 2018-12-26 DIAGNOSIS — R2681 Unsteadiness on feet: Secondary | ICD-10-CM | POA: Diagnosis not present

## 2018-12-27 DIAGNOSIS — M6281 Muscle weakness (generalized): Secondary | ICD-10-CM | POA: Diagnosis not present

## 2018-12-27 DIAGNOSIS — R278 Other lack of coordination: Secondary | ICD-10-CM | POA: Diagnosis not present

## 2018-12-27 DIAGNOSIS — R296 Repeated falls: Secondary | ICD-10-CM | POA: Diagnosis not present

## 2018-12-27 DIAGNOSIS — R488 Other symbolic dysfunctions: Secondary | ICD-10-CM | POA: Diagnosis not present

## 2018-12-27 DIAGNOSIS — R2681 Unsteadiness on feet: Secondary | ICD-10-CM | POA: Diagnosis not present

## 2018-12-28 DIAGNOSIS — R2681 Unsteadiness on feet: Secondary | ICD-10-CM | POA: Diagnosis not present

## 2018-12-28 DIAGNOSIS — R296 Repeated falls: Secondary | ICD-10-CM | POA: Diagnosis not present

## 2018-12-28 DIAGNOSIS — M6281 Muscle weakness (generalized): Secondary | ICD-10-CM | POA: Diagnosis not present

## 2018-12-29 DIAGNOSIS — R488 Other symbolic dysfunctions: Secondary | ICD-10-CM | POA: Diagnosis not present

## 2018-12-29 DIAGNOSIS — R296 Repeated falls: Secondary | ICD-10-CM | POA: Diagnosis not present

## 2018-12-29 DIAGNOSIS — R278 Other lack of coordination: Secondary | ICD-10-CM | POA: Diagnosis not present

## 2018-12-29 DIAGNOSIS — M6281 Muscle weakness (generalized): Secondary | ICD-10-CM | POA: Diagnosis not present

## 2018-12-30 DIAGNOSIS — R2681 Unsteadiness on feet: Secondary | ICD-10-CM | POA: Diagnosis not present

## 2018-12-30 DIAGNOSIS — R278 Other lack of coordination: Secondary | ICD-10-CM | POA: Diagnosis not present

## 2018-12-30 DIAGNOSIS — R296 Repeated falls: Secondary | ICD-10-CM | POA: Diagnosis not present

## 2018-12-30 DIAGNOSIS — R488 Other symbolic dysfunctions: Secondary | ICD-10-CM | POA: Diagnosis not present

## 2018-12-30 DIAGNOSIS — M6281 Muscle weakness (generalized): Secondary | ICD-10-CM | POA: Diagnosis not present

## 2019-01-02 DIAGNOSIS — R296 Repeated falls: Secondary | ICD-10-CM | POA: Diagnosis not present

## 2019-01-02 DIAGNOSIS — M6281 Muscle weakness (generalized): Secondary | ICD-10-CM | POA: Diagnosis not present

## 2019-01-02 DIAGNOSIS — R488 Other symbolic dysfunctions: Secondary | ICD-10-CM | POA: Diagnosis not present

## 2019-01-02 DIAGNOSIS — R2681 Unsteadiness on feet: Secondary | ICD-10-CM | POA: Diagnosis not present

## 2019-01-02 DIAGNOSIS — R278 Other lack of coordination: Secondary | ICD-10-CM | POA: Diagnosis not present

## 2019-01-03 ENCOUNTER — Other Ambulatory Visit: Payer: Medicare HMO | Admitting: Internal Medicine

## 2019-01-03 ENCOUNTER — Encounter: Payer: Self-pay | Admitting: Internal Medicine

## 2019-01-03 ENCOUNTER — Other Ambulatory Visit: Payer: Self-pay

## 2019-01-03 DIAGNOSIS — M6281 Muscle weakness (generalized): Secondary | ICD-10-CM | POA: Diagnosis not present

## 2019-01-03 DIAGNOSIS — R2681 Unsteadiness on feet: Secondary | ICD-10-CM | POA: Diagnosis not present

## 2019-01-03 DIAGNOSIS — R488 Other symbolic dysfunctions: Secondary | ICD-10-CM | POA: Diagnosis not present

## 2019-01-03 DIAGNOSIS — Z515 Encounter for palliative care: Secondary | ICD-10-CM

## 2019-01-03 DIAGNOSIS — R278 Other lack of coordination: Secondary | ICD-10-CM | POA: Diagnosis not present

## 2019-01-03 DIAGNOSIS — R296 Repeated falls: Secondary | ICD-10-CM | POA: Diagnosis not present

## 2019-01-03 NOTE — Progress Notes (Signed)
Sept 22nd, 2020 [facility] Palliative Care Consult Note Telephone: [phone]  Fax: [phone]   Due to the current COVID-19 infection/crises, the family prefer, and have given their verbal consent for, a provider visit via telemedicine. HIPPA policies of confidentially were discussed. Video-audio (telehealth) contact was unable to be done due technical barriers from the patient's side.   PATIENT NAME: [name] DOB: [birth year] MRN: [id number] [name], [place] (independent living facility) [place], [place] [address]: mail DNR, MOST, email my email address.  (H) [phone], (M) [phone]   PRIMARY CARE PROVIDER:   Mayra Neer, Gay   REFERRING PROVIDER:  Mayra Neer, Gay [address] [place], [place] [address]   RESPONSIBLE PARTY: (daughter) [name] [phone], ([phone]. Email address:  [email or web address].  [name] another dtr involved in care.   ASSESSMENT / RECOMMENDATIONS:  1.Advance Care Planning: A. Directives: DNR in home  B. Goals of Care:  -Keep out of Nursing Home. Keep patient safe and happy. -Conservative management. No plans for feeding tube or surgery. If patient worsens in the future, consideration would be given to hospice.   2. Cognitive / Functional status:  Daughter [name] reports patient with slowly progressive signs of dementia/forgetfulness. Poor short/long term memory. However, feels that her mom has a happy affect. Occasional agitation well managed with reassurance and redirection. Continues to work with facility PT Secondary school teacher) M-F; daughter notes patient is a little more unsteady/stumbles in her use with her walker. Usually transfers independently. Patient has had one fall over the last month or so, when she tripped getting up during the night. Family has purchased a bed alarm which is working well.  Current weight is 157lbs.She had lost about 40 lbs over the earlier part of this year, but has been maintaining a stable weight these last 3 months. At height or 5" her BMI is 31 kg/m2. Daughter reports a good appetite and intake. Arthritic type of pain in neck r/t osteoporosis, managed with hydrocodone few times a week (am).  3. Family Supports: IL at [facility] with 24 hr assistance; coverage either via private pay sitters or by daughters. One of daughters [name]) covers Fri afternoon to [place] am, bringing patient to her home. [name]'s husband also with early dementia (still driving), and mother-in-law died of dementia. [name] copes by taking "one day at a time".   4. Follow up Palliative Care Visit: Tentatively scheduled for Nov 24th @ 11:30am. Call evening before to verify.   I spent 30 minutes providing this consultation from 11:30am to 12pm. More than 50% of the time in this consultation was spent coordinating communication.    HISTORY OF PRESENT ILLNESS:  [name] is a 85 yo female with h/o atrial fibrillation with rapid ventricular response (currently NSR, apixaban, diltiazem), breast cancer (remission), dehydration, HTN, chronic back pain, depression, and dementia. ER visit 10/30/2018 fro abd pain (CT cholelithiasis without cholecystitis). Recent hospitalization 7/21-7/30/2020 for nausea/abd pain d/t large hiatal hernia (? Pocketing of food in hernia pouch or even torsion of hernia). Surgery felt inadvisable. This is a f/u Palliative Care visit form Aug 11th, 2020.   CODE STATUS: DNR   PPS: 40%   HOSPICE ELIGIBILITY/DIAGNOSIS: TBD  PAST MEDICAL HISTORY:  Past Medical History:  Diagnosis Date  . Arthritis    low spine  . Breast cancer [facility]) 2013   right breast  . Breast cancer, right  breast, recurrent 06/03/2011  . Cancer [facility]) 1989   breast right  . Chronic back pain   . Hypertension     SOCIAL HX:  Social History   Tobacco Use  . Smoking status: Former Smoker     Quit date: 05/11/1970    Years since quitting: 48.6  . Smokeless tobacco: Never Used  Substance Use Topics  . Alcohol use: No    ALLERGIES:  Allergies  Allergen Reactions  . Arimidex [Anastrozole] Other (See Comments)    Neck cramping  . Fish Allergy Other (See Comments)    Patient prefers to NOT eat this  . Morphine And Related Nausea And Vomiting  . Shellfish Allergy Other (See Comments)    Patient prefers to NOT eat this  . Vicodin [Hydrocodone-Acetaminophen] Nausea Only    DOES tolerate Vicodin/Norco, however     PERTINENT MEDICATIONS:  Outpatient Encounter Medications as of 01/03/2019  Medication Sig  . acetaminophen (TYLENOL) 325 MG tablet Take 2 tablets (650 mg total) by mouth every 6 (six) hours as needed for mild pain, fever or headache.  . diltiazem (CARDIZEM CD) 240 MG 24 hr capsule Take 1 capsule (240 mg total) by mouth daily.  Marland Kitchen ELIQUIS 5 MG TABS tablet TAKE 1 TABLET BY MOUTH TWICE A DAY (Patient taking differently: Take 5 mg by mouth 2 (two) times daily. )  . gabapentin (NEURONTIN) 100 MG capsule Take 100 mg by mouth at bedtime.   Marland Kitchen HYDROcodone-acetaminophen (NORCO/VICODIN) 5-325 MG tablet Take 1 tablet by mouth every 8 (eight) hours as needed for pain.  Marland Kitchen losartan (COZAAR) 25 MG tablet Take 2 tablets (50 mg total) by mouth daily. NEED OV.  . Multiple Vitamins-Minerals (CENTRUM SILVER 50+WOMEN PO) Take 1 tablet by mouth daily with breakfast.  . nitroGLYCERIN (NITROSTAT) 0.4 MG SL tablet Place 1 tablet (0.4 mg total) under the tongue every 5 (five) minutes x 3 doses as needed for chest pain. (Patient not taking: Reported on 11/27/2018)  . ondansetron (ZOFRAN) 4 MG tablet Take 1 tablet (4 mg total) by mouth every 8 (eight) hours as needed for nausea or vomiting.  . pantoprazole (PROTONIX) 40 MG tablet Take 1 tablet (40 mg total) by mouth daily.  . predniSONE (DELTASONE) 2.5 MG tablet Take 1.25 mg by mouth daily.   . sertraline (ZOLOFT) 50 MG tablet Take 50 mg by mouth every  morning.   . vitamin C (ASCORBIC ACID) 500 MG tablet Take 500 mg by mouth daily.  Marland Kitchen VITAMIN E PO Take 1,000 Units by mouth daily.    No facility-administered encounter medications on file as of 01/03/2019.     PHYSICAL EXAM:   PE deferred d/t telehealth/telephonic nature of visit. Primary source of information was daughter [name].  Julianne Handler, NP

## 2019-01-04 DIAGNOSIS — M6281 Muscle weakness (generalized): Secondary | ICD-10-CM | POA: Diagnosis not present

## 2019-01-04 DIAGNOSIS — R296 Repeated falls: Secondary | ICD-10-CM | POA: Diagnosis not present

## 2019-01-04 DIAGNOSIS — R488 Other symbolic dysfunctions: Secondary | ICD-10-CM | POA: Diagnosis not present

## 2019-01-04 DIAGNOSIS — R278 Other lack of coordination: Secondary | ICD-10-CM | POA: Diagnosis not present

## 2019-01-04 DIAGNOSIS — R2681 Unsteadiness on feet: Secondary | ICD-10-CM | POA: Diagnosis not present

## 2019-01-06 DIAGNOSIS — R2681 Unsteadiness on feet: Secondary | ICD-10-CM | POA: Diagnosis not present

## 2019-01-06 DIAGNOSIS — M6281 Muscle weakness (generalized): Secondary | ICD-10-CM | POA: Diagnosis not present

## 2019-01-06 DIAGNOSIS — R488 Other symbolic dysfunctions: Secondary | ICD-10-CM | POA: Diagnosis not present

## 2019-01-06 DIAGNOSIS — R296 Repeated falls: Secondary | ICD-10-CM | POA: Diagnosis not present

## 2019-01-06 DIAGNOSIS — R278 Other lack of coordination: Secondary | ICD-10-CM | POA: Diagnosis not present

## 2019-01-09 DIAGNOSIS — R2681 Unsteadiness on feet: Secondary | ICD-10-CM | POA: Diagnosis not present

## 2019-01-09 DIAGNOSIS — R278 Other lack of coordination: Secondary | ICD-10-CM | POA: Diagnosis not present

## 2019-01-09 DIAGNOSIS — R296 Repeated falls: Secondary | ICD-10-CM | POA: Diagnosis not present

## 2019-01-09 DIAGNOSIS — R488 Other symbolic dysfunctions: Secondary | ICD-10-CM | POA: Diagnosis not present

## 2019-01-09 DIAGNOSIS — M6281 Muscle weakness (generalized): Secondary | ICD-10-CM | POA: Diagnosis not present

## 2019-01-10 DIAGNOSIS — M6281 Muscle weakness (generalized): Secondary | ICD-10-CM | POA: Diagnosis not present

## 2019-01-10 DIAGNOSIS — R296 Repeated falls: Secondary | ICD-10-CM | POA: Diagnosis not present

## 2019-01-10 DIAGNOSIS — R278 Other lack of coordination: Secondary | ICD-10-CM | POA: Diagnosis not present

## 2019-01-10 DIAGNOSIS — R2681 Unsteadiness on feet: Secondary | ICD-10-CM | POA: Diagnosis not present

## 2019-01-10 DIAGNOSIS — R488 Other symbolic dysfunctions: Secondary | ICD-10-CM | POA: Diagnosis not present

## 2019-01-11 DIAGNOSIS — R296 Repeated falls: Secondary | ICD-10-CM | POA: Diagnosis not present

## 2019-01-11 DIAGNOSIS — R488 Other symbolic dysfunctions: Secondary | ICD-10-CM | POA: Diagnosis not present

## 2019-01-11 DIAGNOSIS — R278 Other lack of coordination: Secondary | ICD-10-CM | POA: Diagnosis not present

## 2019-01-11 DIAGNOSIS — R2681 Unsteadiness on feet: Secondary | ICD-10-CM | POA: Diagnosis not present

## 2019-01-11 DIAGNOSIS — M6281 Muscle weakness (generalized): Secondary | ICD-10-CM | POA: Diagnosis not present

## 2019-01-12 DIAGNOSIS — R278 Other lack of coordination: Secondary | ICD-10-CM | POA: Diagnosis not present

## 2019-01-12 DIAGNOSIS — R2681 Unsteadiness on feet: Secondary | ICD-10-CM | POA: Diagnosis not present

## 2019-01-12 DIAGNOSIS — R488 Other symbolic dysfunctions: Secondary | ICD-10-CM | POA: Diagnosis not present

## 2019-01-12 DIAGNOSIS — M6281 Muscle weakness (generalized): Secondary | ICD-10-CM | POA: Diagnosis not present

## 2019-01-12 DIAGNOSIS — R296 Repeated falls: Secondary | ICD-10-CM | POA: Diagnosis not present

## 2019-01-17 DIAGNOSIS — R296 Repeated falls: Secondary | ICD-10-CM | POA: Diagnosis not present

## 2019-01-17 DIAGNOSIS — R2681 Unsteadiness on feet: Secondary | ICD-10-CM | POA: Diagnosis not present

## 2019-01-17 DIAGNOSIS — M6281 Muscle weakness (generalized): Secondary | ICD-10-CM | POA: Diagnosis not present

## 2019-01-17 DIAGNOSIS — R278 Other lack of coordination: Secondary | ICD-10-CM | POA: Diagnosis not present

## 2019-01-17 DIAGNOSIS — R488 Other symbolic dysfunctions: Secondary | ICD-10-CM | POA: Diagnosis not present

## 2019-01-18 DIAGNOSIS — R296 Repeated falls: Secondary | ICD-10-CM | POA: Diagnosis not present

## 2019-01-18 DIAGNOSIS — R2681 Unsteadiness on feet: Secondary | ICD-10-CM | POA: Diagnosis not present

## 2019-01-18 DIAGNOSIS — M6281 Muscle weakness (generalized): Secondary | ICD-10-CM | POA: Diagnosis not present

## 2019-01-18 DIAGNOSIS — R488 Other symbolic dysfunctions: Secondary | ICD-10-CM | POA: Diagnosis not present

## 2019-01-18 DIAGNOSIS — R278 Other lack of coordination: Secondary | ICD-10-CM | POA: Diagnosis not present

## 2019-01-19 DIAGNOSIS — R296 Repeated falls: Secondary | ICD-10-CM | POA: Diagnosis not present

## 2019-01-19 DIAGNOSIS — R488 Other symbolic dysfunctions: Secondary | ICD-10-CM | POA: Diagnosis not present

## 2019-01-19 DIAGNOSIS — M6281 Muscle weakness (generalized): Secondary | ICD-10-CM | POA: Diagnosis not present

## 2019-01-19 DIAGNOSIS — R278 Other lack of coordination: Secondary | ICD-10-CM | POA: Diagnosis not present

## 2019-01-19 DIAGNOSIS — R2681 Unsteadiness on feet: Secondary | ICD-10-CM | POA: Diagnosis not present

## 2019-01-20 DIAGNOSIS — R488 Other symbolic dysfunctions: Secondary | ICD-10-CM | POA: Diagnosis not present

## 2019-01-20 DIAGNOSIS — R278 Other lack of coordination: Secondary | ICD-10-CM | POA: Diagnosis not present

## 2019-01-20 DIAGNOSIS — R296 Repeated falls: Secondary | ICD-10-CM | POA: Diagnosis not present

## 2019-01-20 DIAGNOSIS — M6281 Muscle weakness (generalized): Secondary | ICD-10-CM | POA: Diagnosis not present

## 2019-01-23 DIAGNOSIS — R278 Other lack of coordination: Secondary | ICD-10-CM | POA: Diagnosis not present

## 2019-01-23 DIAGNOSIS — M6281 Muscle weakness (generalized): Secondary | ICD-10-CM | POA: Diagnosis not present

## 2019-01-23 DIAGNOSIS — R296 Repeated falls: Secondary | ICD-10-CM | POA: Diagnosis not present

## 2019-01-23 DIAGNOSIS — R488 Other symbolic dysfunctions: Secondary | ICD-10-CM | POA: Diagnosis not present

## 2019-03-03 ENCOUNTER — Other Ambulatory Visit: Payer: Self-pay | Admitting: Cardiovascular Disease

## 2019-03-03 DIAGNOSIS — Z853 Personal history of malignant neoplasm of breast: Secondary | ICD-10-CM

## 2019-03-03 DIAGNOSIS — Z7901 Long term (current) use of anticoagulants: Secondary | ICD-10-CM

## 2019-03-03 DIAGNOSIS — I4891 Unspecified atrial fibrillation: Secondary | ICD-10-CM

## 2019-03-03 DIAGNOSIS — I10 Essential (primary) hypertension: Secondary | ICD-10-CM

## 2019-03-07 ENCOUNTER — Other Ambulatory Visit: Payer: Self-pay

## 2019-03-07 ENCOUNTER — Other Ambulatory Visit: Payer: Medicare HMO | Admitting: Internal Medicine

## 2019-03-07 ENCOUNTER — Telehealth: Payer: Self-pay | Admitting: Internal Medicine

## 2019-03-07 DIAGNOSIS — Z515 Encounter for palliative care: Secondary | ICD-10-CM

## 2019-03-13 NOTE — Telephone Encounter (Signed)
Note started in error; disregard.

## 2019-03-13 NOTE — Progress Notes (Signed)
Patient not seen. Disregard this entry. Violeta Gelinas NP-C [place] [phone]

## 2019-05-29 DIAGNOSIS — E1169 Type 2 diabetes mellitus with other specified complication: Secondary | ICD-10-CM | POA: Diagnosis not present

## 2019-05-29 DIAGNOSIS — H6120 Impacted cerumen, unspecified ear: Secondary | ICD-10-CM | POA: Diagnosis not present

## 2019-05-29 DIAGNOSIS — R600 Localized edema: Secondary | ICD-10-CM | POA: Diagnosis not present

## 2019-05-29 DIAGNOSIS — H61899 Other specified disorders of external ear, unspecified ear: Secondary | ICD-10-CM | POA: Diagnosis not present

## 2019-05-29 DIAGNOSIS — F0390 Unspecified dementia without behavioral disturbance: Secondary | ICD-10-CM | POA: Diagnosis not present

## 2019-05-29 DIAGNOSIS — I4891 Unspecified atrial fibrillation: Secondary | ICD-10-CM | POA: Diagnosis not present

## 2019-05-29 DIAGNOSIS — M549 Dorsalgia, unspecified: Secondary | ICD-10-CM | POA: Diagnosis not present

## 2019-05-29 DIAGNOSIS — R410 Disorientation, unspecified: Secondary | ICD-10-CM | POA: Diagnosis not present

## 2019-05-29 DIAGNOSIS — F411 Generalized anxiety disorder: Secondary | ICD-10-CM | POA: Diagnosis not present

## 2019-06-02 ENCOUNTER — Telehealth: Payer: Self-pay | Admitting: Internal Medicine

## 2019-06-02 NOTE — Telephone Encounter (Signed)
Spoke with patient's daughter [name] about scheduling a Palliative f/u visit and she was in agreement with this.  I have scheduled an In-person visit for 06/09/19 @ 9 AM.

## 2019-06-09 ENCOUNTER — Encounter: Payer: Self-pay | Admitting: Internal Medicine

## 2019-06-09 ENCOUNTER — Other Ambulatory Visit: Payer: Self-pay

## 2019-06-09 ENCOUNTER — Other Ambulatory Visit: Payer: Medicare HMO | Admitting: Internal Medicine

## 2019-06-09 DIAGNOSIS — Z515 Encounter for palliative care: Secondary | ICD-10-CM

## 2019-06-09 DIAGNOSIS — Z7189 Other specified counseling: Secondary | ICD-10-CM | POA: Diagnosis not present

## 2019-06-09 NOTE — Progress Notes (Signed)
Feb 26th, 2021 [facility] Palliative Care Consult Note Telephone: [phone]  Fax: [phone]    PATIENT NAME: Makayla Marze) Gay  DOB: 07-Aug-1925 MRN: [id number] [address] [place] [address]: mail DNR, MOST, email my email address.  (H) [id number], (M) [phone]   PRIMARY CARE PROVIDER:   Mayra Neer, MD   REFERRING PROVIDER:  Mayra Neer, MD [address] [place], [place] [address]   RESPONSIBLE PARTY: (daughter) Guadelupe Sabin [phone], ([phone]. Email address:  [email or web address].   ASSESSMENT / RECOMMENDATIONS:  1.Advance Care Planning:  A. Directives: DNR in home and in [place]. I completed a second form for her to bring with her should the patient travel outside the home. We discussed letting her mom's care givers know where the form is kept. I completed and uploaded patient's MOST form into [facility]. Original left in the home.    B. Goals of Care:  -Keep out of Nursing Home. Keep patient safe and happy. -Conservative management. No plans for feeding tube or surgery. If patient worsens in the future, consideration would be given to hospice.    2. Cognitive / Functional status: FAST 6b. Patient continues with mild confusion which waxes and wanes. Happy affect. Sometime doesn't remember that her husband is deceased. She is mostly independent in transfers, and motors about in her rollator walker. She is independent in toileting and personal hygiene; needs assist with showering. Bed alarm at night. She can select her own clothing and dress independently. No longer working with PT ("graduated" while at [employer]). Occasional constipation managed with prn Miralax. Occasional incontinence of urine (adult pull ups). Eating well. Arthritic type of pain in neck r/t osteoporosis, managed with hydrocodone few times a week (am). I didn't inquire regarding her weight, but she appears to have a  generous BMI. Weight 5 months earlier was stable at 157lbs. At height or 5" her BMI was 31 kg/m2. Daughter reports a good appetite and intake.   3. Family Supports: Patient is widowed; sometimes forgets husband is deceased. At those times [name] will tell patient that spouse was "deployed". Patient previously residing at [facility], moved to stay with her daughter [name] d/t lack of social activities at the facility, d/t COVID. [name] has felt this was a positive move for her mom; feels patient has improved a bit both functionally and cognitively.  [name]'s husband also with early dementia, and [name]'s mother-in-law died of dementia. [name] works full time but is hoping to change to part time around June. There is 24/7 coverage within the home to watch both the patient, and [name]'s spouse, either covered by [name] or private paid care givers (M-F 9a-5p). [name] has a sister [name] ([place]); has health problems and is unable to assist. [name] has 2 brothers, one of whom predeceased patient's husband, and I believe the other is disabled. [name] copes with her job and caregiving responsibilities by "taking one day at a time". She seems to be maintain her good spirits. Enjoys working as it provides her with a change of pace/get out of the house. Overall, [name] feels [place] move [place] to [facility] home has been positive for both her, and for the patient. Patient's care is easier to coordinate and is now less stressful.   4. Follow up Palliative Care Visit: 09/05/2019 at [facility] in home. I spent 60 minutes providing this consultation from 10:30am to 11:30pm. More than 50% of the time in  this consultation was spent coordinating communication.    HISTORY OF PRESENT ILLNESS:  [name] is a 86 yo female with h/o atrial fibrillation with rapid ventricular response (currently NSR, apixaban, diltiazem), breast cancer (remission), dehydration, HTN, chronic back pain, depression, and dementia.   -ER visit 10/30/2018 fro abd pain (CT cholelithiasis without cholecystitis).  -hospitalization 7/21-7/30/2020 for nausea/abd pain d/t large hiatal hernia (? Pocketing of food in hernia pouch or even torsion of hernia). Surgery felt inadvisable.   This is a f/u Palliative Care visit form 01/03/2020.   CODE STATUS: DNR   PPS: 40%   HOSPICE ELIGIBILITY/DIAGNOSIS: TBD  PAST MEDICAL HISTORY:  Past Medical History:  Diagnosis Date  . Arthritis    low spine  . Breast cancer [facility]) 2013   right breast  . Breast cancer, right breast, recurrent 06/03/2011  . Cancer [facility]) 1989   breast right  . Chronic back pain   . Hypertension     SOCIAL HX:  Social History   Tobacco Use  . Smoking status: Former Smoker    Quit date: 05/11/1970    Years since quitting: 49.1  . Smokeless tobacco: Never Used  Substance Use Topics  . Alcohol use: No    ALLERGIES:  Allergies  Allergen Reactions  . Arimidex [Anastrozole] Other (See Comments)    Neck cramping  . Fish Allergy Other (See Comments)    Patient prefers to NOT eat this  . Morphine And Related Nausea And Vomiting  . Shellfish Allergy Other (See Comments)    Patient prefers to NOT eat this  . Vicodin [Hydrocodone-Acetaminophen] Nausea Only    DOES tolerate Vicodin/Norco, however     PERTINENT MEDICATIONS:  Outpatient Encounter Medications as of 06/09/2019  Medication Sig  . acetaminophen (TYLENOL) 325 MG tablet Take 2 tablets (650 mg total) by mouth every 6 (six) hours as needed for mild pain, fever or headache.  . diltiazem (CARDIZEM CD) 120 MG 24 hr capsule TAKE 1 CAPSULE (120 MG TOTAL) BY MOUTH DAILY. PLEASE MAKE OVERDUE APPT  . diltiazem (CARDIZEM CD) 240 MG 24 hr capsule Take 1 capsule (240 mg total) by mouth daily.  Marland Kitchen ELIQUIS 5 MG TABS tablet TAKE 1 TABLET BY MOUTH TWICE A DAY (Patient taking differently: Take 5 mg by mouth 2 (two) times daily. )  . gabapentin (NEURONTIN) 100 MG capsule Take 100 mg by mouth at bedtime.   Marland Kitchen  HYDROcodone-acetaminophen (NORCO/VICODIN) 5-325 MG tablet Take 1 tablet by mouth every 8 (eight) hours as needed for pain.  Marland Kitchen losartan (COZAAR) 25 MG tablet Take 2 tablets (50 mg total) by mouth daily. NEED OV.  . Multiple Vitamins-Minerals (CENTRUM SILVER 50+WOMEN PO) Take 1 tablet by mouth daily with breakfast.  . nitroGLYCERIN (NITROSTAT) 0.4 MG SL tablet Place 1 tablet (0.4 mg total) under the tongue every 5 (five) minutes x 3 doses as needed for chest pain. (Patient not taking: Reported on 11/27/2018)  . ondansetron (ZOFRAN) 4 MG tablet Take 1 tablet (4 mg total) by mouth every 8 (eight) hours as needed for nausea or vomiting.  . pantoprazole (PROTONIX) 40 MG tablet Take 1 tablet (40 mg total) by mouth daily.  . predniSONE (DELTASONE) 2.5 MG tablet Take 1.25 mg by mouth daily.   . sertraline (ZOLOFT) 50 MG tablet Take 50 mg by mouth every morning.   . vitamin C (ASCORBIC ACID) 500 MG tablet Take 500 mg by mouth daily.  Marland Kitchen VITAMIN E PO Take 1,000 Units by mouth daily.  No facility-administered encounter medications on file as of 06/09/2019.    PHYSICAL EXAM:   Patient is alert and oriented; very pleasantly conversant and engaging. Daughter [name], paid care giver, and [name]'s husband are in attendance.  Abbreviated PE to limit COVID exposure. No rashes exposed skin area.  Neruo exam grossly non-focal   Julianne Handler, NP

## 2019-06-29 DIAGNOSIS — Z961 Presence of intraocular lens: Secondary | ICD-10-CM | POA: Diagnosis not present

## 2019-06-29 DIAGNOSIS — H532 Diplopia: Secondary | ICD-10-CM | POA: Diagnosis not present

## 2019-06-29 DIAGNOSIS — H43813 Vitreous degeneration, bilateral: Secondary | ICD-10-CM | POA: Diagnosis not present

## 2019-06-29 DIAGNOSIS — H524 Presbyopia: Secondary | ICD-10-CM | POA: Diagnosis not present

## 2019-09-08 ENCOUNTER — Other Ambulatory Visit: Payer: Self-pay

## 2019-09-08 ENCOUNTER — Other Ambulatory Visit: Payer: Medicare HMO | Admitting: Internal Medicine

## 2019-09-08 DIAGNOSIS — Z7189 Other specified counseling: Secondary | ICD-10-CM | POA: Diagnosis not present

## 2019-09-08 DIAGNOSIS — Z515 Encounter for palliative care: Secondary | ICD-10-CM

## 2019-09-11 ENCOUNTER — Encounter: Payer: Self-pay | Admitting: Internal Medicine

## 2019-09-11 NOTE — Progress Notes (Signed)
May 28th, 2021 [facility] Palliative Care Consult Note Telephone: [phone]  Fax: [phone]     PATIENT NAME: [name]                       DOB: [birth year] MRN: [id number] [address], [place] [address]: mail DNR, MOST, email my email address.  (H) [id number], (M) [phone]   PRIMARY CARE PROVIDER:   Mayra Neer, MD   REFERRING PROVIDER:  Mayra Neer, MD [address] [place], [place] [address]   RESPONSIBLE PARTY: (daughter) [name] [phone], ([phone]. Email address:  [email or web address].    ASSESSMENT / RECOMMENDATIONS:  1.Advance Care Planning:             A. Directives: DNR and MOST forms in the home. MOST details:  DNR/DNI. Limited Scope of Medical Interventions. Yes to IVFs and Antibiotics. No tube feeding.               B. Goals of Care:  -Keep out of Nursing Home. Keep patient safe and happy. -Conservative management. No plans for feeding tube or surgery. If patient worsens in the future, consideration would be given to hospice.     2. Cognitive / Functional status: FAST 6b. Patient continues with mild confusion which waxes and wanes. She's having a little more trouble with word finding, and more repetitious thoughts and conversation. Happy affect. She is mostly independent in transfers, and motors about in her rollator walker. She is independent in toileting and personal hygiene. She has a fall in the bathroom, and another walking outside. But her daughter tries to let her do as much for herself as possible. Patient needs assist with showering. She can select her own clothing and needs some assist with dressing. Occasional constipation managed with prn Miralax. Occasional incontinence of urine (adult pull ups) with urinary urgency. Her appetite is very good. Weight today is 169.6 lbs (gain of 12.6 lbs over the last 3 months). At a height of 5' her BMI is 33.1kg/m2.  Patient  with arthritic pain in neck r/t osteoporosis managed with hydrocodone few times a week. -Recommended Tylenol lower back, hips, and knees. 1000 tid on a regular schedule, in preference to prn opioid. -Left and reviewed some low impact exercises; goal 10 x each of 6 exercises, up to tid.    -Discussed use of grab bars in the bathroom; we reviewed some spots.  3. Family Supports: Patient is widowed. Lives with her daughter [name]; arrangement working out well. [name] now working part time. There is 24/7 coverage within the home to watch both the patient, and [name], either covered by [name] or private paid care givers (M-F 9a-5p). [name] has a sister [name] ([place]); has health problems and is unable to assist. [name] has 2 brothers, both deceased (one recently). [name] copes with her job and caregiving responsibilities by "taking one day at a time". She seems to be maintaining her good spirits. Enjoys working as it provides her with a change of pace/get out of the house.    4. Follow up Palliative Care Visit: [name] will call us on a prn basis, or if signs of patient decline. She has my contact information.  I spent 60 minutes providing this consultation from 10:30am to 11:30pm. More than 50% of the time in this consultation was spent coordinating communication.    HISTORY OF PRESENT ILLNESS:  [name] is a  87 yo female with h/o atrial fibrillation with rapid ventricular response (currently NSR, apixaban, diltiazem), breast cancer (remission), dehydration, HTN, chronic back pain, depression, and dementia.  -ER visit 10/30/2018 fro abd pain (CT cholelithiasis without cholecystitis).  -hospitalization 7/21-7/30/2020 for nausea/abd pain d/t large hiatal hernia (? Pocketing of food in hernia pouch or even torsion of hernia). Surgery felt inadvisable.    This is a f/u Palliative Care visit form 06/09/2019.   CODE STATUS: DNR   PPS: 40%   HOSPICE ELIGIBILITY/DIAGNOSIS:  TBD PAST MEDICAL HISTORY:  Past Medical History:  Diagnosis Date  . Arthritis    low spine  . Breast cancer [facility]) 2013   right breast  . Breast cancer, right breast, recurrent 06/03/2011  . Cancer [facility]) 1989   breast right  . Chronic back pain   . Hypertension     SOCIAL HX:  Social History   Tobacco Use  . Smoking status: Former Smoker    Quit date: 05/11/1970    Years since quitting: 49.3  . Smokeless tobacco: Never Used  Substance Use Topics  . Alcohol use: No    ALLERGIES:  Allergies  Allergen Reactions  . Arimidex [Anastrozole] Other (See Comments)    Neck cramping  . Fish Allergy Other (See Comments)    Patient prefers to NOT eat this  . Morphine And Related Nausea And Vomiting  . Shellfish Allergy Other (See Comments)    Patient prefers to NOT eat this  . Vicodin [Hydrocodone-Acetaminophen] Nausea Only    DOES tolerate Vicodin/Norco, however     PERTINENT MEDICATIONS:  Outpatient Encounter Medications as of 09/08/2019  Medication Sig  . acetaminophen (TYLENOL) 325 MG tablet Take 2 tablets (650 mg total) by mouth every 6 (six) hours as needed for mild pain, fever or headache.  . diltiazem (CARDIZEM CD) 120 MG 24 hr capsule TAKE 1 CAPSULE (120 MG TOTAL) BY MOUTH DAILY. PLEASE MAKE OVERDUE APPT  . diltiazem (CARDIZEM CD) 240 MG 24 hr capsule Take 1 capsule (240 mg total) by mouth daily.  Marland Kitchen ELIQUIS 5 MG TABS tablet TAKE 1 TABLET BY MOUTH TWICE A DAY (Patient taking differently: Take 5 mg by mouth 2 (two) times daily. )  . gabapentin (NEURONTIN) 100 MG capsule Take 100 mg by mouth at bedtime.   Marland Kitchen HYDROcodone-acetaminophen (NORCO/VICODIN) 5-325 MG tablet Take 1 tablet by mouth every 8 (eight) hours as needed for pain.  Marland Kitchen losartan (COZAAR) 25 MG tablet Take 2 tablets (50 mg total) by mouth daily. NEED OV.  . Multiple Vitamins-Minerals (CENTRUM SILVER 50+WOMEN PO) Take 1 tablet by mouth daily with breakfast.  . nitroGLYCERIN (NITROSTAT) 0.4 MG SL tablet Place 1 tablet  (0.4 mg total) under the tongue every 5 (five) minutes x 3 doses as needed for chest pain. (Patient not taking: Reported on 11/27/2018)  . ondansetron (ZOFRAN) 4 MG tablet Take 1 tablet (4 mg total) by mouth every 8 (eight) hours as needed for nausea or vomiting.  . pantoprazole (PROTONIX) 40 MG tablet Take 1 tablet (40 mg total) by mouth daily.  . predniSONE (DELTASONE) 2.5 MG tablet Take 1.25 mg by mouth daily.   . sertraline (ZOLOFT) 50 MG tablet Take 50 mg by mouth every morning.   . vitamin C (ASCORBIC ACID) 500 MG tablet Take 500 mg by mouth daily.  Marland Kitchen VITAMIN E PO Take 1,000 Units by mouth daily.    No facility-administered encounter medications on file as of 09/08/2019.    PHYSICAL EXAM:   152/84 . HR  80 slightly irregular. Sat 97% HR 71 Patient is alert and oriented; very pleasantly conversant and engaging. Daughter [name], paid care giver, and Makayla husband are in attendance.  Abbreviated PE to limit COVID exposure. No rashes exposed skin area.  Neruo exam grossly non-focal   Julianne Handler, NP

## 2019-12-01 DIAGNOSIS — R296 Repeated falls: Secondary | ICD-10-CM | POA: Diagnosis not present

## 2019-12-01 DIAGNOSIS — I10 Essential (primary) hypertension: Secondary | ICD-10-CM | POA: Diagnosis not present

## 2019-12-01 DIAGNOSIS — K449 Diaphragmatic hernia without obstruction or gangrene: Secondary | ICD-10-CM | POA: Diagnosis not present

## 2019-12-01 DIAGNOSIS — E1169 Type 2 diabetes mellitus with other specified complication: Secondary | ICD-10-CM | POA: Diagnosis not present

## 2019-12-01 DIAGNOSIS — M549 Dorsalgia, unspecified: Secondary | ICD-10-CM | POA: Diagnosis not present

## 2019-12-01 DIAGNOSIS — R8290 Unspecified abnormal findings in urine: Secondary | ICD-10-CM | POA: Diagnosis not present

## 2019-12-01 DIAGNOSIS — F0390 Unspecified dementia without behavioral disturbance: Secondary | ICD-10-CM | POA: Diagnosis not present

## 2019-12-01 DIAGNOSIS — I4891 Unspecified atrial fibrillation: Secondary | ICD-10-CM | POA: Diagnosis not present

## 2020-03-20 DIAGNOSIS — R0609 Other forms of dyspnea: Secondary | ICD-10-CM | POA: Diagnosis not present

## 2020-03-20 DIAGNOSIS — R609 Edema, unspecified: Secondary | ICD-10-CM | POA: Diagnosis not present

## 2020-04-24 ENCOUNTER — Telehealth: Payer: Self-pay

## 2020-04-24 NOTE — Telephone Encounter (Signed)
Volunteer called patient on behalf of Palliative Care and did not get a answer from patient/family. ° °

## 2020-05-28 DIAGNOSIS — E1169 Type 2 diabetes mellitus with other specified complication: Secondary | ICD-10-CM | POA: Diagnosis not present

## 2020-05-28 DIAGNOSIS — R600 Localized edema: Secondary | ICD-10-CM | POA: Diagnosis not present

## 2020-05-28 DIAGNOSIS — R296 Repeated falls: Secondary | ICD-10-CM | POA: Diagnosis not present

## 2020-05-28 DIAGNOSIS — K449 Diaphragmatic hernia without obstruction or gangrene: Secondary | ICD-10-CM | POA: Diagnosis not present

## 2020-05-28 DIAGNOSIS — I10 Essential (primary) hypertension: Secondary | ICD-10-CM | POA: Diagnosis not present

## 2020-05-28 DIAGNOSIS — M109 Gout, unspecified: Secondary | ICD-10-CM | POA: Diagnosis not present

## 2020-05-28 DIAGNOSIS — F0390 Unspecified dementia without behavioral disturbance: Secondary | ICD-10-CM | POA: Diagnosis not present

## 2020-05-28 DIAGNOSIS — I4891 Unspecified atrial fibrillation: Secondary | ICD-10-CM | POA: Diagnosis not present

## 2020-05-28 DIAGNOSIS — M549 Dorsalgia, unspecified: Secondary | ICD-10-CM | POA: Diagnosis not present

## 2020-08-03 ENCOUNTER — Emergency Department (HOSPITAL_BASED_OUTPATIENT_CLINIC_OR_DEPARTMENT_OTHER): Payer: Medicare HMO | Admitting: Radiology

## 2020-08-03 ENCOUNTER — Other Ambulatory Visit: Payer: Self-pay

## 2020-08-03 ENCOUNTER — Emergency Department (HOSPITAL_BASED_OUTPATIENT_CLINIC_OR_DEPARTMENT_OTHER): Payer: Medicare HMO

## 2020-08-03 ENCOUNTER — Emergency Department (HOSPITAL_BASED_OUTPATIENT_CLINIC_OR_DEPARTMENT_OTHER)
Admission: EM | Admit: 2020-08-03 | Discharge: 2020-08-04 | Disposition: A | Discharge status: Home / Self Care | Payer: Medicare HMO | Attending: Emergency Medicine | Admitting: Emergency Medicine

## 2020-08-03 DIAGNOSIS — Z79899 Other long term (current) drug therapy: Secondary | ICD-10-CM | POA: Diagnosis not present

## 2020-08-03 DIAGNOSIS — Z7901 Long term (current) use of anticoagulants: Secondary | ICD-10-CM | POA: Diagnosis not present

## 2020-08-03 DIAGNOSIS — S6991XA Unspecified injury of right wrist, hand and finger(s), initial encounter: Secondary | ICD-10-CM | POA: Diagnosis present

## 2020-08-03 DIAGNOSIS — S0990XA Unspecified injury of head, initial encounter: Secondary | ICD-10-CM | POA: Insufficient documentation

## 2020-08-03 DIAGNOSIS — Z853 Personal history of malignant neoplasm of breast: Secondary | ICD-10-CM | POA: Insufficient documentation

## 2020-08-03 DIAGNOSIS — S52614A Nondisplaced fracture of right ulna styloid process, initial encounter for closed fracture: Secondary | ICD-10-CM

## 2020-08-03 DIAGNOSIS — S52501A Unspecified fracture of the lower end of right radius, initial encounter for closed fracture: Secondary | ICD-10-CM | POA: Diagnosis not present

## 2020-08-03 DIAGNOSIS — W19XXXA Unspecified fall, initial encounter: Secondary | ICD-10-CM

## 2020-08-03 DIAGNOSIS — W06XXXA Fall from bed, initial encounter: Secondary | ICD-10-CM | POA: Insufficient documentation

## 2020-08-03 DIAGNOSIS — I10 Essential (primary) hypertension: Secondary | ICD-10-CM | POA: Diagnosis not present

## 2020-08-03 DIAGNOSIS — M25552 Pain in left hip: Secondary | ICD-10-CM | POA: Diagnosis not present

## 2020-08-03 DIAGNOSIS — Z96653 Presence of artificial knee joint, bilateral: Secondary | ICD-10-CM | POA: Diagnosis not present

## 2020-08-03 DIAGNOSIS — I482 Chronic atrial fibrillation, unspecified: Secondary | ICD-10-CM | POA: Diagnosis not present

## 2020-08-03 DIAGNOSIS — Z87891 Personal history of nicotine dependence: Secondary | ICD-10-CM | POA: Insufficient documentation

## 2020-08-03 DIAGNOSIS — S52321A Displaced transverse fracture of shaft of right radius, initial encounter for closed fracture: Secondary | ICD-10-CM | POA: Diagnosis not present

## 2020-08-03 NOTE — ED Triage Notes (Addendum)
Pt was getting ready for bed this evening when she "lost balance" and fell onto her right wrist, as well as hit her head on her daughter's knee. Pt also c/o left hip and leg pain. Daughter witnessed the fall and pt did not have LOC. Denies dizziness prior to fall.   Pt is on Eliquis.

## 2020-08-03 NOTE — Discharge Instructions (Signed)
Your imaging today confirmed fracture of the 2 long bones going into the wrist joint.  The other imaging was reassuring.  Please leave the splint in place and follow-up with your hand surgeon in the next several days for reassessment.  Please rest and stay hydrated.  If any symptoms change or worsen, please return to the nearest emergency department.

## 2020-08-03 NOTE — ED Provider Notes (Signed)
[place] EMERGENCY DEPT Provider Note   CSN: [id number] Arrival date & time: 08/03/20  2034     History Chief Complaint  Patient presents with  . Fall  . Wrist Pain    [name] is a 87 y.o. female.  The history is provided by the patient and a relative. No language interpreter was used.  Fall This is a new problem. The current episode started 1 to 2 hours ago. The problem occurs rarely. The problem has not changed since onset.Pertinent negatives include no chest pain, no abdominal pain, no headaches and no shortness of breath. Nothing aggravates the symptoms. Nothing relieves the symptoms. She has tried nothing for the symptoms. The treatment provided no relief.       Past Medical History:  Diagnosis Date  . Arthritis    low spine  . Breast cancer [facility]) 2013   right breast  . Breast cancer, right breast, recurrent 06/03/2011  . Cancer [facility]) 1989   breast right  . Chronic back pain   . Hypertension     Patient Active Problem List   Diagnosis Date Noted  . Abdominal discomfort, epigastric   . Goals of care, counseling/discussion   . Palliative care by specialist   . Abdominal pain 11/02/2018  . Nausea and vomiting 11/02/2018  . Dehydration 11/02/2018  . Dyslipidemia 11/02/2018  . Chronic back pain 11/02/2018  . Acute cholecystitis 11/02/2018  . Cholelithiasis 11/01/2018  . Atrial fibrillation, chronic ([place]) 10/18/2018  . Gallstones 10/18/2018  . Acute encephalopathy 10/17/2018  . Atrial fibrillation, new onset ([place]) 09/13/2016  . Chronic anticoagulation 09/13/2016  . Essential hypertension 09/13/2016  . Chest pain with moderate risk of acute coronary syndrome 09/12/2016  . Breast cancer, right breast, recurrent 06/03/2011  . Personal history of malignant neoplasm of breast 04/14/1987    Past Surgical History:  Procedure Laterality Date  . ABDOMINAL HYSTERECTOMY  1974  . BREAST SURGERY  1989   rt lumpectomy-axillary dissection  .  COLONOSCOPY    . EYE SURGERY     both cataracts  . JOINT REPLACEMENT  2004, 2005   both knees   . MASTECTOMY Right 06/11/11   right breast  . SHOULDER SURGERY     patient does not remember date of procedure     OB History   No obstetric history on file.     Family History  Problem Relation Age of Onset  . Heart disease Mother   . Breast cancer Maternal Aunt   . Cancer Brother        unaware  . Heart disease Brother     Social History   Tobacco Use  . Smoking status: Former Smoker    Quit date: 05/11/1970    Years since quitting: 50.2  . Smokeless tobacco: Never Used  Vaping Use  . Vaping Use: Never used  Substance Use Topics  . Alcohol use: No  . Drug use: No    Home Medications Prior to Admission medications   Medication Sig Start Date End Date Taking? Authorizing Provider  acetaminophen (TYLENOL) 325 MG tablet Take 2 tablets (650 mg total) by mouth every 6 (six) hours as needed for mild pain, fever or headache. 11/10/18   Samuella Cota, MD  diltiazem (CARDIZEM CD) 120 MG 24 hr capsule TAKE 1 CAPSULE (120 MG TOTAL) BY MOUTH DAILY. PLEASE MAKE OVERDUE APPT 03/06/19   Croitoru, Dani Gobble, MD  diltiazem (CARDIZEM CD) 240 MG 24 hr capsule Take 1 capsule (240 mg total) by  mouth daily. 11/11/18   Samuella Cota, MD  ELIQUIS 5 MG TABS tablet TAKE 1 TABLET BY MOUTH TWICE A DAY Patient taking differently: Take 5 mg by mouth 2 (two) times daily.  07/14/18   Croitoru, Mihai, MD  gabapentin (NEURONTIN) 100 MG capsule Take 100 mg by mouth at bedtime.  04/03/11   [provider]  HYDROcodone-acetaminophen (NORCO/VICODIN) 5-325 MG tablet Take 1 tablet by mouth every 8 (eight) hours as needed for pain. 01/25/18   [provider]  losartan (COZAAR) 25 MG tablet Take 2 tablets (50 mg total) by mouth daily. NEED OV. 11/28/18   Erlene Quan, PA-C  Multiple Vitamins-Minerals (CENTRUM SILVER 50+WOMEN PO) Take 1 tablet by mouth daily with breakfast.    [provider]  nitroGLYCERIN (NITROSTAT) 0.4 MG SL tablet Place 1 tablet (0.4 mg total) under the tongue every 5 (five) minutes x 3 doses as needed for chest pain. Patient not taking: Reported on 11/27/2018 09/13/16   Erlene Quan, PA-C  ondansetron (ZOFRAN) 4 MG tablet Take 1 tablet (4 mg total) by mouth every 8 (eight) hours as needed for nausea or vomiting. 10/29/18   Benay Pike, MD  pantoprazole (PROTONIX) 40 MG tablet Take 1 tablet (40 mg total) by mouth daily. 11/11/18   Samuella Cota, MD  predniSONE (DELTASONE) 2.5 MG tablet Take 1.25 mg by mouth daily.  12/27/17   [provider]  sertraline (ZOLOFT) 50 MG tablet Take 50 mg by mouth every morning.  02/04/18   [provider]  vitamin C (ASCORBIC ACID) 500 MG tablet Take 500 mg by mouth daily.    [provider]  VITAMIN E PO Take 1,000 Units by mouth daily.     [provider]    Allergies    Arimidex [anastrozole], Fish allergy, Morphine and related, Shellfish allergy, and Vicodin [hydrocodone-acetaminophen]  Review of Systems   Review of Systems  Constitutional: Negative for chills, diaphoresis, fatigue and fever.  HENT: Negative for congestion.   Eyes: Negative for photophobia and visual disturbance.  Respiratory: Negative for cough, chest tightness and shortness of breath.   Cardiovascular: Negative for chest pain.  Gastrointestinal: Negative for abdominal pain, constipation, diarrhea, nausea and vomiting.  Genitourinary: Negative for dysuria.  Musculoskeletal: Negative for back pain, neck pain and neck stiffness.  Skin: Negative for rash and wound.  Neurological: Negative for dizziness, weakness, light-headedness, numbness and headaches.  Psychiatric/Behavioral: Negative for agitation.  All other systems reviewed and are negative.   Physical Exam Updated Vital Signs BP (!) 149/68 (BP Location: Left Arm)   Pulse 73   Temp 98.1 F (36.7 C) (Oral)   Resp 16   Ht 5' (1.524 m)    SpO2 98%   BMI 33.12 kg/m   Physical Exam Vitals and nursing note reviewed.  Constitutional:      General: She is not in acute distress.    Appearance: She is well-developed. She is not ill-appearing, toxic-appearing or diaphoretic.  HENT:     Head: Normocephalic and atraumatic.     Nose: No congestion or rhinorrhea.     Mouth/Throat:     Mouth: Mucous membranes are moist.  Eyes:     Extraocular Movements: Extraocular movements intact.     Conjunctiva/sclera: Conjunctivae normal.     Pupils: Pupils are equal, round, and reactive to light.  Cardiovascular:     Rate and Rhythm: Normal rate and regular rhythm.     Heart sounds: No murmur heard.  Pulmonary:     Effort: Pulmonary effort is normal. No respiratory distress.     Breath sounds: Normal breath sounds. No wheezing, rhonchi or rales.  Chest:     Chest wall: No tenderness.  Abdominal:     General: Abdomen is flat.     Palpations: Abdomen is soft.     Tenderness: There is no abdominal tenderness. There is no right CVA tenderness, left CVA tenderness, guarding or rebound.  Musculoskeletal:        General: Tenderness and signs of injury present.     Right wrist: Swelling, tenderness and snuff box tenderness present. No deformity or lacerations. Normal range of motion. Normal pulse.     Right hand: Swelling and tenderness present.       Arms:     Cervical back: Neck supple. No tenderness.     Left hip: Tenderness present. No deformity, lacerations or crepitus. Normal strength.       Legs:  Skin:    General: Skin is warm and dry.     Capillary Refill: Capillary refill takes less than 2 seconds.     Findings: No erythema or rash.  Neurological:     General: No focal deficit present.     Mental Status: She is alert and oriented to person, place, and time.     Sensory: No sensory deficit.     Motor: No weakness.  Psychiatric:        Mood and Affect: Mood normal.     ED Results / Procedures / Treatments    Labs (all labs ordered are listed, but only abnormal results are displayed) Labs Reviewed - No data to display  EKG None  Radiology DG Wrist Complete Right  Result Date: 08/03/2020 CLINICAL DATA:  Status post fall. EXAM: RIGHT HAND - COMPLETE 3+ VIEW; RIGHT WRIST - COMPLETE 3+ VIEW COMPARISON:  X-ray right hand 02/05/2017 FINDINGS: Diffusely decreased bone density. Minimally displaced transverse distal radial fracture with no definite intra-articular extension. Likely nondisplaced acute ulnar styloid fracture. No acute displaced fracture or dislocation of the bones of the right hand. At least moderate degenerative changes of the first and second carpometacarpal joint. Degenerative changes of the first, second, third metacarpophalangeal joints. Mild to moderate degenerative changes of the proximal distal interphalangeal joints. Degenerative change of the carpal joints. Question chondrocalcinosis of the TFCC. Subcutaneus soft tissue edema.  Vascular calcifications. IMPRESSION: 1. Minimally displaced, likely extra-articular, transverse acute distal radial fracture. 2. Likely nondisplaced acute ulnar styloid fracture. 3. No acute displaced fracture or dislocation of the bones of the right hand in a patient with multifocal degenerative changes. 4. Diffusely decreased bone density limited evaluation. Electronically Signed   By: Iven Finn M.D.   On: 08/03/2020 23:17   CT Head Wo Contrast  Result Date: 08/03/2020 CLINICAL DATA:  Head trauma due to a fall.  On Eliquis. EXAM: CT HEAD WITHOUT CONTRAST TECHNIQUE: Contiguous axial images were obtained from the base of the skull through the vertex without intravenous contrast. COMPARISON:  CT head 10/17/2018.  MRI brain 10/18/2018 FINDINGS: Brain: No evidence of acute infarction, hemorrhage, hydrocephalus, extra-axial collection or mass lesion/mass effect. Mild diffuse cerebral atrophy. Mild ventricular dilatation consistent with central atrophy.  Low-attenuation changes in the deep white matter consistent with small vessel ischemia. Similar appearance to previous study. Vascular: Moderate intracranial arterial vascular calcifications. Skull: The calvarium appears intact. Sinuses/Orbits: Paranasal sinuses and mastoid air cells are clear. Postoperative changes in the globes. Other: None. IMPRESSION: 1. No acute intracranial abnormalities.  2. Chronic atrophy and small vessel ischemic changes. Electronically Signed   By: Lucienne Capers M.D.   On: 08/03/2020 22:51   DG Hand Complete Right  Result Date: 08/03/2020 CLINICAL DATA:  Status post fall. EXAM: RIGHT HAND - COMPLETE 3+ VIEW; RIGHT WRIST - COMPLETE 3+ VIEW COMPARISON:  X-ray right hand 02/05/2017 FINDINGS: Diffusely decreased bone density. Minimally displaced transverse distal radial fracture with no definite intra-articular extension. Likely nondisplaced acute ulnar styloid fracture. No acute displaced fracture or dislocation of the bones of the right hand. At least moderate degenerative changes of the first and second carpometacarpal joint. Degenerative changes of the first, second, third metacarpophalangeal joints. Mild to moderate degenerative changes of the proximal distal interphalangeal joints. Degenerative change of the carpal joints. Question chondrocalcinosis of the TFCC. Subcutaneus soft tissue edema.  Vascular calcifications. IMPRESSION: 1. Minimally displaced, likely extra-articular, transverse acute distal radial fracture. 2. Likely nondisplaced acute ulnar styloid fracture. 3. No acute displaced fracture or dislocation of the bones of the right hand in a patient with multifocal degenerative changes. 4. Diffusely decreased bone density limited evaluation. Electronically Signed   By: Iven Finn M.D.   On: 08/03/2020 23:17   DG Hip Unilat W or Wo Pelvis 2-3 Views Left  Result Date: 08/03/2020 CLINICAL DATA:  Left hip and leg pain after a fall. EXAM: DG HIP (WITH OR WITHOUT  PELVIS) 2-3V LEFT COMPARISON:  None. FINDINGS: Diffuse bone demineralization. Degenerative changes in the lower lumbar spine and in both hips. The pelvis and left hip appear intact. No acute displaced fractures are identified. No focal bone lesion or bone destruction. SI joints and symphysis pubis are not displaced. Vascular calcifications are present. IMPRESSION: Degenerative changes in the hips and lower lumbar spine. No acute displaced fractures identified. Electronically Signed   By: Lucienne Capers M.D.   On: 08/03/2020 23:13    Procedures Procedures   Medications Ordered in ED Medications - No data to display  ED Course  I have reviewed the triage vital signs and the nursing notes.  Pertinent labs & imaging results that were available during my care of the patient were reviewed by me and considered in my medical decision making (see chart for details).    MDM Rules/Calculators/A&P                          [name] is a 87 y.o. female with a past medical history significant for hypertension, atrial fibrillation on Eliquis therapy, prior breast cancer, and dyslipidemia who presents with injuries after a fall.  Patient reports that she was trying to turn around in her room to get ready get in bed when she got tripped up with her legs and then fell headfirst into her daughter who was near her.  Patient reports her forehead hit her daughter's knee causing her daughter's knees to bruise.  Patient did not lose consciousness and is not significant headache.  She is also reporting new pain in her left hip and pain in her right wrist and right base of the thumb.  She reports the pain is moderate.  She denies loss of consciousness, nausea, vomiting, vision changes, speech difficulty, or any numbness, tingling, weakness of extremities.  She is reporting significant pain in her left hip and the right hand/wrist.  She is right-handed.  She back, neck, or torso otherwise.  Daughter reports she was  at her baseline and had no preceding symptoms whatsoever.  On exam, patient  does have tenderness in her left hip without manipulation.  Leg length is symmetric.  Normal sensation and strength in legs.  Good pulses.  Normal strength and sensation in upper extremities.  Patient does have symmetric grip strength but has pain with thumb manipulation and movement on her right hand.  She does have tenderness in the anatomic snuffbox.  She has normal cap refill, sensation, and pulses.  Tenderness in the right wrist.  No elbow tenderness or shoulder tenderness.  No evidence of bruising or laceration to the forehead.  No focal neurologic deficits otherwise.  Had a shared decision-making conversation with patient and daughter.  We will get a head CT given her Eliquis use and the amount of force that the daughter need her mother in the forehead.  We will also get x-rays of the right hand and wrist and x-rays of the left hip.  Given her lack of preceding symptoms, family does not want to pursue other screening labs or other work-up as this was likely mechanical fall.  Anticipate reassessment after work-up.  11:31 PM Hip x-ray shows no fracture.  CT head shows no acute intracranial bleeding or fracture.  Wrist x-ray does show minimally displaced distal radius fracture and likely nondisplaced acute ulnar styloid fracture.  No fracture in the hand bones or evidence of injury to her scaphoid bone.  Will place patient in sugar-tong splint and have her follow-up with her hand doctor.  Patient and family agree with plan of care and patient already has pain medicine at home.  Patient family agree with plan and patient discharged in good condition after splint placement.   Final Clinical Impression(s) / ED Diagnoses Final diagnoses:  Closed fracture of distal end of right radius, unspecified fracture morphology, initial encounter  Closed nondisplaced fracture of styloid process of right ulna, initial encounter   Fall, initial encounter    Clinical Impression: 1. Closed fracture of distal end of right radius, unspecified fracture morphology, initial encounter   2. Closed nondisplaced fracture of styloid process of right ulna, initial encounter   3. Fall, initial encounter     Disposition: Discharge  Condition: Good  I have discussed the results, Dx and Tx plan with the pt(& family if present). He/she/they expressed understanding and agree(s) with the plan. Discharge instructions discussed at great length. Strict return precautions discussed and pt &/or family have verbalized understanding of the instructions. No further questions at time of discharge.    New Prescriptions   No medications on file    Follow Up: your orthopedic hand surgeon.         Mathias Bogacki, Gwenyth Allegra, MD 08/03/20 [phone]

## 2020-08-04 DIAGNOSIS — S52501A Unspecified fracture of the lower end of right radius, initial encounter for closed fracture: Secondary | ICD-10-CM | POA: Diagnosis not present

## 2020-08-07 DIAGNOSIS — S52501A Unspecified fracture of the lower end of right radius, initial encounter for closed fracture: Secondary | ICD-10-CM | POA: Diagnosis not present

## 2020-08-07 DIAGNOSIS — S52614A Nondisplaced fracture of right ulna styloid process, initial encounter for closed fracture: Secondary | ICD-10-CM | POA: Diagnosis not present

## 2020-08-21 DIAGNOSIS — M25531 Pain in right wrist: Secondary | ICD-10-CM | POA: Diagnosis not present

## 2020-08-21 DIAGNOSIS — S52501D Unspecified fracture of the lower end of right radius, subsequent encounter for closed fracture with routine healing: Secondary | ICD-10-CM | POA: Diagnosis not present

## 2020-08-21 DIAGNOSIS — S52614A Nondisplaced fracture of right ulna styloid process, initial encounter for closed fracture: Secondary | ICD-10-CM | POA: Diagnosis not present

## 2020-08-21 DIAGNOSIS — S52501A Unspecified fracture of the lower end of right radius, initial encounter for closed fracture: Secondary | ICD-10-CM | POA: Diagnosis not present

## 2020-09-03 DIAGNOSIS — F0390 Unspecified dementia without behavioral disturbance: Secondary | ICD-10-CM | POA: Diagnosis not present

## 2020-09-03 DIAGNOSIS — N3281 Overactive bladder: Secondary | ICD-10-CM | POA: Diagnosis not present

## 2020-09-03 DIAGNOSIS — E782 Mixed hyperlipidemia: Secondary | ICD-10-CM | POA: Diagnosis not present

## 2020-09-03 DIAGNOSIS — E1169 Type 2 diabetes mellitus with other specified complication: Secondary | ICD-10-CM | POA: Diagnosis not present

## 2020-09-03 DIAGNOSIS — K449 Diaphragmatic hernia without obstruction or gangrene: Secondary | ICD-10-CM | POA: Diagnosis not present

## 2020-09-03 DIAGNOSIS — M549 Dorsalgia, unspecified: Secondary | ICD-10-CM | POA: Diagnosis not present

## 2020-09-03 DIAGNOSIS — I4891 Unspecified atrial fibrillation: Secondary | ICD-10-CM | POA: Diagnosis not present

## 2020-09-03 DIAGNOSIS — E119 Type 2 diabetes mellitus without complications: Secondary | ICD-10-CM | POA: Diagnosis not present

## 2020-09-03 DIAGNOSIS — Z0001 Encounter for general adult medical examination with abnormal findings: Secondary | ICD-10-CM | POA: Diagnosis not present

## 2020-09-03 DIAGNOSIS — I10 Essential (primary) hypertension: Secondary | ICD-10-CM | POA: Diagnosis not present

## 2020-09-04 DIAGNOSIS — S52501D Unspecified fracture of the lower end of right radius, subsequent encounter for closed fracture with routine healing: Secondary | ICD-10-CM | POA: Diagnosis not present

## 2020-09-04 DIAGNOSIS — S52501A Unspecified fracture of the lower end of right radius, initial encounter for closed fracture: Secondary | ICD-10-CM | POA: Diagnosis not present

## 2020-09-04 DIAGNOSIS — M25531 Pain in right wrist: Secondary | ICD-10-CM | POA: Diagnosis not present

## 2020-09-04 DIAGNOSIS — S52614A Nondisplaced fracture of right ulna styloid process, initial encounter for closed fracture: Secondary | ICD-10-CM | POA: Diagnosis not present

## 2020-09-25 DIAGNOSIS — S52501D Unspecified fracture of the lower end of right radius, subsequent encounter for closed fracture with routine healing: Secondary | ICD-10-CM | POA: Diagnosis not present

## 2020-10-21 ENCOUNTER — Observation Stay (HOSPITAL_COMMUNITY): Payer: Medicare HMO

## 2020-10-21 ENCOUNTER — Emergency Department (HOSPITAL_COMMUNITY): Payer: Medicare HMO

## 2020-10-21 ENCOUNTER — Inpatient Hospital Stay (HOSPITAL_COMMUNITY)
Admission: EM | Admit: 2020-10-21 | Discharge: 2020-10-28 | DRG: 391 | Disposition: A | Discharge status: Skilled Nursing Facility | Payer: Medicare HMO | Attending: Internal Medicine | Admitting: Internal Medicine

## 2020-10-21 ENCOUNTER — Encounter (HOSPITAL_COMMUNITY): Payer: Self-pay | Admitting: Emergency Medicine

## 2020-10-21 DIAGNOSIS — Z888 Allergy status to other drugs, medicaments and biological substances status: Secondary | ICD-10-CM | POA: Diagnosis not present

## 2020-10-21 DIAGNOSIS — I4891 Unspecified atrial fibrillation: Secondary | ICD-10-CM | POA: Diagnosis not present

## 2020-10-21 DIAGNOSIS — R600 Localized edema: Secondary | ICD-10-CM | POA: Diagnosis present

## 2020-10-21 DIAGNOSIS — Z8616 Personal history of COVID-19: Secondary | ICD-10-CM | POA: Diagnosis not present

## 2020-10-21 DIAGNOSIS — G8929 Other chronic pain: Secondary | ICD-10-CM | POA: Diagnosis present

## 2020-10-21 DIAGNOSIS — M549 Dorsalgia, unspecified: Secondary | ICD-10-CM | POA: Diagnosis present

## 2020-10-21 DIAGNOSIS — Z91013 Allergy to seafood: Secondary | ICD-10-CM

## 2020-10-21 DIAGNOSIS — K5792 Diverticulitis of intestine, part unspecified, without perforation or abscess without bleeding: Secondary | ICD-10-CM | POA: Diagnosis not present

## 2020-10-21 DIAGNOSIS — Z885 Allergy status to narcotic agent status: Secondary | ICD-10-CM

## 2020-10-21 DIAGNOSIS — M47816 Spondylosis without myelopathy or radiculopathy, lumbar region: Secondary | ICD-10-CM | POA: Diagnosis present

## 2020-10-21 DIAGNOSIS — E876 Hypokalemia: Secondary | ICD-10-CM | POA: Diagnosis present

## 2020-10-21 DIAGNOSIS — Z853 Personal history of malignant neoplasm of breast: Secondary | ICD-10-CM

## 2020-10-21 DIAGNOSIS — Z79899 Other long term (current) drug therapy: Secondary | ICD-10-CM

## 2020-10-21 DIAGNOSIS — F0390 Unspecified dementia without behavioral disturbance: Secondary | ICD-10-CM | POA: Diagnosis not present

## 2020-10-21 DIAGNOSIS — Z7952 Long term (current) use of systemic steroids: Secondary | ICD-10-CM

## 2020-10-21 DIAGNOSIS — E860 Dehydration: Secondary | ICD-10-CM | POA: Diagnosis present

## 2020-10-21 DIAGNOSIS — Z7401 Bed confinement status: Secondary | ICD-10-CM | POA: Diagnosis not present

## 2020-10-21 DIAGNOSIS — G9341 Metabolic encephalopathy: Secondary | ICD-10-CM | POA: Diagnosis present

## 2020-10-21 DIAGNOSIS — R059 Cough, unspecified: Secondary | ICD-10-CM

## 2020-10-21 DIAGNOSIS — Z20822 Contact with and (suspected) exposure to covid-19: Secondary | ICD-10-CM | POA: Diagnosis present

## 2020-10-21 DIAGNOSIS — K5732 Diverticulitis of large intestine without perforation or abscess without bleeding: Secondary | ICD-10-CM | POA: Diagnosis not present

## 2020-10-21 DIAGNOSIS — R197 Diarrhea, unspecified: Secondary | ICD-10-CM | POA: Diagnosis not present

## 2020-10-21 DIAGNOSIS — G9340 Encephalopathy, unspecified: Secondary | ICD-10-CM | POA: Diagnosis present

## 2020-10-21 DIAGNOSIS — K5730 Diverticulosis of large intestine without perforation or abscess without bleeding: Secondary | ICD-10-CM | POA: Diagnosis not present

## 2020-10-21 DIAGNOSIS — Z96653 Presence of artificial knee joint, bilateral: Secondary | ICD-10-CM | POA: Diagnosis present

## 2020-10-21 DIAGNOSIS — Z90710 Acquired absence of both cervix and uterus: Secondary | ICD-10-CM

## 2020-10-21 DIAGNOSIS — K219 Gastro-esophageal reflux disease without esophagitis: Secondary | ICD-10-CM | POA: Diagnosis present

## 2020-10-21 DIAGNOSIS — Z8249 Family history of ischemic heart disease and other diseases of the circulatory system: Secondary | ICD-10-CM

## 2020-10-21 DIAGNOSIS — Z66 Do not resuscitate: Secondary | ICD-10-CM | POA: Diagnosis not present

## 2020-10-21 DIAGNOSIS — Z7189 Other specified counseling: Secondary | ICD-10-CM | POA: Diagnosis not present

## 2020-10-21 DIAGNOSIS — U071 COVID-19: Secondary | ICD-10-CM | POA: Diagnosis not present

## 2020-10-21 DIAGNOSIS — K5793 Diverticulitis of intestine, part unspecified, without perforation or abscess with bleeding: Secondary | ICD-10-CM | POA: Diagnosis not present

## 2020-10-21 DIAGNOSIS — K529 Noninfective gastroenteritis and colitis, unspecified: Secondary | ICD-10-CM | POA: Diagnosis present

## 2020-10-21 DIAGNOSIS — I482 Chronic atrial fibrillation, unspecified: Secondary | ICD-10-CM | POA: Diagnosis present

## 2020-10-21 DIAGNOSIS — Z7901 Long term (current) use of anticoagulants: Secondary | ICD-10-CM | POA: Diagnosis not present

## 2020-10-21 DIAGNOSIS — K449 Diaphragmatic hernia without obstruction or gangrene: Secondary | ICD-10-CM | POA: Diagnosis not present

## 2020-10-21 DIAGNOSIS — I959 Hypotension, unspecified: Secondary | ICD-10-CM | POA: Diagnosis not present

## 2020-10-21 DIAGNOSIS — I10 Essential (primary) hypertension: Secondary | ICD-10-CM | POA: Diagnosis present

## 2020-10-21 DIAGNOSIS — K819 Cholecystitis, unspecified: Secondary | ICD-10-CM | POA: Diagnosis not present

## 2020-10-21 DIAGNOSIS — K810 Acute cholecystitis: Secondary | ICD-10-CM | POA: Diagnosis not present

## 2020-10-21 DIAGNOSIS — Z87891 Personal history of nicotine dependence: Secondary | ICD-10-CM

## 2020-10-21 DIAGNOSIS — K5190 Ulcerative colitis, unspecified, without complications: Secondary | ICD-10-CM | POA: Diagnosis not present

## 2020-10-21 DIAGNOSIS — R410 Disorientation, unspecified: Secondary | ICD-10-CM | POA: Diagnosis not present

## 2020-10-21 DIAGNOSIS — R4182 Altered mental status, unspecified: Secondary | ICD-10-CM | POA: Diagnosis not present

## 2020-10-21 DIAGNOSIS — K8020 Calculus of gallbladder without cholecystitis without obstruction: Secondary | ICD-10-CM | POA: Diagnosis not present

## 2020-10-21 DIAGNOSIS — Z803 Family history of malignant neoplasm of breast: Secondary | ICD-10-CM

## 2020-10-21 LAB — COMPREHENSIVE METABOLIC PANEL
ALT: 21 U/L (ref 0–44)
AST: 23 U/L (ref 15–41)
Albumin: 3.7 g/dL (ref 3.5–5.0)
Alkaline Phosphatase: 75 U/L (ref 38–126)
Anion gap: 14 (ref 5–15)
BUN: 10 mg/dL (ref 8–23)
CO2: 23 mmol/L (ref 22–32)
Calcium: 9.9 mg/dL (ref 8.9–10.3)
Chloride: 100 mmol/L (ref 98–111)
Creatinine, Ser: 0.64 mg/dL (ref 0.44–1.00)
GFR, Estimated: >60 mL/min (ref >60)
Glucose, Bld: 149 mg/dL — ABNORMAL HIGH (ref 70–99)
Potassium: 2.5 mmol/L — CL (ref 3.5–5.1)
Sodium: 137 mmol/L (ref 135–145)
Total Bilirubin: 0.7 mg/dL (ref 0.3–1.2)
Total Protein: 7.6 g/dL (ref 6.5–8.1)

## 2020-10-21 LAB — CBC
HCT: 40.5 % (ref 36.0–46.0)
Hemoglobin: 14.1 g/dL (ref 12.0–15.0)
MCH: 31.3 pg (ref 26.0–34.0)
MCHC: 34.8 g/dL (ref 30.0–36.0)
MCV: 89.8 fL (ref 80.0–100.0)
Platelets: 352 10*3/uL (ref 150–400)
RBC: 4.51 MIL/uL (ref 3.87–5.11)
RDW: 11.9 % (ref 11.5–15.5)
WBC: 11.7 10*3/uL — ABNORMAL HIGH (ref 4.0–10.5)
nRBC: 0 % (ref 0.0–0.2)

## 2020-10-21 LAB — RESP PANEL BY RT-PCR (FLU A&B, COVID) ARPGX2
Influenza A by PCR: NEGATIVE
Influenza B by PCR: NEGATIVE
SARS Coronavirus 2 by RT PCR: NEGATIVE

## 2020-10-21 LAB — C-REACTIVE PROTEIN: CRP: 1 mg/dL — ABNORMAL HIGH (ref <1.0)

## 2020-10-21 LAB — C DIFFICILE QUICK SCREEN W PCR REFLEX
C Diff antigen: NEGATIVE
C Diff interpretation: NOT DETECTED
C Diff toxin: NEGATIVE

## 2020-10-21 LAB — MAGNESIUM: Magnesium: 1.9 mg/dL (ref 1.7–2.4)

## 2020-10-21 LAB — LIPASE, BLOOD: Lipase: 29 U/L (ref 11–51)

## 2020-10-21 MED ORDER — POTASSIUM CHLORIDE 2 MEQ/ML IV SOLN
INTRAVENOUS | Status: DC
  Filled 2020-10-21 (×2): qty 1000

## 2020-10-21 MED ORDER — POTASSIUM CHLORIDE 2 MEQ/ML IV SOLN: INTRAVENOUS | Status: DC

## 2020-10-21 MED ORDER — ACETAMINOPHEN 325 MG PO TABS
650.0000 mg | ORAL_TABLET | Freq: Four times a day (QID) | ORAL | Status: DC | PRN
  Administered 2020-10-21 – 2020-10-27 (×5): 650 mg via ORAL
  Filled 2020-10-21 (×5): qty 2

## 2020-10-21 MED ORDER — SERTRALINE HCL 50 MG PO TABS
50.0000 mg | ORAL_TABLET | Freq: Every morning | ORAL | Status: DC
  Administered 2020-10-22 – 2020-10-28 (×7): 50 mg via ORAL
  Filled 2020-10-21 (×7): qty 1

## 2020-10-21 MED ORDER — CIPROFLOXACIN IN D5W 400 MG/200ML IV SOLN: 400.0000 mg | Freq: Two times a day (BID) | INTRAVENOUS | Status: DC

## 2020-10-21 MED ORDER — HYDROCORTISONE NA SUCCINATE PF 100 MG IJ SOLR
50.0000 mg | Freq: Three times a day (TID) | INTRAMUSCULAR | Status: DC
  Administered 2020-10-21 – 2020-10-22 (×3): 50 mg via INTRAVENOUS
  Filled 2020-10-21 (×3): qty 2

## 2020-10-21 MED ORDER — POTASSIUM CHLORIDE 10 MEQ/100ML IV SOLN
10.0000 meq | INTRAVENOUS | Status: AC
  Administered 2020-10-21 (×4): 10 meq via INTRAVENOUS
  Filled 2020-10-21 (×4): qty 100

## 2020-10-21 MED ORDER — ONDANSETRON HCL 4 MG/2ML IJ SOLN: 4.0000 mg | Freq: Four times a day (QID) | INTRAMUSCULAR | Status: DC | PRN

## 2020-10-21 MED ORDER — PIPERACILLIN-TAZOBACTAM 3.375 G IVPB
3.3750 g | Freq: Three times a day (TID) | INTRAVENOUS | Status: DC
  Administered 2020-10-22 – 2020-10-25 (×11): 3.375 g via INTRAVENOUS
  Filled 2020-10-21 (×12): qty 50

## 2020-10-21 MED ORDER — SODIUM CHLORIDE 0.9 % IV BOLUS
1000.0000 mL | Freq: Once | INTRAVENOUS | Status: AC
  Administered 2020-10-21: 1000 mL via INTRAVENOUS

## 2020-10-21 MED ORDER — PANTOPRAZOLE SODIUM 40 MG PO TBEC
40.0000 mg | DELAYED_RELEASE_TABLET | Freq: Every day | ORAL | Status: DC
  Administered 2020-10-22 – 2020-10-28 (×7): 40 mg via ORAL
  Filled 2020-10-21 (×7): qty 1

## 2020-10-21 MED ORDER — DOXYCYCLINE HYCLATE 100 MG PO TABS: 100.0000 mg | ORAL_TABLET | Freq: Once | ORAL | Status: DC

## 2020-10-21 MED ORDER — HYDROCODONE-ACETAMINOPHEN 5-325 MG PO TABS
1.0000 | ORAL_TABLET | Freq: Three times a day (TID) | ORAL | Status: DC | PRN
Start: 2020-10-21 — End: 2020-10-23

## 2020-10-21 MED ORDER — PIPERACILLIN-TAZOBACTAM 3.375 G IVPB 30 MIN
3.3750 g | Freq: Once | INTRAVENOUS | Status: AC
  Administered 2020-10-21: 3.375 g via INTRAVENOUS
  Filled 2020-10-21: qty 50

## 2020-10-21 MED ORDER — METRONIDAZOLE 500 MG/100ML IV SOLN: 500.0000 mg | Freq: Three times a day (TID) | INTRAVENOUS | Status: DC

## 2020-10-21 MED ORDER — ATORVASTATIN CALCIUM 20 MG PO TABS
20.0000 mg | ORAL_TABLET | Freq: Every day | ORAL | Status: DC
  Administered 2020-10-22 – 2020-10-23 (×2): 20 mg via ORAL
  Filled 2020-10-21 (×2): qty 1

## 2020-10-21 MED ORDER — IOHEXOL 350 MG/ML SOLN
100.0000 mL | Freq: Once | INTRAVENOUS | Status: AC | PRN
  Administered 2020-10-21: 80 mL via INTRAVENOUS

## 2020-10-21 MED ORDER — ONDANSETRON HCL 4 MG PO TABS
4.0000 mg | ORAL_TABLET | Freq: Four times a day (QID) | ORAL | Status: DC | PRN
Start: 2020-10-21 — End: 2020-10-28

## 2020-10-21 MED ORDER — SODIUM CHLORIDE 0.9% FLUSH
3.0000 mL | Freq: Two times a day (BID) | INTRAVENOUS | Status: DC
  Administered 2020-10-21 – 2020-10-28 (×10): 3 mL via INTRAVENOUS

## 2020-10-21 MED ORDER — LOPERAMIDE HCL 2 MG PO CAPS
2.0000 mg | ORAL_CAPSULE | ORAL | Status: DC | PRN
  Administered 2020-10-22 – 2020-10-26 (×5): 2 mg via ORAL
  Filled 2020-10-21 (×5): qty 1

## 2020-10-21 MED ORDER — ACETAMINOPHEN 650 MG RE SUPP: 650.0000 mg | Freq: Four times a day (QID) | RECTAL | Status: DC | PRN

## 2020-10-21 MED ORDER — ONDANSETRON 4 MG PO TBDP
4.0000 mg | ORAL_TABLET | Freq: Once | ORAL | Status: AC
  Administered 2020-10-21: 4 mg via ORAL
  Filled 2020-10-21: qty 1

## 2020-10-21 MED ORDER — POTASSIUM CHLORIDE 20 MEQ PO PACK
40.0000 meq | PACK | Freq: Every day | ORAL | Status: DC
  Administered 2020-10-21 – 2020-10-22 (×2): 40 meq via ORAL
  Filled 2020-10-21 (×2): qty 2

## 2020-10-21 MED ORDER — APIXABAN 5 MG PO TABS
5.0000 mg | ORAL_TABLET | Freq: Two times a day (BID) | ORAL | Status: DC
  Administered 2020-10-21 – 2020-10-28 (×14): 5 mg via ORAL
  Filled 2020-10-21: qty 1
  Filled 2020-10-21: qty 2
  Filled 2020-10-21 (×12): qty 1

## 2020-10-21 NOTE — ED Triage Notes (Signed)
Patient BIB family, states Covid+ with home test x1 week ago. Family concerned that patient has persistent diarrhea and was confused last night and today. Patient has reportedly had decreased PO intake.

## 2020-10-21 NOTE — ED Provider Notes (Signed)
Emergency Medicine Provider Triage Evaluation Note  [name] , a 87 y.o. female  was evaluated in triage.  Pt complains of patient presents with diarrhea x1 week.  Patient states she been having 5-8 episodes of diarrhea daily, she denies hematemesis or melena and diarrhea.  She has no associated abdominal pain, nausea or vomiting.  No recent hospitalizations, no antibiotic use, no history of C. difficile.  Patient was rediagnosed with COVID.Marland Kitchen  Review of Systems  Positive: Diarrhea, weakness Negative: Abdominal pain, chest pain  Physical Exam  BP (!) 125/57 (BP Location: Left Arm)   Pulse 74   Temp 99.7 F (37.6 C) (Oral)   Resp 14   SpO2 95%  Gen:   Awake, no distress   Resp:  Normal effort  MSK:   Moves extremities without difficulty  Other:  Abdomen soft nontender to palpation nondistended dull to percussion  Medical Decision Making  Medically screening exam initiated at 1:45 PM.  Appropriate orders placed.  [name] was informed that the remainder of the evaluation will be completed by another provider, this initial triage assessment does not replace that evaluation, and the importance of remaining in the ED until their evaluation is complete.  Patient presents with diarrheal illness, lab work has been ordered, patient will need further work-up here in the emergency department.   Marcello Fennel, PA-C 10/21/20 [place], DO 10/21/20 [phone]

## 2020-10-21 NOTE — Progress Notes (Signed)
Pharmacy Antibiotic Note  [name] is a 87 y.o. female admitted on 10/21/2020 with  diverticulitis .  Pharmacy has been consulted for Zosyn dosing.   Plan: Zosyn 3.375 g IV x 1 over 30 minutes then Zosyn 3.375g IV q8h (4 hour infusion). Monitor clinical picture, renal function F/U abx deescalation / LOT      Temp (24hrs), Avg:99.7 F (37.6 C), Min:99.7 F (37.6 C), Max:99.7 F (37.6 C)  Recent Labs  Lab 10/21/20 1343  WBC 11.7*  CREATININE 0.64    CrCl cannot be calculated (Unknown ideal weight.).    Allergies  Allergen Reactions   Arimidex [Anastrozole] Other (See Comments)    Neck cramping   Fish Allergy Other (See Comments)    Patient prefers to NOT eat this   Morphine And Related Nausea And Vomiting   Shellfish Allergy Other (See Comments)    Patient prefers to NOT eat this   Vicodin [Hydrocodone-Acetaminophen] Nausea Only    DOES tolerate Vicodin/Norco, however     Thank you for allowing pharmacy to be a part of this patient's care.  Efraim Kaufmann, PharmD, BCPS 10/21/2020 7:53 PM

## 2020-10-21 NOTE — ED Notes (Signed)
Unable to establish second line. Pt has restricted right arm. Will infuse Potassium first due to critical level and will infuse antibiotics after.

## 2020-10-21 NOTE — H&P (Signed)
History and Physical   [name]:[id number] DOB: [birth year] DOA: 10/21/2020  Referring MD/NP/PA: Dr. Langston Masker, [place] PCP: Mayra Neer, MD  Patient coming from: Home  Chief Complaint: Diarrhea  HPI: [name] is a 87 y.o. female with a history of AFib on eliquis, arthritis, HTN, dementia, and recent home test-positive covid-19 infection who presented to the ED on the advice of her PCP for worsening diarrhea. She lives with her daughter and son-in-law who is currently admitted for covid-19 infection, and has experienced worsening diarrhea associated with poor appetite and fatigue as well as confusion worse than baseline. Diarrhea is 5-8 times per day, nearly all liquid without blood or coffee grounds, worsening over the past several days. She had been started on paxlovid and has taken several days of that for covid, has had a cough but no SOB and no hypoxia (checked regularly by pulse oximetry at home). No wheezing or fever.   ED Course: Several episode of diarrhea, afebrile with AFib w/RVR. Negative C. diff, Cr 0.64, K 2.5, WBC 11.7k. CT A/P revealing distal colonic air-fluid levels consistent with diarrhea and diverticulosis with low grade active inflammation. Antibiotics and IV fluids as well as potassium supplementation were given. Hospitalists called for admission. I have ordered covid testing which is pending.  Review of Systems: No palpitations, and per HPI. All others reviewed and are negative.   Past Medical History:  Diagnosis Date   Arthritis    low spine   Breast cancer ([place]) 2013   right breast   Breast cancer, right breast, recurrent 06/03/2011   Cancer ([place]) 1989   breast right   Chronic back pain    Hypertension    Past Surgical History:  Procedure Laterality Date   ABDOMINAL HYSTERECTOMY  1974   BREAST SURGERY  1989   rt lumpectomy-axillary dissection   COLONOSCOPY     EYE SURGERY     both cataracts   JOINT REPLACEMENT  2004, 2005   both knees     MASTECTOMY Right 06/11/11   right breast   SHOULDER SURGERY     patient does not remember date of procedure   - Remote smoker, no EtOH or drugs.   reports that she quit smoking about 50 years ago. She has never used smokeless tobacco. She reports that she does not drink alcohol and does not use drugs. Allergies  Allergen Reactions   Arimidex [Anastrozole] Other (See Comments)    Neck cramping   Fish Allergy Other (See Comments)    Patient prefers to NOT eat this   Morphine And Related Nausea And Vomiting   Shellfish Allergy Other (See Comments)    Patient prefers to NOT eat this   Vicodin [Hydrocodone-Acetaminophen] Nausea Only    DOES tolerate Vicodin/Norco, however   Family History  Problem Relation Age of Onset   Heart disease Mother    Breast cancer Maternal Aunt    Cancer Brother        unaware   Heart disease Brother    - Family history otherwise reviewed and not pertinent.  Prior to Admission medications   Medication Sig Start Date End Date Taking? Authorizing Provider  acetaminophen (TYLENOL) 325 MG tablet Take 2 tablets (650 mg total) by mouth every 6 (six) hours as needed for mild pain, fever or headache. 11/10/18  Yes Samuella Cota, MD  atorvastatin (LIPITOR) 20 MG tablet Take 20 mg by mouth daily. 08/15/20  Yes [provider]  calcium carbonate (OSCAL) 1500 (600 Ca)  MG TABS tablet Take 600 mg of elemental calcium by mouth daily.   Yes [provider]  diltiazem (TIAZAC) 240 MG 24 hr capsule Take 240 mg by mouth daily.   Yes [provider]  ELIQUIS 5 MG TABS tablet TAKE 1 TABLET BY MOUTH TWICE A DAY Patient taking differently: Take 5 mg by mouth 2 (two) times daily. 07/14/18  Yes Croitoru, Mihai, MD  furosemide (LASIX) 40 MG tablet Take 40 mg by mouth daily as needed for fluid. 10/14/20  Yes [provider]  HYDROcodone-acetaminophen (NORCO/VICODIN) 5-325 MG tablet Take 1 tablet by mouth every 8 (eight) hours as needed for pain or  moderate pain. 01/25/18  Yes [provider]  losartan (COZAAR) 25 MG tablet Take 2 tablets (50 mg total) by mouth daily. NEED OV. Patient taking differently: Take 50 mg by mouth daily. 11/28/18  Yes Kilroy, Doreene Burke, PA-C  Multiple Vitamins-Minerals (CENTRUM SILVER 50+WOMEN PO) Take 1 tablet by mouth daily with breakfast.   Yes [provider]  nitroGLYCERIN (NITROSTAT) 0.4 MG SL tablet Place 1 tablet (0.4 mg total) under the tongue every 5 (five) minutes x 3 doses as needed for chest pain. 09/13/16  Yes Kilroy, Luke K, PA-C  ondansetron (ZOFRAN) 4 MG tablet Take 1 tablet (4 mg total) by mouth every 8 (eight) hours as needed for nausea or vomiting. 10/29/18  Yes Benay Pike, MD  pantoprazole (PROTONIX) 40 MG tablet Take 1 tablet (40 mg total) by mouth daily. 11/11/18  Yes Samuella Cota, MD  predniSONE (DELTASONE) 2.5 MG tablet Take 1.25 mg by mouth daily.  12/27/17  Yes [provider]  sertraline (ZOLOFT) 50 MG tablet Take 50 mg by mouth every morning.  02/04/18  Yes [provider]  vitamin C (ASCORBIC ACID) 500 MG tablet Take 500 mg by mouth daily.   Yes [provider]  VITAMIN E PO Take 1,000 Units by mouth daily.    Yes [provider]  diltiazem (CARDIZEM CD) 120 MG 24 hr capsule TAKE 1 CAPSULE (120 MG TOTAL) BY MOUTH DAILY. PLEASE MAKE OVERDUE APPT Patient not taking: No sig reported 03/06/19   Croitoru, Mihai, MD  diltiazem (CARDIZEM CD) 240 MG 24 hr capsule Take 1 capsule (240 mg total) by mouth daily. Patient not taking: Reported on 10/21/2020 11/11/18   Samuella Cota, MD    Physical Exam: Vitals:   10/21/20 1630 10/21/20 1700 10/21/20 1730 10/21/20 1830  BP: 131/68 135/64 106/74 117/76  Pulse: (!) 102  (!) 101 (!) 101  Resp: 17 (!) 21 16 20   Temp:      TempSrc:      SpO2: 94%  95% 95%   Constitutional: Calm elderly frail female in no distress, calm demeanor Eyes: Lids and conjunctivae normal, PERRL ENMT: Mucous  membranes are dry. Posterior pharynx clear of any exudate or lesions. Poor dentition.  Neck: normal, supple, no masses, no thyromegaly Respiratory: Non-labored breathing room air without accessory muscle use. Clear breath sounds to auscultation bilaterally Cardiovascular: Irreg irreg, rate between 90-110, no murmurs, rubs, or gallops. No carotid bruits. No JVD. RLE pitting edema. Palpable pedal pulses. Abdomen: Normoactive bowel sounds. No tenderness at all, non-distended, and no masses palpated. No hepatosplenomegaly. GU: No indwelling catheter Musculoskeletal: No clubbing / cyanosis. No joint deformity upper and lower extremities. Good ROM, no contractures. Normal muscle tone.  Skin: Warm, dry. No rashes, wounds, or ulcers. No significant lesions noted.  Neurologic: CN II-XII grossly intact. Speech normal. No focal  deficits in motor strength or sensation in all extremities. Psychiatric: Alert and not oriented, requiring frequent redirection. Normal judgment and insight.  Labs on Admission: I have personally reviewed following labs and imaging studies  CBC: Recent Labs  Lab 10/21/20 1343  WBC 11.7*  HGB 14.1  HCT 40.5  MCV 89.8  PLT 379   Basic Metabolic Panel: Recent Labs  Lab 10/21/20 1343  NA 137  K 2.5*  CL 100  CO2 23  GLUCOSE 149*  BUN 10  CREATININE 0.64  CALCIUM 9.9  MG 1.9   GFR: CrCl cannot be calculated (Unknown ideal weight.). Liver Function Tests: Recent Labs  Lab 10/21/20 1343  AST 23  ALT 21  ALKPHOS 75  BILITOT 0.7  PROT 7.6  ALBUMIN 3.7   Recent Labs  Lab 10/21/20 1343  LIPASE 29   Urine analysis:    Component Value Date/Time   COLORURINE YELLOW 11/02/2018 2221   APPEARANCEUR CLOUDY (A) 11/02/2018 2221   LABSPEC 1.016 11/02/2018 2221   PHURINE 6.0 11/02/2018 2221   GLUCOSEU NEGATIVE 11/02/2018 2221   HGBUR NEGATIVE 11/02/2018 2221   [place] NEGATIVE 11/02/2018 [place] 11/02/2018 2221   PROTEINUR NEGATIVE  11/02/2018 2221   UROBILINOGEN 0.2 08/02/2007 1310   NITRITE NEGATIVE 11/02/2018 2221   LEUKOCYTESUR LARGE (A) 11/02/2018 2221    Recent Results (from the past 240 hour(s))  C Difficile Quick Screen w PCR reflex     Status: None   Collection Time: 10/21/20  4:09 PM   Specimen: Stool  Result Value Ref Range Status   C Diff antigen NEGATIVE NEGATIVE Final   C Diff toxin NEGATIVE NEGATIVE Final   C Diff interpretation No C. difficile detected.  Final    Comment: Performed at [facility], [place] [address] [place], [place] [address]     Radiological Exams on Admission: CT ABDOMEN PELVIS W CONTRAST  Result Date: 10/21/2020 CLINICAL DATA:  Diarrhea. COVID infection. Possible diverticulitis. EXAM: CT ABDOMEN AND PELVIS WITH CONTRAST TECHNIQUE: Multidetector CT imaging of the abdomen and pelvis was performed using the standard protocol following bolus administration of intravenous contrast. CONTRAST:  40mL OMNIPAQUE IOHEXOL 350 MG/ML SOLN COMPARISON:  11/03/2018 FINDINGS: Despite efforts by the technologist and patient, motion artifact is present on today's exam and could not be eliminated. This reduces exam sensitivity and specificity. Lower chest: Large hiatal hernia, only partially included on today CT of the abdomen. Right coronary artery atherosclerotic calcification along with mitral valve calcification. Descending thoracic aortic atherosclerosis. Hepatobiliary: Dependent density in the gallbladder favors a small gallstone. Otherwise unremarkable. Pancreas: Unremarkable Spleen: Scarring in the upper spleen, unchanged. Adrenals/Urinary Tract: Exophytic hypodense 1.8 by 1.2 cm fluid density lesion favoring cyst from the right kidney lower pole, image 34 series 2. Urinary bladder unremarkable. The adrenal glands appear normal. 6 mm hypodense lesion of the left kidney upper pole is likely a small cyst but technically too small to characterize. Stomach/Bowel: Large hiatal hernia.  Periampullary and transverse duodenal diverticula. Fluid levels in the distal colon and rectum indicating diarrheal process. Sigmoid colon diverticulosis with equivocal diverticulitis distally in the sigmoid colon. No extraluminal gas or abscess. Most of the colon is collapsed/nondistended. Appendix not visualized. Vascular/Lymphatic: Aortoiliac atherosclerotic vascular disease. Reproductive: Uterus absent.  Adnexa unremarkable. Other: No supplemental non-categorized findings. Musculoskeletal: Lumbar spondylosis and degenerative disc disease mild levoconvex lumbar scoliosis. Interbody fusion at T12-L1. IMPRESSION: 1. Sigmoid diverticulosis likely with low-grade active diverticulitis in the distal sigmoid colon. Air fluid levels in  the distal colon and rectum indicate diarrheal process. 2. Chronic large hiatal hernia. 3. Other imaging findings of potential clinical significance: Cholelithiasis. Aortic Atherosclerosis (ICD10-I70.0). Lumbar spondylosis and degenerative disc disease. Electronically Signed   By: Van Clines M.D.   On: 10/21/2020 17:17    EKG: Independently reviewed. Atrial flutter misread as NSR. No ST deviations.  Assessment/Plan Principal Problem:   Diverticulitis Active Problems:   Chronic anticoagulation   Acute encephalopathy   Atrial fibrillation, chronic (HCC)   Dehydration   Goals of care, counseling/discussion   Colitis   Acute sigmoid diverticulitis: Noted on CT without complication.  - Continue antibiotics, change cipro/flagyl to zosyn - Not taking po, but can give clears if amenable.  Diarrhea, dehydration, hypokalemia:  - Supplement potassium, continue IVF. Slightly hypotensive and no evidence of volume overload at this time, will bolus IVF x1.  - C. diff negative, exam benign, will give imodium to limit GI losses.  Covid-19 infection: Concern that diarrhea is due to diverticulitis, covid-19 itself as well, but also likely attributable to paxlovid, which likely  also contributed to poor per oral intake/dysgeusia. Not lymphopenic. - Stop paxlovid.  - Covid test itself here is still pending. Will need to complete 10 days of isolation regardless.  - Check CXR (has cough, no dyspnea or hypoxia) - Check CRP.   Chronic atrial fibrillation now with RVR:  - BP may not support restart diltiazem at this time. Will continue IVF replacement as above and monitor on telemetry. - Continue eliquis  GERD, hiatal hernia:  - Continue PPI - Consider SLP evaluation.   Arthritis on chronic steroids: Prednisone maintenance dose 1.25mg  daily.  - Stress steroids for now with borderline hypotension.   HTN:  - Hold losartan  Acute metabolic encephalopathy on chronic dementia:  - Supportive care, anticipate hospital delirium and minimize with precautions. Daughter's presence would be helpful.   RLE edema: Chronic and actually improved from baseline.  - Holding lasix. Continuing anticoagulation.   DVT prophylaxis: Eliquis  Code Status: DNR confirmed on admission  Family Communication: Daughter at bedside Disposition Plan: Return home currently planned Consults called: None  Admission status: Observation    Patrecia Pour, MD [facility] [email or web address] 10/21/2020, 6:46 PM

## 2020-10-21 NOTE — ED Provider Notes (Signed)
[facility] DEPT Provider Note   CSN: [id number] Arrival date & time: 10/21/20  1233     History Chief Complaint  Patient presents with   Diarrhea    BLIMI [name] is a 87 y.o. female.  87 yo F with chief complaints of diarrhea.  Going on for about a week.  Having 5-8 episodes of loose stools a day.  Having some decreased urinary output as well.  No noted fevers or chills no cough or congestion.  Has been reporting some abdominal pain to the family.  Patient is confused on exam and unable provide much history.  Level 5 caveat.   Diarrhea     Past Medical History:  Diagnosis Date   Arthritis    low spine   Breast cancer ([place]) 2013   right breast   Breast cancer, right breast, recurrent 06/03/2011   Cancer ([place]) 1989   breast right   Chronic back pain    Hypertension     Patient Active Problem List   Diagnosis Date Noted   Colitis 10/21/2020   Diverticulitis 10/21/2020   DNR (do not resuscitate) 10/21/2020   Abdominal discomfort, epigastric    Goals of care, counseling/discussion    Palliative care by specialist    Abdominal pain 11/02/2018   Nausea and vomiting 11/02/2018   Dehydration 11/02/2018   Dyslipidemia 11/02/2018   Chronic back pain 11/02/2018   Acute cholecystitis 11/02/2018   Cholelithiasis 11/01/2018   Atrial fibrillation, chronic ([place]) 10/18/2018   Gallstones 10/18/2018   Acute encephalopathy 10/17/2018   Atrial fibrillation, new onset ([place]) 09/13/2016   Chronic anticoagulation 09/13/2016   Essential hypertension 09/13/2016   Chest pain with moderate risk of acute coronary syndrome 09/12/2016   Breast cancer, right breast, recurrent 06/03/2011   Personal history of malignant neoplasm of breast 04/14/1987    Past Surgical History:  Procedure Laterality Date   [place]   rt lumpectomy-axillary dissection   COLONOSCOPY     EYE SURGERY     both cataracts   JOINT  REPLACEMENT  2004, 2005   both knees    MASTECTOMY Right 06/11/11   right breast   SHOULDER SURGERY     patient does not remember date of procedure     OB History   No obstetric history on file.     Family History  Problem Relation Age of Onset   Heart disease Mother    Breast cancer Maternal Aunt    Cancer Brother        unaware   Heart disease Brother     Social History   Tobacco Use   Smoking status: Former    Pack years: 0.00    Types: Cigarettes    Quit date: 05/11/1970    Years since quitting: 50.4   Smokeless tobacco: Never  Vaping Use   Vaping Use: Never used  Substance Use Topics   Alcohol use: No   Drug use: No    Home Medications Prior to Admission medications   Medication Sig Start Date End Date Taking? Authorizing Provider  acetaminophen (TYLENOL) 325 MG tablet Take 2 tablets (650 mg total) by mouth every 6 (six) hours as needed for mild pain, fever or headache. 11/10/18  Yes Samuella Cota, MD  atorvastatin (LIPITOR) 20 MG tablet Take 20 mg by mouth daily. 08/15/20  Yes [provider]  calcium carbonate (OSCAL) 1500 (600 Ca) MG TABS tablet Take 600 mg of  elemental calcium by mouth daily.   Yes [provider]  diltiazem (TIAZAC) 240 MG 24 hr capsule Take 240 mg by mouth daily.   Yes [provider]  ELIQUIS 5 MG TABS tablet TAKE 1 TABLET BY MOUTH TWICE A DAY Patient taking differently: Take 5 mg by mouth 2 (two) times daily. 07/14/18  Yes Croitoru, Mihai, MD  furosemide (LASIX) 40 MG tablet Take 40 mg by mouth daily as needed for fluid. 10/14/20  Yes [provider]  HYDROcodone-acetaminophen (NORCO/VICODIN) 5-325 MG tablet Take 1 tablet by mouth every 8 (eight) hours as needed for pain or moderate pain. 01/25/18  Yes [provider]  losartan (COZAAR) 25 MG tablet Take 2 tablets (50 mg total) by mouth daily. NEED OV. Patient taking differently: Take 50 mg by mouth daily. 11/28/18  Yes Kilroy, Doreene Burke, PA-C   Multiple Vitamins-Minerals (CENTRUM SILVER 50+WOMEN PO) Take 1 tablet by mouth daily with breakfast.   Yes [provider]  nitroGLYCERIN (NITROSTAT) 0.4 MG SL tablet Place 1 tablet (0.4 mg total) under the tongue every 5 (five) minutes x 3 doses as needed for chest pain. 09/13/16  Yes Kilroy, Luke K, PA-C  ondansetron (ZOFRAN) 4 MG tablet Take 1 tablet (4 mg total) by mouth every 8 (eight) hours as needed for nausea or vomiting. 10/29/18  Yes Benay Pike, MD  pantoprazole (PROTONIX) 40 MG tablet Take 1 tablet (40 mg total) by mouth daily. 11/11/18  Yes Samuella Cota, MD  predniSONE (DELTASONE) 2.5 MG tablet Take 1.25 mg by mouth daily.  12/27/17  Yes [provider]  sertraline (ZOLOFT) 50 MG tablet Take 50 mg by mouth every morning.  02/04/18  Yes [provider]  vitamin C (ASCORBIC ACID) 500 MG tablet Take 500 mg by mouth daily.   Yes [provider]  VITAMIN E PO Take 1,000 Units by mouth daily.    Yes [provider]  diltiazem (CARDIZEM CD) 120 MG 24 hr capsule TAKE 1 CAPSULE (120 MG TOTAL) BY MOUTH DAILY. PLEASE MAKE OVERDUE APPT Patient not taking: No sig reported 03/06/19   Croitoru, Mihai, MD  diltiazem (CARDIZEM CD) 240 MG 24 hr capsule Take 1 capsule (240 mg total) by mouth daily. Patient not taking: Reported on 10/21/2020 11/11/18   Samuella Cota, MD    Allergies    Arimidex [anastrozole], Fish allergy, Morphine and related, Shellfish allergy, and Vicodin [hydrocodone-acetaminophen]  Review of Systems   Review of Systems  Unable to perform ROS: Dementia  Gastrointestinal:  Positive for diarrhea.   Physical Exam Updated Vital Signs BP (!) 158/71 (BP Location: Right Arm)   Pulse 82   Temp 99.2 F (37.3 C) (Oral)   Resp 16   Ht 5' (1.524 m)   Wt 73.3 kg   SpO2 97%   BMI 31.58 kg/m   Physical Exam Vitals and nursing note reviewed.  Constitutional:      General: She is not in acute distress.    Appearance: She is  well-developed. She is not diaphoretic.  HENT:     Head: Normocephalic and atraumatic.  Eyes:     Pupils: Pupils are equal, round, and reactive to light.  Cardiovascular:     Rate and Rhythm: Normal rate and regular rhythm.     Heart sounds: No murmur heard.   No friction rub. No gallop.  Pulmonary:     Effort: Pulmonary effort is normal.     Breath sounds: No wheezing or rales.  Abdominal:  General: There is no distension.     Palpations: Abdomen is soft.     Tenderness: There is no abdominal tenderness.  Musculoskeletal:        General: No tenderness.     Cervical back: Normal range of motion and neck supple.  Skin:    General: Skin is warm and dry.  Neurological:     Mental Status: She is alert.     Comments: Pleasantly demented  Psychiatric:        Behavior: Behavior normal.    ED Results / Procedures / Treatments   Labs (all labs ordered are listed, but only abnormal results are displayed) Labs Reviewed  CBC - Abnormal; Notable for the following components:      Result Value   WBC 11.7 (*)    All other components within normal limits  COMPREHENSIVE METABOLIC PANEL - Abnormal; Notable for the following components:   Potassium 2.5 (*)    Glucose, Bld 149 (*)    All other components within normal limits  C-REACTIVE PROTEIN - Abnormal; Notable for the following components:   CRP 1.0 (*)    All other components within normal limits  CBC - Abnormal; Notable for the following components:   RBC 3.78 (*)    Hemoglobin 11.9 (*)    HCT 35.0 (*)    All other components within normal limits  BASIC METABOLIC PANEL - Abnormal; Notable for the following components:   Potassium 3.0 (*)    Glucose, Bld 165 (*)    BUN 6 (*)    Calcium 8.5 (*)    All other components within normal limits  MAGNESIUM - Abnormal; Notable for the following components:   Magnesium 1.5 (*)    All other components within normal limits  GASTROINTESTINAL PANEL BY PCR, STOOL (REPLACES STOOL  CULTURE)  C DIFFICILE QUICK SCREEN W PCR REFLEX    RESP PANEL BY RT-PCR (FLU A&B, COVID) [id number]  LIPASE, BLOOD  MAGNESIUM  C-REACTIVE PROTEIN    EKG EKG Interpretation  Date/Time:  Monday October 21 2020 15:39:20 EDT Ventricular Rate:  98 PR Interval:    QRS Duration: 90 QT Interval:  340 QTC Calculation: 434 R Axis:   -38 Text Interpretation: Normal sinus rhythm Left axis deviation Moderate voltage criteria for LVH, may be normal variant ( R in aVL , Cornell product ) Anterior infarct , age undetermined Abnormal ECG No significant change since last tracing Confirmed by Deno Etienne [phone]) on 10/21/2020 3:59:41 PM  Radiology CT ABDOMEN PELVIS W CONTRAST  Result Date: 10/21/2020 CLINICAL DATA:  Diarrhea. COVID infection. Possible diverticulitis. EXAM: CT ABDOMEN AND PELVIS WITH CONTRAST TECHNIQUE: Multidetector CT imaging of the abdomen and pelvis was performed using the standard protocol following bolus administration of intravenous contrast. CONTRAST:  56mL OMNIPAQUE IOHEXOL 350 MG/ML SOLN COMPARISON:  11/03/2018 FINDINGS: Despite efforts by the technologist and patient, motion artifact is present on today's exam and could not be eliminated. This reduces exam sensitivity and specificity. Lower chest: Large hiatal hernia, only partially included on today CT of the abdomen. Right coronary artery atherosclerotic calcification along with mitral valve calcification. Descending thoracic aortic atherosclerosis. Hepatobiliary: Dependent density in the gallbladder favors a small gallstone. Otherwise unremarkable. Pancreas: Unremarkable Spleen: Scarring in the upper spleen, unchanged. Adrenals/Urinary Tract: Exophytic hypodense 1.8 by 1.2 cm fluid density lesion favoring cyst from the right kidney lower pole, image 34 series 2. Urinary bladder unremarkable. The adrenal glands appear normal. 6 mm hypodense lesion of the left kidney upper pole is  likely a small cyst but technically too small to characterize.  Stomach/Bowel: Large hiatal hernia. Periampullary and transverse duodenal diverticula. Fluid levels in the distal colon and rectum indicating diarrheal process. Sigmoid colon diverticulosis with equivocal diverticulitis distally in the sigmoid colon. No extraluminal gas or abscess. Most of the colon is collapsed/nondistended. Appendix not visualized. Vascular/Lymphatic: Aortoiliac atherosclerotic vascular disease. Reproductive: Uterus absent.  Adnexa unremarkable. Other: No supplemental non-categorized findings. Musculoskeletal: Lumbar spondylosis and degenerative disc disease mild levoconvex lumbar scoliosis. Interbody fusion at T12-L1. IMPRESSION: 1. Sigmoid diverticulosis likely with low-grade active diverticulitis in the distal sigmoid colon. Air fluid levels in the distal colon and rectum indicate diarrheal process. 2. Chronic large hiatal hernia. 3. Other imaging findings of potential clinical significance: Cholelithiasis. Aortic Atherosclerosis (ICD10-I70.0). Lumbar spondylosis and degenerative disc disease. Electronically Signed   By: Van Clines M.D.   On: 10/21/2020 17:17   DG CHEST PORT 1 VIEW  Result Date: 10/21/2020 CLINICAL DATA:  COVID positive, diarrhea and confusion EXAM: PORTABLE CHEST 1 VIEW COMPARISON:  November 03, 2018. FINDINGS: The heart size and mediastinal contours are within normal limits. Both lungs are clear. The visualized skeletal structures are unremarkable. Surgical clips overlie the right axilla. IMPRESSION: No acute cardiopulmonary disease. Electronically Signed   By: Dahlia Bailiff MD   On: 10/21/2020 20:23    Procedures Procedures   Medications Ordered in ED Medications  hydrocortisone sodium succinate (SOLU-CORTEF) 100 MG injection 50 mg (50 mg Intravenous Given 10/22/20 1358)  HYDROcodone-acetaminophen (NORCO/VICODIN) 5-325 MG per tablet 1 tablet (has no administration in time range)  atorvastatin (LIPITOR) tablet 20 mg (20 mg Oral Given 10/22/20 1028)   sertraline (ZOLOFT) tablet 50 mg (50 mg Oral Given 10/22/20 1028)  pantoprazole (PROTONIX) EC tablet 40 mg (40 mg Oral Given 10/22/20 1027)  apixaban (ELIQUIS) tablet 5 mg (5 mg Oral Given 10/22/20 1028)  sodium chloride flush (NS) 0.9 % injection 3 mL (3 mLs Intravenous Given 10/22/20 1029)  acetaminophen (TYLENOL) tablet 650 mg (650 mg Oral Given 10/21/20 2343)    Or  acetaminophen (TYLENOL) suppository 650 mg ( Rectal See Alternative 10/21/20 2343)  ondansetron (ZOFRAN) tablet 4 mg (has no administration in time range)    Or  ondansetron (ZOFRAN) injection 4 mg (has no administration in time range)  loperamide (IMODIUM) capsule 2 mg (2 mg Oral Given 10/22/20 1028)  piperacillin-tazobactam (ZOSYN) IVPB 3.375 g (3.375 g Intravenous New Bag/Given 10/22/20 1402)  sodium chloride 0.45 % 1,000 mL with potassium chloride 40 mEq infusion ( Intravenous New Bag/Given 10/22/20 1400)  sodium chloride 0.9 % bolus 1,000 mL (0 mLs Intravenous Stopped 10/21/20 1723)  ondansetron (ZOFRAN-ODT) disintegrating tablet 4 mg (4 mg Oral Given 10/21/20 1523)  potassium chloride 10 mEq in 100 mL IVPB (0 mEq Intravenous Stopped 10/22/20 0808)  iohexol (OMNIPAQUE) 350 MG/ML injection 100 mL (80 mLs Intravenous Contrast Given 10/21/20 1623)  piperacillin-tazobactam (ZOSYN) IVPB 3.375 g (0 g Intravenous Stopped 10/22/20 0808)  magnesium sulfate IVPB 2 g 50 mL (0 g Intravenous Stopped 10/22/20 1405)    ED Course  I have reviewed the triage vital signs and the nursing notes.  Pertinent labs & imaging results that were available during my care of the patient were reviewed by me and considered in my medical decision making (see chart for details).    MDM Rules/Calculators/A&P                          87 yo F with a chief  complaints of diarrhea going over the past week he has been getting more weak and confused from her baseline.  Lives with her daughter and has had some trouble getting around at home.  Call their family doctor  who suggested he come here for evaluation.  Blood work with hypokalemia 2.5.  Will start replacement here but likely needs admission.  Family is endorsing abdominal pain though no obvious pain on exam.  Will obtain a CT.  Signed out to Dr. Langston Masker, please see their note for further details of care in the ED.  The patients results and plan were reviewed and discussed.   Any x-rays performed were independently reviewed by myself.   Differential diagnosis were considered with the presenting HPI.  Medications  hydrocortisone sodium succinate (SOLU-CORTEF) 100 MG injection 50 mg (50 mg Intravenous Given 10/22/20 1358)  HYDROcodone-acetaminophen (NORCO/VICODIN) 5-325 MG per tablet 1 tablet (has no administration in time range)  atorvastatin (LIPITOR) tablet 20 mg (20 mg Oral Given 10/22/20 1028)  sertraline (ZOLOFT) tablet 50 mg (50 mg Oral Given 10/22/20 1028)  pantoprazole (PROTONIX) EC tablet 40 mg (40 mg Oral Given 10/22/20 1027)  apixaban (ELIQUIS) tablet 5 mg (5 mg Oral Given 10/22/20 1028)  sodium chloride flush (NS) 0.9 % injection 3 mL (3 mLs Intravenous Given 10/22/20 1029)  acetaminophen (TYLENOL) tablet 650 mg (650 mg Oral Given 10/21/20 2343)    Or  acetaminophen (TYLENOL) suppository 650 mg ( Rectal See Alternative 10/21/20 2343)  ondansetron (ZOFRAN) tablet 4 mg (has no administration in time range)    Or  ondansetron (ZOFRAN) injection 4 mg (has no administration in time range)  loperamide (IMODIUM) capsule 2 mg (2 mg Oral Given 10/22/20 1028)  piperacillin-tazobactam (ZOSYN) IVPB 3.375 g (3.375 g Intravenous New Bag/Given 10/22/20 1402)  sodium chloride 0.45 % 1,000 mL with potassium chloride 40 mEq infusion ( Intravenous New Bag/Given 10/22/20 1400)  sodium chloride 0.9 % bolus 1,000 mL (0 mLs Intravenous Stopped 10/21/20 1723)  ondansetron (ZOFRAN-ODT) disintegrating tablet 4 mg (4 mg Oral Given 10/21/20 1523)  potassium chloride 10 mEq in 100 mL IVPB (0 mEq Intravenous Stopped 10/22/20  0808)  iohexol (OMNIPAQUE) 350 MG/ML injection 100 mL (80 mLs Intravenous Contrast Given 10/21/20 1623)  piperacillin-tazobactam (ZOSYN) IVPB 3.375 g (0 g Intravenous Stopped 10/22/20 0808)  magnesium sulfate IVPB 2 g 50 mL (0 g Intravenous Stopped 10/22/20 1405)    Vitals:   10/21/20 2102 10/22/20 0100 10/22/20 0505 10/22/20 1234  BP: (!) 143/63 135/76 (!) 148/60 (!) 158/71  Pulse: 87 97 85 82  Resp: 20 18 20 16   Temp: 97.8 F (36.6 C) 98.6 F (37 C) 97.9 F (36.6 C) 99.2 F (37.3 C)  TempSrc: Oral Oral Oral Oral  SpO2: 96% 95% 98% 97%  Weight: 73.3 kg     Height: 5' (1.524 m)       Final diagnoses:  Cough  COVID-19    Admission/ observation were discussed with the admitting physician, patient and/or family and they are comfortable with the plan.   Final Clinical Impression(s) / ED Diagnoses Final diagnoses:  Cough  COVID-19    Rx / DC Orders ED Discharge Orders     None        Deno Etienne, DO 10/22/20 1601

## 2020-10-22 ENCOUNTER — Other Ambulatory Visit: Payer: Self-pay

## 2020-10-22 DIAGNOSIS — I482 Chronic atrial fibrillation, unspecified: Secondary | ICD-10-CM | POA: Diagnosis present

## 2020-10-22 DIAGNOSIS — G8929 Other chronic pain: Secondary | ICD-10-CM | POA: Diagnosis present

## 2020-10-22 DIAGNOSIS — E876 Hypokalemia: Secondary | ICD-10-CM | POA: Diagnosis present

## 2020-10-22 DIAGNOSIS — K5792 Diverticulitis of intestine, part unspecified, without perforation or abscess without bleeding: Secondary | ICD-10-CM | POA: Diagnosis not present

## 2020-10-22 DIAGNOSIS — Z853 Personal history of malignant neoplasm of breast: Secondary | ICD-10-CM | POA: Diagnosis not present

## 2020-10-22 DIAGNOSIS — M549 Dorsalgia, unspecified: Secondary | ICD-10-CM | POA: Diagnosis present

## 2020-10-22 DIAGNOSIS — Z8616 Personal history of COVID-19: Secondary | ICD-10-CM | POA: Diagnosis not present

## 2020-10-22 DIAGNOSIS — I10 Essential (primary) hypertension: Secondary | ICD-10-CM | POA: Diagnosis present

## 2020-10-22 DIAGNOSIS — K5732 Diverticulitis of large intestine without perforation or abscess without bleeding: Secondary | ICD-10-CM | POA: Diagnosis present

## 2020-10-22 DIAGNOSIS — Z7901 Long term (current) use of anticoagulants: Secondary | ICD-10-CM | POA: Diagnosis not present

## 2020-10-22 DIAGNOSIS — R600 Localized edema: Secondary | ICD-10-CM | POA: Diagnosis present

## 2020-10-22 DIAGNOSIS — K219 Gastro-esophageal reflux disease without esophagitis: Secondary | ICD-10-CM | POA: Diagnosis present

## 2020-10-22 DIAGNOSIS — Z90710 Acquired absence of both cervix and uterus: Secondary | ICD-10-CM | POA: Diagnosis not present

## 2020-10-22 DIAGNOSIS — Z20822 Contact with and (suspected) exposure to covid-19: Secondary | ICD-10-CM | POA: Diagnosis present

## 2020-10-22 DIAGNOSIS — Z66 Do not resuscitate: Secondary | ICD-10-CM | POA: Diagnosis present

## 2020-10-22 DIAGNOSIS — Z888 Allergy status to other drugs, medicaments and biological substances status: Secondary | ICD-10-CM | POA: Diagnosis not present

## 2020-10-22 DIAGNOSIS — R197 Diarrhea, unspecified: Secondary | ICD-10-CM | POA: Diagnosis present

## 2020-10-22 DIAGNOSIS — M47816 Spondylosis without myelopathy or radiculopathy, lumbar region: Secondary | ICD-10-CM | POA: Diagnosis present

## 2020-10-22 DIAGNOSIS — E860 Dehydration: Secondary | ICD-10-CM | POA: Diagnosis present

## 2020-10-22 DIAGNOSIS — G9341 Metabolic encephalopathy: Secondary | ICD-10-CM | POA: Diagnosis present

## 2020-10-22 DIAGNOSIS — F0390 Unspecified dementia without behavioral disturbance: Secondary | ICD-10-CM | POA: Diagnosis present

## 2020-10-22 DIAGNOSIS — K449 Diaphragmatic hernia without obstruction or gangrene: Secondary | ICD-10-CM | POA: Diagnosis present

## 2020-10-22 DIAGNOSIS — Z96653 Presence of artificial knee joint, bilateral: Secondary | ICD-10-CM | POA: Diagnosis present

## 2020-10-22 DIAGNOSIS — Z91013 Allergy to seafood: Secondary | ICD-10-CM | POA: Diagnosis not present

## 2020-10-22 DIAGNOSIS — K529 Noninfective gastroenteritis and colitis, unspecified: Secondary | ICD-10-CM | POA: Diagnosis present

## 2020-10-22 DIAGNOSIS — G9340 Encephalopathy, unspecified: Secondary | ICD-10-CM | POA: Diagnosis not present

## 2020-10-22 DIAGNOSIS — Z885 Allergy status to narcotic agent status: Secondary | ICD-10-CM | POA: Diagnosis not present

## 2020-10-22 LAB — CBC
HCT: 35 % — ABNORMAL LOW (ref 36.0–46.0)
Hemoglobin: 11.9 g/dL — ABNORMAL LOW (ref 12.0–15.0)
MCH: 31.5 pg (ref 26.0–34.0)
MCHC: 34 g/dL (ref 30.0–36.0)
MCV: 92.6 fL (ref 80.0–100.0)
Platelets: 272 10*3/uL (ref 150–400)
RBC: 3.78 MIL/uL — ABNORMAL LOW (ref 3.87–5.11)
RDW: 12.1 % (ref 11.5–15.5)
WBC: 10.3 10*3/uL (ref 4.0–10.5)
nRBC: 0 % (ref 0.0–0.2)

## 2020-10-22 LAB — BASIC METABOLIC PANEL
Anion gap: 7 (ref 5–15)
BUN: 6 mg/dL — ABNORMAL LOW (ref 8–23)
CO2: 26 mmol/L (ref 22–32)
Calcium: 8.5 mg/dL — ABNORMAL LOW (ref 8.9–10.3)
Chloride: 108 mmol/L (ref 98–111)
Creatinine, Ser: 0.51 mg/dL (ref 0.44–1.00)
GFR, Estimated: >60 mL/min (ref >60)
Glucose, Bld: 165 mg/dL — ABNORMAL HIGH (ref 70–99)
Potassium: 3 mmol/L — ABNORMAL LOW (ref 3.5–5.1)
Sodium: 141 mmol/L (ref 135–145)

## 2020-10-22 LAB — GASTROINTESTINAL PANEL BY PCR, STOOL (REPLACES STOOL CULTURE)

## 2020-10-22 LAB — C-REACTIVE PROTEIN: CRP: 0.8 mg/dL (ref <1.0)

## 2020-10-22 LAB — MAGNESIUM: Magnesium: 1.5 mg/dL — ABNORMAL LOW (ref 1.7–2.4)

## 2020-10-22 MED ORDER — DILTIAZEM HCL ER COATED BEADS 240 MG PO CP24
240.0000 mg | ORAL_CAPSULE | Freq: Every day | ORAL | Status: DC
  Administered 2020-10-22 – 2020-10-28 (×7): 240 mg via ORAL
  Filled 2020-10-22 (×7): qty 1

## 2020-10-22 MED ORDER — SODIUM CHLORIDE 0.45 % IV SOLN
INTRAVENOUS | Status: DC
  Filled 2020-10-22 (×8): qty 1000

## 2020-10-22 MED ORDER — PREDNISONE 2.5 MG PO TABS
1.2500 mg | ORAL_TABLET | Freq: Every day | ORAL | Status: DC
  Administered 2020-10-23 – 2020-10-28 (×6): 1.25 mg via ORAL
  Filled 2020-10-22 (×6): qty 0.5

## 2020-10-22 MED ORDER — MAGNESIUM SULFATE 2 GM/50ML IV SOLN
2.0000 g | Freq: Once | INTRAVENOUS | Status: AC
  Administered 2020-10-22: 2 g via INTRAVENOUS
  Filled 2020-10-22: qty 50

## 2020-10-22 NOTE — Evaluation (Addendum)
Occupational Therapy Evaluation Patient Details Name: [name] MRN: [id number] DOB: [birth year] Today's Date: 10/22/2020    History of Present Illness patient is a 87 year old female who presented to the hosptial with worsening diarrhea. patient was found to have COVID 19,AFib w/RVR, and Acute sigmoid diverticulitis. BJS:EGBT on eliquis, arthritis, HTN, dementia   Clinical Impression   Patient is a confused 87 year old who was noted to have a significant decline in the ability to participate in ADL tasks. Patient was unable to give PLOF on this date. Will need to follow up with family. Patient currently requires mod A for supine to sit on edge of bed with increased education on proper hand and foot placement to avoid lines. Patient was easily distracted and fatigued with limited activity.patient was max A for sit to stand with rolling walker with min A for remaining upright with posterior leaning noted. Patient unable to follow commands to turn to 3 in 1 commode on this date. Patient was distracted by PPE on therapist.  Patient was noted to have BM incontinence episode when standing with TD for hygiene tasks. Patient was max A for repositioning in bed with use of bed controls. Nursing made aware of patients incontinence episode.  Patient would continue to benefit from skilled OT services at this time while admitted and after d/c to address noted deficits in order to improve overall safety and independence in ADLs.      Follow Up Recommendations  SNF   Equipment Recommendations  3 in 1 bedside commode    Recommendations for Other Services       Precautions / Restrictions Precautions Precautions: Fall Restrictions Weight Bearing Restrictions: No      Mobility Bed Mobility Overal bed mobility: Needs Assistance Bed Mobility: Supine to Sit     Supine to sit: Mod assist     General bed mobility comments: with education for proper hand and foot placement. patient required  assistance for BLE and trunk to transition into sitting position.    Transfers Overall transfer level: Needs assistance Equipment used: Rolling walker (2 wheeled) Transfers: Sit to/from Stand Sit to Stand: Max assist         General transfer comment: once in standing patient was noted to need min A for postierior leaning.    Balance Overall balance assessment: Needs assistance Sitting-balance support: Bilateral upper extremity supported Sitting balance-Leahy Scale: Fair     Standing balance support: Bilateral upper extremity supported Standing balance-Leahy Scale: Poor Standing balance comment: posterior leaning with BUE on walker with min A to maintain upright standing.                           ADL either performed or assessed with clinical judgement   ADL Overall ADL's : Needs assistance/impaired Eating/Feeding: Set up;Sitting   Grooming: Wash/dry face;Oral care;Set up;Minimal assistance;Sitting   Upper Body Bathing: Moderate assistance;Sitting   Lower Body Bathing: Maximal assistance;Sitting/lateral leans   Upper Body Dressing : Moderate assistance;Sitting   Lower Body Dressing: Maximal assistance;Sitting/lateral leans   Toilet Transfer: Maximal assistance;RW;BSC   Toileting- Clothing Manipulation and Hygiene: Total assistance;Sit to/from stand Toileting - Clothing Manipulation Details (indicate cue type and reason): patient was noted to have posterior leaning in standing with min A to maintain standing balance. patient unable to assist with hygiene tasks.       General ADL Comments: patient required increased cues for all tasks with patient easily distracted.  Vision   Additional Comments: patient had a hard time following all cues during session. ujnable to assess vision formally at this time. patient was distracted by all PPE therapist was wearing.                Pertinent Vitals/Pain Pain Assessment: Faces Faces Pain Scale: Hurts  little more Pain Location: abdomen Pain Descriptors / Indicators: Grimacing;Guarding Pain Intervention(s): Limited activity within patient's tolerance;Monitored during session     Hand Dominance Right   Extremity/Trunk Assessment Upper Extremity Assessment Upper Extremity Assessment: Generalized weakness   Lower Extremity Assessment Lower Extremity Assessment: Defer to PT evaluation   Cervical / Trunk Assessment Cervical / Trunk Assessment: Normal   Communication Communication Communication: HOH   Cognition Arousal/Alertness: Awake/alert Behavior During Therapy: Flat affect Overall Cognitive Status: Difficult to assess                                 General Comments: patient had a hard time hearing and answering questions. patient is noted to have dementia in history.   General Comments                  Home Living Family/patient expects to be discharged to:: Private residence Living Arrangements: Children Available Help at Discharge: Family;Available 24 hours/day Type of Home: Other(Comment) (patient was unable to give answers as to where she was living. hosptial notes say patient was living at home with son and daughter in law.)                                  Prior Functioning/Environment                   OT Problem List: Decreased strength;Impaired balance (sitting and/or standing);Decreased cognition;Decreased safety awareness;Decreased activity tolerance;Decreased coordination      OT Treatment/Interventions: Self-care/ADL training;DME and/or AE instruction;Therapeutic activities;Balance training;Therapeutic exercise;Patient/family education    OT Goals(Current goals can be found in the care plan section) Acute Rehab OT Goals Patient Stated Goal: "hi" OT Goal Formulation: With patient Time For Goal Achievement: 11/05/20 Potential to Achieve Goals: Fair  OT Frequency: Min 2X/week   Barriers to D/C:                           AM-PAC OT "6 Clicks" Daily Activity     Outcome Measure Help from another person eating meals?: A Little Help from another person taking care of personal grooming?: A Little Help from another person toileting, which includes using toliet, bedpan, or urinal?: A Lot Help from another person bathing (including washing, rinsing, drying)?: A Lot Help from another person to put on and taking off regular upper body clothing?: A Lot Help from another person to put on and taking off regular lower body clothing?: A Lot 6 Click Score: 14   End of Session Equipment Utilized During Treatment: Gait belt;Rolling walker Nurse Communication: Other (comment) (cleared patient to participate in session)  Activity Tolerance: Patient limited by fatigue Patient left: in bed;with call bell/phone within reach;with bed alarm set  OT Visit Diagnosis: Unsteadiness on feet (R26.81);Pain Pain - Right/Left:  (abdomen medially.)                Time: 6387-5643 OT Time Calculation (min): 32 min Charges:  OT General Charges $OT Visit: 1 Visit OT Evaluation $OT  Eval Moderate Complexity: 1 Mod OT Treatments $Self Care/Home Management : 8-22 mins  Jackelyn Poling OTR/L, MS Acute Rehabilitation Department Office# [phone] Pager# [phone]   [place] 10/22/2020, 11:57 AM

## 2020-10-22 NOTE — Plan of Care (Signed)
  Problem: Clinical Measurements: Goal: Respiratory complications will improve Outcome: Completed/Met

## 2020-10-22 NOTE — Evaluation (Signed)
Physical Therapy Evaluation Patient Details Name: [name] MRN: [id number] DOB: [birth year] Today's Date: 10/22/2020   History of Present Illness  Patient is a 87 year old female who presented to the hosptial with worsening diarrhea and admitted with AFib w/RVR, and Acute sigmoid diverticulitis. Recent home test-positive covid-19 infection. LNL:GXQJ on eliquis, arthritis, HTN, dementia  Clinical Impression  Pt admitted with above diagnosis. Pt currently with functional limitations due to the deficits listed below (see PT Problem List). Pt will benefit from skilled PT to increase their independence and safety with mobility to allow discharge to the venue listed below.  Pt very HOH and assisted with pericare and bed linen change due to loose green stool.  Pt poor historian and hx of dementia.  Per chart, pt lives with family however they also have Covid-19.  Pt may need ST-SNF upon d/c.      Follow Up Recommendations SNF    Equipment Recommendations  None recommended by PT    Recommendations for Other Services       Precautions / Restrictions Precautions Precautions: Fall Precaution Comments: diarrhea Restrictions Weight Bearing Restrictions: No      Mobility  Bed Mobility Overal bed mobility: Needs Assistance Bed Mobility: Rolling Rolling: Max assist;+2 for physical assistance   Supine to sit: Mod assist     General bed mobility comments: required multimodal cues for technique and pt to attempt to assist; performed pericare as pt laying in loose green stool (RN notified)    Transfers Overall transfer level: Needs assistance Equipment used: Rolling walker (2 wheeled) Transfers: Sit to/from Stand Sit to Stand: Max assist         General transfer comment: deferred due to fatigue and diarrhea  Ambulation/Gait                Stairs            Wheelchair Mobility    Modified Rankin (Stroke Patients Only)       Balance Overall balance  assessment: Needs assistance Sitting-balance support: Bilateral upper extremity supported Sitting balance-Leahy Scale: Fair     Standing balance support: Bilateral upper extremity supported Standing balance-Leahy Scale: Poor Standing balance comment: posterior leaning with BUE on walker with min A to maintain upright standing.                             Pertinent Vitals/Pain Pain Assessment: Faces Faces Pain Scale: No hurt Pain Location: abdomen Pain Descriptors / Indicators: Grimacing;Guarding Pain Intervention(s): Repositioned;Monitored during session    Home Living Family/patient expects to be discharged to:: Private residence Living Arrangements: Children Available Help at Discharge: Family;Available 24 hours/day Type of Home: Other(Comment) (patient was unable to give answers as to where she was living. hosptial notes say patient was living at home with son and daughter in law.)       Home Layout: One level   Additional Comments: pt poor historian, per notes: pt from home with family however they also have Covid-19    Prior Function           Comments: pt not able to answer, likely at least transfers or minimally ambulatory since pt is from home     Hand Dominance   Dominant Hand: Right    Extremity/Trunk Assessment   Upper Extremity Assessment Upper Extremity Assessment: Generalized weakness    Lower Extremity Assessment Lower Extremity Assessment: Generalized weakness    Cervical / Trunk Assessment Cervical /  Trunk Assessment: Normal  Communication   Communication: HOH  Cognition Arousal/Alertness: Awake/alert Behavior During Therapy: Flat affect Overall Cognitive Status: Difficult to assess                                 General Comments: hx of dementia, very HOH      General Comments      Exercises     Assessment/Plan    PT Assessment Patient needs continued PT services  PT Problem List Decreased  strength;Decreased mobility;Decreased activity tolerance;Decreased balance;Decreased knowledge of use of DME       PT Treatment Interventions DME instruction;Gait training;Balance training;Therapeutic exercise;Functional mobility training;Therapeutic activities;Patient/family education;Wheelchair mobility training    PT Goals (Current goals can be found in the Care Plan section)  Acute Rehab PT Goals Patient Stated Goal: "hi" PT Goal Formulation: Patient unable to participate in goal setting Time For Goal Achievement: 11/05/20 Potential to Achieve Goals: Fair    Frequency Min 2X/week   Barriers to discharge        Co-evaluation               AM-PAC PT "6 Clicks" Mobility  Outcome Measure Help needed turning from your back to your side while in a flat bed without using bedrails?: Total Help needed moving from lying on your back to sitting on the side of a flat bed without using bedrails?: Total Help needed moving to and from a bed to a chair (including a wheelchair)?: Total Help needed standing up from a chair using your arms (e.g., wheelchair or bedside chair)?: Total Help needed to walk in hospital room?: Total Help needed climbing 3-5 steps with a railing? : Total 6 Click Score: 6    End of Session   Activity Tolerance: Patient limited by fatigue Patient left: with call bell/phone within reach;in bed;with bed alarm set   PT Visit Diagnosis: Difficulty in walking, not elsewhere classified (R26.2);Muscle weakness (generalized) (M62.81)    Time: 2130-8657 PT Time Calculation (min) (ACUTE ONLY): 33 min   Charges:   PT Evaluation $PT Eval Low Complexity: 1 Low PT Treatments $Therapeutic Activity: 8-22 mins       Jannette Spanner PT, DPT Acute Rehabilitation Services Pager: [phone] Office: [phone]   Alyzae Hawkey,KATHrine E 10/22/2020, 1:10 PM

## 2020-10-22 NOTE — Progress Notes (Signed)
PROGRESS NOTE  [name]  IRC:[id number] DOB: March 11, 1926 DOA: 10/21/2020 PCP: Makayla Neer, MD   Brief Narrative: [name] is a 87 y.o. female with a history of AFib on eliquis, arthritis, HTN, dementia, and recent home test-positive covid-19 infection (10/14/2020) treated with paxlovid, who presented to the ED 7/11 on the advice of her PCP for worsening diarrhea, also with poor per oral intake, fatigue, and increasing weakness. In the ED, she had several episode of diarrhea, afebrile with AFib w/RVR. Negative C. diff, Cr 0.64, K 2.5, WBC 11.7k. CT A/P revealing distal colonic air-fluid levels consistent with diarrhea and diverticulosis with low grade active inflammation. Antibiotics and IV fluids as well as potassium supplementation were given.   IV fluids are continued due to severely poor oral intake. Due to weakness, PT has recommended SNF at discharge.   Assessment & Plan: Principal Problem:   Diverticulitis Active Problems:   Chronic anticoagulation   Acute encephalopathy   Atrial fibrillation, chronic (HCC)   Dehydration   Goals of care, counseling/discussion   Colitis   DNR (do not resuscitate)  Acute sigmoid diverticulitis: Noted on CT without complication. Abd exam very benign at this time.  - Continue zosyn - Continue to encourage intake of CLD.    Diarrhea, dehydration:  - C. diff negative, exam benign, will give imodium to limit GI losses. - Continues to be IV fluids-dependent.    Covid-19 infection: Concern that diarrhea is due to diverticulitis, covid-19 itself as well, but also likely attributable to paxlovid, which likely also contributed to poor per oral intake/dysgeusia. Not lymphopenic. CXR negative, CRP 0.8.  - Stop paxlovid. - Covid test itself here is negative, though will need to remain on isolation for 10 days from the date of positivity (10/14/2020).  - No further Tx indicated.    Chronic atrial fibrillation now with RVR: RVR resolved.  -  Restart diltiazem  - Continue eliquis - Check TSH   GERD, hiatal hernia: - Continue PPI - Consider SLP evaluation.   Arthritis on chronic steroids: Prednisone maintenance dose 1.25mg  daily. - Stress steroids given, will deescalate to home dose and monitor carefully   Hypokalemia: Supplement in IVF  Hypomagnesemia:  - Supplement and monitor in AM.   HTN: - Holding losartan with dehydration. If BP remains elevated, may restart soon.   Acute metabolic encephalopathy on chronic dementia: - Supportive care, anticipate hospital delirium and minimize with precautions. Daughter's presence would be helpful.   RLE edema: Chronic and actually improved from baseline. - Holding lasix. Continuing anticoagulation.   DVT prophylaxis: Eliquis Code Status: DNR (POA) Family Communication: Daughter by phone Disposition Plan:  Status is: Inpatient  Remains inpatient appropriate because:IV treatments appropriate due to intensity of illness or inability to take PO  Dispo: The patient is from: Home              Anticipated d/c is to: SNF              Patient currently is not medically stable to d/c.   Difficult to place patient No  Consultants:  None  Procedures:  None  Antimicrobials: Zosyn   Subjective: Confused, drowsy. Denies pain. Did not eat yesterday or this morning.   Objective: Vitals:   10/21/20 2102 10/22/20 0100 10/22/20 0505 10/22/20 1234  BP: (!) 143/63 135/76 (!) 148/60 (!) 158/71  Pulse: 87 97 85 82  Resp: 20 18 20 16   Temp: 97.8 F (36.6 C) 98.6 F (37 C) 97.9 F (36.6 C)  99.2 F (37.3 C)  TempSrc: Oral Oral Oral Oral  SpO2: 96% 95% 98% 97%  Weight: 73.3 kg     Height: 5' (1.524 m)       Intake/Output Summary (Last 24 hours) at 10/22/2020 1934 Last data filed at 10/22/2020 1500 Gross per 24 hour  Intake 1417.59 ml  Output --  Net 1417.59 ml   Filed Weights   10/21/20 2102  Weight: 73.3 kg    Gen: Frail elderly female in no distress Pulm:  Non-labored breathing. Clear to auscultation bilaterally.  CV: Irreg irreg. No murmur, rub, or gallop. No JVD, RLE stable pedal edema. GI: Abdomen soft, non-tender, non-distended, with normoactive bowel sounds. No organomegaly or masses felt. Ext: Warm, no deformities Skin: No rashes, lesions or ulcers on visualized skin Neuro: Rousable, not oriented. Moves all extremities.  Psych: Judgement and insight appear impaired.  Data Reviewed: I have personally reviewed following labs and imaging studies  CBC: Recent Labs  Lab 10/21/20 1343 10/22/20 0352  WBC 11.7* 10.3  HGB 14.1 11.9*  HCT 40.5 35.0*  MCV 89.8 92.6  PLT 352 326   Basic Metabolic Panel: Recent Labs  Lab 10/21/20 1343 10/22/20 0756  NA 137 141  K 2.5* 3.0*  CL 100 108  CO2 23 26  GLUCOSE 149* 165*  BUN 10 6*  CREATININE 0.64 0.51  CALCIUM 9.9 8.5*  MG 1.9 1.5*   GFR: Estimated Creatinine Clearance: 39.3 mL/min (by C-G formula based on SCr of 0.51 mg/dL). Liver Function Tests: Recent Labs  Lab 10/21/20 1343  AST 23  ALT 21  ALKPHOS 75  BILITOT 0.7  PROT 7.6  ALBUMIN 3.7   Recent Labs  Lab 10/21/20 1343  LIPASE 29   No results for input(s): AMMONIA in the last 168 hours. Coagulation Profile: No results for input(s): INR, PROTIME in the last 168 hours. Cardiac Enzymes: No results for input(s): CKTOTAL, CKMB, CKMBINDEX, TROPONINI in the last 168 hours. BNP (last 3 results) No results for input(s): PROBNP in the last 8760 hours. HbA1C: No results for input(s): HGBA1C in the last 72 hours. CBG: No results for input(s): GLUCAP in the last 168 hours. Lipid Profile: No results for input(s): CHOL, HDL, LDLCALC, TRIG, CHOLHDL, LDLDIRECT in the last 72 hours. Thyroid Function Tests: No results for input(s): TSH, T4TOTAL, FREET4, T3FREE, THYROIDAB in the last 72 hours. Anemia Panel: No results for input(s): VITAMINB12, FOLATE, FERRITIN, TIBC, IRON, RETICCTPCT in the last 72 hours. Urine analysis:     Component Value Date/Time   COLORURINE YELLOW 11/02/2018 2221   APPEARANCEUR CLOUDY (A) 11/02/2018 2221   LABSPEC 1.016 11/02/2018 2221   PHURINE 6.0 11/02/2018 [place] 11/02/2018 2221   HGBUR NEGATIVE 11/02/2018 2221   [place] NEGATIVE 11/02/2018 [place] 11/02/2018 2221   PROTEINUR NEGATIVE 11/02/2018 2221   UROBILINOGEN 0.2 08/02/2007 1310   NITRITE NEGATIVE 11/02/2018 2221   LEUKOCYTESUR LARGE (A) 11/02/2018 2221   Recent Results (from the past 240 hour(s))  Gastrointestinal Panel by PCR , Stool     Status: None   Collection Time: 10/21/20  4:09 PM   Specimen: Stool  Result Value Ref Range Status   Campylobacter species NOT DETECTED NOT DETECTED Final   Plesimonas shigelloides NOT DETECTED NOT DETECTED Final   Salmonella species NOT DETECTED NOT DETECTED Final   Yersinia enterocolitica NOT DETECTED NOT DETECTED Final   Vibrio species NOT DETECTED NOT DETECTED Final   Vibrio cholerae NOT DETECTED NOT DETECTED Final  Enteroaggregative E coli (EAEC) NOT DETECTED NOT DETECTED Final   Enteropathogenic E coli (EPEC) NOT DETECTED NOT DETECTED Final   Enterotoxigenic E coli (ETEC) NOT DETECTED NOT DETECTED Final   Shiga like toxin producing E coli (STEC) NOT DETECTED NOT DETECTED Final   Shigella/Enteroinvasive E coli (EIEC) NOT DETECTED NOT DETECTED Final   Cryptosporidium NOT DETECTED NOT DETECTED Final   Cyclospora cayetanensis NOT DETECTED NOT DETECTED Final   Entamoeba histolytica NOT DETECTED NOT DETECTED Final   Giardia lamblia NOT DETECTED NOT DETECTED Final   Adenovirus F40/41 NOT DETECTED NOT DETECTED Final   Astrovirus NOT DETECTED NOT DETECTED Final   Norovirus GI/GII NOT DETECTED NOT DETECTED Final   Rotavirus A NOT DETECTED NOT DETECTED Final   Sapovirus (I, II, IV, and V) NOT DETECTED NOT DETECTED Final    Comment: Performed at [facility], [address], [place], [place] [address]  C Difficile Quick Screen w PCR  reflex     Status: None   Collection Time: 10/21/20  4:09 PM   Specimen: Stool  Result Value Ref Range Status   C Diff antigen NEGATIVE NEGATIVE Final   C Diff toxin NEGATIVE NEGATIVE Final   C Diff interpretation No C. difficile detected.  Final    Comment: Performed at [facility], [place] [address] [place], [place] [address]  Resp Panel by RT-PCR (Flu A&B, Covid) Nasopharyngeal Swab     Status: None   Collection Time: 10/21/20  5:22 PM   Specimen: Nasopharyngeal Swab; Nasopharyngeal(NP) swabs in vial transport medium  Result Value Ref Range Status   SARS Coronavirus 2 by RT PCR NEGATIVE NEGATIVE Final    Comment: (NOTE) SARS-CoV-2 target nucleic acids are NOT DETECTED.  The SARS-CoV-2 RNA is generally detectable in upper respiratory specimens during the acute phase of infection. The lowest concentration of SARS-CoV-2 viral copies this assay can detect is 138 copies/mL. A negative result does not preclude SARS-Cov-2 infection and should not be used as the sole basis for treatment or other patient management decisions. A negative result may occur with  improper specimen collection/handling, submission of specimen other than nasopharyngeal swab, presence of viral mutation(s) within the areas targeted by this assay, and inadequate number of viral copies(<138 copies/mL). A negative result must be combined with clinical observations, patient history, and epidemiological information. The expected result is Negative.  Fact Sheet for Patients:  EntrepreneurPulse.com.au  Fact Sheet for Healthcare Providers:  IncredibleEmployment.be  This test is no t yet approved or cleared by the [place] FDA and  has been authorized for detection and/or diagnosis of SARS-CoV-2 by FDA under an Emergency Use Authorization (EUA). This EUA will remain  in effect (meaning this test can be used) for the duration of the COVID-19 declaration under  Section 564(b)(1) of the Act, 21 U.S.C.section 360bbb-3(b)(1), unless the authorization is terminated  or revoked sooner.       Influenza A by PCR NEGATIVE NEGATIVE Final   Influenza B by PCR NEGATIVE NEGATIVE Final    Comment: (NOTE) The Xpert Xpress SARS-CoV-2/FLU/RSV plus assay is intended as an aid in the diagnosis of influenza from Nasopharyngeal swab specimens and should not be used as a sole basis for treatment. Nasal washings and aspirates are unacceptable for Xpert Xpress SARS-CoV-2/FLU/RSV testing.  Fact Sheet for Patients: EntrepreneurPulse.com.au  Fact Sheet for Healthcare Providers: IncredibleEmployment.be  This test is not yet approved or cleared by the [place] FDA and has been authorized for detection and/or diagnosis of SARS-CoV-2 by  FDA under an Emergency Use Authorization (EUA). This EUA will remain in effect (meaning this test can be used) for the duration of the COVID-19 declaration under Section 564(b)(1) of the Act, 21 U.S.C. section 360bbb-3(b)(1), unless the authorization is terminated or revoked.  Performed at [facility], [place] [address] [place], [place] [address]       Radiology Studies: CT ABDOMEN PELVIS W CONTRAST  Result Date: 10/21/2020 CLINICAL DATA:  Diarrhea. COVID infection. Possible diverticulitis. EXAM: CT ABDOMEN AND PELVIS WITH CONTRAST TECHNIQUE: Multidetector CT imaging of the abdomen and pelvis was performed using the standard protocol following bolus administration of intravenous contrast. CONTRAST:  54mL OMNIPAQUE IOHEXOL 350 MG/ML SOLN COMPARISON:  11/03/2018 FINDINGS: Despite efforts by the technologist and patient, motion artifact is present on today's exam and could not be eliminated. This reduces exam sensitivity and specificity. Lower chest: Large hiatal hernia, only partially included on today CT of the abdomen. Right coronary artery atherosclerotic calcification along  with mitral valve calcification. Descending thoracic aortic atherosclerosis. Hepatobiliary: Dependent density in the gallbladder favors a small gallstone. Otherwise unremarkable. Pancreas: Unremarkable Spleen: Scarring in the upper spleen, unchanged. Adrenals/Urinary Tract: Exophytic hypodense 1.8 by 1.2 cm fluid density lesion favoring cyst from the right kidney lower pole, image 34 series 2. Urinary bladder unremarkable. The adrenal glands appear normal. 6 mm hypodense lesion of the left kidney upper pole is likely a small cyst but technically too small to characterize. Stomach/Bowel: Large hiatal hernia. Periampullary and transverse duodenal diverticula. Fluid levels in the distal colon and rectum indicating diarrheal process. Sigmoid colon diverticulosis with equivocal diverticulitis distally in the sigmoid colon. No extraluminal gas or abscess. Most of the colon is collapsed/nondistended. Appendix not visualized. Vascular/Lymphatic: Aortoiliac atherosclerotic vascular disease. Reproductive: Uterus absent.  Adnexa unremarkable. Other: No supplemental non-categorized findings. Musculoskeletal: Lumbar spondylosis and degenerative disc disease mild levoconvex lumbar scoliosis. Interbody fusion at T12-L1. IMPRESSION: 1. Sigmoid diverticulosis likely with low-grade active diverticulitis in the distal sigmoid colon. Air fluid levels in the distal colon and rectum indicate diarrheal process. 2. Chronic large hiatal hernia. 3. Other imaging findings of potential clinical significance: Cholelithiasis. Aortic Atherosclerosis (ICD10-I70.0). Lumbar spondylosis and degenerative disc disease. Electronically Signed   By: Van Clines M.D.   On: 10/21/2020 17:17   DG CHEST PORT 1 VIEW  Result Date: 10/21/2020 CLINICAL DATA:  COVID positive, diarrhea and confusion EXAM: PORTABLE CHEST 1 VIEW COMPARISON:  November 03, 2018. FINDINGS: The heart size and mediastinal contours are within normal limits. Both lungs are clear. The  visualized skeletal structures are unremarkable. Surgical clips overlie the right axilla. IMPRESSION: No acute cardiopulmonary disease. Electronically Signed   By: Dahlia Bailiff MD   On: 10/21/2020 20:23    Scheduled Meds:  apixaban  5 mg Oral BID   atorvastatin  20 mg Oral Daily   hydrocortisone sod succinate (SOLU-CORTEF) inj  50 mg Intravenous Q8H   pantoprazole  40 mg Oral Daily   sertraline  50 mg Oral q morning   sodium chloride flush  3 mL Intravenous Q12H   Continuous Infusions:  piperacillin-tazobactam (ZOSYN)  IV 3.375 g (10/22/20 1402)   sodium chloride 0.45 % with kcl 75 mL/hr at 10/22/20 1400     LOS: 0 days   Time spent: 25 minutes.  Makayla Pour, MD [facility] [email or web address] 10/22/2020, 7:34 PM

## 2020-10-23 LAB — BASIC METABOLIC PANEL
Anion gap: 8 (ref 5–15)
BUN: 5 mg/dL — ABNORMAL LOW (ref 8–23)
CO2: 24 mmol/L (ref 22–32)
Calcium: 8.8 mg/dL — ABNORMAL LOW (ref 8.9–10.3)
Chloride: 109 mmol/L (ref 98–111)
Creatinine, Ser: 0.61 mg/dL (ref 0.44–1.00)
GFR, Estimated: >60 mL/min (ref >60)
Glucose, Bld: 119 mg/dL — ABNORMAL HIGH (ref 70–99)
Potassium: 3.4 mmol/L — ABNORMAL LOW (ref 3.5–5.1)
Sodium: 141 mmol/L (ref 135–145)

## 2020-10-23 LAB — TSH: TSH: 2.976 u[IU]/mL (ref 0.350–4.500)

## 2020-10-23 LAB — MAGNESIUM: Magnesium: 2.1 mg/dL (ref 1.7–2.4)

## 2020-10-23 MED ORDER — ADULT MULTIVITAMIN W/MINERALS CH
1.0000 | ORAL_TABLET | Freq: Every day | ORAL | Status: DC
  Administered 2020-10-23 – 2020-10-28 (×6): 1 via ORAL
  Filled 2020-10-23 (×6): qty 1

## 2020-10-23 MED ORDER — PROSOURCE PLUS PO LIQD
30.0000 mL | Freq: Two times a day (BID) | ORAL | Status: DC
  Administered 2020-10-23 – 2020-10-28 (×8): 30 mL via ORAL
  Filled 2020-10-23 (×8): qty 30

## 2020-10-23 MED ORDER — BOOST / RESOURCE BREEZE PO LIQD CUSTOM
1.0000 | Freq: Three times a day (TID) | ORAL | Status: DC
  Administered 2020-10-23 – 2020-10-28 (×14): 1 via ORAL

## 2020-10-23 MED ORDER — HYDROCODONE-ACETAMINOPHEN 5-325 MG PO TABS: 1.0000 | ORAL_TABLET | Freq: Three times a day (TID) | ORAL | Status: DC | PRN

## 2020-10-23 NOTE — Progress Notes (Signed)
Initial Nutrition Assessment  DOCUMENTATION CODES:   Obesity unspecified  INTERVENTION:  - will order Boost Breeze TID, each supplement provides 250 kcal and 9 grams of protein. - will order 30 ml Prosource Plus BID, each supplement provides 100 kcal and 15 grams protein.  - will order 1 tablet multivitamin with minerals/day.   NUTRITION DIAGNOSIS:   Increased nutrient needs related to acute illness, catabolic illness (FGHWE-99 infection) as evidenced by estimated needs.  GOAL:   Patient will meet greater than or equal to 90% of their needs  MONITOR:   PO intake, Supplement acceptance, Diet advancement, Labs, Weight trends  REASON FOR ASSESSMENT:   Malnutrition Screening Tool  ASSESSMENT:   87 y.o. female with medical history of AFib on eliquis, arthritis, HTN, and dementia. She recently tested positive on a COVID-19 home test (7/4). She presented to the ED on 7/11 at the advising of her PCP d/t worsening diarrhea, poor PO intake, fatigue, and increasing weakness. She was cdiff negative in the ED. CT abdomen/pelvis showed distal diverticulosis and low-grade diverticulitis. PT recommending SNF at d/c.  No intakes documented since admission. She has been on CLD since admission. She is noted to be a/o to self only with hx of dementia.  Weight on 7/11 was 162 lb and weight weight on 09/11/19 was 169 lb. This indicates 7 lb weight loss (4% body weight) in the past 10 months; not significant for time frame.   Per notes: - acute sigmoid diverticulitis  - diarrhea, dehydration  - COVID-19 infection--COVID test in the ED was negative - GERD, hiatal hernia - hx of arthritis on chronic steroids - acute metabolic encephalopathy on hx of dementia - RLE edema   Labs reviewed; K: 3.4 mmol/l, BUN: 5 mg/dl. Medications reviewed; PRN imodium, 40 mg oral protonix/day, 1.25 mg deltasone/day. IVF; NS-40 mEq KCl @ 75 ml/hr.     NUTRITION - FOCUSED PHYSICAL EXAM:  Unable to complete at  this time.   Diet Order:   Diet Order             Diet clear liquid Room service appropriate? Yes; Fluid consistency: Thin  Diet effective now                   EDUCATION NEEDS:   Not appropriate for education at this time  Skin:  Skin Assessment: Skin Integrity Issues: Skin Integrity Issues:: Other (Comment) Other: IAD R buttocks  Last BM:  7/13 (type 7)  Height:   Ht Readings from Last 1 Encounters:  10/21/20 5' (1.524 m)    Weight:   Wt Readings from Last 1 Encounters:  10/21/20 73.3 kg      Estimated Nutritional Needs:  Kcal:  1350-1550 kcal Protein:  65-80 grams Fluid:  >/= 1.7 L/day       Jarome Matin, MS, RD, LDN, CNSC Inpatient Clinical Dietitian RD pager # available in [facility]  After hours/weekend pager # available in [facility]

## 2020-10-23 NOTE — Plan of Care (Signed)

## 2020-10-23 NOTE — TOC Initial Note (Signed)
Transition of Care [facility]) - Initial/Assessment Note    Patient Details  Name: [name] MRN: [id number] Date of Birth: 1925/06/23  Transition of Care [facility]) CM/SW Contact:    Ross Ludwig, LCSW Phone Number: 10/23/2020, 5:48 PM  Clinical Narrative:                  Patient is a 87 year old female who is alert and oriented x1.  Patient lives with her daughter, CSW spoke to daughter to complete assessment due to patient having some confusion.  Patient's daughter helps take care of patient, and they have aides coming in to help at the home.  Patient's daughter is interested in having patient go to SNF for short term rehab to gain some strength back before returning back home.  Patient's daughter is interested in patient going to [facility], which is where patient's daughter's husband will be going for short term rehab.  CSW was given permission to begin bed search in [facility].  Expected Discharge Plan: Skilled Nursing Facility Barriers to Discharge: Continued Medical Work up   Patient Goals and CMS Choice Patient states their goals for this hospitalization and ongoing recovery are:: Patient plans to go to SNF for rehab, then return back home with home health. CMS Medicare.gov Compare Post Acute Care list provided to:: Patient Represenative (must comment) Choice offered to / list presented to : Adult Children  Expected Discharge Plan and Services Expected Discharge Plan: [place] In-house Referral: Clinical Social Work   Post Acute Care Choice: [place] Living arrangements for the past 2 months: [place]                                      Prior Living Arrangements/Services Living arrangements for the past 2 months: Single Family Home Lives with:: Adult Children Patient language and need for interpreter reviewed:: Yes Do you feel safe going back to the place where you live?: No   Patient's daughter feels she needs  short term rehab before returning back home.  Need for Family Participation in Patient Care: Yes (Comment) Care giver support system in place?: Yes (comment) Current home services: Homehealth aide Criminal Activity/Legal Involvement Pertinent to Current Situation/Hospitalization: No - Comment as needed  Activities of Daily Living Home Assistive Devices/Equipment: None ADL Screening (condition at time of admission) Patient's cognitive ability adequate to safely complete daily activities?: Yes Is the patient deaf or have difficulty hearing?: Yes Does the patient have difficulty seeing, even when wearing glasses/contacts?: No Does the patient have difficulty concentrating, remembering, or making decisions?: Yes Patient able to express need for assistance with ADLs?: Yes Does the patient have difficulty dressing or bathing?: Yes Independently performs ADLs?: No Communication: Independent Dressing (OT): Needs assistance Is this a change from baseline?: Pre-admission baseline Grooming: Needs assistance Is this a change from baseline?: Pre-admission baseline Feeding: Independent Is this a change from baseline?: Pre-admission baseline Bathing: Needs assistance Is this a change from baseline?: Pre-admission baseline Toileting: Needs assistance Is this a change from baseline?: Pre-admission baseline In/Out Bed: Needs assistance Is this a change from baseline?: Pre-admission baseline Walks in Home: Needs assistance Is this a change from baseline?: Pre-admission baseline Does the patient have difficulty walking or climbing stairs?: Yes Weakness of Legs: Both Weakness of Arms/Hands: None  Permission Sought/Granted Permission sought to share information with : Case Manager, Customer service manager, Family  Supports Permission granted to share information with : Yes, Release of Information Signed  Share Information with NAME: Hill,[name] Daughter [phone]  [phone]  [name],[name]  Daughter   [phone]  Permission granted to share info w AGENCY: SNF admissions        Emotional Assessment Appearance:: Appears stated age   Affect (typically observed): Calm, Accepting, Appropriate Orientation: : Oriented to Self Alcohol / Substance Use: Not Applicable Psych Involvement: No (comment)  Admission diagnosis:  Cough [R05.9] Colitis [K52.9] COVID-19 [U07.1] Dehydration [E86.0] Patient Active Problem List   Diagnosis Date Noted   Colitis 10/21/2020   Diverticulitis 10/21/2020   DNR (do not resuscitate) 10/21/2020   Abdominal discomfort, epigastric    Goals of care, counseling/discussion    Palliative care by specialist    Abdominal pain 11/02/2018   Nausea and vomiting 11/02/2018   Dehydration 11/02/2018   Dyslipidemia 11/02/2018   Chronic back pain 11/02/2018   Acute cholecystitis 11/02/2018   Cholelithiasis 11/01/2018   Atrial fibrillation, chronic ([place]) 10/18/2018   Gallstones 10/18/2018   Acute encephalopathy 10/17/2018   Atrial fibrillation, new onset ([place]) 09/13/2016   Chronic anticoagulation 09/13/2016   Essential hypertension 09/13/2016   Chest pain with moderate risk of acute coronary syndrome 09/12/2016   Breast cancer, right breast, recurrent 06/03/2011   Personal history of malignant neoplasm of breast 04/14/1987   PCP:  Mayra Neer, MD Pharmacy:   [id number] - [place], [place] - [address] [place] [address] Phone: [phone] Fax: [phone]     Social Determinants of Health (SDOH) Interventions    Readmission Risk Interventions No flowsheet data found.

## 2020-10-23 NOTE — Progress Notes (Addendum)
[name]  QIH:[id number] DOB: 1925-07-07 DOA: 10/21/2020 PCP: Mayra Neer, MD    Brief Narrative:  87 year old with a history of atrial fibrillation on Eliquis, osteoarthritis, HTN, dementia, and positive home COVID test 10/14/2020 status post Paxlovid treatment who presented to the ED 7/11 with worsening diarrhea very poor oral intake fatigue and severe generalized weakness.  In the ED she was found to be in A. fib with RVR.  She was C. difficile negative.  She was hypokalemic at 2.5.  CT abdomen revealed distal colonic air-fluid levels consistent with diarrhea and diverticulosis with low-grade active inflammation.  Consultants:  None  Code Status: NO CODE BLUE  Antimicrobials:  Zosyn  DVT prophylaxis: Eliquis  Subjective: Afebrile.  Vital signs stable.  Alert and pleasant but confused and unable to provide a reliable history.  Appears comfortable.  Assessment & Plan:  Acute sigmoid diverticulitis No complicating factors noted on CT -continue empiric antibiotic -continue clear liquid diet  Severe diarrhea C. difficile negative -exam benign -support with IV fluid until improves  COVID-19 infection Negative COVID test in hospital -to remain on isolation for 10 days starting 10/14/2020 (7/14 will be last day of isolation)  Chronic atrial fibrillation with acute RVR RVR now resolved -continue diltiazem and Eliquis  GERD with hiatal hernia Continue PPI  Arthritis on chronic steroids Continue usual steroid dosing  Hypokalemia Due to GI losses -continue to supplement and follow  Hypomagnesemia Due to GI losses -corrected with supplementation  HTN Blood pressure reasonably controlled  Acute metabolic encephalopathy on chronic dementia  Right lower extremity edema Acute on chronic    Family Communication:  Status is: Inpatient  Remains inpatient appropriate because:Inpatient level of care appropriate due to severity of illness  Dispo: The patient is from:  Home              Anticipated d/c is to: SNF              Patient currently is not medically stable to d/c.   Difficult to place patient No   Objective: Blood pressure 135/66, pulse 75, temperature 97.8 F (36.6 C), resp. rate 18, height 5' (1.524 m), weight 73.3 kg, SpO2 97 %.  Intake/Output Summary (Last 24 hours) at 10/23/2020 1030 Last data filed at 10/23/2020 0925 Gross per 24 hour  Intake 2134.38 ml  Output --  Net 2134.38 ml   Filed Weights   10/21/20 2102  Weight: 73.3 kg    Examination: General: No acute respiratory distress Lungs: Clear to auscultation bilaterally without wheezes or crackles Cardiovascular: Regular rate and rhythm without murmur gallop or rub normal S1 and S2 Abdomen: Nontender, nondistended, soft, bowel sounds positive, no rebound, no ascites, no appreciable mass Extremities: No significant cyanosis, clubbing, or edema bilateral lower extremities  CBC: Recent Labs  Lab 10/21/20 1343 10/22/20 0352  WBC 11.7* 10.3  HGB 14.1 11.9*  HCT 40.5 35.0*  MCV 89.8 92.6  PLT 352 875   Basic Metabolic Panel: Recent Labs  Lab 10/21/20 1343 10/22/20 0756 10/23/20 0412  NA 137 141 141  K 2.5* 3.0* 3.4*  CL 100 108 109  CO2 23 26 24   GLUCOSE 149* 165* 119*  BUN 10 6* 5*  CREATININE 0.64 0.51 0.61  CALCIUM 9.9 8.5* 8.8*  MG 1.9 1.5* 2.1   GFR: Estimated Creatinine Clearance: 39.3 mL/min (by C-G formula based on SCr of 0.61 mg/dL).  Liver Function Tests: Recent Labs  Lab 10/21/20 1343  AST 23  ALT 21  ALKPHOS  75  BILITOT 0.7  PROT 7.6  ALBUMIN 3.7   Recent Labs  Lab 10/21/20 1343  LIPASE 29     Recent Results (from the past 240 hour(s))  Gastrointestinal Panel by PCR , Stool     Status: None   Collection Time: 10/21/20  4:09 PM   Specimen: Stool  Result Value Ref Range Status   Campylobacter species NOT DETECTED NOT DETECTED Final   Plesimonas shigelloides NOT DETECTED NOT DETECTED Final   Salmonella species NOT DETECTED NOT  DETECTED Final   Yersinia enterocolitica NOT DETECTED NOT DETECTED Final   Vibrio species NOT DETECTED NOT DETECTED Final   Vibrio cholerae NOT DETECTED NOT DETECTED Final   Enteroaggregative E coli (EAEC) NOT DETECTED NOT DETECTED Final   Enteropathogenic E coli (EPEC) NOT DETECTED NOT DETECTED Final   Enterotoxigenic E coli (ETEC) NOT DETECTED NOT DETECTED Final   Shiga like toxin producing E coli (STEC) NOT DETECTED NOT DETECTED Final   Shigella/Enteroinvasive E coli (EIEC) NOT DETECTED NOT DETECTED Final   Cryptosporidium NOT DETECTED NOT DETECTED Final   Cyclospora cayetanensis NOT DETECTED NOT DETECTED Final   Entamoeba histolytica NOT DETECTED NOT DETECTED Final   Giardia lamblia NOT DETECTED NOT DETECTED Final   Adenovirus F40/41 NOT DETECTED NOT DETECTED Final   Astrovirus NOT DETECTED NOT DETECTED Final   Norovirus GI/GII NOT DETECTED NOT DETECTED Final   Rotavirus A NOT DETECTED NOT DETECTED Final   Sapovirus (I, II, IV, and V) NOT DETECTED NOT DETECTED Final    Comment: Performed at [facility], [address] [place], [place] [address]  C Difficile Quick Screen w PCR reflex     Status: None   Collection Time: 10/21/20  4:09 PM   Specimen: Stool  Result Value Ref Range Status   C Diff antigen NEGATIVE NEGATIVE Final   C Diff toxin NEGATIVE NEGATIVE Final   C Diff interpretation No C. difficile detected.  Final    Comment: Performed at [facility], [place] [address], [place], [place] [address]  Resp Panel by RT-PCR (Flu A&B, Covid) Nasopharyngeal Swab     Status: None   Collection Time: 10/21/20  5:22 PM   Specimen: Nasopharyngeal Swab; Nasopharyngeal(NP) swabs in vial transport medium  Result Value Ref Range Status   SARS Coronavirus 2 by RT PCR NEGATIVE NEGATIVE Final    Comment: (NOTE) SARS-CoV-2 target nucleic acids are NOT DETECTED.  The SARS-CoV-2 RNA is generally detectable in upper respiratory specimens during the acute phase of  infection. The lowest concentration of SARS-CoV-2 viral copies this assay can detect is 138 copies/mL. A negative result does not preclude SARS-Cov-2 infection and should not be used as the sole basis for treatment or other patient management decisions. A negative result may occur with  improper specimen collection/handling, submission of specimen other than nasopharyngeal swab, presence of viral mutation(s) within the areas targeted by this assay, and inadequate number of viral copies(<138 copies/mL). A negative result must be combined with clinical observations, patient history, and epidemiological information. The expected result is Negative.  Fact Sheet for Patients:  EntrepreneurPulse.com.au  Fact Sheet for Healthcare Providers:  IncredibleEmployment.be  This test is no t yet approved or cleared by the [place] FDA and  has been authorized for detection and/or diagnosis of SARS-CoV-2 by FDA under an Emergency Use Authorization (EUA). This EUA will remain  in effect (meaning this test can be used) for the duration of the COVID-19 declaration under Section 564(b)(1) of the Act,  21 U.S.C.section 360bbb-3(b)(1), unless the authorization is terminated  or revoked sooner.       Influenza A by PCR NEGATIVE NEGATIVE Final   Influenza B by PCR NEGATIVE NEGATIVE Final    Comment: (NOTE) The Xpert Xpress SARS-CoV-2/FLU/RSV plus assay is intended as an aid in the diagnosis of influenza from Nasopharyngeal swab specimens and should not be used as a sole basis for treatment. Nasal washings and aspirates are unacceptable for Xpert Xpress SARS-CoV-2/FLU/RSV testing.  Fact Sheet for Patients: EntrepreneurPulse.com.au  Fact Sheet for Healthcare Providers: IncredibleEmployment.be  This test is not yet approved or cleared by the [place] FDA and has been authorized for detection and/or diagnosis of SARS-CoV-2  by FDA under an Emergency Use Authorization (EUA). This EUA will remain in effect (meaning this test can be used) for the duration of the COVID-19 declaration under Section 564(b)(1) of the Act, 21 U.S.C. section 360bbb-3(b)(1), unless the authorization is terminated or revoked.  Performed at [facility], [place] [address] [place], [place] [address]      Scheduled Meds:  apixaban  5 mg Oral BID   atorvastatin  20 mg Oral Daily   diltiazem  240 mg Oral Daily   pantoprazole  40 mg Oral Daily   predniSONE  1.25 mg Oral Q breakfast   sertraline  50 mg Oral q morning   sodium chloride flush  3 mL Intravenous Q12H   Continuous Infusions:  piperacillin-tazobactam (ZOSYN)  IV 3.375 g (10/23/20 0505)   sodium chloride 0.45 % with kcl 75 mL/hr at 10/23/20 0546     LOS: 1 day   Cherene Altes, MD [facility] Office  [phone] Pager - Text Page per Shea Evans  If 7PM-7AM, please contact night-coverage per Amion 10/23/2020, 10:30 AM

## 2020-10-23 NOTE — [place] FL2 (Signed)
[place] MEDICAID FL2 LEVEL OF CARE SCREENING TOOL     IDENTIFICATION  Patient Name: [name] Birthdate: [birth year] Sex: female Admission Date (Current Location): 10/21/2020  [facility] and [place] Number:  Herbalist and Address:  [facility],  [place], [place]      Provider Number: [id number]  Attending Physician Name and Address:  Cherene Altes, MD  Relative Name and Phone Number:  [facility] Daughter [phone]  [phone]  [name],[name] Daughter   [phone]    Current Level of Care: Hospital Recommended Level of Care: [place] Prior Approval Number:    Date Approved/Denied:   PASRR Number: [id number]  Discharge Plan: SNF    Current Diagnoses: Patient Active Problem List   Diagnosis Date Noted   Colitis 10/21/2020   Diverticulitis 10/21/2020   DNR (do not resuscitate) 10/21/2020   Abdominal discomfort, epigastric    Goals of care, counseling/discussion    Palliative care by specialist    Abdominal pain 11/02/2018   Nausea and vomiting 11/02/2018   Dehydration 11/02/2018   Dyslipidemia 11/02/2018   Chronic back pain 11/02/2018   Acute cholecystitis 11/02/2018   Cholelithiasis 11/01/2018   Atrial fibrillation, chronic ([place]) 10/18/2018   Gallstones 10/18/2018   Acute encephalopathy 10/17/2018   Atrial fibrillation, new onset ([place]) 09/13/2016   Chronic anticoagulation 09/13/2016   Essential hypertension 09/13/2016   Chest pain with moderate risk of acute coronary syndrome 09/12/2016   Breast cancer, right breast, recurrent 06/03/2011   Personal history of malignant neoplasm of breast 04/14/1987    Orientation RESPIRATION BLADDER Height & Weight     Self  Normal Incontinent Weight: 161 lb 11.2 oz (73.3 kg) Height:  5' (152.4 cm)  BEHAVIORAL SYMPTOMS/MOOD NEUROLOGICAL BOWEL NUTRITION STATUS      Continent    AMBULATORY STATUS COMMUNICATION OF NEEDS Skin   Limited  Assist Verbally Surgical wounds                       Personal Care Assistance Level of Assistance  Bathing, Feeding, Dressing Bathing Assistance: Limited assistance Feeding assistance: Independent Dressing Assistance: Limited assistance     Functional Limitations Info  Sight, Speech, Hearing Sight Info: Adequate Hearing Info: Adequate Speech Info: Adequate    SPECIAL CARE FACTORS FREQUENCY  PT (By licensed PT), OT (By licensed OT)     PT Frequency: Minimum 5x a week OT Frequency: Minimum 5x a week.            Contractures Contractures Info: Not present    Additional Factors Info  Allergies, Psychotropic, Code Status Code Status Info: DNR Allergies Info: Arimidex (Anastrozole)   Fish Allergy   Morphine And Related   Shellfish Allergy   Vicodin (Hydrocodone-acetaminophen) Psychotropic Info: sertraline (ZOLOFT) tablet 50 mg         Current Medications (10/23/2020):  This is the current hospital active medication list Current Facility-Administered Medications  Medication Dose Route Frequency Provider Last Rate Last Admin   (feeding supplement) PROSource Plus liquid 30 mL  30 mL Oral BID BM Cherene Altes, MD       acetaminophen (TYLENOL) tablet 650 mg  650 mg Oral Q6H PRN Patrecia Pour, MD   650 mg at 10/22/20 2119   Or   acetaminophen (TYLENOL) suppository 650 mg  650 mg Rectal Q6H PRN Patrecia Pour, MD       apixaban Arne Cleveland) tablet 5 mg  5 mg Oral BID Bonner Puna,  Meredith Leeds, MD   5 mg at 10/23/20 2248   atorvastatin (LIPITOR) tablet 20 mg  20 mg Oral Daily Patrecia Pour, MD   20 mg at 10/23/20 2500   diltiazem (CARDIZEM CD) 24 hr capsule 240 mg  240 mg Oral Daily Patrecia Pour, MD   240 mg at 10/23/20 3704   feeding supplement (BOOST / RESOURCE BREEZE) liquid 1 Container  1 Container Oral TID BM Cherene Altes, MD   1 Container at 10/23/20 1503   HYDROcodone-acetaminophen (NORCO/VICODIN) 5-325 MG per tablet 1 tablet  1 tablet Oral Q8H PRN Patrecia Pour, MD        loperamide (IMODIUM) capsule 2 mg  2 mg Oral PRN Patrecia Pour, MD   2 mg at 10/22/20 2121   multivitamin with minerals tablet 1 tablet  1 tablet Oral Daily Cherene Altes, MD   1 tablet at 10/23/20 1503   ondansetron (ZOFRAN) tablet 4 mg  4 mg Oral Q6H PRN Patrecia Pour, MD       Or   ondansetron [facility]) injection 4 mg  4 mg Intravenous Q6H PRN Patrecia Pour, MD       pantoprazole (PROTONIX) EC tablet 40 mg  40 mg Oral Daily Vance Gather B, MD   40 mg at 10/23/20 0938   piperacillin-tazobactam (ZOSYN) IVPB 3.375 g  3.375 g Intravenous Katrine Coho, RPH 12.5 mL/hr at 10/23/20 1503 3.375 g at 10/23/20 1503   predniSONE (DELTASONE) tablet 1.25 mg  1.25 mg Oral Q breakfast Vance Gather B, MD   1.25 mg at 10/23/20 8889   sertraline (ZOLOFT) tablet 50 mg  50 mg Oral q morning Vance Gather B, MD   50 mg at 10/23/20 1694   sodium chloride 0.45 % 1,000 mL with potassium chloride 40 mEq infusion   Intravenous Continuous Patrecia Pour, MD 75 mL/hr at 10/23/20 0546 New Bag at 10/23/20 0546   sodium chloride flush (NS) 0.9 % injection 3 mL  3 mL Intravenous Q12H Patrecia Pour, MD   3 mL at 10/22/20 2121     Discharge Medications: Please see discharge summary for a list of discharge medications.  Relevant Imaging Results:  Relevant Lab Results:   Additional Information SSN [id number]  Ross Ludwig, LCSW

## 2020-10-24 LAB — CBC
HCT: 34.8 % — ABNORMAL LOW (ref 36.0–46.0)
Hemoglobin: 11.4 g/dL — ABNORMAL LOW (ref 12.0–15.0)
MCH: 31.1 pg (ref 26.0–34.0)
MCHC: 32.8 g/dL (ref 30.0–36.0)
MCV: 94.8 fL (ref 80.0–100.0)
Platelets: 275 10*3/uL (ref 150–400)
RBC: 3.67 MIL/uL — ABNORMAL LOW (ref 3.87–5.11)
RDW: 12.4 % (ref 11.5–15.5)
WBC: 11.8 10*3/uL — ABNORMAL HIGH (ref 4.0–10.5)
nRBC: 0 % (ref 0.0–0.2)

## 2020-10-24 LAB — BASIC METABOLIC PANEL
Anion gap: 6 (ref 5–15)
BUN: <5 mg/dL — ABNORMAL LOW (ref 8–23)
CO2: 23 mmol/L (ref 22–32)
Calcium: 8.4 mg/dL — ABNORMAL LOW (ref 8.9–10.3)
Chloride: 111 mmol/L (ref 98–111)
Creatinine, Ser: 0.64 mg/dL (ref 0.44–1.00)
GFR, Estimated: >60 mL/min (ref >60)
Glucose, Bld: 139 mg/dL — ABNORMAL HIGH (ref 70–99)
Potassium: 3.8 mmol/L (ref 3.5–5.1)
Sodium: 140 mmol/L (ref 135–145)

## 2020-10-24 MED ORDER — ZINC OXIDE 40 % EX OINT
TOPICAL_OINTMENT | CUTANEOUS | Status: DC | PRN
  Filled 2020-10-24: qty 57

## 2020-10-24 NOTE — Progress Notes (Signed)
Occupational Therapy Treatment Patient Details Name: [name] MRN: [id number] DOB: Apr 18, 1925 Today's Date: 10/24/2020    History of present illness Patient is a 87 year old female who presented to the hosptial with worsening diarrhea and admitted with AFib w/RVR, and Acute sigmoid diverticulitis. Recent home test-positive covid-19 infection. YBO:FBPZ on eliquis, arthritis, HTN, dementia   OT comments  Patient is a confused 87 year old female who was admitted for above diagnosis. Patient was noted to be less lethargic on this date and able to respond to questions better. Patients speech at times was garbled. Patient continues to request for PPE to be removed during session to be able to see who is visiting her. Patient was max A for supine to sit on edge of bed. Patient was able to sit on edge of bed and wash face/hands and take small sips from boost on tray table. Patient required mod A for sit to stand with [facility] raised with rolling walker. Patient required max A to take small steps to head of bed for repositioning with continuous cues for moving BLE. Patient recommended to transition to SNF at time of d/c to continue to increase safety and independence in ADLs prior to transitioning home.   Follow Up Recommendations  SNF    Equipment Recommendations  3 in 1 bedside commode    Recommendations for Other Services      Precautions / Restrictions Precautions Precautions: Fall Precaution Comments: diarrhea       Mobility Bed Mobility Overal bed mobility: Needs Assistance Bed Mobility: Rolling Rolling: Mod assist   Supine to sit: Mod assist     General bed mobility comments: required multimodal cues for all tasks with increased cues to attend to task.    Transfers Overall transfer level: Needs assistance Equipment used: Rolling walker (2 wheeled) Transfers: Sit to/from Stand Sit to Stand: Mod assist         General transfer comment: patietn required max A to take small  steps towards head of bed with rolling walker and multimodal cues to move each foot    Balance Overall balance assessment: Needs assistance Sitting-balance support: Bilateral upper extremity supported Sitting balance-Leahy Scale: Fair     Standing balance support: Bilateral upper extremity supported Standing balance-Leahy Scale: Poor Standing balance comment: posterior leaning with BUE on walker                           ADL either performed or assessed with clinical judgement   ADL       Grooming: Wash/dry face;Oral care;Set up;Sitting Grooming Details (indicate cue type and reason): patient completed taking sips of boost and washing hands and face sitting on edge of bed with min guard for sitting balance. patient was noted to have one moment of LOB with min a to maintain sitting balance on edge of bed.                             Functional mobility during ADLs: Rolling walker;Cueing for safety;Minimal assistance General ADL Comments: patient required increased cues for all tasks with patient easily distracted. patietn required multimodal cues to complete tasks.                       Cognition Arousal/Alertness: Awake/alert Behavior During Therapy: Flat affect Overall Cognitive Status: Difficult to assess  General Comments: hx of dementia, very HOH                          Pertinent Vitals/ Pain       Pain Assessment: Faces Faces Pain Scale: Hurts a little bit Pain Location: abdomen/back Pain Descriptors / Indicators: Grimacing;Guarding Pain Intervention(s): Limited activity within patient's tolerance;Monitored during session                                                          Frequency  Min 2X/week        Progress Toward Goals  OT Goals(current goals can now be found in the care plan section)  Progress towards OT goals: Progressing toward  goals  Acute Rehab OT Goals OT Goal Formulation: With patient Time For Goal Achievement: 11/05/20 Potential to Achieve Goals: [place] Discharge plan remains appropriate                     AM-PAC OT "6 Clicks" Daily Activity     Outcome Measure   Help from another person eating meals?: A Little Help from another person taking care of personal grooming?: A Little Help from another person toileting, which includes using toliet, bedpan, or urinal?: A Lot Help from another person bathing (including washing, rinsing, drying)?: A Lot Help from another person to put on and taking off regular upper body clothing?: A Lot Help from another person to put on and taking off regular lower body clothing?: A Lot 6 Click Score: 14    End of Session Equipment Utilized During Treatment: Gait belt;Rolling walker  OT Visit Diagnosis: Unsteadiness on feet (R26.81);Pain   Activity Tolerance Patient tolerated treatment well   Patient Left in bed;with call bell/phone within reach;with bed alarm set   Nurse Communication          Time: [phone] OT Time Calculation (min): 22 min  Charges: OT General Charges $OT Visit: 1 Visit OT Treatments $Self Care/Home Management : 8-22 mins  Jackelyn Poling OTR/L, [place] Acute Rehabilitation Department Office# [phone] Pager# [phone]    [place] 10/24/2020, 10:29 AM

## 2020-10-24 NOTE — Care Management Important Message (Signed)
Important Message  Patient Details IM Letter given to the Patient. Name: [name] MRN: [id number] Date of Birth: 07/07/1925   Medicare Important Message Given:  Yes     Kerin Salen 10/24/2020, 2:28 PM

## 2020-10-24 NOTE — Plan of Care (Signed)

## 2020-10-24 NOTE — Progress Notes (Signed)
[name]  HAL:[id number] DOB: [birth year] DOA: 10/21/2020 PCP: Mayra Neer, MD    Brief Narrative:  87 year old with a history of atrial fibrillation on Eliquis, osteoarthritis, HTN, dementia, and positive home COVID test 10/14/2020 status post Paxlovid treatment who presented to the ED 7/11 with worsening diarrhea very poor oral intake fatigue and severe generalized weakness.  In the ED she was found to be in A. fib with RVR.  She was C. difficile negative.  She was hypokalemic at 2.5.  CT abdomen revealed distal colonic air-fluid levels consistent with diarrhea and diverticulosis with low-grade active inflammation.  Consultants:  None  Code Status: NO CODE BLUE  Antimicrobials:  Zosyn  DVT prophylaxis: Eliquis  Subjective: Patient is pleasant and interactive but very confused and cannot provide a reliable history.  Even so she tells me she has not had diarrhea this afternoon. She appears comfortable  Assessment & Plan:  Acute sigmoid diverticulitis No complicating factors noted on CT -continue empiric antibiotic - attempt to advance to full liquid diet   Severe diarrhea C. difficile negative -exam benign -support with IV fluid until improves  COVID-19 infection Negative COVID test in hospital -to remain on isolation for 10 days starting 10/14/2020 (7/14 will be last day of isolation)  Chronic atrial fibrillation with acute RVR RVR now resolved -continue diltiazem and Eliquis  GERD with hiatal hernia Continue PPI  Arthritis on chronic steroids Continue usual steroid dosing  Hypokalemia Due to GI losses - corrected w/ supplementation   Hypomagnesemia Due to GI losses -corrected with supplementation  HTN Blood pressure reasonably controlled  Acute metabolic encephalopathy on chronic dementia  Right lower extremity edema Acute on chronic    Family Communication:  Status is: Inpatient  Remains inpatient appropriate because:Inpatient level of care  appropriate due to severity of illness  Dispo: The patient is from: Home              Anticipated d/c is to: SNF              Patient currently is not medically stable to d/c.   Difficult to place patient No   Objective: Blood pressure (!) 159/62, pulse 77, temperature 98 F (36.7 C), resp. rate 18, height 5' (1.524 m), weight 73.3 kg, SpO2 91 %.  Intake/Output Summary (Last 24 hours) at 10/24/2020 1033 Last data filed at 10/24/2020 0555 Gross per 24 hour  Intake 2516.79 ml  Output 1300 ml  Net 1216.79 ml    Filed Weights   10/21/20 2102  Weight: 73.3 kg    Examination: General: No acute respiratory distress Lungs: CTA B without wheezing Cardiovascular: RRR without murmur or rub Abdomen: NT/ND, soft, BS positive Extremities: No significant edema bilateral lower extremities  CBC: Recent Labs  Lab 10/21/20 1343 10/22/20 0352 10/24/20 0341  WBC 11.7* 10.3 11.8*  HGB 14.1 11.9* 11.4*  HCT 40.5 35.0* 34.8*  MCV 89.8 92.6 94.8  PLT 352 272 735    Basic Metabolic Panel: Recent Labs  Lab 10/21/20 1343 10/22/20 0756 10/23/20 0412 10/24/20 0341  NA 137 141 141 140  K 2.5* 3.0* 3.4* 3.8  CL 100 108 109 111  CO2 23 26 24 23   GLUCOSE 149* 165* 119* 139*  BUN 10 6* 5* <5*  CREATININE 0.64 0.51 0.61 0.64  CALCIUM 9.9 8.5* 8.8* 8.4*  MG 1.9 1.5* 2.1  --     GFR: Estimated Creatinine Clearance: 39.3 mL/min (by C-G formula based on SCr of 0.64 mg/dL).  Liver Function  Tests: Recent Labs  Lab 10/21/20 1343  AST 23  ALT 21  ALKPHOS 75  BILITOT 0.7  PROT 7.6  ALBUMIN 3.7    Recent Labs  Lab 10/21/20 1343  LIPASE 29      Recent Results (from the past 240 hour(s))  Gastrointestinal Panel by PCR , Stool     Status: None   Collection Time: 10/21/20  4:09 PM   Specimen: Stool  Result Value Ref Range Status   Campylobacter species NOT DETECTED NOT DETECTED Final   Plesimonas shigelloides NOT DETECTED NOT DETECTED Final   Salmonella species NOT DETECTED  NOT DETECTED Final   Yersinia enterocolitica NOT DETECTED NOT DETECTED Final   Vibrio species NOT DETECTED NOT DETECTED Final   Vibrio cholerae NOT DETECTED NOT DETECTED Final   Enteroaggregative E coli (EAEC) NOT DETECTED NOT DETECTED Final   Enteropathogenic E coli (EPEC) NOT DETECTED NOT DETECTED Final   Enterotoxigenic E coli (ETEC) NOT DETECTED NOT DETECTED Final   Shiga like toxin producing E coli (STEC) NOT DETECTED NOT DETECTED Final   Shigella/Enteroinvasive E coli (EIEC) NOT DETECTED NOT DETECTED Final   Cryptosporidium NOT DETECTED NOT DETECTED Final   Cyclospora cayetanensis NOT DETECTED NOT DETECTED Final   Entamoeba histolytica NOT DETECTED NOT DETECTED Final   Giardia lamblia NOT DETECTED NOT DETECTED Final   Adenovirus F40/41 NOT DETECTED NOT DETECTED Final   Astrovirus NOT DETECTED NOT DETECTED Final   Norovirus GI/GII NOT DETECTED NOT DETECTED Final   Rotavirus A NOT DETECTED NOT DETECTED Final   Sapovirus (I, II, IV, and V) NOT DETECTED NOT DETECTED Final    Comment: Performed at [facility], [address] [place], [place] [address]  C Difficile Quick Screen w PCR reflex     Status: None   Collection Time: 10/21/20  4:09 PM   Specimen: Stool  Result Value Ref Range Status   C Diff antigen NEGATIVE NEGATIVE Final   C Diff toxin NEGATIVE NEGATIVE Final   C Diff interpretation No C. difficile detected.  Final    Comment: Performed at [facility], [place] [address] [place],  [address]  Resp Panel by RT-PCR (Flu A&B, Covid) Nasopharyngeal Swab     Status: None   Collection Time: 10/21/20  5:22 PM   Specimen: Nasopharyngeal Swab; Nasopharyngeal(NP) swabs in vial transport medium  Result Value Ref Range Status   SARS Coronavirus 2 by RT PCR NEGATIVE NEGATIVE Final    Comment: (NOTE) SARS-CoV-2 target nucleic acids are NOT DETECTED.  The SARS-CoV-2 RNA is generally detectable in upper respiratory specimens during the acute phase of  infection. The lowest concentration of SARS-CoV-2 viral copies this assay can detect is 138 copies/mL. A negative result does not preclude SARS-Cov-2 infection and should not be used as the sole basis for treatment or other patient management decisions. A negative result may occur with  improper specimen collection/handling, submission of specimen other than nasopharyngeal swab, presence of viral mutation(s) within the areas targeted by this assay, and inadequate number of viral copies(<138 copies/mL). A negative result must be combined with clinical observations, patient history, and epidemiological information. The expected result is Negative.  Fact Sheet for Patients:  EntrepreneurPulse.com.au  Fact Sheet for Healthcare Providers:  IncredibleEmployment.be  This test is no t yet approved or cleared by the [place] FDA and  has been authorized for detection and/or diagnosis of SARS-CoV-2 by FDA under an Emergency Use Authorization (EUA). This EUA will remain  in effect (meaning this  test can be used) for the duration of the COVID-19 declaration under Section 564(b)(1) of the Act, 21 U.S.C.section 360bbb-3(b)(1), unless the authorization is terminated  or revoked sooner.       Influenza A by PCR NEGATIVE NEGATIVE Final   Influenza B by PCR NEGATIVE NEGATIVE Final    Comment: (NOTE) The Xpert Xpress SARS-CoV-2/FLU/RSV plus assay is intended as an aid in the diagnosis of influenza from Nasopharyngeal swab specimens and should not be used as a sole basis for treatment. Nasal washings and aspirates are unacceptable for Xpert Xpress SARS-CoV-2/FLU/RSV testing.  Fact Sheet for Patients: EntrepreneurPulse.com.au  Fact Sheet for Healthcare Providers: IncredibleEmployment.be  This test is not yet approved or cleared by the [place] FDA and has been authorized for detection and/or diagnosis of SARS-CoV-2  by FDA under an Emergency Use Authorization (EUA). This EUA will remain in effect (meaning this test can be used) for the duration of the COVID-19 declaration under Section 564(b)(1) of the Act, 21 U.S.C. section 360bbb-3(b)(1), unless the authorization is terminated or revoked.  Performed at [facility], [place] [address], [place], [place] [address]       Scheduled Meds:  (feeding supplement) PROSource Plus  30 mL Oral BID BM   apixaban  5 mg Oral BID   diltiazem  240 mg Oral Daily   feeding supplement  1 Container Oral TID BM   multivitamin with minerals  1 tablet Oral Daily   pantoprazole  40 mg Oral Daily   predniSONE  1.25 mg Oral Q breakfast   sertraline  50 mg Oral q morning   sodium chloride flush  3 mL Intravenous Q12H   Continuous Infusions:  piperacillin-tazobactam (ZOSYN)  IV 3.375 g (10/24/20 0512)   sodium chloride 0.45 % with kcl 75 mL/hr at 10/23/20 2149     LOS: 2 days   Cherene Altes, MD [facility] Office  [phone] Pager - Text Page per Shea Evans  If 7PM-7AM, please contact night-coverage per Amion 10/24/2020, 10:33 AM

## 2020-10-25 LAB — CBC
HCT: 37.1 % (ref 36.0–46.0)
Hemoglobin: 12 g/dL (ref 12.0–15.0)
MCH: 30.5 pg (ref 26.0–34.0)
MCHC: 32.3 g/dL (ref 30.0–36.0)
MCV: 94.4 fL (ref 80.0–100.0)
Platelets: 307 10*3/uL (ref 150–400)
RBC: 3.93 MIL/uL (ref 3.87–5.11)
RDW: 12.5 % (ref 11.5–15.5)
WBC: 10.4 10*3/uL (ref 4.0–10.5)
nRBC: 0 % (ref 0.0–0.2)

## 2020-10-25 LAB — BASIC METABOLIC PANEL
Anion gap: 9 (ref 5–15)
BUN: <5 mg/dL — ABNORMAL LOW (ref 8–23)
CO2: 24 mmol/L (ref 22–32)
Calcium: 8.6 mg/dL — ABNORMAL LOW (ref 8.9–10.3)
Chloride: 105 mmol/L (ref 98–111)
Creatinine, Ser: 0.7 mg/dL (ref 0.44–1.00)
GFR, Estimated: >60 mL/min (ref >60)
Glucose, Bld: 137 mg/dL — ABNORMAL HIGH (ref 70–99)
Potassium: 3.9 mmol/L (ref 3.5–5.1)
Sodium: 138 mmol/L (ref 135–145)

## 2020-10-25 LAB — MAGNESIUM: Magnesium: 1.7 mg/dL (ref 1.7–2.4)

## 2020-10-25 MED ORDER — AMOXICILLIN-POT CLAVULANATE 875-125 MG PO TABS
1.0000 | ORAL_TABLET | Freq: Two times a day (BID) | ORAL | Status: AC
  Administered 2020-10-25 – 2020-10-28 (×6): 1 via ORAL
  Filled 2020-10-25 (×6): qty 1

## 2020-10-25 NOTE — Progress Notes (Signed)
Physical Therapy Treatment Patient Details Name: [name] MRN: [id number] DOB: Aug 12, 1925 Today's Date: 10/25/2020    History of Present Illness Patient is a 87 year old female who presented to the hosptial with worsening diarrhea and admitted with AFib w/RVR, and Acute sigmoid diverticulitis. Recent home test-positive covid-19 infection. YPP:JKDT on eliquis, arthritis, HTN, dementia    PT Comments    Pt more awake/alert today and able to follow simple commands with cues and increased time (pt also very HOH).  Pt assisted to [facility] and then over to recliner.  Pt pleasant and attempting to participate as able.  Pt requiring at least mod assist for transfer at this time so continue to recommend SNF upon d/c.    Follow Up Recommendations  SNF     Equipment Recommendations  None recommended by PT    Recommendations for Other Services       Precautions / Restrictions Precautions Precautions: Fall Precaution Comments: diarrhea    Mobility  Bed Mobility Overal bed mobility: Needs Assistance Bed Mobility: Rolling;Sidelying to Sit Rolling: Mod assist Sidelying to sit: Mod assist       General bed mobility comments: multimodal cues for technique, assist due to weakness    Transfers Overall transfer level: Needs assistance Equipment used: Rolling walker (2 wheeled) Transfers: Sit to/from [facility] Sit to Stand: Mod assist Stand pivot transfers: Mod assist       General transfer comment: multimodal cues for technique, hand placement, weight shifting, stand pivot to [facility] and then 180* turn to recliner; pt reports weakness and fatigue so deferred ambulation  Ambulation/Gait                 Stairs             Wheelchair Mobility    Modified Rankin (Stroke Patients Only)       Balance Overall balance assessment: Needs assistance         Standing balance support: Bilateral upper extremity supported Standing balance-Leahy Scale:  Poor Standing balance comment: reliant on UE support and external assist                            Cognition Arousal/Alertness: Awake/alert Behavior During Therapy: Flat affect Overall Cognitive Status: Difficult to assess                                 General Comments: hx of dementia, very HOH      Exercises      General Comments        Pertinent Vitals/Pain Pain Assessment: Faces Faces Pain Scale: No hurt Pain Intervention(s): Repositioned;Monitored during session    Home Living                      Prior Function            PT Goals (current goals can now be found in the care plan section) Progress towards PT goals: Progressing toward goals    Frequency    Min 2X/week      PT Plan Current plan remains appropriate    Co-evaluation              AM-PAC PT "6 Clicks" Mobility   Outcome Measure  Help needed turning from your back to your side while in a flat bed without using bedrails?: A Lot Help needed moving from  lying on your back to sitting on the side of a flat bed without using bedrails?: A Lot Help needed moving to and from a bed to a chair (including a wheelchair)?: A Lot Help needed standing up from a chair using your arms (e.g., wheelchair or bedside chair)?: A Lot Help needed to walk in hospital room?: Total Help needed climbing 3-5 steps with a railing? : Total 6 Click Score: 10    End of Session Equipment Utilized During Treatment: Gait belt Activity Tolerance: Patient tolerated treatment well Patient left: in chair;with call bell/phone within reach (on chair alarm pad, no alarm box in room; RN and NT aware) Nurse Communication: Mobility status PT Visit Diagnosis: Difficulty in walking, not elsewhere classified (R26.2);Muscle weakness (generalized) (M62.81)     Time: 8833-7445 PT Time Calculation (min) (ACUTE ONLY): 19 min  Charges:  $Therapeutic Activity: 8-22 mins           [name] PT,  DPT Acute Rehabilitation Services Pager: [phone] Office: [phone]   Jawanda Passey,KATHrine E 10/25/2020, 11:32 AM

## 2020-10-25 NOTE — Progress Notes (Signed)
[name]  GLO:[id number] DOB: Aug 17, 1925 DOA: 10/21/2020 PCP: Mayra Neer, MD    Brief Narrative:  [phone] with a history of AF on Eliquis, OA, HTN, dementia, and positive home COVID test 10/14/2020 status post Paxlovid treatment who presented to the ED 7/11 with worsening diarrhea very poor oral intake fatigue and severe generalized weakness. In the ED she was found to be in A. fib with RVR.  She was C. difficile negative.  She was hypokalemic at 2.5.  CT abdomen revealed distal colonic air-fluid levels consistent with diarrhea and diverticulosis with low-grade active inflammation.  Consultants:  None  Code Status: NO CODE BLUE  Antimicrobials:  Zosyn 7/11 >  DVT prophylaxis: Eliquis  Subjective: Afebrile.  Blood pressure trending upward.  Vital signs otherwise stable.  Having semiformed stool today.  Sitting up in a chair in good spirits.  Appears completely comfortable.  Assessment & Plan:  Acute sigmoid diverticulitis No complicating factors noted on CT - continue empiric antibiotic -advancing diet to regular today -transition to oral antibiotics  Severe diarrhea C. difficile negative -exam benign -supported with IV fluid initially -diarrhea has nearly resolved at this time  COVID-19 infection Negative COVID test in hospital - remained on isolation for 10 days (7/4 > 7/14) -now cleared to come off of isolation  Chronic atrial fibrillation with acute RVR RVR resolved /rate controlled - continue diltiazem and Eliquis  GERD with hiatal hernia Continue PPI  Arthritis on chronic steroids Continue usual steroid dosing  Hypokalemia Due to GI losses - corrected w/ supplementation and stable  Hypomagnesemia Due to GI losses -corrected with supplementation  HTN Monitor trend without change today  Acute metabolic encephalopathy on chronic dementia  Right lower extremity edema Acute on chronic    Family Communication: Spoke with daughter at bedside Status is:  Inpatient  Remains inpatient appropriate because:Inpatient level of care appropriate due to severity of illness  Dispo: The patient is from: Home              Anticipated d/c is to: SNF              Patient currently is medically stable to d/c.   Difficult to place patient No   Objective: Blood pressure (!) 161/72, pulse 72, temperature 98.7 F (37.1 C), temperature source Axillary, resp. rate 20, height 5' (1.524 m), weight 73.3 kg, SpO2 94 %.  Intake/Output Summary (Last 24 hours) at 10/25/2020 0923 Last data filed at 10/25/2020 0420 Gross per 24 hour  Intake 1597.17 ml  Output 1300 ml  Net 297.17 ml    Filed Weights   10/21/20 2102  Weight: 73.3 kg    Examination: General: No acute respiratory distress Lungs: CTA B -no wheezing Cardiovascular: RRR without murmur or rub Abdomen: NT/ND, soft, BS positive Extremities: No edema B LE  CBC: Recent Labs  Lab 10/22/20 0352 10/24/20 0341 10/25/20 0343  WBC 10.3 11.8* 10.4  HGB 11.9* 11.4* 12.0  HCT 35.0* 34.8* 37.1  MCV 92.6 94.8 94.4  PLT 272 275 166    Basic Metabolic Panel: Recent Labs  Lab 10/22/20 0756 10/23/20 0412 10/24/20 0341 10/25/20 0343  NA 141 141 140 138  K 3.0* 3.4* 3.8 3.9  CL 108 109 111 105  CO2 26 24 23 24   GLUCOSE 165* 119* 139* 137*  BUN 6* 5* <5* <5*  CREATININE 0.51 0.61 0.64 0.70  CALCIUM 8.5* 8.8* 8.4* 8.6*  MG 1.5* 2.1  --  1.7    GFR: Estimated Creatinine  Clearance: 39.3 mL/min (by C-G formula based on SCr of 0.7 mg/dL).  Liver Function Tests: Recent Labs  Lab 10/21/20 1343  AST 23  ALT 21  ALKPHOS 75  BILITOT 0.7  PROT 7.6  ALBUMIN 3.7    Recent Labs  Lab 10/21/20 1343  LIPASE 29      Recent Results (from the past 240 hour(s))  Gastrointestinal Panel by PCR , Stool     Status: None   Collection Time: 10/21/20  4:09 PM   Specimen: Stool  Result Value Ref Range Status   Campylobacter species NOT DETECTED NOT DETECTED Final   Plesimonas shigelloides NOT  DETECTED NOT DETECTED Final   Salmonella species NOT DETECTED NOT DETECTED Final   Yersinia enterocolitica NOT DETECTED NOT DETECTED Final   Vibrio species NOT DETECTED NOT DETECTED Final   Vibrio cholerae NOT DETECTED NOT DETECTED Final   Enteroaggregative E coli (EAEC) NOT DETECTED NOT DETECTED Final   Enteropathogenic E coli (EPEC) NOT DETECTED NOT DETECTED Final   Enterotoxigenic E coli (ETEC) NOT DETECTED NOT DETECTED Final   Shiga like toxin producing E coli (STEC) NOT DETECTED NOT DETECTED Final   Shigella/Enteroinvasive E coli (EIEC) NOT DETECTED NOT DETECTED Final   Cryptosporidium NOT DETECTED NOT DETECTED Final   Cyclospora cayetanensis NOT DETECTED NOT DETECTED Final   Entamoeba histolytica NOT DETECTED NOT DETECTED Final   Giardia lamblia NOT DETECTED NOT DETECTED Final   Adenovirus F40/41 NOT DETECTED NOT DETECTED Final   Astrovirus NOT DETECTED NOT DETECTED Final   Norovirus GI/GII NOT DETECTED NOT DETECTED Final   Rotavirus A NOT DETECTED NOT DETECTED Final   Sapovirus (I, II, IV, and V) NOT DETECTED NOT DETECTED Final    Comment: Performed at [facility], [address] [place], [place] [address]  C Difficile Quick Screen w PCR reflex     Status: None   Collection Time: 10/21/20  4:09 PM   Specimen: Stool  Result Value Ref Range Status   C Diff antigen NEGATIVE NEGATIVE Final   C Diff toxin NEGATIVE NEGATIVE Final   C Diff interpretation No C. difficile detected.  Final    Comment: Performed at [facility], [place] [address] [place], [place] [address]  Resp Panel by RT-PCR (Flu A&B, Covid) Nasopharyngeal Swab     Status: None   Collection Time: 10/21/20  5:22 PM   Specimen: Nasopharyngeal Swab; Nasopharyngeal(NP) swabs in vial transport medium  Result Value Ref Range Status   SARS Coronavirus 2 by RT PCR NEGATIVE NEGATIVE Final    Comment: (NOTE) SARS-CoV-2 target nucleic acids are NOT DETECTED.  The SARS-CoV-2 RNA is generally  detectable in upper respiratory specimens during the acute phase of infection. The lowest concentration of SARS-CoV-2 viral copies this assay can detect is 138 copies/mL. A negative result does not preclude SARS-Cov-2 infection and should not be used as the sole basis for treatment or other patient management decisions. A negative result may occur with  improper specimen collection/handling, submission of specimen other than nasopharyngeal swab, presence of viral mutation(s) within the areas targeted by this assay, and inadequate number of viral copies(<138 copies/mL). A negative result must be combined with clinical observations, patient history, and epidemiological information. The expected result is Negative.  Fact Sheet for Patients:  EntrepreneurPulse.com.au  Fact Sheet for Healthcare Providers:  IncredibleEmployment.be  This test is no t yet approved or cleared by the [place] FDA and  has been authorized for detection and/or diagnosis of SARS-CoV-2 by FDA  under an Emergency Use Authorization (EUA). This EUA will remain  in effect (meaning this test can be used) for the duration of the COVID-19 declaration under Section 564(b)(1) of the Act, 21 U.S.C.section 360bbb-3(b)(1), unless the authorization is terminated  or revoked sooner.       Influenza A by PCR NEGATIVE NEGATIVE Final   Influenza B by PCR NEGATIVE NEGATIVE Final    Comment: (NOTE) The Xpert Xpress SARS-CoV-2/FLU/RSV plus assay is intended as an aid in the diagnosis of influenza from Nasopharyngeal swab specimens and should not be used as a sole basis for treatment. Nasal washings and aspirates are unacceptable for Xpert Xpress SARS-CoV-2/FLU/RSV testing.  Fact Sheet for Patients: EntrepreneurPulse.com.au  Fact Sheet for Healthcare Providers: IncredibleEmployment.be  This test is not yet approved or cleared by the [place] FDA  and has been authorized for detection and/or diagnosis of SARS-CoV-2 by FDA under an Emergency Use Authorization (EUA). This EUA will remain in effect (meaning this test can be used) for the duration of the COVID-19 declaration under Section 564(b)(1) of the Act, 21 U.S.C. section 360bbb-3(b)(1), unless the authorization is terminated or revoked.  Performed at [facility], [place] [address] [place], [place] [address]       Scheduled Meds:  (feeding supplement) PROSource Plus  30 mL Oral BID BM   apixaban  5 mg Oral BID   diltiazem  240 mg Oral Daily   feeding supplement  1 Container Oral TID BM   multivitamin with minerals  1 tablet Oral Daily   pantoprazole  40 mg Oral Daily   predniSONE  1.25 mg Oral Q breakfast   sertraline  50 mg Oral q morning   sodium chloride flush  3 mL Intravenous Q12H   Continuous Infusions:  piperacillin-tazobactam (ZOSYN)  IV 3.375 g (10/25/20 0509)   sodium chloride 0.45 % with kcl 75 mL/hr at 10/25/20 0420     LOS: 3 days   Cherene Altes, MD [facility] Office  [phone] Pager - Text Page per Shea Evans  If 7PM-7AM, please contact night-coverage per Amion 10/25/2020, 9:23 AM

## 2020-10-25 NOTE — Progress Notes (Signed)
Pharmacy Antibiotic Note  [name] is a 87 y.o. female admitted on 10/21/2020 with  diverticulitis .  Pharmacy has been consulted for Zosyn dosing.   Plan: Continue Zosyn 3.375 g EI q 8 hours Will sign off and follow remotely Thank you for allowing pharmacy to participate in this patient's care.    Height: 5' (152.4 cm) Weight: 73.3 kg (161 lb 11.2 oz) IBW/kg (Calculated) : 45.5  Temp (24hrs), Avg:98.2 F (36.8 C), Min:97.8 F (36.6 C), Max:98.7 F (37.1 C)  Recent Labs  Lab 10/21/20 1343 10/22/20 0352 10/22/20 0756 10/23/20 0412 10/24/20 0341 10/25/20 0343  WBC 11.7* 10.3  --   --  11.8* 10.4  CREATININE 0.64  --  0.51 0.61 0.64 0.70     Estimated Creatinine Clearance: 39.3 mL/min (by C-G formula based on SCr of 0.7 mg/dL).    Allergies  Allergen Reactions   Arimidex [Anastrozole] Other (See Comments)    Neck cramping   Fish Allergy Other (See Comments)    Patient prefers to NOT eat this   Morphine And Related Nausea And Vomiting   Shellfish Allergy Other (See Comments)    Patient prefers to NOT eat this   Vicodin [Hydrocodone-Acetaminophen] Nausea Only    DOES tolerate Vicodin/Norco, however   Napoleon Form, PharmD, BCPS 10/25/2020 10:56 AM

## 2020-10-25 NOTE — TOC Progression Note (Addendum)
Transition of Care [facility]) - Progression Note    Patient Details  Name: CHARIZMA GARDINER MRN: [id number] Date of Birth: [birth year]  Transition of Care [facility] CM/SW Contact  Ross Ludwig, [place] Phone Number: 10/25/2020, 12:32 PM  Clinical Narrative:     Per physician, patient was Covid+ on July 4th via at home test.  Patient has been faxed out to SNFs for short term rehab, patient will need [facility].  2:00pm  CSW received message from [facility] they can a  accept patient pending insurance authorization through [facility].  SNF to begin auth, per SNF, probably will not receive auth over weekend.  If auth received snf will contact weekend TOC, most likely auth not received till Monday.  CSW updated patient's daughter, and attending phyciain both aware of having to wait for insurance auth.    Expected Discharge Plan: [place] Barriers to Discharge: Continued Medical Work up  Expected Discharge Plan and Services Expected Discharge Plan: [place] In-house Referral: Clinical Social Work   Post Acute Care Choice: [place] Living arrangements for the past 2 months: Single Family Home                                       Social Determinants of Health (SDOH) Interventions    Readmission Risk Interventions No flowsheet data found.

## 2020-10-26 MED ORDER — LOSARTAN POTASSIUM 50 MG PO TABS
50.0000 mg | ORAL_TABLET | Freq: Every day | ORAL | Status: DC
  Administered 2020-10-26 – 2020-10-28 (×3): 50 mg via ORAL
  Filled 2020-10-26 (×3): qty 1

## 2020-10-26 NOTE — Progress Notes (Signed)
[name]  VPX:[id number] DOB: 07/28/1925 DOA: 10/21/2020 PCP: Mayra Neer, MD    Brief Narrative:  [phone] with a history of AF on Eliquis, OA, HTN, dementia, and positive home COVID test 10/14/2020 status post Paxlovid treatment who presented to the ED 7/11 with worsening diarrhea very poor oral intake fatigue and severe generalized weakness. In the ED she was found to be in A. fib with RVR.  She was C. difficile negative.  She was hypokalemic at 2.5.  CT abdomen revealed distal colonic air-fluid levels consistent with diarrhea and diverticulosis with low-grade active inflammation.  Consultants:  None  Code Status: NO CODE BLUE  Antimicrobials:  Zosyn 7/11 >  DVT prophylaxis: Eliquis  Subjective: Resting comfortably in bedside chair.  No new issues today.  Awaiting SNF placement.  Assessment & Plan:  Acute sigmoid diverticulitis No complicating factors noted on CT - advanced diet to regular - transitioned to oral antibiotics - medically stable for d/c   Severe diarrhea Due to above - diarrhea has resolved   COVID-19 infection Negative COVID test in hospital - + home test on 10/14/2020 - remained on isolation for 10 days (7/4 > 7/14) - cleared to come off of isolation 10/25/20 AM  Chronic atrial fibrillation with acute RVR RVR resolved /rate controlled - continue diltiazem and Eliquis  GERD with hiatal hernia Continue PPI  Arthritis on chronic steroids Continuing usual outpatient steroid dosing  Hypokalemia Due to GI losses - corrected w/ supplementation and stable  Hypomagnesemia Due to GI losses -corrected with supplementation  HTN BP trending upward - adjust medical tx today   Acute metabolic encephalopathy on chronic dementia Appears to have returned to her baseline mental status   Right lower extremity edema Acute on chronic - stable   Dispo Medically cleared for d/c to SNF - bed available/accepted at [facility] - awaiting insurance authorization  as of 10/25/20   Family Communication:  Status is: Inpatient  Remains inpatient appropriate because:Inpatient level of care appropriate due to severity of illness  Dispo: The patient is from: Home              Anticipated d/c is to: SNF              Patient currently is medically stable to d/c.   Difficult to place patient No   Objective: Blood pressure (!) 170/72, pulse 71, temperature (!) 97.5 F (36.4 C), temperature source Oral, resp. rate 16, height 5' (1.524 m), weight 73.3 kg, SpO2 100 %.  Intake/Output Summary (Last 24 hours) at 10/26/2020 0949 Last data filed at 10/26/2020 0600 Gross per 24 hour  Intake 740 ml  Output 800 ml  Net -60 ml    Filed Weights   10/21/20 2102  Weight: 73.3 kg    Examination: General: No acute respiratory distress Lungs: CTA B  Cardiovascular: RRR without murmur or rub Abdomen: NT/ND, soft, BS positive Extremities: No edema B lower extremities  CBC: Recent Labs  Lab 10/22/20 0352 10/24/20 0341 10/25/20 0343  WBC 10.3 11.8* 10.4  HGB 11.9* 11.4* 12.0  HCT 35.0* 34.8* 37.1  MCV 92.6 94.8 94.4  PLT 272 275 035    Basic Metabolic Panel: Recent Labs  Lab 10/22/20 0756 10/23/20 0412 10/24/20 0341 10/25/20 0343  NA 141 141 140 138  K 3.0* 3.4* 3.8 3.9  CL 108 109 111 105  CO2 26 24 23 24   GLUCOSE 165* 119* 139* 137*  BUN 6* 5* <5* <5*  CREATININE 0.51  0.61 0.64 0.70  CALCIUM 8.5* 8.8* 8.4* 8.6*  MG 1.5* 2.1  --  1.7    GFR: Estimated Creatinine Clearance: 39.3 mL/min (by C-G formula based on SCr of 0.7 mg/dL).  Liver Function Tests: Recent Labs  Lab 10/21/20 1343  AST 23  ALT 21  ALKPHOS 75  BILITOT 0.7  PROT 7.6  ALBUMIN 3.7    Recent Labs  Lab 10/21/20 1343  LIPASE 29      Recent Results (from the past 240 hour(s))  Gastrointestinal Panel by PCR , Stool     Status: None   Collection Time: 10/21/20  4:09 PM   Specimen: Stool  Result Value Ref Range Status   Campylobacter species NOT DETECTED NOT  DETECTED Final   Plesimonas shigelloides NOT DETECTED NOT DETECTED Final   Salmonella species NOT DETECTED NOT DETECTED Final   Yersinia enterocolitica NOT DETECTED NOT DETECTED Final   Vibrio species NOT DETECTED NOT DETECTED Final   Vibrio cholerae NOT DETECTED NOT DETECTED Final   Enteroaggregative E coli (EAEC) NOT DETECTED NOT DETECTED Final   Enteropathogenic E coli (EPEC) NOT DETECTED NOT DETECTED Final   Enterotoxigenic E coli (ETEC) NOT DETECTED NOT DETECTED Final   Shiga like toxin producing E coli (STEC) NOT DETECTED NOT DETECTED Final   Shigella/Enteroinvasive E coli (EIEC) NOT DETECTED NOT DETECTED Final   Cryptosporidium NOT DETECTED NOT DETECTED Final   Cyclospora cayetanensis NOT DETECTED NOT DETECTED Final   Entamoeba histolytica NOT DETECTED NOT DETECTED Final   Giardia lamblia NOT DETECTED NOT DETECTED Final   Adenovirus F40/41 NOT DETECTED NOT DETECTED Final   Astrovirus NOT DETECTED NOT DETECTED Final   Norovirus GI/GII NOT DETECTED NOT DETECTED Final   Rotavirus A NOT DETECTED NOT DETECTED Final   Sapovirus (I, II, IV, and V) NOT DETECTED NOT DETECTED Final    Comment: Performed at [facility], [place] [place], [place] [address]  C Difficile Quick Screen w PCR reflex     Status: None   Collection Time: 10/21/20  4:09 PM   Specimen: Stool  Result Value Ref Range Status   C Diff antigen NEGATIVE NEGATIVE Final   C Diff toxin NEGATIVE NEGATIVE Final   C Diff interpretation No C. difficile detected.  Final    Comment: Performed at [facility], [place] [address], [place], [place] [address]  Resp Panel by RT-PCR (Flu A&B, Covid) Nasopharyngeal Swab     Status: None   Collection Time: 10/21/20  5:22 PM   Specimen: Nasopharyngeal Swab; Nasopharyngeal(NP) swabs in vial transport medium  Result Value Ref Range Status   SARS Coronavirus 2 by RT PCR NEGATIVE NEGATIVE Final    Comment: (NOTE) SARS-CoV-2 target nucleic acids are NOT  DETECTED.  The SARS-CoV-2 RNA is generally detectable in upper respiratory specimens during the acute phase of infection. The lowest concentration of SARS-CoV-2 viral copies this assay can detect is 138 copies/mL. A negative result does not preclude SARS-Cov-2 infection and should not be used as the sole basis for treatment or other patient management decisions. A negative result may occur with  improper specimen collection/handling, submission of specimen other than nasopharyngeal swab, presence of viral mutation(s) within the areas targeted by this assay, and inadequate number of viral copies(<138 copies/mL). A negative result must be combined with clinical observations, patient history, and epidemiological information. The expected result is Negative.  Fact Sheet for Patients:  EntrepreneurPulse.com.au  Fact Sheet for Healthcare Providers:  IncredibleEmployment.be  This test is no  t yet approved or cleared by the [place] and  has been authorized for detection and/or diagnosis of SARS-CoV-2 by FDA under an Emergency Use Authorization (EUA). This EUA will remain  in effect (meaning this test can be used) for the duration of the COVID-19 declaration under Section 564(b)(1) of the Act, 21 U.S.C.section 360bbb-3(b)(1), unless the authorization is terminated  or revoked sooner.       Influenza A by PCR NEGATIVE NEGATIVE Final   Influenza B by PCR NEGATIVE NEGATIVE Final    Comment: (NOTE) The Xpert Xpress SARS-CoV-2/FLU/RSV plus assay is intended as an aid in the diagnosis of influenza from Nasopharyngeal swab specimens and should not be used as a sole basis for treatment. Nasal washings and aspirates are unacceptable for Xpert Xpress SARS-CoV-2/FLU/RSV testing.  Fact Sheet for Patients: EntrepreneurPulse.com.au  Fact Sheet for Healthcare Providers: IncredibleEmployment.be  This test is not yet  approved or cleared by the [place] FDA and has been authorized for detection and/or diagnosis of SARS-CoV-2 by FDA under an Emergency Use Authorization (EUA). This EUA will remain in effect (meaning this test can be used) for the duration of the COVID-19 declaration under Section 564(b)(1) of the Act, 21 U.S.C. section 360bbb-3(b)(1), unless the authorization is terminated or revoked.  Performed at [facility], [place] [address] [place], [place] [address]       Scheduled Meds:  (feeding supplement) PROSource Plus  30 mL Oral BID BM   amoxicillin-clavulanate  1 tablet Oral Q12H   apixaban  5 mg Oral BID   diltiazem  240 mg Oral Daily   feeding supplement  1 Container Oral TID BM   multivitamin with minerals  1 tablet Oral Daily   pantoprazole  40 mg Oral Daily   predniSONE  1.25 mg Oral Q breakfast   sertraline  50 mg Oral q morning   sodium chloride flush  3 mL Intravenous Q12H     LOS: 4 days   Cherene Altes, MD [facility] Office  [phone] Pager - Text Page per Shea Evans  If 7PM-7AM, please contact night-coverage per Amion 10/26/2020, 9:49 AM

## 2020-10-27 NOTE — Progress Notes (Signed)
[name]  FOY:[id number] DOB: 13-Nov-1925 DOA: 10/21/2020 PCP: Mayra Neer, MD    Brief Narrative:  [phone] with a history of AF on Eliquis, OA, HTN, dementia, and positive home COVID test 10/14/2020 status post Paxlovid treatment who presented to the ED 7/11 with worsening diarrhea very poor oral intake fatigue and severe generalized weakness. In the ED she was found to be in A. fib with RVR.  She was C. difficile negative.  She was hypokalemic at 2.5.  CT abdomen revealed distal colonic air-fluid levels consistent with diarrhea and diverticulosis with low-grade active inflammation.  Consultants:  None  Code Status: NO CODE BLUE  Antimicrobials:  Zosyn 7/11 >  DVT prophylaxis: Eliquis  Subjective: Resting comfortably in a bedside chair.  Quite pleasant.  Interactive but confused.  No evidence of discomfort or distress.  Assessment & Plan:  Acute sigmoid diverticulitis No complicating factors noted on CT - advanced diet to regular without difficulty - transitioned to oral antibiotics -remains medically stable for d/c   Severe diarrhea Due to above - diarrhea has resolved   COVID-19 infection Negative COVID test in hospital - + home test on 10/14/2020 - remained on isolation for 10 days (7/4 > 7/14) - cleared to come off of isolation 10/25/20 AM  Chronic atrial fibrillation with acute RVR RVR resolved /rate controlled - continue diltiazem and Eliquis  GERD with hiatal hernia Continue PPI  Arthritis on chronic steroids Continuing usual outpatient steroid dosing  Hypokalemia Due to GI losses - corrected w/ supplementation and stable  Hypomagnesemia Due to GI losses -corrected with supplementation  HTN BP trending upward - adjust medical tx today   Acute metabolic encephalopathy on chronic dementia Appears to have returned to her baseline mental status   Right lower extremity edema Acute on chronic - stable   Dispo Medically cleared for d/c to SNF - bed  available/accepted at [facility] - awaiting insurance authorization as of 10/25/20   Family Communication:  Status is: Inpatient  Remains inpatient appropriate because:Inpatient level of care appropriate due to severity of illness  Dispo: The patient is from: Home              Anticipated d/c is to: SNF              Patient currently is medically stable to d/c.   Difficult to place patient No   Objective: Blood pressure (!) 148/61, pulse 70, temperature 98 F (36.7 C), temperature source Oral, resp. rate 18, height 5' (1.524 m), weight 73.3 kg, SpO2 97 %.  Intake/Output Summary (Last 24 hours) at 10/27/2020 1032 Last data filed at 10/26/2020 2000 Gross per 24 hour  Intake 240 ml  Output --  Net 240 ml    Filed Weights   10/21/20 2102  Weight: 73.3 kg    Examination: General: No acute respiratory distress Lungs: CTA B without wheezing Cardiovascular: RRR  Abdomen: NT/ND, soft, BS positive Extremities: No edema B LE  CBC: Recent Labs  Lab 10/22/20 0352 10/24/20 0341 10/25/20 0343  WBC 10.3 11.8* 10.4  HGB 11.9* 11.4* 12.0  HCT 35.0* 34.8* 37.1  MCV 92.6 94.8 94.4  PLT 272 275 094    Basic Metabolic Panel: Recent Labs  Lab 10/22/20 0756 10/23/20 0412 10/24/20 0341 10/25/20 0343  NA 141 141 140 138  K 3.0* 3.4* 3.8 3.9  CL 108 109 111 105  CO2 26 24 23 24   GLUCOSE 165* 119* 139* 137*  BUN 6* 5* <5* <5*  CREATININE 0.51 0.61 0.64 0.70  CALCIUM 8.5* 8.8* 8.4* 8.6*  MG 1.5* 2.1  --  1.7    GFR: Estimated Creatinine Clearance: 39.3 mL/min (by C-G formula based on SCr of 0.7 mg/dL).  Liver Function Tests: Recent Labs  Lab 10/21/20 1343  AST 23  ALT 21  ALKPHOS 75  BILITOT 0.7  PROT 7.6  ALBUMIN 3.7    Recent Labs  Lab 10/21/20 1343  LIPASE 29      Recent Results (from the past 240 hour(s))  Gastrointestinal Panel by PCR , Stool     Status: None   Collection Time: 10/21/20  4:09 PM   Specimen: Stool  Result Value Ref Range Status    Campylobacter species NOT DETECTED NOT DETECTED Final   Plesimonas shigelloides NOT DETECTED NOT DETECTED Final   Salmonella species NOT DETECTED NOT DETECTED Final   Yersinia enterocolitica NOT DETECTED NOT DETECTED Final   Vibrio species NOT DETECTED NOT DETECTED Final   Vibrio cholerae NOT DETECTED NOT DETECTED Final   Enteroaggregative E coli (EAEC) NOT DETECTED NOT DETECTED Final   Enteropathogenic E coli (EPEC) NOT DETECTED NOT DETECTED Final   Enterotoxigenic E coli (ETEC) NOT DETECTED NOT DETECTED Final   Shiga like toxin producing E coli (STEC) NOT DETECTED NOT DETECTED Final   Shigella/Enteroinvasive E coli (EIEC) NOT DETECTED NOT DETECTED Final   Cryptosporidium NOT DETECTED NOT DETECTED Final   Cyclospora cayetanensis NOT DETECTED NOT DETECTED Final   Entamoeba histolytica NOT DETECTED NOT DETECTED Final   Giardia lamblia NOT DETECTED NOT DETECTED Final   Adenovirus F40/41 NOT DETECTED NOT DETECTED Final   Astrovirus NOT DETECTED NOT DETECTED Final   Norovirus GI/GII NOT DETECTED NOT DETECTED Final   Rotavirus A NOT DETECTED NOT DETECTED Final   Sapovirus (I, II, IV, and V) NOT DETECTED NOT DETECTED Final    Comment: Performed at [facility], [address] [place], [place] [address]  C Difficile Quick Screen w PCR reflex     Status: None   Collection Time: 10/21/20  4:09 PM   Specimen: Stool  Result Value Ref Range Status   C Diff antigen NEGATIVE NEGATIVE Final   C Diff toxin NEGATIVE NEGATIVE Final   C Diff interpretation No C. difficile detected.  Final    Comment: Performed at [facility], [place] [address], [place], [place] [address]  Resp Panel by RT-PCR (Flu A&B, Covid) Nasopharyngeal Swab     Status: None   Collection Time: 10/21/20  5:22 PM   Specimen: Nasopharyngeal Swab; Nasopharyngeal(NP) swabs in vial transport medium  Result Value Ref Range Status   SARS Coronavirus 2 by RT PCR NEGATIVE NEGATIVE Final    Comment:  (NOTE) SARS-CoV-2 target nucleic acids are NOT DETECTED.  The SARS-CoV-2 RNA is generally detectable in upper respiratory specimens during the acute phase of infection. The lowest concentration of SARS-CoV-2 viral copies this assay can detect is 138 copies/mL. A negative result does not preclude SARS-Cov-2 infection and should not be used as the sole basis for treatment or other patient management decisions. A negative result may occur with  improper specimen collection/handling, submission of specimen other than nasopharyngeal swab, presence of viral mutation(s) within the areas targeted by this assay, and inadequate number of viral copies(<138 copies/mL). A negative result must be combined with clinical observations, patient history, and epidemiological information. The expected result is Negative.  Fact Sheet for Patients:  EntrepreneurPulse.com.au  Fact Sheet for Healthcare Providers:  IncredibleEmployment.be  This test  is no t yet approved or cleared by the [place] and  has been authorized for detection and/or diagnosis of SARS-CoV-2 by FDA under an Emergency Use Authorization (EUA). This EUA will remain  in effect (meaning this test can be used) for the duration of the COVID-19 declaration under Section 564(b)(1) of the Act, 21 U.S.C.section 360bbb-3(b)(1), unless the authorization is terminated  or revoked sooner.       Influenza A by PCR NEGATIVE NEGATIVE Final   Influenza B by PCR NEGATIVE NEGATIVE Final    Comment: (NOTE) The Xpert Xpress SARS-CoV-2/FLU/RSV plus assay is intended as an aid in the diagnosis of influenza from Nasopharyngeal swab specimens and should not be used as a sole basis for treatment. Nasal washings and aspirates are unacceptable for Xpert Xpress SARS-CoV-2/FLU/RSV testing.  Fact Sheet for Patients: EntrepreneurPulse.com.au  Fact Sheet for Healthcare  Providers: IncredibleEmployment.be  This test is not yet approved or cleared by the [place] FDA and has been authorized for detection and/or diagnosis of SARS-CoV-2 by FDA under an Emergency Use Authorization (EUA). This EUA will remain in effect (meaning this test can be used) for the duration of the COVID-19 declaration under Section 564(b)(1) of the Act, 21 U.S.C. section 360bbb-3(b)(1), unless the authorization is terminated or revoked.  Performed at [facility], [place] [address] [place], [place] [address]       Scheduled Meds:  (feeding supplement) PROSource Plus  30 mL Oral BID BM   amoxicillin-clavulanate  1 tablet Oral Q12H   apixaban  5 mg Oral BID   diltiazem  240 mg Oral Daily   feeding supplement  1 Container Oral TID BM   losartan  50 mg Oral Daily   multivitamin with minerals  1 tablet Oral Daily   pantoprazole  40 mg Oral Daily   predniSONE  1.25 mg Oral Q breakfast   sertraline  50 mg Oral q morning   sodium chloride flush  3 mL Intravenous Q12H     LOS: 5 days   Cherene Altes, MD [facility] Office  [phone] Pager - Text Page per Shea Evans  If 7PM-7AM, please contact night-coverage per Amion 10/27/2020, 10:32 AM

## 2020-10-27 NOTE — Progress Notes (Signed)
   10/27/20 1400  What Happened  Was fall witnessed? No  Was patient injured? No  Patient found on floor  Found by Staff-comment  Stated prior activity to/from bed, chair, or stretcher  Follow Up  MD notified Dr Thereasa Solo  Time MD notified [phone]  Family notified Yes - comment  Time family notified 1420 (left a VM to daughter)  Additional tests No  Progress note created (see row info) Yes  Adult Fall Risk Assessment  Risk Factor Category (scoring not indicated) History of more than one fall within 6 months before admission (document High fall risk)  Patient Fall Risk Level High fall risk  Adult Fall Risk Interventions  Required Bundle Interventions *See Row Information* High fall risk - low, moderate, and high requirements implemented  Additional Interventions PT/OT need assessed if change in mobility from baseline  Screening for Fall Injury Risk (To be completed on HIGH fall risk patients) - Assessing Need for Floor Mats  Risk For Fall Injury- Criteria for Floor Mats None identified - No additional interventions needed  Will Implement Floor Mats Yes  Vitals  Temp 98 F (36.7 C)  Temp Source Axillary  BP (!) 141/65  BP Location Right Arm  BP Method Automatic  Patient Position (if appropriate) Sitting  Pulse Rate 69  Resp 20  Oxygen Therapy  SpO2 98 %  O2 Device Room Air  Pain Assessment  Pain Scale 0-10  Pain Score 0  Faces Pain Scale 0  Neurological  Level of Consciousness Alert

## 2020-10-28 DIAGNOSIS — Z8616 Personal history of COVID-19: Secondary | ICD-10-CM | POA: Diagnosis not present

## 2020-10-28 DIAGNOSIS — I4891 Unspecified atrial fibrillation: Secondary | ICD-10-CM | POA: Diagnosis not present

## 2020-10-28 DIAGNOSIS — K219 Gastro-esophageal reflux disease without esophagitis: Secondary | ICD-10-CM | POA: Diagnosis not present

## 2020-10-28 DIAGNOSIS — K810 Acute cholecystitis: Secondary | ICD-10-CM | POA: Diagnosis not present

## 2020-10-28 DIAGNOSIS — K5190 Ulcerative colitis, unspecified, without complications: Secondary | ICD-10-CM | POA: Diagnosis not present

## 2020-10-28 DIAGNOSIS — K819 Cholecystitis, unspecified: Secondary | ICD-10-CM | POA: Diagnosis not present

## 2020-10-28 DIAGNOSIS — F0390 Unspecified dementia without behavioral disturbance: Secondary | ICD-10-CM | POA: Diagnosis not present

## 2020-10-28 DIAGNOSIS — K5793 Diverticulitis of intestine, part unspecified, without perforation or abscess with bleeding: Secondary | ICD-10-CM | POA: Diagnosis not present

## 2020-10-28 DIAGNOSIS — Z7401 Bed confinement status: Secondary | ICD-10-CM | POA: Diagnosis not present

## 2020-10-28 DIAGNOSIS — I959 Hypotension, unspecified: Secondary | ICD-10-CM | POA: Diagnosis not present

## 2020-10-28 DIAGNOSIS — R4182 Altered mental status, unspecified: Secondary | ICD-10-CM | POA: Diagnosis not present

## 2020-10-28 DIAGNOSIS — I10 Essential (primary) hypertension: Secondary | ICD-10-CM | POA: Diagnosis not present

## 2020-10-28 DIAGNOSIS — K5792 Diverticulitis of intestine, part unspecified, without perforation or abscess without bleeding: Secondary | ICD-10-CM | POA: Diagnosis not present

## 2020-10-28 DIAGNOSIS — I480 Paroxysmal atrial fibrillation: Secondary | ICD-10-CM | POA: Diagnosis not present

## 2020-10-28 DIAGNOSIS — R410 Disorientation, unspecified: Secondary | ICD-10-CM | POA: Diagnosis not present

## 2020-10-28 MED ORDER — LOPERAMIDE HCL 2 MG PO CAPS: 2.0000 mg | ORAL_CAPSULE | ORAL | 0 refills | Status: AC | PRN

## 2020-10-28 MED ORDER — LOSARTAN POTASSIUM 50 MG PO TABS: 50.0000 mg | ORAL_TABLET | Freq: Every day | ORAL | Status: AC

## 2020-10-28 MED ORDER — ACETAMINOPHEN 325 MG PO TABS: 650.0000 mg | ORAL_TABLET | Freq: Four times a day (QID) | ORAL | Status: AC | PRN

## 2020-10-28 MED ORDER — DILTIAZEM HCL ER COATED BEADS 240 MG PO CP24: 240.0000 mg | ORAL_CAPSULE | Freq: Every day | ORAL | Status: AC

## 2020-10-28 NOTE — Plan of Care (Signed)
  Problem: Activity: Goal: Risk for activity intolerance will decrease Outcome: Adequate for Discharge   Problem: Nutrition: Goal: Adequate nutrition will be maintained Outcome: Adequate for Discharge   Problem: Elimination: Goal: Will not experience complications related to bowel motility Outcome: Adequate for Discharge   Problem: Skin Integrity: Goal: Risk for impaired skin integrity will decrease Outcome: Adequate for Discharge   

## 2020-10-28 NOTE — Progress Notes (Signed)
Attempted to call again for report at [facility] and left a VM for call back number if the nurse wants a report.

## 2020-10-28 NOTE — Care Management Important Message (Signed)
Important Message  Patient Details IM Letter placed in Patient's room. Name: [name] MRN: [id number] Date of Birth: 04-26-1925   Medicare Important Message Given:  Yes     Kerin Salen 10/28/2020, 11:12 AM

## 2020-10-28 NOTE — Progress Notes (Signed)
Attempted to call report at [facility], was transferred to Nurse's station but no one answered the call. Wii attempt to call agin later. Pt is waiting for [facility] transport.

## 2020-10-28 NOTE — TOC Progression Note (Signed)
Transition of Care [facility]) - Progression Note    Patient Details  Name: [name] MRN: [id number] Date of Birth: July 12, 1925  [facility]) CM/SW Contact  Purcell Mouton, RN Phone Number: 10/28/2020, 1:23 PM  Clinical Narrative:    Spoke with pt's daughter concerning discharge to [facility]. [name] was was okay with pt going today and asked that she be called when [facility] come to transport pt. RN was made aware.    Expected Discharge Plan: Group Home Barriers to Discharge: Continued Medical Work up  Expected Discharge Plan and Services Expected Discharge Plan: Group Home In-house Referral: Clinical Social Work   Post Acute Care Choice: [place] Living arrangements for the past 2 months: Group Home Expected Discharge Date: 10/28/20                 DME Agency: [employer]                   Social Determinants of Health (SDOH) Interventions    Readmission Risk Interventions No flowsheet data found.

## 2020-10-28 NOTE — Progress Notes (Signed)
Physical Therapy Treatment Patient Details Name: [name] MRN: [id number] DOB: 1926/03/01 Today's Date: 10/28/2020    History of Present Illness Patient is a 87 year old female who presented to the hosptial with worsening diarrhea and admitted with AFib w/RVR, and Acute sigmoid diverticulitis. Recent home test-positive covid-19 infection. EVO:JJKK on eliquis, arthritis, HTN, dementia    PT Comments    Patient is very lethargic, Hand over hand cues to move legs, sit up onto bed edge. Patient did stand and took small steps to move to recliner, eyes closed.  Continue PT .   Follow Up Recommendations  SNF     Equipment Recommendations  None recommended by PT    Recommendations for Other Services       Precautions / Restrictions Precautions Precautions: Fall Precaution Comments: diarrhea    Mobility  Bed Mobility Overal bed mobility: Needs Assistance Bed Mobility: Rolling;Sidelying to Sit Rolling: Max assist Sidelying to sit: Max assist Supine to sit: HOB elevated     General bed mobility comments: constant cueing to move legs, assisted to sit up with max assist, patient very lethargic    Transfers Overall transfer level: Needs assistance Equipment used:  (bear hug transfer) Transfers: Sit to/from [facility] Sit to Stand: Mod assist Stand pivot transfers: Mod assist       General transfer comment: multimodal cues to stnad,  amazingly stood up, took small steps to rcliner.  Ambulation/Gait                 Stairs             Wheelchair Mobility    Modified Rankin (Stroke Patients Only)       Balance   Sitting-balance support: Bilateral upper extremity supported Sitting balance-Leahy Scale: Poor Sitting balance - Comments: very lethargic                                    Cognition Arousal/Alertness: Lethargic                                     General Comments: patient barely  aroused with stimulation      Exercises      General Comments        Pertinent Vitals/Pain Faces Pain Scale: Hurts little more Pain Location: abdomen/back Pain Descriptors / Indicators: Grimacing;Guarding;Discomfort;Moaning    Home Living                      Prior Function            PT Goals (current goals can now be found in the care plan section) Progress towards PT goals: Not progressing toward goals - comment (lethargic today)    Frequency    Min 2X/week      PT Plan Current plan remains appropriate    Co-evaluation              AM-PAC PT "6 Clicks" Mobility   Outcome Measure  Help needed turning from your back to your side while in a flat bed without using bedrails?: A Lot Help needed moving from lying on your back to sitting on the side of a flat bed without using bedrails?: A Lot Help needed moving to and from a bed to a chair (including a wheelchair)?: A Lot Help needed standing  up from a chair using your arms (e.g., wheelchair or bedside chair)?: A Lot Help needed to walk in hospital room?: Total Help needed climbing 3-5 steps with a railing? : Total 6 Click Score: 10    End of Session Equipment Utilized During Treatment: Gait belt Activity Tolerance: Patient limited by lethargy Patient left: in chair;with call bell/phone within reach;with chair alarm set Nurse Communication: Mobility status PT Visit Diagnosis: Difficulty in walking, not elsewhere classified (R26.2);Muscle weakness (generalized) (M62.81)     Time: 2841-3244 PT Time Calculation (min) (ACUTE ONLY): 16 min  Charges:  $Therapeutic Activity: 8-22 mins                     Tresa Endo PT Acute Rehabilitation Services Pager [phone] Office [phone]    Claretha Cooper 10/28/2020, 1:33 PM

## 2020-10-28 NOTE — Discharge Summary (Signed)
DISCHARGE SUMMARY  [name]  MR#: [id number]  DOB:07/06/1925  Date of Admission: 10/21/2020 Date of Discharge: 10/28/2020  Attending Physician:Brylan Seubert Hennie Duos, MD  Patient's OEV:OJJK, [name], MD  Consults: none   Disposition: D/C to SNF   Date of Positive COVID Test: 10/14/2020  Date Quarantine Ended: 10/25/2020  COVID-19 specific Treatment: None necessary as inpatient   Follow-up Appts:  Contact information for follow-up providers     Mayra Neer, MD. Go in 3 week(s).   Specialty: Family Medicine Why: Schedule an appointment for a visit after you are released from your SNF [facility]. Contact information: [address] [place] [address] [phone]              Contact information for after-discharge care     [facility] .   Service: Skilled Nursing Contact information: [address] [place] [phone]                     Discharge Diagnoses: Acute sigmoid diverticulitis Severe diarrhea COVID-19 infection Chronic atrial fibrillation with acute RVR GERD with hiatal hernia Arthritis on chronic steroids Hypokalemia Hypomagnesemia HTN Acute metabolic encephalopathy on chronic dementia Right lower extremity edema NO CODE BLUE / DNR     Initial presentation: 87yo with a history of AF on Eliquis, OA, HTN, dementia, and positive home COVID test 10/14/2020 status post Paxlovid treatment who presented to the ED 7/11 with worsening diarrhea very poor oral intake fatigue and severe generalized weakness. In the ED she was found to be in A. fib with RVR.  She was C. difficile negative.  She was hypokalemic at 2.5.  CT abdomen revealed distal colonic air-fluid levels consistent with diarrhea and diverticulosis with low-grade active inflammation.  Hospital Course:  Acute sigmoid diverticulitis No complicating factors noted on CT -  advanced diet to regular without difficulty - transitioned to oral antibiotics - medically stable for d/c and tolerating regular diet    Severe diarrhea Due to above - diarrhea has resolved   COVID-19 infection Negative COVID test in hospital - + home test on 10/14/2020 - remained on isolation for 10 days (7/4 > 7/14) - cleared to come off of isolation 10/25/20 AM - asymptomatic at time of d/c    Chronic atrial fibrillation with acute RVR RVR resolved /rate controlled - continue diltiazem and Eliquis   GERD with hiatal hernia Continue PPI   Arthritis on chronic steroids Continuing usual outpatient steroid dosing   Hypokalemia Due to GI losses - corrected w/ supplementation and stable   Hypomagnesemia Due to GI losses -corrected with supplementation   HTN Continue medical tx - no indication for strict control given advanced age    Acute metabolic encephalopathy on chronic dementia Appears to have returned to her baseline mental status   Right lower extremity edema Acute on chronic - stable   Dispo Medically cleared for d/c to SNF - bed available/[facility]      Allergies as of 10/28/2020       Reactions   Arimidex [anastrozole] Other (See Comments)   Neck cramping   Fish Allergy Other (See Comments)   Patient prefers to NOT eat this   Morphine And Related Nausea And Vomiting   Shellfish Allergy Other (See Comments)   Patient prefers to NOT eat this   Vicodin [hydrocodone-acetaminophen] Nausea Only   DOES tolerate Vicodin/Norco, however  Medication List     STOP taking these medications    atorvastatin 20 MG tablet Commonly known as: LIPITOR   diltiazem 240 MG 24 hr capsule Commonly known as: TIAZAC   furosemide 40 MG tablet Commonly known as: LASIX   HYDROcodone-acetaminophen 5-325 MG tablet Commonly known as: NORCO/VICODIN   nitroGLYCERIN 0.4 MG SL tablet Commonly known as: NITROSTAT       TAKE these medications     acetaminophen 325 MG tablet Commonly known as: TYLENOL Take 2 tablets (650 mg total) by mouth every 6 (six) hours as needed for mild pain (or Fever >/= 101). What changed: reasons to take this   calcium carbonate 1500 (600 Ca) MG Tabs tablet Commonly known as: OSCAL Take 600 mg of elemental calcium by mouth daily.   CENTRUM SILVER 50+WOMEN PO Take 1 tablet by mouth daily with breakfast.   diltiazem 240 MG 24 hr capsule Commonly known as: CARDIZEM CD Take 1 capsule (240 mg total) by mouth daily. Start taking on: October 29, 2020   Eliquis 5 MG Tabs tablet Generic drug: apixaban TAKE 1 TABLET BY MOUTH TWICE A DAY What changed: how much to take   loperamide 2 MG capsule Commonly known as: IMODIUM Take 1 capsule (2 mg total) by mouth as needed for diarrhea or loose stools.   losartan 50 MG tablet Commonly known as: COZAAR Take 1 tablet (50 mg total) by mouth daily. Start taking on: October 29, 2020 What changed:  medication strength additional instructions   ondansetron 4 MG tablet Commonly known as: Zofran Take 1 tablet (4 mg total) by mouth every 8 (eight) hours as needed for nausea or vomiting.   pantoprazole 40 MG tablet Commonly known as: PROTONIX Take 1 tablet (40 mg total) by mouth daily.   predniSONE 2.5 MG tablet Commonly known as: DELTASONE Take 1.25 mg by mouth daily.   sertraline 50 MG tablet Commonly known as: ZOLOFT Take 50 mg by mouth every morning.   vitamin C 500 MG tablet Commonly known as: ASCORBIC ACID Take 500 mg by mouth daily.   VITAMIN E PO Take 1,000 Units by mouth daily.        Day of Discharge BP (!) 140/58 (BP Location: Left Arm)   Pulse 78   Temp 98.7 F (37.1 C) (Oral)   Resp 19   Ht 5' (1.524 m)   Wt 73.3 kg   SpO2 97%   BMI 31.58 kg/m   Physical Exam: General: No acute respiratory distress Lungs: Clear to auscultation bilaterally without wheezes or crackles Cardiovascular: Regular rate and rhythm without murmur  gallop or rub normal S1 and S2 Abdomen: Nontender, nondistended, soft, bowel sounds positive, no rebound, no ascites, no appreciable mass Extremities: No significant cyanosis, clubbing, or edema bilateral lower extremities  Basic Metabolic Panel: Recent Labs  Lab 10/21/20 1343 10/22/20 0756 10/23/20 0412 10/24/20 0341 10/25/20 0343  NA 137 141 141 140 138  K 2.5* 3.0* 3.4* 3.8 3.9  CL 100 108 109 111 105  CO2 23 26 24 23 24   GLUCOSE 149* 165* 119* 139* 137*  BUN 10 6* 5* <5* <5*  CREATININE 0.64 0.51 0.61 0.64 0.70  CALCIUM 9.9 8.5* 8.8* 8.4* 8.6*  MG 1.9 1.5* 2.1  --  1.7    Liver Function Tests: Recent Labs  Lab 10/21/20 1343  AST 23  ALT 21  ALKPHOS 75  BILITOT 0.7  PROT 7.6  ALBUMIN 3.7   Recent Labs  Lab 10/21/20 1343  LIPASE  29     CBC: Recent Labs  Lab 10/21/20 1343 10/22/20 0352 10/24/20 0341 10/25/20 0343  WBC 11.7* 10.3 11.8* 10.4  HGB 14.1 11.9* 11.4* 12.0  HCT 40.5 35.0* 34.8* 37.1  MCV 89.8 92.6 94.8 94.4  PLT 352 272 275 307     Recent Results (from the past 240 hour(s))  Gastrointestinal Panel by PCR , Stool     Status: None   Collection Time: 10/21/20  4:09 PM   Specimen: Stool  Result Value Ref Range Status   Campylobacter species NOT DETECTED NOT DETECTED Final   Plesimonas shigelloides NOT DETECTED NOT DETECTED Final   Salmonella species NOT DETECTED NOT DETECTED Final   Yersinia enterocolitica NOT DETECTED NOT DETECTED Final   Vibrio species NOT DETECTED NOT DETECTED Final   Vibrio cholerae NOT DETECTED NOT DETECTED Final   Enteroaggregative E coli (EAEC) NOT DETECTED NOT DETECTED Final   Enteropathogenic E coli (EPEC) NOT DETECTED NOT DETECTED Final   Enterotoxigenic E coli (ETEC) NOT DETECTED NOT DETECTED Final   Shiga like toxin producing E coli (STEC) NOT DETECTED NOT DETECTED Final   Shigella/Enteroinvasive E coli (EIEC) NOT DETECTED NOT DETECTED Final   Cryptosporidium NOT DETECTED NOT DETECTED Final   Cyclospora  cayetanensis NOT DETECTED NOT DETECTED Final   Entamoeba histolytica NOT DETECTED NOT DETECTED Final   Giardia lamblia NOT DETECTED NOT DETECTED Final   Adenovirus F40/41 NOT DETECTED NOT DETECTED Final   Astrovirus NOT DETECTED NOT DETECTED Final   Norovirus GI/GII NOT DETECTED NOT DETECTED Final   Rotavirus A NOT DETECTED NOT DETECTED Final   Sapovirus (I, II, IV, and V) NOT DETECTED NOT DETECTED Final    Comment: Performed at [facility], [address] [place], [place] [address]  C Difficile Quick Screen w PCR reflex     Status: None   Collection Time: 10/21/20  4:09 PM   Specimen: Stool  Result Value Ref Range Status   C Diff antigen NEGATIVE NEGATIVE Final   C Diff toxin NEGATIVE NEGATIVE Final   C Diff interpretation No C. difficile detected.  Final    Comment: Performed at [facility], [place] [address], [place], [place] [address]  Resp Panel by RT-PCR (Flu A&B, Covid) Nasopharyngeal Swab     Status: None   Collection Time: 10/21/20  5:22 PM   Specimen: Nasopharyngeal Swab; Nasopharyngeal(NP) swabs in vial transport medium  Result Value Ref Range Status   SARS Coronavirus 2 by RT PCR NEGATIVE NEGATIVE Final    Comment: (NOTE) SARS-CoV-2 target nucleic acids are NOT DETECTED.  The SARS-CoV-2 RNA is generally detectable in upper respiratory specimens during the acute phase of infection. The lowest concentration of SARS-CoV-2 viral copies this assay can detect is 138 copies/mL. A negative result does not preclude SARS-Cov-2 infection and should not be used as the sole basis for treatment or other patient management decisions. A negative result may occur with  improper specimen collection/handling, submission of specimen other than nasopharyngeal swab, presence of viral mutation(s) within the areas targeted by this assay, and inadequate number of viral copies(<138 copies/mL). A negative result must be combined with clinical observations, patient  history, and epidemiological information. The expected result is Negative.  Fact Sheet for Patients:  EntrepreneurPulse.com.au  Fact Sheet for Healthcare Providers:  IncredibleEmployment.be  This test is no t yet approved or cleared by the [place] FDA and  has been authorized for detection and/or diagnosis of SARS-CoV-2 by FDA under an Emergency Use Authorization (  EUA). This EUA will remain  in effect (meaning this test can be used) for the duration of the COVID-19 declaration under Section 564(b)(1) of the Act, 21 U.S.C.section 360bbb-3(b)(1), unless the authorization is terminated  or revoked sooner.       Influenza A by PCR NEGATIVE NEGATIVE Final   Influenza B by PCR NEGATIVE NEGATIVE Final    Comment: (NOTE) The Xpert Xpress SARS-CoV-2/FLU/RSV plus assay is intended as an aid in the diagnosis of influenza from Nasopharyngeal swab specimens and should not be used as a sole basis for treatment. Nasal washings and aspirates are unacceptable for Xpert Xpress SARS-CoV-2/FLU/RSV testing.  Fact Sheet for Patients: EntrepreneurPulse.com.au  Fact Sheet for Healthcare Providers: IncredibleEmployment.be  This test is not yet approved or cleared by the [place] FDA and has been authorized for detection and/or diagnosis of SARS-CoV-2 by FDA under an Emergency Use Authorization (EUA). This EUA will remain in effect (meaning this test can be used) for the duration of the COVID-19 declaration under Section 564(b)(1) of the Act, 21 U.S.C. section 360bbb-3(b)(1), unless the authorization is terminated or revoked.  Performed at [facility], [place] [address], [place], [place] [address]       Time spent in discharge (includes decision making & examination of pt): 35 minutes  10/28/2020, 1:35 PM   Cherene Altes, MD [facility] Office  [phone]

## 2020-10-29 DIAGNOSIS — Z8616 Personal history of COVID-19: Secondary | ICD-10-CM | POA: Diagnosis not present

## 2020-10-29 DIAGNOSIS — I10 Essential (primary) hypertension: Secondary | ICD-10-CM | POA: Diagnosis not present

## 2020-10-29 DIAGNOSIS — I480 Paroxysmal atrial fibrillation: Secondary | ICD-10-CM | POA: Diagnosis not present

## 2020-10-29 DIAGNOSIS — K5792 Diverticulitis of intestine, part unspecified, without perforation or abscess without bleeding: Secondary | ICD-10-CM | POA: Diagnosis not present

## 2020-10-31 ENCOUNTER — Other Ambulatory Visit: Payer: Self-pay

## 2020-10-31 NOTE — Patient Outreach (Signed)
[facility]) Care Management  10/31/2020  [name] 05/03/1925 [id number]     Transition of Care Referral  Referral Date: 10/31/2020 Referral Source: [facility] Discharge Report Date of Discharge: 10/30/2020 Facility: [facility] attempt # 1 to patient. Spoke with daughter-[name] as she states patient has dementia. Daughter states that both her husband and patient(her mother) was at the same SNF for rehab. When she was notified that her spouse was been discharged she decided to take her mom out of facility as well. She voices it would just be easier on her to have them in the same place. 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 They all had COVID-19 but have successfully recovered and doing well. She reports that patient is improving and getting along well since returning home. She endorses that she has two paid caregivers in the home who assists her with carrying for them. Caregiver also states both of them getting [place] services via [facility]. She confirms she has all meds and plans to call MD office today to make f/u appt.[facility] services and TOC discussed. Daughter declined and does not wish to have ongoing calls states she has enough in home support and help and does not feel the phone calls would be beneficial. She was given RN CM contact info to call if anything changes.     Plan: RN CM will close referral at this time.   Enzo Montgomery, RN,BSN,CCM [facility] Telephonic Care Management Coordinator Direct Phone: [phone] Toll Free: [phone] Fax: [phone]

## 2020-11-02 DIAGNOSIS — F0390 Unspecified dementia without behavioral disturbance: Secondary | ICD-10-CM | POA: Diagnosis not present

## 2020-11-02 DIAGNOSIS — K5732 Diverticulitis of large intestine without perforation or abscess without bleeding: Secondary | ICD-10-CM | POA: Diagnosis not present

## 2020-11-02 DIAGNOSIS — K449 Diaphragmatic hernia without obstruction or gangrene: Secondary | ICD-10-CM | POA: Diagnosis not present

## 2020-11-02 DIAGNOSIS — K5730 Diverticulosis of large intestine without perforation or abscess without bleeding: Secondary | ICD-10-CM | POA: Diagnosis not present

## 2020-11-02 DIAGNOSIS — I482 Chronic atrial fibrillation, unspecified: Secondary | ICD-10-CM | POA: Diagnosis not present

## 2020-11-02 DIAGNOSIS — K5190 Ulcerative colitis, unspecified, without complications: Secondary | ICD-10-CM | POA: Diagnosis not present

## 2020-11-02 DIAGNOSIS — K219 Gastro-esophageal reflux disease without esophagitis: Secondary | ICD-10-CM | POA: Diagnosis not present

## 2020-11-02 DIAGNOSIS — K8000 Calculus of gallbladder with acute cholecystitis without obstruction: Secondary | ICD-10-CM | POA: Diagnosis not present

## 2020-11-02 DIAGNOSIS — U071 COVID-19: Secondary | ICD-10-CM | POA: Diagnosis not present

## 2020-11-02 DIAGNOSIS — R0602 Shortness of breath: Secondary | ICD-10-CM | POA: Diagnosis not present

## 2020-11-04 DIAGNOSIS — K5732 Diverticulitis of large intestine without perforation or abscess without bleeding: Secondary | ICD-10-CM | POA: Diagnosis not present

## 2020-11-04 DIAGNOSIS — K219 Gastro-esophageal reflux disease without esophagitis: Secondary | ICD-10-CM | POA: Diagnosis not present

## 2020-11-04 DIAGNOSIS — R0602 Shortness of breath: Secondary | ICD-10-CM | POA: Diagnosis not present

## 2020-11-04 DIAGNOSIS — K5190 Ulcerative colitis, unspecified, without complications: Secondary | ICD-10-CM | POA: Diagnosis not present

## 2020-11-04 DIAGNOSIS — U071 COVID-19: Secondary | ICD-10-CM | POA: Diagnosis not present

## 2020-11-04 DIAGNOSIS — K8000 Calculus of gallbladder with acute cholecystitis without obstruction: Secondary | ICD-10-CM | POA: Diagnosis not present

## 2020-11-04 DIAGNOSIS — F0390 Unspecified dementia without behavioral disturbance: Secondary | ICD-10-CM | POA: Diagnosis not present

## 2020-11-04 DIAGNOSIS — K5730 Diverticulosis of large intestine without perforation or abscess without bleeding: Secondary | ICD-10-CM | POA: Diagnosis not present

## 2020-11-04 DIAGNOSIS — I482 Chronic atrial fibrillation, unspecified: Secondary | ICD-10-CM | POA: Diagnosis not present

## 2020-11-06 ENCOUNTER — Emergency Department (HOSPITAL_COMMUNITY): Payer: Medicare HMO

## 2020-11-06 ENCOUNTER — Other Ambulatory Visit: Payer: Self-pay

## 2020-11-06 ENCOUNTER — Emergency Department (HOSPITAL_COMMUNITY)
Admission: EM | Admit: 2020-11-06 | Discharge: 2020-11-07 | Disposition: A | Discharge status: Home / Self Care | Payer: Medicare HMO | Attending: Emergency Medicine | Admitting: Emergency Medicine

## 2020-11-06 DIAGNOSIS — R000 Tachycardia, unspecified: Secondary | ICD-10-CM | POA: Diagnosis not present

## 2020-11-06 DIAGNOSIS — Z96653 Presence of artificial knee joint, bilateral: Secondary | ICD-10-CM | POA: Diagnosis not present

## 2020-11-06 DIAGNOSIS — R0602 Shortness of breath: Secondary | ICD-10-CM | POA: Insufficient documentation

## 2020-11-06 DIAGNOSIS — Z7901 Long term (current) use of anticoagulants: Secondary | ICD-10-CM | POA: Insufficient documentation

## 2020-11-06 DIAGNOSIS — R0902 Hypoxemia: Secondary | ICD-10-CM | POA: Diagnosis not present

## 2020-11-06 DIAGNOSIS — R4182 Altered mental status, unspecified: Secondary | ICD-10-CM | POA: Diagnosis not present

## 2020-11-06 DIAGNOSIS — I959 Hypotension, unspecified: Secondary | ICD-10-CM | POA: Diagnosis not present

## 2020-11-06 DIAGNOSIS — Z87891 Personal history of nicotine dependence: Secondary | ICD-10-CM | POA: Diagnosis not present

## 2020-11-06 DIAGNOSIS — I4891 Unspecified atrial fibrillation: Secondary | ICD-10-CM | POA: Insufficient documentation

## 2020-11-06 DIAGNOSIS — R404 Transient alteration of awareness: Secondary | ICD-10-CM | POA: Diagnosis not present

## 2020-11-06 DIAGNOSIS — R531 Weakness: Secondary | ICD-10-CM | POA: Diagnosis not present

## 2020-11-06 DIAGNOSIS — I10 Essential (primary) hypertension: Secondary | ICD-10-CM | POA: Diagnosis not present

## 2020-11-06 DIAGNOSIS — J9811 Atelectasis: Secondary | ICD-10-CM | POA: Diagnosis not present

## 2020-11-06 DIAGNOSIS — Z79899 Other long term (current) drug therapy: Secondary | ICD-10-CM | POA: Diagnosis not present

## 2020-11-06 DIAGNOSIS — Z853 Personal history of malignant neoplasm of breast: Secondary | ICD-10-CM | POA: Diagnosis not present

## 2020-11-06 DIAGNOSIS — K449 Diaphragmatic hernia without obstruction or gangrene: Secondary | ICD-10-CM | POA: Diagnosis not present

## 2020-11-06 DIAGNOSIS — R410 Disorientation, unspecified: Secondary | ICD-10-CM

## 2020-11-06 LAB — CBC WITH DIFFERENTIAL/PLATELET
Abs Immature Granulocytes: 0.03 10*3/uL (ref 0.00–0.07)
Basophils Absolute: 0 10*3/uL (ref 0.0–0.1)
Basophils Relative: 0 %
Eosinophils Absolute: 0.1 10*3/uL (ref 0.0–0.5)
Eosinophils Relative: 1 %
HCT: 37.7 % (ref 36.0–46.0)
Hemoglobin: 12.6 g/dL (ref 12.0–15.0)
Immature Granulocytes: 0 %
Lymphocytes Relative: 13 %
Lymphs Abs: 1.5 10*3/uL (ref 0.7–4.0)
MCH: 31.6 pg (ref 26.0–34.0)
MCHC: 33.4 g/dL (ref 30.0–36.0)
MCV: 94.5 fL (ref 80.0–100.0)
Monocytes Absolute: 1.1 10*3/uL — ABNORMAL HIGH (ref 0.1–1.0)
Monocytes Relative: 10 %
Neutro Abs: 8.3 10*3/uL — ABNORMAL HIGH (ref 1.7–7.7)
Neutrophils Relative %: 76 %
Platelets: 276 10*3/uL (ref 150–400)
RBC: 3.99 MIL/uL (ref 3.87–5.11)
RDW: 12.9 % (ref 11.5–15.5)
WBC: 10.9 10*3/uL — ABNORMAL HIGH (ref 4.0–10.5)
nRBC: 0 % (ref 0.0–0.2)

## 2020-11-06 LAB — BLOOD GAS, VENOUS
Acid-base deficit: 0.8 mmol/L (ref 0.0–2.0)
Bicarbonate: 23.1 mmol/L (ref 20.0–28.0)
O2 Saturation: 92.5 %
Patient temperature: 37
pCO2, Ven: 37.3 mmHg — ABNORMAL LOW (ref 44.0–60.0)
pH, Ven: 7.408 (ref 7.250–7.430)
pO2, Ven: 69.3 mmHg — ABNORMAL HIGH (ref 32.0–45.0)

## 2020-11-06 LAB — LACTIC ACID, PLASMA: Lactic Acid, Venous: 0.8 mmol/L (ref 0.5–1.9)

## 2020-11-06 NOTE — ED Triage Notes (Signed)
Pt BIB EMS from home c/o AMS. Per EMS pt o2 sat was 80% upon arrival. 4L o2 given by EMS. No reports of fever. Per report decrease P.O. intake. 500 ml NS fluid given by EMS.  BP 130/70 HR 116 RR 20 O2 98% on 4L. CBF 231

## 2020-11-06 NOTE — ED Notes (Signed)
EKG Stored, awaiting order for processing.

## 2020-11-06 NOTE — ED Provider Notes (Signed)
[facility] DEPT Provider Note   CSN: [id number] Arrival date & time: 11/06/20  2219     History Chief Complaint  Patient presents with   Altered Mental Status    [name] is a 87 y.o. female.  Patient presents to the emergency department by ambulance for evaluation of altered mental status.  EMS report that they found the patient with hypoxia, oxygen saturation was 80% upon their arrival.  Patient was placed on supplemental oxygen and oxygen saturations have improved.  Patient appears confused at arrival, has no complaints.  Level 5 caveat due to confusion.      Past Medical History:  Diagnosis Date   Arthritis    low spine   Breast cancer ([place]) 2013   right breast   Breast cancer, right breast, recurrent 06/03/2011   Cancer ([place]) 1989   breast right   Chronic back pain    Hypertension     Patient Active Problem List   Diagnosis Date Noted   DNR (do not resuscitate) 10/21/2020   Abdominal discomfort, epigastric    Goals of care, counseling/discussion    Palliative care by specialist    Abdominal pain 11/02/2018   Nausea and vomiting 11/02/2018   Dyslipidemia 11/02/2018   Chronic back pain 11/02/2018   Acute cholecystitis 11/02/2018   Cholelithiasis 11/01/2018   Atrial fibrillation, chronic ([place]) 10/18/2018   Gallstones 10/18/2018   Atrial fibrillation, new onset ([place]) 09/13/2016   Chronic anticoagulation 09/13/2016   Essential hypertension 09/13/2016   Chest pain with moderate risk of acute coronary syndrome 09/12/2016   Breast cancer, right breast, recurrent 06/03/2011   Personal history of malignant neoplasm of breast 04/14/1987    Past Surgical History:  Procedure Laterality Date   [place]   rt lumpectomy-axillary dissection   COLONOSCOPY     EYE SURGERY     both cataracts   JOINT REPLACEMENT  2004, 2005   both knees    MASTECTOMY Right 06/11/11   right breast    SHOULDER SURGERY     patient does not remember date of procedure     OB History   No obstetric history on file.     Family History  Problem Relation Age of Onset   Heart disease Mother    Breast cancer Maternal Aunt    Cancer Brother        unaware   Heart disease Brother     Social History   Tobacco Use   Smoking status: Former    Types: Cigarettes    Quit date: 05/11/1970    Years since quitting: 50.5   Smokeless tobacco: Never  Vaping Use   Vaping Use: Never used  Substance Use Topics   Alcohol use: No   Drug use: No    Home Medications Prior to Admission medications   Medication Sig Start Date End Date Taking? Authorizing Provider  acetaminophen (TYLENOL) 325 MG tablet Take 2 tablets (650 mg total) by mouth every 6 (six) hours as needed for mild pain (or Fever >/= 101). 10/28/20   Cherene Altes, MD  calcium carbonate (OSCAL) 1500 (600 Ca) MG TABS tablet Take 600 mg of elemental calcium by mouth daily.    [provider]  diltiazem (CARDIZEM CD) 240 MG 24 hr capsule Take 1 capsule (240 mg total) by mouth daily. 10/29/20   Cherene Altes, MD  ELIQUIS 5 MG TABS tablet TAKE 1 TABLET BY MOUTH TWICE  A DAY 07/14/18   Croitoru, Mihai, MD  loperamide (IMODIUM) 2 MG capsule Take 1 capsule (2 mg total) by mouth as needed for diarrhea or loose stools. 10/28/20   Cherene Altes, MD  losartan (COZAAR) 50 MG tablet Take 1 tablet (50 mg total) by mouth daily. 10/29/20   Cherene Altes, MD  Multiple Vitamins-Minerals (CENTRUM SILVER 50+WOMEN PO) Take 1 tablet by mouth daily with breakfast.    [provider]  ondansetron (ZOFRAN) 4 MG tablet Take 1 tablet (4 mg total) by mouth every 8 (eight) hours as needed for nausea or vomiting. 10/29/18   Benay Pike, MD  pantoprazole (PROTONIX) 40 MG tablet Take 1 tablet (40 mg total) by mouth daily. 11/11/18   Samuella Cota, MD  predniSONE (DELTASONE) 2.5 MG tablet Take 1.25 mg by mouth daily.  12/27/17    [provider]  sertraline (ZOLOFT) 50 MG tablet Take 50 mg by mouth every morning.  02/04/18   [provider]  vitamin C (ASCORBIC ACID) 500 MG tablet Take 500 mg by mouth daily.    [provider]  VITAMIN E PO Take 1,000 Units by mouth daily.     [provider]    Allergies    Arimidex [anastrozole], Fish allergy, Morphine and related, Shellfish allergy, and Vicodin [hydrocodone-acetaminophen]  Review of Systems   Review of Systems  Unable to perform ROS: Mental status change   Physical Exam Updated Vital Signs BP (!) 146/66   Pulse 79   Temp 98.1 F (36.7 C) (Oral)   Resp 17   SpO2 95%   Physical Exam Vitals and nursing note reviewed.  Constitutional:      General: She is not in acute distress.    Appearance: Normal appearance. She is well-developed.  HENT:     Head: Normocephalic and atraumatic.     Right Ear: Hearing normal.     Left Ear: Hearing normal.     Nose: Nose normal.  Eyes:     Conjunctiva/sclera: Conjunctivae normal.     Pupils: Pupils are equal, round, and reactive to light.  Cardiovascular:     Rate and Rhythm: Regular rhythm.     Heart sounds: S1 normal and S2 normal. No murmur heard.   No friction rub. No gallop.  Pulmonary:     Effort: Pulmonary effort is normal. No respiratory distress.     Breath sounds: Normal breath sounds.  Chest:     Chest wall: No tenderness.  Abdominal:     General: Bowel sounds are normal.     Palpations: Abdomen is soft.     Tenderness: There is no abdominal tenderness. There is no guarding or rebound. Negative signs include Murphy's sign and McBurney's sign.     Hernia: No hernia is present.  Musculoskeletal:        General: Normal range of motion.     Cervical back: Normal range of motion and neck supple.  Skin:    General: Skin is warm and dry.     Findings: No rash.  Neurological:     Mental Status: She is alert. She is confused.     Cranial Nerves: No cranial nerve  deficit.     Sensory: No sensory deficit.     Coordination: Coordination normal.  Psychiatric:        Speech: Speech normal.        Behavior: Behavior normal.    ED Results / Procedures / Treatments   Labs (all labs  ordered are listed, but only abnormal results are displayed) Labs Reviewed  CBC WITH DIFFERENTIAL/PLATELET - Abnormal; Notable for the following components:      Result Value   WBC 10.9 (*)    Neutro Abs 8.3 (*)    Monocytes Absolute 1.1 (*)    All other components within normal limits  COMPREHENSIVE METABOLIC PANEL - Abnormal; Notable for the following components:   Potassium 3.4 (*)    Glucose, Bld 172 (*)    Calcium 8.0 (*)    Total Protein 5.8 (*)    Albumin 2.9 (*)    All other components within normal limits  BRAIN NATRIURETIC PEPTIDE - Abnormal; Notable for the following components:   B Natriuretic Peptide 303.5 (*)    All other components within normal limits  BLOOD GAS, VENOUS - Abnormal; Notable for the following components:   pCO2, Ven 37.3 (*)    pO2, Ven 69.3 (*)    All other components within normal limits  LACTIC ACID, PLASMA  URINALYSIS, ROUTINE W REFLEX MICROSCOPIC  TROPONIN I (HIGH SENSITIVITY)  TROPONIN I (HIGH SENSITIVITY)    EKG EKG Interpretation  Date/Time:  Wednesday November 06 2020 22:44:22 EDT Ventricular Rate:  99 PR Interval:    QRS Duration: 80 QT Interval:  361 QTC Calculation: 427 R Axis:   -40 Text Interpretation: Atrial flutter Left ventricular hypertrophy Anterior infarct, old Confirmed by Orpah Greek [phone]) on 11/07/2020 1:50:46 AM  Radiology CT HEAD WO CONTRAST  Result Date: 11/07/2020 CLINICAL DATA:  Altered mental status. EXAM: CT HEAD WITHOUT CONTRAST TECHNIQUE: Contiguous axial images were obtained from the base of the skull through the vertex without intravenous contrast. COMPARISON:  August 03, 2020 FINDINGS: Brain: There is mild cerebral atrophy with widening of the extra-axial spaces and ventricular  dilatation. There are areas of decreased attenuation within the white matter tracts of the supratentorial brain, consistent with microvascular disease changes. Vascular: No hyperdense vessel or unexpected calcification. Skull: Normal. Negative for fracture or focal lesion. Sinuses/Orbits: No acute finding. Other: None. IMPRESSION: 1. Generalized cerebral atrophy. 2. No acute intracranial abnormality. Electronically Signed   By: Virgina Norfolk M.D.   On: 11/07/2020 00:00   CT Angio Chest Pulmonary Embolism (PE) W or WO Contrast  Result Date: 11/07/2020 CLINICAL DATA:  Altered mental status, shortness of breath low oxygen saturations and elevated white blood cell count. EXAM: CT ANGIOGRAPHY CHEST WITH CONTRAST TECHNIQUE: Multidetector CT imaging of the chest was performed using the standard protocol during bolus administration of intravenous contrast. Multiplanar CT image reconstructions and MIPs were obtained to evaluate the vascular anatomy. CONTRAST:  33m OMNIPAQUE IOHEXOL 350 MG/ML SOLN COMPARISON:  Chest x-ray 11/06/2020 FINDINGS: Cardiovascular: The heart is normal in size for age. No pericardial effusion. There is tortuosity, ectasia and calcification of the thoracic aorta but no focal aneurysm or obvious dissection. The pulmonary arterial tree is well opacified. No filling defects to suggest pulmonary embolism. Mediastinum/Nodes: No mediastinal or hilar mass or lymphadenopathy. There is a large hiatal hernia. Lungs/Pleura: Mild emphysematous changes and pulmonary scarring. Dependent subpleural atelectasis noted bilaterally. There is also vascular crowding and basilar atelectasis due to moderate eventration of both hemidiaphragms and the left large hiatal hernia. No worrisome pulmonary lesions. No pleural effusions. Upper Abdomen: No significant upper abdominal findings. Aortic and branch vessel calcifications are noted. Musculoskeletal: Prior right mastectomy. No left breast mass. No supraclavicular or  axillary adenopathy. The bony thorax is intact. Review of the MIP images confirms the above findings. IMPRESSION: 1.  No CT findings for pulmonary embolism. 2. Tortuosity, ectasia and calcification of the thoracic aorta but no focal aneurysm or obvious dissection. 3. Mild emphysematous changes and pulmonary scarring. No acute overlying pulmonary process. 4. Large hiatal hernia. 5. Emphysema and aortic atherosclerosis. Aortic Atherosclerosis (ICD10-I70.0) and Emphysema (ICD10-J43.9). Electronically Signed   By: Marijo Sanes M.D.   On: 11/07/2020 05:50   DG Chest Port 1 View  Result Date: 11/06/2020 CLINICAL DATA:  87 year old female with shortness of breath EXAM: PORTABLE CHEST 1 VIEW COMPARISON:  Chest radiograph dated 10/21/2020. FINDINGS: Shallow inspiration. Left lung base density may represent atelectasis or infiltrate. No large pleural effusion. No pneumothorax. Stable cardiac silhouette. Small hiatal hernia. Atherosclerotic calcification of the aorta. Osteopenia with degenerative changes of the spine and shoulders. No acute osseous pathology. IMPRESSION: Left lung base atelectasis versus infiltrate. Electronically Signed   By: Anner Crete M.D.   On: 11/06/2020 23:34    Procedures Procedures   Medications Ordered in ED Medications  HYDROcodone-acetaminophen (NORCO/VICODIN) 5-325 MG per tablet 1 tablet (has no administration in time range)  iohexol (OMNIPAQUE) 350 MG/ML injection 100 mL (80 mLs Intravenous Contrast Given 11/07/20 0528)    ED Course  I have reviewed the triage vital signs and the nursing notes.  Pertinent labs & imaging results that were available during my care of the patient were reviewed by me and considered in my medical decision making (see chart for details).    MDM Rules/Calculators/A&P                          Patient presents to the emergency department by ambulance from home.  Patient was recently hospitalized for COVID.  She was discharged to a rehab but  family took her out after a few days.  She is currently at home.  Family reports that she was having trouble ambulating because of generalized weakness and was more confused than usual.  EMS documented low oxygen saturations.  At arrival to the emergency department she is on supplemental oxygen by nasal cannula with normal oxygen saturations.  Vital signs have been normal.  She was weaned off oxygen to room air and remains 94 to 96%.  Chest x-ray with atelectasis versus infiltrate but CT angio of chest does not show PE or pneumonia.  Urinalysis unremarkable.  Work-up is entirely unremarkable.  Towards the end of the visit, patient began to complain of left ear pain.  Tympanic membrane was visualized, is normal.  Canal with some wax but otherwise unremarkable.  Intraoral exam reveals that she is mostly edentulous, no swelling.  She is able to open and close her jaw but reports that this increases the pain.  Etiology of pain unclear, does not require further work-up.  Recommended keeping the patient in the emergency department for social work to try and place her in a different rehab.  Daughter reports that she has had bad experiences and would rather take her home.  Final Clinical Impression(s) / ED Diagnoses Final diagnoses:  Confusion    Rx / DC Orders ED Discharge Orders     None        Seanna Sisler, Gwenyth Allegra, MD 11/07/20 [phone]

## 2020-11-07 ENCOUNTER — Emergency Department (HOSPITAL_COMMUNITY): Payer: Medicare HMO

## 2020-11-07 ENCOUNTER — Encounter (HOSPITAL_COMMUNITY): Payer: Self-pay

## 2020-11-07 DIAGNOSIS — U071 COVID-19: Secondary | ICD-10-CM | POA: Diagnosis not present

## 2020-11-07 DIAGNOSIS — K5190 Ulcerative colitis, unspecified, without complications: Secondary | ICD-10-CM | POA: Diagnosis not present

## 2020-11-07 DIAGNOSIS — K5730 Diverticulosis of large intestine without perforation or abscess without bleeding: Secondary | ICD-10-CM | POA: Diagnosis not present

## 2020-11-07 DIAGNOSIS — J9811 Atelectasis: Secondary | ICD-10-CM | POA: Diagnosis not present

## 2020-11-07 DIAGNOSIS — F0390 Unspecified dementia without behavioral disturbance: Secondary | ICD-10-CM | POA: Diagnosis not present

## 2020-11-07 DIAGNOSIS — K219 Gastro-esophageal reflux disease without esophagitis: Secondary | ICD-10-CM | POA: Diagnosis not present

## 2020-11-07 DIAGNOSIS — K8000 Calculus of gallbladder with acute cholecystitis without obstruction: Secondary | ICD-10-CM | POA: Diagnosis not present

## 2020-11-07 DIAGNOSIS — I482 Chronic atrial fibrillation, unspecified: Secondary | ICD-10-CM | POA: Diagnosis not present

## 2020-11-07 DIAGNOSIS — R0602 Shortness of breath: Secondary | ICD-10-CM | POA: Diagnosis not present

## 2020-11-07 DIAGNOSIS — K449 Diaphragmatic hernia without obstruction or gangrene: Secondary | ICD-10-CM | POA: Diagnosis not present

## 2020-11-07 DIAGNOSIS — K5732 Diverticulitis of large intestine without perforation or abscess without bleeding: Secondary | ICD-10-CM | POA: Diagnosis not present

## 2020-11-07 LAB — TROPONIN I (HIGH SENSITIVITY)
Troponin I (High Sensitivity): 9 ng/L (ref <18)
Troponin I (High Sensitivity): 7 ng/L (ref <18)

## 2020-11-07 LAB — URINALYSIS, ROUTINE W REFLEX MICROSCOPIC
Bilirubin Urine: NEGATIVE
Glucose, UA: NEGATIVE mg/dL
Hgb urine dipstick: NEGATIVE
Ketones, ur: NEGATIVE mg/dL
Leukocytes,Ua: NEGATIVE
Nitrite: NEGATIVE
Protein, ur: NEGATIVE mg/dL
Specific Gravity, Urine: 1.012 (ref 1.005–1.030)
pH: 6 (ref 5.0–8.0)

## 2020-11-07 LAB — COMPREHENSIVE METABOLIC PANEL
ALT: 12 U/L (ref 0–44)
AST: 24 U/L (ref 15–41)
Albumin: 2.9 g/dL — ABNORMAL LOW (ref 3.5–5.0)
Alkaline Phosphatase: 55 U/L (ref 38–126)
Anion gap: 11 (ref 5–15)
BUN: 13 mg/dL (ref 8–23)
CO2: 23 mmol/L (ref 22–32)
Calcium: 8 mg/dL — ABNORMAL LOW (ref 8.9–10.3)
Chloride: 104 mmol/L (ref 98–111)
Creatinine, Ser: 0.73 mg/dL (ref 0.44–1.00)
GFR, Estimated: >60 mL/min (ref >60)
Glucose, Bld: 172 mg/dL — ABNORMAL HIGH (ref 70–99)
Potassium: 3.4 mmol/L — ABNORMAL LOW (ref 3.5–5.1)
Sodium: 138 mmol/L (ref 135–145)
Total Bilirubin: 0.9 mg/dL (ref 0.3–1.2)
Total Protein: 5.8 g/dL — ABNORMAL LOW (ref 6.5–8.1)

## 2020-11-07 LAB — BRAIN NATRIURETIC PEPTIDE: B Natriuretic Peptide: 303.5 pg/mL — ABNORMAL HIGH (ref 0.0–100.0)

## 2020-11-07 MED ORDER — HYDROCODONE-ACETAMINOPHEN 5-325 MG PO TABS
1.0000 | ORAL_TABLET | Freq: Once | ORAL | Status: AC
  Administered 2020-11-07: 1 via ORAL
  Filled 2020-11-07: qty 1

## 2020-11-07 MED ORDER — IOHEXOL 350 MG/ML SOLN
100.0000 mL | Freq: Once | INTRAVENOUS | Status: AC | PRN
  Administered 2020-11-07: 80 mL via INTRAVENOUS

## 2020-11-07 NOTE — ED Notes (Signed)
Pt has no urinary output at this time via purewick, will monitor.

## 2020-11-08 ENCOUNTER — Emergency Department (HOSPITAL_COMMUNITY): Payer: Medicare HMO

## 2020-11-08 ENCOUNTER — Other Ambulatory Visit: Payer: Self-pay

## 2020-11-08 ENCOUNTER — Emergency Department (HOSPITAL_COMMUNITY)
Admission: EM | Admit: 2020-11-08 | Discharge: 2020-11-08 | Disposition: A | Discharge status: Home / Self Care | Payer: Medicare HMO | Attending: Emergency Medicine | Admitting: Emergency Medicine

## 2020-11-08 ENCOUNTER — Encounter (HOSPITAL_COMMUNITY): Payer: Self-pay

## 2020-11-08 DIAGNOSIS — Z87891 Personal history of nicotine dependence: Secondary | ICD-10-CM | POA: Diagnosis not present

## 2020-11-08 DIAGNOSIS — M47816 Spondylosis without myelopathy or radiculopathy, lumbar region: Secondary | ICD-10-CM | POA: Diagnosis not present

## 2020-11-08 DIAGNOSIS — Z043 Encounter for examination and observation following other accident: Secondary | ICD-10-CM | POA: Diagnosis not present

## 2020-11-08 DIAGNOSIS — J984 Other disorders of lung: Secondary | ICD-10-CM | POA: Insufficient documentation

## 2020-11-08 DIAGNOSIS — F0390 Unspecified dementia without behavioral disturbance: Secondary | ICD-10-CM | POA: Insufficient documentation

## 2020-11-08 DIAGNOSIS — W19XXXA Unspecified fall, initial encounter: Secondary | ICD-10-CM | POA: Insufficient documentation

## 2020-11-08 DIAGNOSIS — M47812 Spondylosis without myelopathy or radiculopathy, cervical region: Secondary | ICD-10-CM | POA: Diagnosis not present

## 2020-11-08 DIAGNOSIS — H748X2 Other specified disorders of left middle ear and mastoid: Secondary | ICD-10-CM | POA: Diagnosis not present

## 2020-11-08 DIAGNOSIS — I10 Essential (primary) hypertension: Secondary | ICD-10-CM | POA: Diagnosis not present

## 2020-11-08 DIAGNOSIS — Z20822 Contact with and (suspected) exposure to covid-19: Secondary | ICD-10-CM | POA: Diagnosis not present

## 2020-11-08 DIAGNOSIS — Z79899 Other long term (current) drug therapy: Secondary | ICD-10-CM | POA: Diagnosis not present

## 2020-11-08 DIAGNOSIS — R296 Repeated falls: Secondary | ICD-10-CM | POA: Diagnosis not present

## 2020-11-08 DIAGNOSIS — Z853 Personal history of malignant neoplasm of breast: Secondary | ICD-10-CM | POA: Insufficient documentation

## 2020-11-08 DIAGNOSIS — Z96653 Presence of artificial knee joint, bilateral: Secondary | ICD-10-CM | POA: Diagnosis not present

## 2020-11-08 DIAGNOSIS — Z7901 Long term (current) use of anticoagulants: Secondary | ICD-10-CM | POA: Insufficient documentation

## 2020-11-08 DIAGNOSIS — Y900 Blood alcohol level of less than 20 mg/100 ml: Secondary | ICD-10-CM | POA: Diagnosis not present

## 2020-11-08 DIAGNOSIS — R0602 Shortness of breath: Secondary | ICD-10-CM | POA: Diagnosis not present

## 2020-11-08 DIAGNOSIS — M545 Low back pain, unspecified: Secondary | ICD-10-CM | POA: Diagnosis not present

## 2020-11-08 DIAGNOSIS — M549 Dorsalgia, unspecified: Secondary | ICD-10-CM | POA: Diagnosis not present

## 2020-11-08 DIAGNOSIS — R918 Other nonspecific abnormal finding of lung field: Secondary | ICD-10-CM | POA: Diagnosis not present

## 2020-11-08 DIAGNOSIS — I700 Atherosclerosis of aorta: Secondary | ICD-10-CM | POA: Diagnosis not present

## 2020-11-08 LAB — DIFFERENTIAL
Abs Immature Granulocytes: 0.05 10*3/uL (ref 0.00–0.07)
Basophils Absolute: 0 10*3/uL (ref 0.0–0.1)
Basophils Relative: 0 %
Eosinophils Absolute: 0.1 10*3/uL (ref 0.0–0.5)
Eosinophils Relative: 1 %
Immature Granulocytes: 0 %
Lymphocytes Relative: 13 %
Lymphs Abs: 1.5 10*3/uL (ref 0.7–4.0)
Monocytes Absolute: 1 10*3/uL (ref 0.1–1.0)
Monocytes Relative: 8 %
Neutro Abs: 9 10*3/uL — ABNORMAL HIGH (ref 1.7–7.7)
Neutrophils Relative %: 78 %

## 2020-11-08 LAB — COMPREHENSIVE METABOLIC PANEL
ALT: 18 U/L (ref 0–44)
AST: 21 U/L (ref 15–41)
Albumin: 3.5 g/dL (ref 3.5–5.0)
Alkaline Phosphatase: 63 U/L (ref 38–126)
Anion gap: 11 (ref 5–15)
BUN: 9 mg/dL (ref 8–23)
CO2: 26 mmol/L (ref 22–32)
Calcium: 9.4 mg/dL (ref 8.9–10.3)
Chloride: 101 mmol/L (ref 98–111)
Creatinine, Ser: 0.66 mg/dL (ref 0.44–1.00)
GFR, Estimated: >60 mL/min (ref >60)
Glucose, Bld: 127 mg/dL — ABNORMAL HIGH (ref 70–99)
Potassium: 3.3 mmol/L — ABNORMAL LOW (ref 3.5–5.1)
Sodium: 138 mmol/L (ref 135–145)
Total Bilirubin: 0.8 mg/dL (ref 0.3–1.2)
Total Protein: 7 g/dL (ref 6.5–8.1)

## 2020-11-08 LAB — URINALYSIS, ROUTINE W REFLEX MICROSCOPIC
Bilirubin Urine: NEGATIVE
Glucose, UA: NEGATIVE mg/dL
Hgb urine dipstick: NEGATIVE
Ketones, ur: NEGATIVE mg/dL
Nitrite: NEGATIVE
Protein, ur: 30 mg/dL — AB
Specific Gravity, Urine: 1.017 (ref 1.005–1.030)
pH: 9 — ABNORMAL HIGH (ref 5.0–8.0)

## 2020-11-08 LAB — RESP PANEL BY RT-PCR (FLU A&B, COVID) ARPGX2
Influenza A by PCR: NEGATIVE
Influenza B by PCR: NEGATIVE
SARS Coronavirus 2 by RT PCR: NEGATIVE

## 2020-11-08 LAB — RAPID URINE DRUG SCREEN, HOSP PERFORMED
Amphetamines: NOT DETECTED
Barbiturates: NOT DETECTED
Benzodiazepines: NOT DETECTED
Cocaine: NOT DETECTED
Opiates: POSITIVE — AB
Tetrahydrocannabinol: NOT DETECTED

## 2020-11-08 LAB — CBC
HCT: 42.3 % (ref 36.0–46.0)
Hemoglobin: 13.6 g/dL (ref 12.0–15.0)
MCH: 30.7 pg (ref 26.0–34.0)
MCHC: 32.2 g/dL (ref 30.0–36.0)
MCV: 95.5 fL (ref 80.0–100.0)
Platelets: 288 10*3/uL (ref 150–400)
RBC: 4.43 MIL/uL (ref 3.87–5.11)
RDW: 12.8 % (ref 11.5–15.5)
WBC: 11.7 10*3/uL — ABNORMAL HIGH (ref 4.0–10.5)
nRBC: 0 % (ref 0.0–0.2)

## 2020-11-08 LAB — ETHANOL: Alcohol, Ethyl (B): <10 mg/dL (ref <10)

## 2020-11-08 LAB — APTT: aPTT: 27 seconds (ref 24–36)

## 2020-11-08 LAB — PROTIME-INR
INR: 1.4 — ABNORMAL HIGH (ref 0.8–1.2)
Prothrombin Time: 16.9 seconds — ABNORMAL HIGH (ref 11.4–15.2)

## 2020-11-08 MED ORDER — AMOXICILLIN-POT CLAVULANATE 875-125 MG PO TABS: 1.0000 | ORAL_TABLET | Freq: Two times a day (BID) | ORAL | 0 refills | Status: AC

## 2020-11-08 MED ORDER — IOHEXOL 300 MG/ML  SOLN
50.0000 mL | Freq: Once | INTRAMUSCULAR | Status: AC | PRN
  Administered 2020-11-08: 50 mL via INTRAVENOUS

## 2020-11-08 NOTE — ED Notes (Signed)
Dr Tomi Bamberger is trying to get in touch with family to see what tests we need to get.

## 2020-11-08 NOTE — Discharge Instructions (Addendum)
The antibiotics as prescribed.  Follow-up with your doctor to be rechecked.  Return as needed for worsening symptoms

## 2020-11-08 NOTE — ED Notes (Signed)
Patient transported to CT 

## 2020-11-08 NOTE — ED Provider Notes (Signed)
[facility] EMERGENCY DEPARTMENT Provider Note   CSN: [id number] Arrival date & time: 11/08/20  1007     History Chief Complaint  Patient presents with   [name]    [name] is a 87 y.o. female.   Fall   Patient presented to ED for evaluation of a possible fall.  Patient was found on the floor this morning at 9 AM.  Apparently patient resides at home with family.  Unclear if the patient had a syncopal episode where she fell.  In the emergency room the patient does not speak to me or answer any my questions.    Past Medical History:  Diagnosis Date   Arthritis    low spine   Breast cancer ([place]) 2013   right breast   Breast cancer, right breast, recurrent 06/03/2011   Cancer ([place]) 1989   breast right   Chronic back pain    Hypertension     Patient Active Problem List   Diagnosis Date Noted   DNR (do not resuscitate) 10/21/2020   Abdominal discomfort, epigastric    Goals of care, counseling/discussion    Palliative care by specialist    Abdominal pain 11/02/2018   Nausea and vomiting 11/02/2018   Dyslipidemia 11/02/2018   Chronic back pain 11/02/2018   Acute cholecystitis 11/02/2018   Cholelithiasis 11/01/2018   Atrial fibrillation, chronic ([place]) 10/18/2018   Gallstones 10/18/2018   Atrial fibrillation, new onset ([place]) 09/13/2016   Chronic anticoagulation 09/13/2016   Essential hypertension 09/13/2016   Chest pain with moderate risk of acute coronary syndrome 09/12/2016   Breast cancer, right breast, recurrent 06/03/2011   Personal history of malignant neoplasm of breast 04/14/1987    Past Surgical History:  Procedure Laterality Date   [place]   rt lumpectomy-axillary dissection   COLONOSCOPY     EYE SURGERY     both cataracts   JOINT REPLACEMENT  2004, 2005   both knees    MASTECTOMY Right 06/11/11   right breast   SHOULDER SURGERY     patient does not remember date of procedure      OB History   No obstetric history on file.     Family History  Problem Relation Age of Onset   Heart disease Mother    Breast cancer Maternal Aunt    Cancer Brother        unaware   Heart disease Brother     Social History   Tobacco Use   Smoking status: Former    Types: Cigarettes    Quit date: 05/11/1970    Years since quitting: 50.5   Smokeless tobacco: Never  Vaping Use   Vaping Use: Never used  Substance Use Topics   Alcohol use: No   Drug use: No    Home Medications Prior to Admission medications   Medication Sig Start Date End Date Taking? Authorizing Provider  acetaminophen (TYLENOL) 325 MG tablet Take 2 tablets (650 mg total) by mouth every 6 (six) hours as needed for mild pain (or Fever >/= 101). 10/28/20  Yes Cherene Altes, MD  amoxicillin-clavulanate (AUGMENTIN) 875-125 MG tablet Take 1 tablet by mouth 2 (two) times daily. 11/08/20  Yes Dorie Rank, MD  calcium carbonate (OSCAL) 1500 (600 Ca) MG TABS tablet Take 600 mg of elemental calcium by mouth daily.   Yes [provider]  diltiazem (CARDIZEM CD) 240 MG 24 hr capsule Take 1 capsule (240 mg total)  by mouth daily. 10/29/20  Yes McClung, Kimberlee Nearing, MD  ELIQUIS 5 MG TABS tablet TAKE 1 TABLET BY MOUTH TWICE A DAY Patient taking differently: Take 5 mg by mouth 2 (two) times daily. 07/14/18  Yes Croitoru, Mihai, MD  HYDROcodone-acetaminophen (NORCO/VICODIN) 5-325 MG tablet Take 1 tablet by mouth 3 (three) times daily as needed for pain. 11/05/20  Yes [provider]  loperamide (IMODIUM) 2 MG capsule Take 1 capsule (2 mg total) by mouth as needed for diarrhea or loose stools. 10/28/20  Yes Cherene Altes, MD  losartan (COZAAR) 50 MG tablet Take 1 tablet (50 mg total) by mouth daily. 10/29/20  Yes Cherene Altes, MD  Multiple Vitamins-Minerals (CENTRUM SILVER 50+WOMEN PO) Take 1 tablet by mouth daily with breakfast.   Yes [provider]  ondansetron (ZOFRAN) 4 MG tablet Take 1  tablet (4 mg total) by mouth every 8 (eight) hours as needed for nausea or vomiting. 10/29/18  Yes Benay Pike, MD  pantoprazole (PROTONIX) 40 MG tablet Take 1 tablet (40 mg total) by mouth daily. 11/11/18  Yes Samuella Cota, MD  predniSONE (DELTASONE) 2.5 MG tablet Take 1.25 mg by mouth daily.  12/27/17  Yes [provider]  sertraline (ZOLOFT) 50 MG tablet Take 50 mg by mouth every morning.  02/04/18  Yes [provider]  vitamin C (ASCORBIC ACID) 500 MG tablet Take 500 mg by mouth daily.   Yes [provider]  VITAMIN E PO Take 1,000 Units by mouth daily.    Yes [provider]    Allergies    Arimidex [anastrozole], Fish allergy, Morphine and related, and Shellfish allergy  Review of Systems   Review of Systems  Unable to perform ROS: Mental status change   Physical Exam Updated Vital Signs BP (!) 147/95   Pulse 81   Temp 98.1 F (36.7 C) (Oral)   Resp 18   Ht 1.524 m (5')   Wt 72.6 kg   SpO2 93%   BMI 31.25 kg/m   Physical Exam Vitals and nursing note reviewed.  Constitutional:      Appearance: She is well-developed. She is ill-appearing.     Comments: Frail, elderly  HENT:     Head: Normocephalic and atraumatic.     Right Ear: External ear normal.     Left Ear: External ear normal.  Eyes:     General: No scleral icterus.       Right eye: No discharge.        Left eye: No discharge.     Conjunctiva/sclera: Conjunctivae normal.  Neck:     Trachea: No tracheal deviation.  Cardiovascular:     Rate and Rhythm: Normal rate and regular rhythm.  Pulmonary:     Effort: Pulmonary effort is normal. No respiratory distress.     Breath sounds: Normal breath sounds. No stridor. No wheezing or rales.  Abdominal:     General: Bowel sounds are normal. There is no distension.     Palpations: Abdomen is soft.     Tenderness: There is no abdominal tenderness. There is no guarding or rebound.  Musculoskeletal:        General: No  tenderness or deformity.     Cervical back: Neck supple.  Skin:    General: Skin is warm and dry.     Findings: No rash.  Neurological:     Mental Status: She is alert.     Cranial Nerves: No cranial nerve deficit (no facial  droop, extraocular movements intact, no slurred speech).     Sensory: No sensory deficit.     Motor: No abnormal muscle tone or seizure activity.     Coordination: Coordination normal.  Psychiatric:        Mood and Affect: Mood normal.    ED Results / Procedures / Treatments   Labs (all labs ordered are listed, but only abnormal results are displayed) Labs Reviewed  PROTIME-INR - Abnormal; Notable for the following components:      Result Value   Prothrombin Time 16.9 (*)    INR 1.4 (*)    All other components within normal limits  CBC - Abnormal; Notable for the following components:   WBC 11.7 (*)    All other components within normal limits  DIFFERENTIAL - Abnormal; Notable for the following components:   Neutro Abs 9.0 (*)    All other components within normal limits  COMPREHENSIVE METABOLIC PANEL - Abnormal; Notable for the following components:   Potassium 3.3 (*)    Glucose, Bld 127 (*)    All other components within normal limits  RAPID URINE DRUG SCREEN, HOSP PERFORMED - Abnormal; Notable for the following components:   Opiates POSITIVE (*)    All other components within normal limits  URINALYSIS, ROUTINE W REFLEX MICROSCOPIC - Abnormal; Notable for the following components:   APPearance CLOUDY (*)    pH 9.0 (*)    Protein, ur 30 (*)    Leukocytes,Ua LARGE (*)    Bacteria, UA FEW (*)    All other components within normal limits  RESP PANEL BY RT-PCR (FLU A&B, COVID) [id number]  ETHANOL  APTT    EKG EKG Interpretation  Date/Time:  Friday November 08 2020 10:51:08 EDT Ventricular Rate:  89 PR Interval:  202 QRS Duration: 86 QT Interval:  392 QTC Calculation: 476 R Axis:   -33 Text Interpretation: Sinus rhythm with Premature atrial  complexes Left axis deviation Moderate voltage criteria for LVH, may be normal variant ( R in aVL , Cornell product ) Anterior infarct , age undetermined Abnormal ECG Sinus rhythm appears to be of replaced atrial flutter Confirmed by Dorie Rank [phone]) on 11/08/2020 11:47:46 AM  Radiology DG Thoracic Spine 2 View  Result Date: 11/08/2020 CLINICAL DATA:  Fall. EXAM: THORACIC SPINE 2 VIEWS COMPARISON:  11/07/2020. FINDINGS: No new vertebral body height loss to suggest acute fracture. No significant sagittal subluxation. Mild reverse S-shaped thoracic curvature with levocurvature of the visualized lumbar spine. Multilevel degenerative change, greatest in the visualized upper lumbar spine. Fusion lower thoracic/upper vertebral bodies, chronic. Atherosclerosis of the aorta. See same day CT of the cervical spine for description of upper lobe opacities. IMPRESSION: 1. No radiographic evidence of acute fracture or traumatic malalignment. 2. See same day CT of the cervical spine for description of upper lobe opacities. Electronically Signed   By: Margaretha Sheffield MD   On: 11/08/2020 13:34   DG Lumbar Spine 2-3 Views  Result Date: 11/08/2020 CLINICAL DATA:  Fall.  Found on floor EXAM: LUMBAR SPINE - 2-3 VIEW COMPARISON:  None. FINDINGS: Moderate lumbar scoliosis. Multilevel disc degeneration in the lumbar spine. Negative for fracture. Atherosclerotic aorta. IMPRESSION: Negative for lumbar fracture. Electronically Signed   By: Franchot Gallo M.D.   On: 11/08/2020 13:30   DG Pelvis 1-2 Views  Result Date: 11/08/2020 CLINICAL DATA:  Fall.  Found on floor EXAM: PELVIS - 1-2 VIEW COMPARISON:  10/17/2018 FINDINGS: Negative for pelvic fracture.  Both hips are negative. Lumbar scoliosis  and disc degeneration. IMPRESSION: Negative for fracture Electronically Signed   By: Franchot Gallo M.D.   On: 11/08/2020 13:29   CT HEAD WO CONTRAST  Addendum Date: 11/08/2020   ADDENDUM REPORT: 11/08/2020 13:35 ADDENDUM: Findings  and recommendations discussed with Dr. Tomi Bamberger at 1:30 p.m. Electronically Signed   By: Margaretha Sheffield MD   On: 11/08/2020 13:35   Result Date: 11/08/2020 CLINICAL DATA:  Focal neuro deficit, > 6 hrs, stroke suspected. Fall. EXAM: CT HEAD WITHOUT CONTRAST CT CERVICAL SPINE WITHOUT CONTRAST TECHNIQUE: Multidetector CT imaging of the head and cervical spine was performed following the standard protocol without intravenous contrast. Multiplanar CT image reconstructions of the cervical spine were also generated. COMPARISON:  CT head 11/06/2020. FINDINGS: CT HEAD FINDINGS Brain: No evidence of acute large vascular territory infarction, hemorrhage, hydrocephalus, extra-axial collection or mass lesion/mass effect. Moderate patchy white matter hypoattenuation, likely related to chronic microvascular ischemic disease. Moderate atrophy. Vascular: No hyperdense vessel identified. Calcific intracranial atherosclerosis. Skull: No acute fracture. Sinuses/Orbits: Visualized sinuses are clear.  Unremarkable orbits. Other: Moderate left mastoid effusion. CT CERVICAL SPINE FINDINGS Alignment: Mild anterolisthesis of C3 on C4, favor degenerative given degenerative facet arthropathy at this level. Skull base and vertebrae: No evidence of acute fracture. Vertebral body heights are maintained. Diffuse osteopenia. Soft tissues and spinal canal: No large canal hematoma identified. No evidence of prevertebral edema. Disc levels: Severe degenerative change at the craniocervical junction with erosive change of the dens, landed dental narrowing and pannus along the posterior aspect of the dens. Multilevel degenerative disc disease, moderate to severe at C4-C5 and C5-C6 and moderate at C6-C7. There is disc height loss, endplate sclerosis and posterior spurring at these levels. Multilevel facet arthropathy. Upper chest: Biapical pleural scarring. Multiple nodule areas of consolidation with surrounding ground-glass opacification in bilateral  visualized lung apices. IMPRESSION: CT Head: 1. No evidence of acute large vascular territory infarct or acute hemorrhage. 2. Moderate chronic microvascular ischemic disease and atrophy. 3. Moderate left mastoid effusion. CT Cervical Spine: 1. No evidence of acute fracture or traumatic malalignment. 2. Multiple nodular areas of consolidation with surrounding ground-glass opacification in bilateral visualized lung apices, concerning for infection, septic emboli, or less likely metastatic disease, but incompletely imaged/characterized on this study. Recommend CT chest to further evaluate. 3. Severe craniocervical and moderate to severe intervertebral disc disease, described above. Electronically Signed: By: Margaretha Sheffield MD On: 11/08/2020 13:00   CT HEAD WO CONTRAST  Result Date: 11/07/2020 CLINICAL DATA:  Altered mental status. EXAM: CT HEAD WITHOUT CONTRAST TECHNIQUE: Contiguous axial images were obtained from the base of the skull through the vertex without intravenous contrast. COMPARISON:  August 03, 2020 FINDINGS: Brain: There is mild cerebral atrophy with widening of the extra-axial spaces and ventricular dilatation. There are areas of decreased attenuation within the white matter tracts of the supratentorial brain, consistent with microvascular disease changes. Vascular: No hyperdense vessel or unexpected calcification. Skull: Normal. Negative for fracture or focal lesion. Sinuses/Orbits: No acute finding. Other: None. IMPRESSION: 1. Generalized cerebral atrophy. 2. No acute intracranial abnormality. Electronically Signed   By: Virgina Norfolk M.D.   On: 11/07/2020 00:00   CT Chest W Contrast  Result Date: 11/08/2020 CLINICAL DATA:  Abnormal chest radiographs, lung nodules EXAM: CT CHEST WITH CONTRAST TECHNIQUE: Multidetector CT imaging of the chest was performed during intravenous contrast administration. CONTRAST:  38m OMNIPAQUE IOHEXOL 300 MG/ML  SOLN COMPARISON:  Same-day chest radiographs,  CT chest angiogram, 11/07/2020 FINDINGS: Cardiovascular: Aortic atherosclerosis. Normal heart size.  Three-vessel coronary artery calcifications. No pericardial effusion. Mediastinum/Nodes: No enlarged mediastinal, hilar, or axillary lymph nodes. Large hiatal hernia with intrathoracic position of the gastric body and fundus. Thyroid gland, trachea, and esophagus demonstrate no significant findings. Lungs/Pleura: Scattered ground-glass nodular airspace opacities throughout the upper lungs bilaterally. No pleural effusion or pneumothorax. Upper Abdomen: No acute abnormality. Musculoskeletal: No chest wall mass or suspicious bone lesions identified. IMPRESSION: 1. Scattered ground-glass nodular airspace opacities throughout the upper lungs bilaterally, nonspecific and infectious or inflammatory, and not changed compared to previous day's examination. 2. Large hiatal hernia with intrathoracic position of the gastric body and fundus. 3. Coronary artery disease. Aortic Atherosclerosis (ICD10-I70.0). Electronically Signed   By: Eddie Candle M.D.   On: 11/08/2020 14:48   CT Angio Chest Pulmonary Embolism (PE) W or WO Contrast  Result Date: 11/07/2020 CLINICAL DATA:  Altered mental status, shortness of breath low oxygen saturations and elevated white blood cell count. EXAM: CT ANGIOGRAPHY CHEST WITH CONTRAST TECHNIQUE: Multidetector CT imaging of the chest was performed using the standard protocol during bolus administration of intravenous contrast. Multiplanar CT image reconstructions and MIPs were obtained to evaluate the vascular anatomy. CONTRAST:  109m OMNIPAQUE IOHEXOL 350 MG/ML SOLN COMPARISON:  Chest x-ray 11/06/2020 FINDINGS: Cardiovascular: The heart is normal in size for age. No pericardial effusion. There is tortuosity, ectasia and calcification of the thoracic aorta but no focal aneurysm or obvious dissection. The pulmonary arterial tree is well opacified. No filling defects to suggest pulmonary embolism.  Mediastinum/Nodes: No mediastinal or hilar mass or lymphadenopathy. There is a large hiatal hernia. Lungs/Pleura: Mild emphysematous changes and pulmonary scarring. Dependent subpleural atelectasis noted bilaterally. There is also vascular crowding and basilar atelectasis due to moderate eventration of both hemidiaphragms and the left large hiatal hernia. No worrisome pulmonary lesions. No pleural effusions. Upper Abdomen: No significant upper abdominal findings. Aortic and branch vessel calcifications are noted. Musculoskeletal: Prior right mastectomy. No left breast mass. No supraclavicular or axillary adenopathy. The bony thorax is intact. Review of the MIP images confirms the above findings. IMPRESSION: 1. No CT findings for pulmonary embolism. 2. Tortuosity, ectasia and calcification of the thoracic aorta but no focal aneurysm or obvious dissection. 3. Mild emphysematous changes and pulmonary scarring. No acute overlying pulmonary process. 4. Large hiatal hernia. 5. Emphysema and aortic atherosclerosis. Aortic Atherosclerosis (ICD10-I70.0) and Emphysema (ICD10-J43.9). Electronically Signed   By: PMarijo SanesM.D.   On: 11/07/2020 05:50   CT CERVICAL SPINE WO CONTRAST  Addendum Date: 11/08/2020   ADDENDUM REPORT: 11/08/2020 13:35 ADDENDUM: Findings and recommendations discussed with Dr. KTomi Bambergerat 1:30 p.m. Electronically Signed   By: FMargaretha SheffieldMD   On: 11/08/2020 13:35   Result Date: 11/08/2020 CLINICAL DATA:  Focal neuro deficit, > 6 hrs, stroke suspected. Fall. EXAM: CT HEAD WITHOUT CONTRAST CT CERVICAL SPINE WITHOUT CONTRAST TECHNIQUE: Multidetector CT imaging of the head and cervical spine was performed following the standard protocol without intravenous contrast. Multiplanar CT image reconstructions of the cervical spine were also generated. COMPARISON:  CT head 11/06/2020. FINDINGS: CT HEAD FINDINGS Brain: No evidence of acute large vascular territory infarction, hemorrhage, hydrocephalus,  extra-axial collection or mass lesion/mass effect. Moderate patchy white matter hypoattenuation, likely related to chronic microvascular ischemic disease. Moderate atrophy. Vascular: No hyperdense vessel identified. Calcific intracranial atherosclerosis. Skull: No acute fracture. Sinuses/Orbits: Visualized sinuses are clear.  Unremarkable orbits. Other: Moderate left mastoid effusion. CT CERVICAL SPINE FINDINGS Alignment: Mild anterolisthesis of C3 on C4, favor degenerative given degenerative facet arthropathy at  this level. Skull base and vertebrae: No evidence of acute fracture. Vertebral body heights are maintained. Diffuse osteopenia. Soft tissues and spinal canal: No large canal hematoma identified. No evidence of prevertebral edema. Disc levels: Severe degenerative change at the craniocervical junction with erosive change of the dens, landed dental narrowing and pannus along the posterior aspect of the dens. Multilevel degenerative disc disease, moderate to severe at C4-C5 and C5-C6 and moderate at C6-C7. There is disc height loss, endplate sclerosis and posterior spurring at these levels. Multilevel facet arthropathy. Upper chest: Biapical pleural scarring. Multiple nodule areas of consolidation with surrounding ground-glass opacification in bilateral visualized lung apices. IMPRESSION: CT Head: 1. No evidence of acute large vascular territory infarct or acute hemorrhage. 2. Moderate chronic microvascular ischemic disease and atrophy. 3. Moderate left mastoid effusion. CT Cervical Spine: 1. No evidence of acute fracture or traumatic malalignment. 2. Multiple nodular areas of consolidation with surrounding ground-glass opacification in bilateral visualized lung apices, concerning for infection, septic emboli, or less likely metastatic disease, but incompletely imaged/characterized on this study. Recommend CT chest to further evaluate. 3. Severe craniocervical and moderate to severe intervertebral disc disease,  described above. Electronically Signed: By: Margaretha Sheffield MD On: 11/08/2020 13:00   DG Chest Portable 1 View  Result Date: 11/08/2020 CLINICAL DATA:  History of fall EXAM: PORTABLE CHEST 1 VIEW COMPARISON:  None. FINDINGS: Cardiac and mediastinal contours are within normal limits for AP technique. Mild left basilar opacity, likely atelectasis. Lungs otherwise clear. No large pleural effusion or evidence of pneumothorax. IMPRESSION: No acute radiographic findings Electronically Signed   By: Yetta Glassman MD   On: 11/08/2020 12:28   DG Chest Port 1 View  Result Date: 11/06/2020 CLINICAL DATA:  87 year old female with shortness of breath EXAM: PORTABLE CHEST 1 VIEW COMPARISON:  Chest radiograph dated 10/21/2020. FINDINGS: Shallow inspiration. Left lung base density may represent atelectasis or infiltrate. No large pleural effusion. No pneumothorax. Stable cardiac silhouette. Small hiatal hernia. Atherosclerotic calcification of the aorta. Osteopenia with degenerative changes of the spine and shoulders. No acute osseous pathology. IMPRESSION: Left lung base atelectasis versus infiltrate. Electronically Signed   By: Anner Crete M.D.   On: 11/06/2020 23:34    Procedures Procedures   Medications Ordered in ED Medications  iohexol (OMNIPAQUE) 300 MG/ML solution 50 mL (50 mLs Intravenous Contrast Given 11/08/20 1413)    ED Course  I have reviewed the triage vital signs and the nursing notes.  Pertinent labs & imaging results that were available during my care of the patient were reviewed by me and considered in my medical decision making (see chart for details).  Clinical Course as of 11/08/20 1525  Fri Nov 08, 2020  1334 CT c spine abnormal.  Recc ct chest [JK]  1335 Labs reviewed.  No significant abnormalities. [JK]  1342 Viewed imaging test.  No acute fracture.  Discussed findings with patient and patient's daughter in law.  Daughter-in-law at still would like to bring patient home.   They do have home care to assist [id number] CT scan does not show any change from yesterday.  Nonspecific inflammation noted.  Could be infectious or inflammatory [JK]    Clinical Course User Index [JK] Dorie Rank, MD   MDM Rules/Calculators/A&P                           Patient presented to the ED for evaluation of recurrent fall.  Additional information was provided  by the patient's daughter-in-law.  She cares for the patient at home.  No acute injuries noted today.  Laboratory tests are unremarkable.  CT scan does show some persistent inflammation in the upper lobes.  Questionable infectious etiology.  Urinalysis also does show possible infection.  It is unclear however as the patient's not really able to clearly tell me if she is having any symptoms because of her dementia.  Patient's daughter-in-law wants to take the patient home.  They have caregivers at the house.  Will discharge home with course of antibiotics for possible infection in the urine as well as lungs. Final Clinical Impression(s) / ED Diagnoses Final diagnoses:  Fall, initial encounter    Rx / DC Orders ED Discharge Orders          Ordered    amoxicillin-clavulanate (AUGMENTIN) 875-125 MG tablet  2 times daily        11/08/20 1523             Dorie Rank, MD 11/08/20 1526

## 2020-11-08 NOTE — ED Notes (Signed)
Patient transported to X-ray 

## 2020-11-08 NOTE — ED Triage Notes (Signed)
BIB EMS from home. Family found patient on the floor at 9am this morning no obvious trauma. She has been fall more at home recently and family wants to look into SNF. She is at her baseline, and takes a daily blood thinner.

## 2020-11-12 DIAGNOSIS — F0390 Unspecified dementia without behavioral disturbance: Secondary | ICD-10-CM | POA: Diagnosis not present

## 2020-11-12 DIAGNOSIS — I482 Chronic atrial fibrillation, unspecified: Secondary | ICD-10-CM | POA: Diagnosis not present

## 2020-11-12 DIAGNOSIS — R0602 Shortness of breath: Secondary | ICD-10-CM | POA: Diagnosis not present

## 2020-11-12 DIAGNOSIS — K8000 Calculus of gallbladder with acute cholecystitis without obstruction: Secondary | ICD-10-CM | POA: Diagnosis not present

## 2020-11-12 DIAGNOSIS — K5730 Diverticulosis of large intestine without perforation or abscess without bleeding: Secondary | ICD-10-CM | POA: Diagnosis not present

## 2020-11-12 DIAGNOSIS — K5732 Diverticulitis of large intestine without perforation or abscess without bleeding: Secondary | ICD-10-CM | POA: Diagnosis not present

## 2020-11-12 DIAGNOSIS — K5190 Ulcerative colitis, unspecified, without complications: Secondary | ICD-10-CM | POA: Diagnosis not present

## 2020-11-12 DIAGNOSIS — U071 COVID-19: Secondary | ICD-10-CM | POA: Diagnosis not present

## 2020-11-12 DIAGNOSIS — K219 Gastro-esophageal reflux disease without esophagitis: Secondary | ICD-10-CM | POA: Diagnosis not present

## 2020-11-13 DIAGNOSIS — R0602 Shortness of breath: Secondary | ICD-10-CM | POA: Diagnosis not present

## 2020-11-13 DIAGNOSIS — U071 COVID-19: Secondary | ICD-10-CM | POA: Diagnosis not present

## 2020-11-13 DIAGNOSIS — F0390 Unspecified dementia without behavioral disturbance: Secondary | ICD-10-CM | POA: Diagnosis not present

## 2020-11-13 DIAGNOSIS — K5732 Diverticulitis of large intestine without perforation or abscess without bleeding: Secondary | ICD-10-CM | POA: Diagnosis not present

## 2020-11-13 DIAGNOSIS — K219 Gastro-esophageal reflux disease without esophagitis: Secondary | ICD-10-CM | POA: Diagnosis not present

## 2020-11-13 DIAGNOSIS — K5730 Diverticulosis of large intestine without perforation or abscess without bleeding: Secondary | ICD-10-CM | POA: Diagnosis not present

## 2020-11-13 DIAGNOSIS — K8000 Calculus of gallbladder with acute cholecystitis without obstruction: Secondary | ICD-10-CM | POA: Diagnosis not present

## 2020-11-13 DIAGNOSIS — K5190 Ulcerative colitis, unspecified, without complications: Secondary | ICD-10-CM | POA: Diagnosis not present

## 2020-11-13 DIAGNOSIS — I482 Chronic atrial fibrillation, unspecified: Secondary | ICD-10-CM | POA: Diagnosis not present

## 2020-11-14 DIAGNOSIS — K5730 Diverticulosis of large intestine without perforation or abscess without bleeding: Secondary | ICD-10-CM | POA: Diagnosis not present

## 2020-11-14 DIAGNOSIS — K8000 Calculus of gallbladder with acute cholecystitis without obstruction: Secondary | ICD-10-CM | POA: Diagnosis not present

## 2020-11-14 DIAGNOSIS — K5190 Ulcerative colitis, unspecified, without complications: Secondary | ICD-10-CM | POA: Diagnosis not present

## 2020-11-14 DIAGNOSIS — K5732 Diverticulitis of large intestine without perforation or abscess without bleeding: Secondary | ICD-10-CM | POA: Diagnosis not present

## 2020-11-14 DIAGNOSIS — F0390 Unspecified dementia without behavioral disturbance: Secondary | ICD-10-CM | POA: Diagnosis not present

## 2020-11-14 DIAGNOSIS — U071 COVID-19: Secondary | ICD-10-CM | POA: Diagnosis not present

## 2020-11-14 DIAGNOSIS — I482 Chronic atrial fibrillation, unspecified: Secondary | ICD-10-CM | POA: Diagnosis not present

## 2020-11-14 DIAGNOSIS — K219 Gastro-esophageal reflux disease without esophagitis: Secondary | ICD-10-CM | POA: Diagnosis not present

## 2020-11-14 DIAGNOSIS — R0602 Shortness of breath: Secondary | ICD-10-CM | POA: Diagnosis not present

## 2020-11-19 DIAGNOSIS — F0390 Unspecified dementia without behavioral disturbance: Secondary | ICD-10-CM | POA: Diagnosis not present

## 2020-11-19 DIAGNOSIS — I482 Chronic atrial fibrillation, unspecified: Secondary | ICD-10-CM | POA: Diagnosis not present

## 2020-11-19 DIAGNOSIS — K219 Gastro-esophageal reflux disease without esophagitis: Secondary | ICD-10-CM | POA: Diagnosis not present

## 2020-11-19 DIAGNOSIS — K5732 Diverticulitis of large intestine without perforation or abscess without bleeding: Secondary | ICD-10-CM | POA: Diagnosis not present

## 2020-11-19 DIAGNOSIS — U071 COVID-19: Secondary | ICD-10-CM | POA: Diagnosis not present

## 2020-11-19 DIAGNOSIS — K8000 Calculus of gallbladder with acute cholecystitis without obstruction: Secondary | ICD-10-CM | POA: Diagnosis not present

## 2020-11-19 DIAGNOSIS — R0602 Shortness of breath: Secondary | ICD-10-CM | POA: Diagnosis not present

## 2020-11-19 DIAGNOSIS — K5190 Ulcerative colitis, unspecified, without complications: Secondary | ICD-10-CM | POA: Diagnosis not present

## 2020-11-19 DIAGNOSIS — K5730 Diverticulosis of large intestine without perforation or abscess without bleeding: Secondary | ICD-10-CM | POA: Diagnosis not present

## 2020-11-20 DIAGNOSIS — K5730 Diverticulosis of large intestine without perforation or abscess without bleeding: Secondary | ICD-10-CM | POA: Diagnosis not present

## 2020-11-20 DIAGNOSIS — I482 Chronic atrial fibrillation, unspecified: Secondary | ICD-10-CM | POA: Diagnosis not present

## 2020-11-20 DIAGNOSIS — K5732 Diverticulitis of large intestine without perforation or abscess without bleeding: Secondary | ICD-10-CM | POA: Diagnosis not present

## 2020-11-20 DIAGNOSIS — R0602 Shortness of breath: Secondary | ICD-10-CM | POA: Diagnosis not present

## 2020-11-20 DIAGNOSIS — F0390 Unspecified dementia without behavioral disturbance: Secondary | ICD-10-CM | POA: Diagnosis not present

## 2020-11-20 DIAGNOSIS — K219 Gastro-esophageal reflux disease without esophagitis: Secondary | ICD-10-CM | POA: Diagnosis not present

## 2020-11-20 DIAGNOSIS — U071 COVID-19: Secondary | ICD-10-CM | POA: Diagnosis not present

## 2020-11-20 DIAGNOSIS — K8000 Calculus of gallbladder with acute cholecystitis without obstruction: Secondary | ICD-10-CM | POA: Diagnosis not present

## 2020-11-20 DIAGNOSIS — K5190 Ulcerative colitis, unspecified, without complications: Secondary | ICD-10-CM | POA: Diagnosis not present

## 2020-11-21 DIAGNOSIS — K5730 Diverticulosis of large intestine without perforation or abscess without bleeding: Secondary | ICD-10-CM | POA: Diagnosis not present

## 2020-11-21 DIAGNOSIS — U071 COVID-19: Secondary | ICD-10-CM | POA: Diagnosis not present

## 2020-11-21 DIAGNOSIS — R0602 Shortness of breath: Secondary | ICD-10-CM | POA: Diagnosis not present

## 2020-11-21 DIAGNOSIS — I482 Chronic atrial fibrillation, unspecified: Secondary | ICD-10-CM | POA: Diagnosis not present

## 2020-11-21 DIAGNOSIS — K8000 Calculus of gallbladder with acute cholecystitis without obstruction: Secondary | ICD-10-CM | POA: Diagnosis not present

## 2020-11-21 DIAGNOSIS — F0390 Unspecified dementia without behavioral disturbance: Secondary | ICD-10-CM | POA: Diagnosis not present

## 2020-11-21 DIAGNOSIS — K5732 Diverticulitis of large intestine without perforation or abscess without bleeding: Secondary | ICD-10-CM | POA: Diagnosis not present

## 2020-11-21 DIAGNOSIS — K5190 Ulcerative colitis, unspecified, without complications: Secondary | ICD-10-CM | POA: Diagnosis not present

## 2020-11-21 DIAGNOSIS — K219 Gastro-esophageal reflux disease without esophagitis: Secondary | ICD-10-CM | POA: Diagnosis not present

## 2020-11-25 DIAGNOSIS — U071 COVID-19: Secondary | ICD-10-CM | POA: Diagnosis not present

## 2020-11-25 DIAGNOSIS — K5732 Diverticulitis of large intestine without perforation or abscess without bleeding: Secondary | ICD-10-CM | POA: Diagnosis not present

## 2020-11-25 DIAGNOSIS — R0602 Shortness of breath: Secondary | ICD-10-CM | POA: Diagnosis not present

## 2020-11-25 DIAGNOSIS — K5190 Ulcerative colitis, unspecified, without complications: Secondary | ICD-10-CM | POA: Diagnosis not present

## 2020-11-25 DIAGNOSIS — I482 Chronic atrial fibrillation, unspecified: Secondary | ICD-10-CM | POA: Diagnosis not present

## 2020-11-25 DIAGNOSIS — F0390 Unspecified dementia without behavioral disturbance: Secondary | ICD-10-CM | POA: Diagnosis not present

## 2020-11-25 DIAGNOSIS — K5730 Diverticulosis of large intestine without perforation or abscess without bleeding: Secondary | ICD-10-CM | POA: Diagnosis not present

## 2020-11-25 DIAGNOSIS — K8000 Calculus of gallbladder with acute cholecystitis without obstruction: Secondary | ICD-10-CM | POA: Diagnosis not present

## 2020-11-25 DIAGNOSIS — K219 Gastro-esophageal reflux disease without esophagitis: Secondary | ICD-10-CM | POA: Diagnosis not present

## 2020-11-26 DIAGNOSIS — K219 Gastro-esophageal reflux disease without esophagitis: Secondary | ICD-10-CM | POA: Diagnosis not present

## 2020-11-26 DIAGNOSIS — K5732 Diverticulitis of large intestine without perforation or abscess without bleeding: Secondary | ICD-10-CM | POA: Diagnosis not present

## 2020-11-26 DIAGNOSIS — K5730 Diverticulosis of large intestine without perforation or abscess without bleeding: Secondary | ICD-10-CM | POA: Diagnosis not present

## 2020-11-26 DIAGNOSIS — K8000 Calculus of gallbladder with acute cholecystitis without obstruction: Secondary | ICD-10-CM | POA: Diagnosis not present

## 2020-11-26 DIAGNOSIS — I482 Chronic atrial fibrillation, unspecified: Secondary | ICD-10-CM | POA: Diagnosis not present

## 2020-11-26 DIAGNOSIS — F0390 Unspecified dementia without behavioral disturbance: Secondary | ICD-10-CM | POA: Diagnosis not present

## 2020-11-26 DIAGNOSIS — K5190 Ulcerative colitis, unspecified, without complications: Secondary | ICD-10-CM | POA: Diagnosis not present

## 2020-11-26 DIAGNOSIS — R0602 Shortness of breath: Secondary | ICD-10-CM | POA: Diagnosis not present

## 2020-11-26 DIAGNOSIS — U071 COVID-19: Secondary | ICD-10-CM | POA: Diagnosis not present

## 2020-11-28 DIAGNOSIS — K8000 Calculus of gallbladder with acute cholecystitis without obstruction: Secondary | ICD-10-CM | POA: Diagnosis not present

## 2020-11-28 DIAGNOSIS — K5732 Diverticulitis of large intestine without perforation or abscess without bleeding: Secondary | ICD-10-CM | POA: Diagnosis not present

## 2020-11-28 DIAGNOSIS — K5730 Diverticulosis of large intestine without perforation or abscess without bleeding: Secondary | ICD-10-CM | POA: Diagnosis not present

## 2020-11-28 DIAGNOSIS — R0602 Shortness of breath: Secondary | ICD-10-CM | POA: Diagnosis not present

## 2020-11-28 DIAGNOSIS — K219 Gastro-esophageal reflux disease without esophagitis: Secondary | ICD-10-CM | POA: Diagnosis not present

## 2020-11-28 DIAGNOSIS — U071 COVID-19: Secondary | ICD-10-CM | POA: Diagnosis not present

## 2020-11-28 DIAGNOSIS — K5190 Ulcerative colitis, unspecified, without complications: Secondary | ICD-10-CM | POA: Diagnosis not present

## 2020-11-28 DIAGNOSIS — F0390 Unspecified dementia without behavioral disturbance: Secondary | ICD-10-CM | POA: Diagnosis not present

## 2020-11-28 DIAGNOSIS — I482 Chronic atrial fibrillation, unspecified: Secondary | ICD-10-CM | POA: Diagnosis not present

## 2020-12-01 ENCOUNTER — Emergency Department (HOSPITAL_COMMUNITY)
Admission: EM | Admit: 2020-12-01 | Discharge: 2020-12-01 | Disposition: A | Discharge status: Home / Self Care | Payer: Medicare HMO | Attending: Emergency Medicine | Admitting: Emergency Medicine

## 2020-12-01 ENCOUNTER — Encounter (HOSPITAL_COMMUNITY): Payer: Self-pay | Admitting: Oncology

## 2020-12-01 ENCOUNTER — Emergency Department (HOSPITAL_COMMUNITY): Payer: Medicare HMO

## 2020-12-01 ENCOUNTER — Other Ambulatory Visit: Payer: Self-pay

## 2020-12-01 DIAGNOSIS — Z20822 Contact with and (suspected) exposure to covid-19: Secondary | ICD-10-CM | POA: Diagnosis not present

## 2020-12-01 DIAGNOSIS — S0990XA Unspecified injury of head, initial encounter: Secondary | ICD-10-CM | POA: Diagnosis not present

## 2020-12-01 DIAGNOSIS — K449 Diaphragmatic hernia without obstruction or gangrene: Secondary | ICD-10-CM | POA: Diagnosis not present

## 2020-12-01 DIAGNOSIS — I10 Essential (primary) hypertension: Secondary | ICD-10-CM | POA: Insufficient documentation

## 2020-12-01 DIAGNOSIS — Z043 Encounter for examination and observation following other accident: Secondary | ICD-10-CM | POA: Insufficient documentation

## 2020-12-01 DIAGNOSIS — F0390 Unspecified dementia without behavioral disturbance: Secondary | ICD-10-CM | POA: Insufficient documentation

## 2020-12-01 DIAGNOSIS — R296 Repeated falls: Secondary | ICD-10-CM | POA: Diagnosis not present

## 2020-12-01 DIAGNOSIS — Z7901 Long term (current) use of anticoagulants: Secondary | ICD-10-CM | POA: Diagnosis not present

## 2020-12-01 DIAGNOSIS — Z87891 Personal history of nicotine dependence: Secondary | ICD-10-CM | POA: Diagnosis not present

## 2020-12-01 DIAGNOSIS — W19XXXA Unspecified fall, initial encounter: Secondary | ICD-10-CM

## 2020-12-01 DIAGNOSIS — R109 Unspecified abdominal pain: Secondary | ICD-10-CM | POA: Diagnosis not present

## 2020-12-01 DIAGNOSIS — S3991XA Unspecified injury of abdomen, initial encounter: Secondary | ICD-10-CM | POA: Diagnosis not present

## 2020-12-01 DIAGNOSIS — M542 Cervicalgia: Secondary | ICD-10-CM | POA: Diagnosis not present

## 2020-12-01 DIAGNOSIS — K5730 Diverticulosis of large intestine without perforation or abscess without bleeding: Secondary | ICD-10-CM | POA: Diagnosis not present

## 2020-12-01 LAB — CBC WITH DIFFERENTIAL/PLATELET
Abs Immature Granulocytes: 0.03 10*3/uL (ref 0.00–0.07)
Basophils Absolute: 0 10*3/uL (ref 0.0–0.1)
Basophils Relative: 1 %
Eosinophils Absolute: 0.1 10*3/uL (ref 0.0–0.5)
Eosinophils Relative: 2 %
HCT: 42.3 % (ref 36.0–46.0)
Hemoglobin: 13.6 g/dL (ref 12.0–15.0)
Immature Granulocytes: 0 %
Lymphocytes Relative: 21 %
Lymphs Abs: 1.8 10*3/uL (ref 0.7–4.0)
MCH: 31.1 pg (ref 26.0–34.0)
MCHC: 32.2 g/dL (ref 30.0–36.0)
MCV: 96.6 fL (ref 80.0–100.0)
Monocytes Absolute: 0.8 10*3/uL (ref 0.1–1.0)
Monocytes Relative: 9 %
Neutro Abs: 5.9 10*3/uL (ref 1.7–7.7)
Neutrophils Relative %: 67 %
Platelets: 243 10*3/uL (ref 150–400)
RBC: 4.38 MIL/uL (ref 3.87–5.11)
RDW: 13.2 % (ref 11.5–15.5)
WBC: 8.7 10*3/uL (ref 4.0–10.5)
nRBC: 0 % (ref 0.0–0.2)

## 2020-12-01 LAB — COMPREHENSIVE METABOLIC PANEL
ALT: 20 U/L (ref 0–44)
AST: 24 U/L (ref 15–41)
Albumin: 3.8 g/dL (ref 3.5–5.0)
Alkaline Phosphatase: 71 U/L (ref 38–126)
Anion gap: 9 (ref 5–15)
BUN: 6 mg/dL — ABNORMAL LOW (ref 8–23)
CO2: 27 mmol/L (ref 22–32)
Calcium: 9.2 mg/dL (ref 8.9–10.3)
Chloride: 105 mmol/L (ref 98–111)
Creatinine, Ser: 0.6 mg/dL (ref 0.44–1.00)
GFR, Estimated: >60 mL/min (ref >60)
Glucose, Bld: 101 mg/dL — ABNORMAL HIGH (ref 70–99)
Potassium: 3.8 mmol/L (ref 3.5–5.1)
Sodium: 141 mmol/L (ref 135–145)
Total Bilirubin: 0.6 mg/dL (ref 0.3–1.2)
Total Protein: 7.2 g/dL (ref 6.5–8.1)

## 2020-12-01 LAB — URINALYSIS, ROUTINE W REFLEX MICROSCOPIC
Bacteria, UA: NONE SEEN
Bilirubin Urine: NEGATIVE
Glucose, UA: NEGATIVE mg/dL
Hgb urine dipstick: NEGATIVE
Ketones, ur: NEGATIVE mg/dL
Nitrite: NEGATIVE
Protein, ur: NEGATIVE mg/dL
Specific Gravity, Urine: 1.018 (ref 1.005–1.030)
pH: 8 (ref 5.0–8.0)

## 2020-12-01 LAB — PROTIME-INR
INR: 1.1 (ref 0.8–1.2)
Prothrombin Time: 14.1 seconds (ref 11.4–15.2)

## 2020-12-01 LAB — RESP PANEL BY RT-PCR (FLU A&B, COVID) ARPGX2
Influenza A by PCR: NEGATIVE
Influenza B by PCR: NEGATIVE
SARS Coronavirus 2 by RT PCR: NEGATIVE

## 2020-12-01 MED ORDER — IOHEXOL 350 MG/ML SOLN
80.0000 mL | Freq: Once | INTRAVENOUS | Status: AC | PRN
  Administered 2020-12-01: 80 mL via INTRAVENOUS

## 2020-12-01 NOTE — ED Triage Notes (Signed)
Pt presents s/p fall w/ no injury.  Pt's caregiver is concerned about bright green stool, intermittent swelling.  Pt w/ hx of dementia however has been more confused.

## 2020-12-01 NOTE — Discharge Instructions (Addendum)
Your imaging work-up was negative for acute abnormalities.  No evidence of bleeding in your brain.  No evidence of traumatic injury to your cervical spine, abdomen or pelvis.  No evidence of fracture of your ribs.  Your laboratory work-up was unremarkable.  Recommend routine PCP follow-up.

## 2020-12-01 NOTE — ED Provider Notes (Signed)
[facility] DEPT Provider Note   CSN: [id number] Arrival date & time: 12/01/20  [id number]     History Chief Complaint  Patient presents with   [name] is a 87 y.o. female.  The history is provided by the patient.  Fall This is a new problem. The current episode started 1 to 2 hours ago. Pertinent negatives include no chest pain, no abdominal pain, no headaches and no shortness of breath.   87 year old female with history of atrial fibrillation on Eliquis, osteoarthritis, breast cancer in remission, hypertension, dementia, recent COVID-19 infection on 10/14/2020 who presents emergency department after an unwitnessed fall earlier today.  The history is provided by the patient's daughter due to the patient's dementia.  Patient's daughter states that the patient was in the next room when she heard a pad and found the patient on the ground.  No loss of consciousness as the patient was found alert immediately after the fall.  No obvious trauma.  The patient remained at her baseline mental status.  Due to her history of being on anticoagulation (did not take her medicine this morning), she was taken to the emergency department for further evaluation.  She arrived GCS 14, ABC intact.   On arrival, the patient's daughter noted that her stool had been bright green for the past day or so. Level 5 caveat due to patient dementia.    Past Medical History:  Diagnosis Date   Arthritis    low spine   Breast cancer ([place]) 2013   right breast   Breast cancer, right breast, recurrent 06/03/2011   Cancer ([place]) 1989   breast right   Chronic back pain    Hypertension     Patient Active Problem List   Diagnosis Date Noted   DNR (do not resuscitate) 10/21/2020   Abdominal discomfort, epigastric    Goals of care, counseling/discussion    Palliative care by specialist    Abdominal pain 11/02/2018   Nausea and vomiting 11/02/2018   Dyslipidemia 11/02/2018    Chronic back pain 11/02/2018   Acute cholecystitis 11/02/2018   Cholelithiasis 11/01/2018   Atrial fibrillation, chronic ([place]) 10/18/2018   Gallstones 10/18/2018   Atrial fibrillation, new onset ([place]) 09/13/2016   Chronic anticoagulation 09/13/2016   Essential hypertension 09/13/2016   Chest pain with moderate risk of acute coronary syndrome 09/12/2016   Breast cancer, right breast, recurrent 06/03/2011   Personal history of malignant neoplasm of breast 04/14/1987    Past Surgical History:  Procedure Laterality Date   [place]   rt lumpectomy-axillary dissection   COLONOSCOPY     EYE SURGERY     both cataracts   JOINT REPLACEMENT  2004, 2005   both knees    MASTECTOMY Right 06/11/11   right breast   SHOULDER SURGERY     patient does not remember date of procedure     OB History   No obstetric history on file.     Family History  Problem Relation Age of Onset   Heart disease Mother    Breast cancer Maternal Aunt    Cancer Brother        unaware   Heart disease Brother     Social History   Tobacco Use   Smoking status: Former    Types: Cigarettes    Quit date: 05/11/1970    Years since quitting: 50.5   Smokeless tobacco: Never  Vaping Use  Vaping Use: Never used  Substance Use Topics   Alcohol use: No   Drug use: No    Home Medications Prior to Admission medications   Medication Sig Start Date End Date Taking? Authorizing Provider  acetaminophen (TYLENOL) 325 MG tablet Take 2 tablets (650 mg total) by mouth every 6 (six) hours as needed for mild pain (or Fever >/= 101). 10/28/20   Cherene Altes, MD  amoxicillin-clavulanate (AUGMENTIN) 875-125 MG tablet Take 1 tablet by mouth 2 (two) times daily. 11/08/20   Dorie Rank, MD  calcium carbonate (OSCAL) 1500 (600 Ca) MG TABS tablet Take 600 mg of elemental calcium by mouth daily.    [provider]  diltiazem (CARDIZEM CD) 240 MG 24 hr capsule Take 1 capsule  (240 mg total) by mouth daily. 10/29/20   Cherene Altes, MD  ELIQUIS 5 MG TABS tablet TAKE 1 TABLET BY MOUTH TWICE A DAY Patient taking differently: Take 5 mg by mouth 2 (two) times daily. 07/14/18   Croitoru, Mihai, MD  HYDROcodone-acetaminophen (NORCO/VICODIN) 5-325 MG tablet Take 1 tablet by mouth 3 (three) times daily as needed for pain. 11/05/20   [provider]  loperamide (IMODIUM) 2 MG capsule Take 1 capsule (2 mg total) by mouth as needed for diarrhea or loose stools. 10/28/20   Cherene Altes, MD  losartan (COZAAR) 50 MG tablet Take 1 tablet (50 mg total) by mouth daily. 10/29/20   Cherene Altes, MD  Multiple Vitamins-Minerals (CENTRUM SILVER 50+WOMEN PO) Take 1 tablet by mouth daily with breakfast.    [provider]  ondansetron (ZOFRAN) 4 MG tablet Take 1 tablet (4 mg total) by mouth every 8 (eight) hours as needed for nausea or vomiting. 10/29/18   Benay Pike, MD  pantoprazole (PROTONIX) 40 MG tablet Take 1 tablet (40 mg total) by mouth daily. 11/11/18   Samuella Cota, MD  predniSONE (DELTASONE) 2.5 MG tablet Take 1.25 mg by mouth daily.  12/27/17   [provider]  sertraline (ZOLOFT) 50 MG tablet Take 50 mg by mouth every morning.  02/04/18   [provider]  vitamin C (ASCORBIC ACID) 500 MG tablet Take 500 mg by mouth daily.    [provider]  VITAMIN E PO Take 1,000 Units by mouth daily.     [provider]    Allergies    Arimidex [anastrozole], Fish allergy, Morphine and related, and Shellfish allergy  Review of Systems   Review of Systems  Unable to perform ROS: Dementia  Respiratory:  Negative for shortness of breath.   Cardiovascular:  Negative for chest pain.  Gastrointestinal:  Negative for abdominal pain.  Neurological:  Negative for headaches.   Physical Exam Updated Vital Signs BP (!) 157/104 (BP Location: Left Arm)   Pulse 93   Temp 98 F (36.7 C) (Oral)   Resp 15   SpO2 99%    Physical Exam Vitals and nursing note reviewed.  Constitutional:      General: She is not in acute distress.    Appearance: She is well-developed.     Comments: GCS 15, ABC intact  HENT:     Head: Normocephalic and atraumatic.  Eyes:     Extraocular Movements: Extraocular movements intact.     Conjunctiva/sclera: Conjunctivae normal.     Pupils: Pupils are equal, round, and reactive to light.  Neck:     Comments: No midline tenderness to palpation of the cervical spine.  Range of motion intact Cardiovascular:  Rate and Rhythm: Normal rate and regular rhythm.     Heart sounds: No murmur heard. Pulmonary:     Effort: Pulmonary effort is normal. No respiratory distress.     Breath sounds: Normal breath sounds.  Chest:     Comments: Clavicles stable nontender to AP compression.  Chest wall stable and nontender to AP and lateral compression. Abdominal:     Palpations: Abdomen is soft.     Tenderness: There is no abdominal tenderness.  Musculoskeletal:     Cervical back: Neck supple.     Comments: No midline tenderness to palpation of the thoracic or lumbar spine.  Extremities atraumatic with intact range of motion  Skin:    General: Skin is warm and dry.  Neurological:     Mental Status: She is alert.     Comments: Cranial nerves II through XII grossly intact.  Moving all 4 extremities spontaneously.  Sensation grossly intact all 4 extremities    ED Results / Procedures / Treatments   Labs (all labs ordered are listed, but only abnormal results are displayed) Labs Reviewed  COMPREHENSIVE METABOLIC PANEL - Abnormal; Notable for the following components:      Result Value   Glucose, Bld 101 (*)    BUN 6 (*)    All other components within normal limits  URINALYSIS, ROUTINE W REFLEX MICROSCOPIC - Abnormal; Notable for the following components:   Color, Urine COLORLESS (*)    Leukocytes,Ua TRACE (*)    All other components within normal limits  PROTIME-INR - Abnormal;  Notable for the following components:   Prothrombin Time 50.4 (*)    INR 5.6 (*)    All other components within normal limits  RESP PANEL BY RT-PCR (FLU A&B, COVID) ARPGX2  CBC WITH DIFFERENTIAL/PLATELET  PROTIME-INR    EKG None  Radiology CT HEAD WO CONTRAST  Result Date: 12/01/2020 CLINICAL DATA:  Multiple falls.  Head trauma.  Neck pain. EXAM: CT HEAD WITHOUT CONTRAST CT CERVICAL SPINE WITHOUT CONTRAST TECHNIQUE: Multidetector CT imaging of the head and cervical spine was performed following the standard protocol without intravenous contrast. Multiplanar CT image reconstructions of the cervical spine were also generated. COMPARISON:  11/08/2020 FINDINGS: CT HEAD FINDINGS Brain: No evidence of acute infarction, hemorrhage, hydrocephalus, extra-axial collection or mass lesion/mass effect. There is age appropriate ventricular sulcal enlargement. Patchy areas of white matter hypoattenuation are noted consistent with mild chronic microvascular ischemic change. Vascular: No hyperdense vessel or unexpected calcification. Skull: Normal. Negative for fracture or focal lesion. Sinuses/Orbits: Globes and orbits are unremarkable. Sinuses are clear. Other: None. CT CERVICAL SPINE FINDINGS Alignment: Normal. Skull base and vertebrae: No acute fracture. No primary bone lesion or focal pathologic process. Soft tissues and spinal canal: No prevertebral fluid or swelling. No visible canal hematoma. Disc levels: Moderate to marked loss of disc height at C4-C5, C5-C6 and C6-C7 with disc bulging and endplate spurring. No convincing disc herniation. Bilateral upper cervical spine facet degenerative change, greater on the left. Upper chest: Small areas of ground-glass opacity in the upper lungs, smaller than on the prior cervical CT. Apical pleuroparenchymal scarring, stable. No acute findings. Other: None. IMPRESSION: HEAD CT 1. No acute intracranial abnormalities. CERVICAL CT 1. No fracture or acute finding.  Electronically Signed   By: Lajean Manes M.D.   On: 12/01/2020 12:45   CT CERVICAL SPINE WO CONTRAST  Result Date: 12/01/2020 CLINICAL DATA:  Multiple falls.  Head trauma.  Neck pain. EXAM: CT HEAD WITHOUT CONTRAST CT CERVICAL SPINE  WITHOUT CONTRAST TECHNIQUE: Multidetector CT imaging of the head and cervical spine was performed following the standard protocol without intravenous contrast. Multiplanar CT image reconstructions of the cervical spine were also generated. COMPARISON:  11/08/2020 FINDINGS: CT HEAD FINDINGS Brain: No evidence of acute infarction, hemorrhage, hydrocephalus, extra-axial collection or mass lesion/mass effect. There is age appropriate ventricular sulcal enlargement. Patchy areas of white matter hypoattenuation are noted consistent with mild chronic microvascular ischemic change. Vascular: No hyperdense vessel or unexpected calcification. Skull: Normal. Negative for fracture or focal lesion. Sinuses/Orbits: Globes and orbits are unremarkable. Sinuses are clear. Other: None. CT CERVICAL SPINE FINDINGS Alignment: Normal. Skull base and vertebrae: No acute fracture. No primary bone lesion or focal pathologic process. Soft tissues and spinal canal: No prevertebral fluid or swelling. No visible canal hematoma. Disc levels: Moderate to marked loss of disc height at C4-C5, C5-C6 and C6-C7 with disc bulging and endplate spurring. No convincing disc herniation. Bilateral upper cervical spine facet degenerative change, greater on the left. Upper chest: Small areas of ground-glass opacity in the upper lungs, smaller than on the prior cervical CT. Apical pleuroparenchymal scarring, stable. No acute findings. Other: None. IMPRESSION: HEAD CT 1. No acute intracranial abnormalities. CERVICAL CT 1. No fracture or acute finding. Electronically Signed   By: Lajean Manes M.D.   On: 12/01/2020 12:45   CT ABDOMEN PELVIS W CONTRAST  Result Date: 12/01/2020 CLINICAL DATA:  Abdominal trauma, multiple falls,  recent COVID EXAM: CT ABDOMEN AND PELVIS WITH CONTRAST TECHNIQUE: Multidetector CT imaging of the abdomen and pelvis was performed using the standard protocol following bolus administration of intravenous contrast. CONTRAST:  62m OMNIPAQUE IOHEXOL 350 MG/ML SOLN COMPARISON:  10/21/2020 FINDINGS: Lower chest: moderate hiatal hernia, incompletely visualized. No pleural or pericardial effusion. Scattered coronary calcifications. Dependent atelectasis posteriorly in both lung bases. Hepatobiliary: Multiple subcentimeter partially calcified stones layer in the dependent aspect of the nondilated gallbladder. No focal liver lesion or biliary ductal dilatation. Pancreas: Unremarkable. No pancreatic ductal dilatation or surrounding inflammatory changes. Spleen: Normal in size without focal abnormality. Adrenals/Urinary Tract: Normal adrenal glands. 1.9 cm probable simple cyst from the lower pole right kidney as before. No evident urolithiasis or hydronephrosis. Urinary bladder is physiologically distended. Stomach/Bowel: Moderate hiatal hernia, incompletely visualized. The stomach is nondilated. The small bowel is decompressed. Appendix not discretely identified. The colon is nondilated. Scattered distal descending and sigmoid diverticula without significant adjacent inflammatory change. Vascular/Lymphatic: Extensive aortoiliac calcified plaque without aneurysm. No abdominal or pelvic adenopathy. Retroaortic left renal vein, an anatomic variant. Reproductive: Status post hysterectomy. No adnexal masses. Other: Bilateral pelvic phleboliths.  No ascites.  No free air. Musculoskeletal: Multilevel spondylitic changes in the lower thoracic and lumbar spine without fracture or other acute bone abnormality. IMPRESSION: 1. No acute findings. 2. Sigmoid diverticulosis 3. Moderate hiatal hernia, incompletely visualized. 4. Coronary and aortic Atherosclerosis (ICD10-170.0). Electronically Signed   By: DLucrezia EuropeM.D.   On: 12/01/2020  12:55   DG Pelvis Portable  Result Date: 12/01/2020 CLINICAL DATA:  Fall. EXAM: PORTABLE PELVIS 1-2 VIEWS COMPARISON:  11/08/2020 FINDINGS: Very limited and rotated pelvis. No gross fracture or diastasis. Lower lumbar spine and symphysis pubis degeneration. Urinary tubing and EKG leads overlap the area of interest. The tubing especially obscures the foreshortened right femoral neck. IMPRESSION: Limited study due to rotation, nondiagnostic at the level of the right proximal femur. No acute finding. Electronically Signed   By: JMonte FantasiaM.D.   On: 12/01/2020 11:25   [facility]  Result Date: 12/01/2020 CLINICAL DATA:  Fall EXAM: PORTABLE CHEST 1 VIEW COMPARISON:  11/08/2020 FINDINGS: Normal heart size. Moderate to large hiatal hernia. There is no edema, consolidation, effusion, or pneumothorax. No detected fracture. Postoperative right axilla. IMPRESSION: No evidence of active disease. Electronically Signed   By: Monte Fantasia M.D.   On: 12/01/2020 11:24    Procedures Procedures   Medications Ordered in ED Medications  iohexol (OMNIPAQUE) 350 MG/ML injection 80 mL (80 mLs Intravenous Contrast Given 12/01/20 1210)    ED Course  I have reviewed the triage vital signs and the nursing notes.  Pertinent labs & imaging results that were available during my care of the patient were reviewed by me and considered in my medical decision making (see chart for details).    MDM Rules/Calculators/A&P                           TYAJAH BIDDICK is a 87 y.o. female who presents by EMS as an unleveled Trauma patient after a ground level fall as per above.  Currently, she is awake, alert, and protecting  own airway and is hemodynamically stable. She arrived GCS 14, ABC intact, at her baseline mental status. She is currently on Eliquis for atrial fibrillation.  Trauma imaging revealed (full reports in EMR): Portable CXR:  No evidence of pneumothorax or tracheal deviation Portable Pelvis:   No evidence of acute hip fracture or malalignment FAST:  Not performed CT scans: CT head without acute intracranial abnormality.  CT cervical spine without acute fracture or malalignment.  CT abdomen pelvis without abnormality in the abdomen or pelvis.  No fractures.  Of note, the patient's stool has recently been bright green.  Suspect likely excess bile in the stool as the patient has recently had looser stool which could result in the bile not being properly broken down in the intestines.  She does not appear to be dehydrated on exam and is tolerating oral intake.  There were no significant lab abnormalities.  PT/INR initially elevated, suspected lab abnormality and rechecked, repeat check normal.  The patient is at her baseline mental status, ambulatory in the emergency department, with no traumatic injuries identified on primary secondary survey or on imaging work-up.  Strict return precautions were provided to the patient's daughter regarding any mental status change.  Plan will be for continued observation at home.  The patient has caregivers at the house and the patient's daughter-in-law is comfortable with plan for discharge home.   Final Clinical Impression(s) / ED Diagnoses Final diagnoses:  Fall    Rx / DC Orders ED Discharge Orders     None        Regan Lemming, MD 12/02/20 1300

## 2020-12-03 DIAGNOSIS — F0390 Unspecified dementia without behavioral disturbance: Secondary | ICD-10-CM | POA: Diagnosis not present

## 2020-12-03 DIAGNOSIS — K5730 Diverticulosis of large intestine without perforation or abscess without bleeding: Secondary | ICD-10-CM | POA: Diagnosis not present

## 2020-12-03 DIAGNOSIS — K5732 Diverticulitis of large intestine without perforation or abscess without bleeding: Secondary | ICD-10-CM | POA: Diagnosis not present

## 2020-12-03 DIAGNOSIS — K8000 Calculus of gallbladder with acute cholecystitis without obstruction: Secondary | ICD-10-CM | POA: Diagnosis not present

## 2020-12-03 DIAGNOSIS — I482 Chronic atrial fibrillation, unspecified: Secondary | ICD-10-CM | POA: Diagnosis not present

## 2020-12-03 DIAGNOSIS — U071 COVID-19: Secondary | ICD-10-CM | POA: Diagnosis not present

## 2020-12-03 DIAGNOSIS — K5190 Ulcerative colitis, unspecified, without complications: Secondary | ICD-10-CM | POA: Diagnosis not present

## 2020-12-03 DIAGNOSIS — K219 Gastro-esophageal reflux disease without esophagitis: Secondary | ICD-10-CM | POA: Diagnosis not present

## 2020-12-03 DIAGNOSIS — R0602 Shortness of breath: Secondary | ICD-10-CM | POA: Diagnosis not present

## 2020-12-03 LAB — PROTIME-INR

## 2020-12-04 DIAGNOSIS — R0602 Shortness of breath: Secondary | ICD-10-CM | POA: Diagnosis not present

## 2020-12-04 DIAGNOSIS — K5732 Diverticulitis of large intestine without perforation or abscess without bleeding: Secondary | ICD-10-CM | POA: Diagnosis not present

## 2020-12-04 DIAGNOSIS — K219 Gastro-esophageal reflux disease without esophagitis: Secondary | ICD-10-CM | POA: Diagnosis not present

## 2020-12-04 DIAGNOSIS — I482 Chronic atrial fibrillation, unspecified: Secondary | ICD-10-CM | POA: Diagnosis not present

## 2020-12-04 DIAGNOSIS — F0390 Unspecified dementia without behavioral disturbance: Secondary | ICD-10-CM | POA: Diagnosis not present

## 2020-12-04 DIAGNOSIS — K5730 Diverticulosis of large intestine without perforation or abscess without bleeding: Secondary | ICD-10-CM | POA: Diagnosis not present

## 2020-12-04 DIAGNOSIS — K5190 Ulcerative colitis, unspecified, without complications: Secondary | ICD-10-CM | POA: Diagnosis not present

## 2020-12-04 DIAGNOSIS — K8000 Calculus of gallbladder with acute cholecystitis without obstruction: Secondary | ICD-10-CM | POA: Diagnosis not present

## 2020-12-04 DIAGNOSIS — U071 COVID-19: Secondary | ICD-10-CM | POA: Diagnosis not present

## 2020-12-05 DIAGNOSIS — K219 Gastro-esophageal reflux disease without esophagitis: Secondary | ICD-10-CM | POA: Diagnosis not present

## 2020-12-05 DIAGNOSIS — I482 Chronic atrial fibrillation, unspecified: Secondary | ICD-10-CM | POA: Diagnosis not present

## 2020-12-05 DIAGNOSIS — K8000 Calculus of gallbladder with acute cholecystitis without obstruction: Secondary | ICD-10-CM | POA: Diagnosis not present

## 2020-12-05 DIAGNOSIS — K5732 Diverticulitis of large intestine without perforation or abscess without bleeding: Secondary | ICD-10-CM | POA: Diagnosis not present

## 2020-12-05 DIAGNOSIS — R0602 Shortness of breath: Secondary | ICD-10-CM | POA: Diagnosis not present

## 2020-12-05 DIAGNOSIS — U071 COVID-19: Secondary | ICD-10-CM | POA: Diagnosis not present

## 2020-12-05 DIAGNOSIS — K5730 Diverticulosis of large intestine without perforation or abscess without bleeding: Secondary | ICD-10-CM | POA: Diagnosis not present

## 2020-12-05 DIAGNOSIS — K5190 Ulcerative colitis, unspecified, without complications: Secondary | ICD-10-CM | POA: Diagnosis not present

## 2020-12-05 DIAGNOSIS — F0390 Unspecified dementia without behavioral disturbance: Secondary | ICD-10-CM | POA: Diagnosis not present

## 2020-12-07 IMAGING — DX PORTABLE CHEST - 1 VIEW
1 series · 1 of 1 positions shown · non-contrast
Comparison: 02/13/2018

CLINICAL DATA: Chest pain centrally since this morning.

EXAM:
PORTABLE CHEST 1 VIEW

[chest ap]
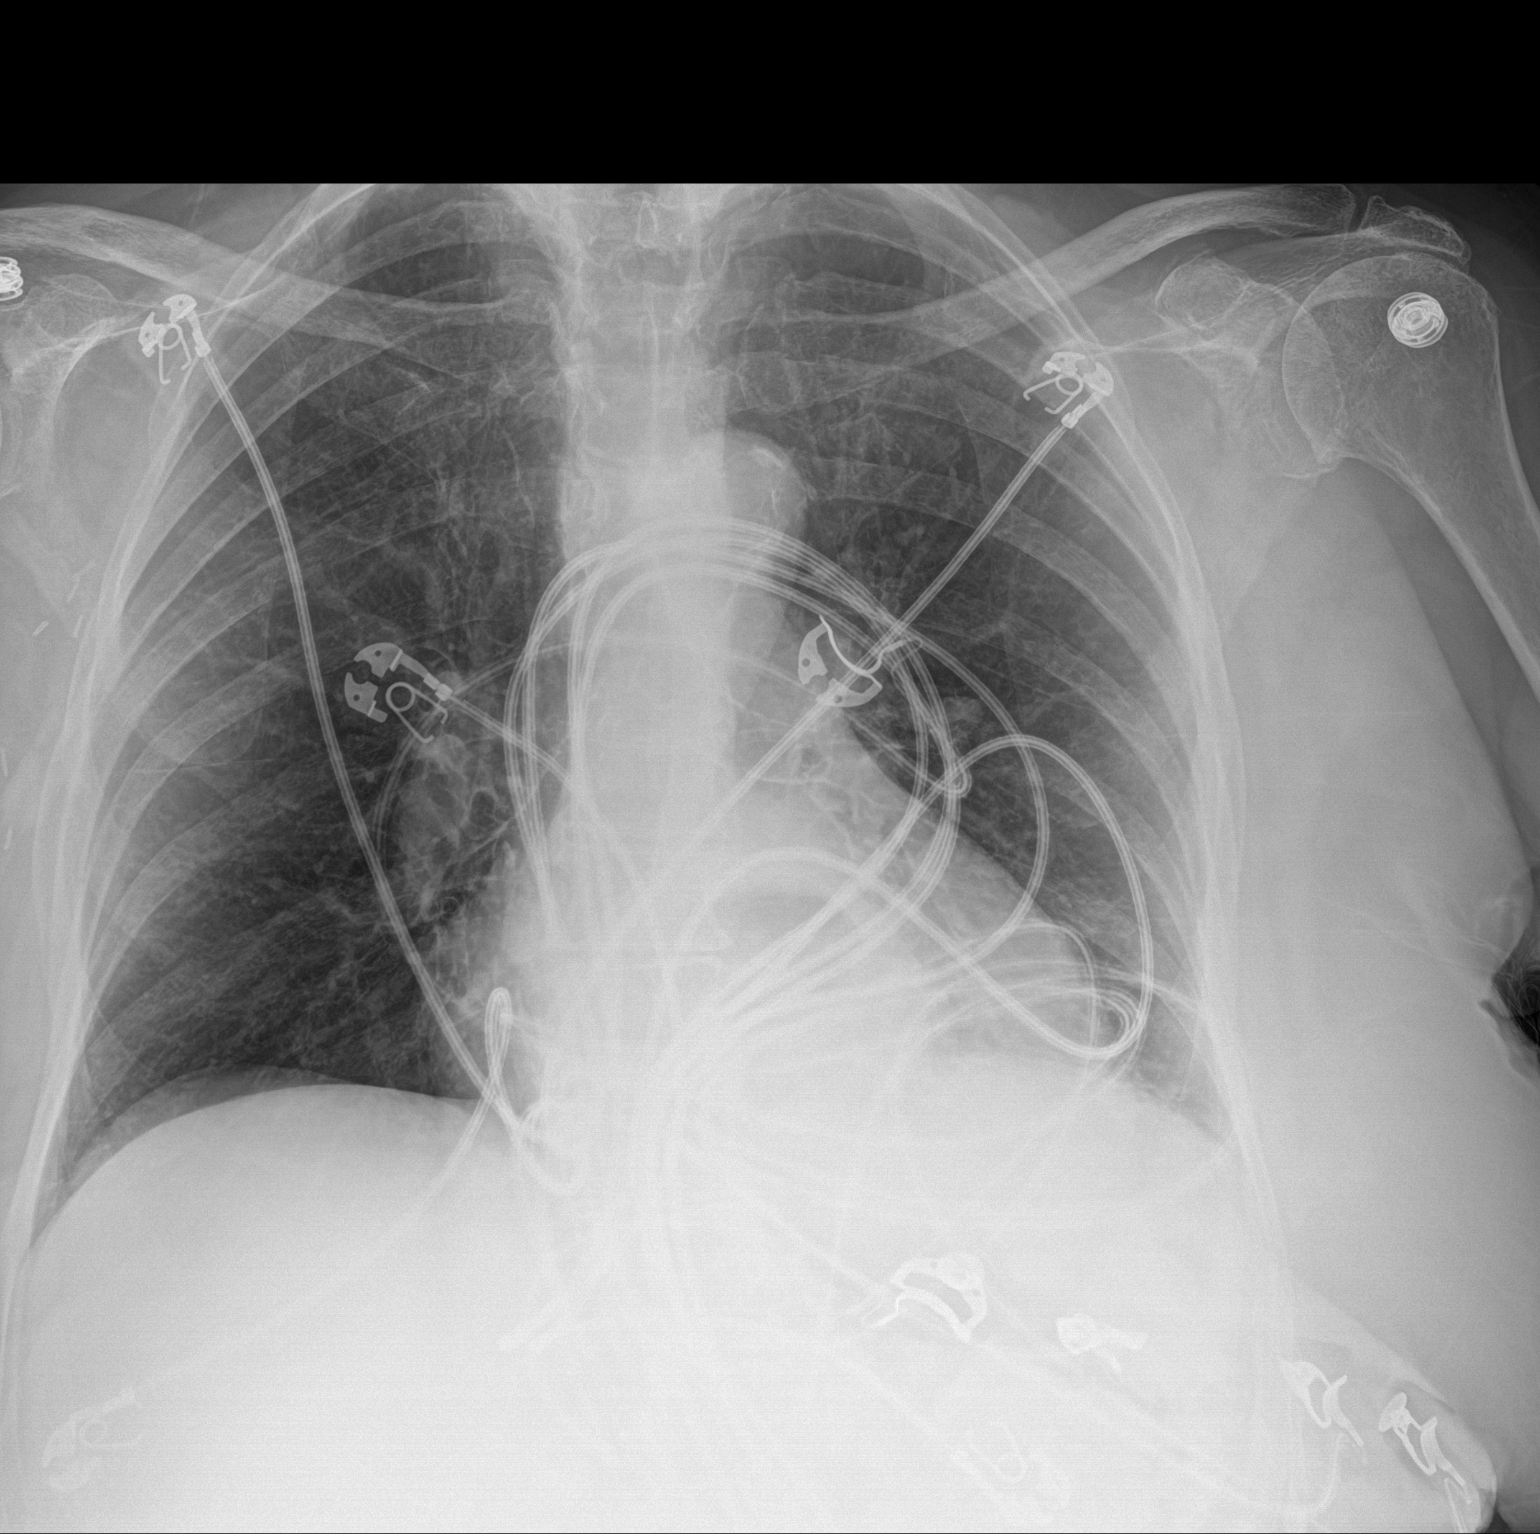

[1 of 1 positions shown; findings below may reference images not displayed]

FINDINGS: Lungs are adequately inflated without consolidation or effusion.
Known moderate size hiatal hernia. Cardiomediastinal silhouette and
remainder of the exam is unchanged.
IMPRESSION: No active disease.

Moderate size hiatal hernia.

## 2020-12-10 DIAGNOSIS — K219 Gastro-esophageal reflux disease without esophagitis: Secondary | ICD-10-CM | POA: Diagnosis not present

## 2020-12-10 DIAGNOSIS — K5190 Ulcerative colitis, unspecified, without complications: Secondary | ICD-10-CM | POA: Diagnosis not present

## 2020-12-10 DIAGNOSIS — U071 COVID-19: Secondary | ICD-10-CM | POA: Diagnosis not present

## 2020-12-10 DIAGNOSIS — I482 Chronic atrial fibrillation, unspecified: Secondary | ICD-10-CM | POA: Diagnosis not present

## 2020-12-10 DIAGNOSIS — K5730 Diverticulosis of large intestine without perforation or abscess without bleeding: Secondary | ICD-10-CM | POA: Diagnosis not present

## 2020-12-10 DIAGNOSIS — F0390 Unspecified dementia without behavioral disturbance: Secondary | ICD-10-CM | POA: Diagnosis not present

## 2020-12-10 DIAGNOSIS — R0602 Shortness of breath: Secondary | ICD-10-CM | POA: Diagnosis not present

## 2020-12-10 DIAGNOSIS — K8000 Calculus of gallbladder with acute cholecystitis without obstruction: Secondary | ICD-10-CM | POA: Diagnosis not present

## 2020-12-10 DIAGNOSIS — K5732 Diverticulitis of large intestine without perforation or abscess without bleeding: Secondary | ICD-10-CM | POA: Diagnosis not present

## 2020-12-11 ENCOUNTER — Other Ambulatory Visit: Payer: Medicare HMO

## 2020-12-11 ENCOUNTER — Other Ambulatory Visit: Payer: Self-pay

## 2020-12-11 DIAGNOSIS — Z515 Encounter for palliative care: Secondary | ICD-10-CM

## 2020-12-11 NOTE — Progress Notes (Signed)
PATIENT NAME: [name] DOB: May 21, 1925 MRN: [id number]  PRIMARY CARE PROVIDER: Mayra Neer, MD  RESPONSIBLE PARTY:  Acct ID - Guarantor Home Phone Work Phone Relationship Acct Type  [id number] [name], [name] [phone] Self P/F     [address], [place], [place] [address]  COMMUNITY PALLIATIVE CARE RN NOTE    PLAN OF CARE and INTERVENTION:  ADVANCE CARE PLANNING/GOALS OF CARE: Goal is to avoid SNF and maintain comfort/function. PATIENT/CAREGIVER EDUCATION: Palliative Care services. Fall precautions. DISEASE STATUS: RN Palliative care telephonic visit completed. Spoke with patient's daughter, [name]. [name] shared that patient has had multiple falls and frequent ED visits. Patient was recently taken off Eliquis and was started on baby ASA. Patient recently had Covid but has recovered. [name] is currently providing PT and OT. Patient is eating well, and daughter stated that patient's weight has not changed. No new concerns verbalized.   HISTORY OF PRESENT ILLNESS: This is a 87 year old female residing at home with her daughter and her son in law. Patient has diagnosis including but not limited to dementia, a-fib with rapid ventricular response, breast cancer (remission) chronic pain, and depression. Palliative care will continue to support patient and family.  CODE STATUS: DNR MOST FORM: Yes- limited interventions. Yes, to IVF and Yes to Antibiotics.  PPS: 40%    PHYSICAL EXAM: Deferred     Makayla Moras, RN

## 2020-12-12 DIAGNOSIS — U071 COVID-19: Secondary | ICD-10-CM | POA: Diagnosis not present

## 2020-12-12 DIAGNOSIS — F0390 Unspecified dementia without behavioral disturbance: Secondary | ICD-10-CM | POA: Diagnosis not present

## 2020-12-12 DIAGNOSIS — K5732 Diverticulitis of large intestine without perforation or abscess without bleeding: Secondary | ICD-10-CM | POA: Diagnosis not present

## 2020-12-12 DIAGNOSIS — R0602 Shortness of breath: Secondary | ICD-10-CM | POA: Diagnosis not present

## 2020-12-12 DIAGNOSIS — K8000 Calculus of gallbladder with acute cholecystitis without obstruction: Secondary | ICD-10-CM | POA: Diagnosis not present

## 2020-12-12 DIAGNOSIS — K219 Gastro-esophageal reflux disease without esophagitis: Secondary | ICD-10-CM | POA: Diagnosis not present

## 2020-12-12 DIAGNOSIS — K5730 Diverticulosis of large intestine without perforation or abscess without bleeding: Secondary | ICD-10-CM | POA: Diagnosis not present

## 2020-12-12 DIAGNOSIS — I482 Chronic atrial fibrillation, unspecified: Secondary | ICD-10-CM | POA: Diagnosis not present

## 2020-12-12 DIAGNOSIS — K5190 Ulcerative colitis, unspecified, without complications: Secondary | ICD-10-CM | POA: Diagnosis not present

## 2020-12-17 DIAGNOSIS — K219 Gastro-esophageal reflux disease without esophagitis: Secondary | ICD-10-CM | POA: Diagnosis not present

## 2020-12-17 DIAGNOSIS — F0390 Unspecified dementia without behavioral disturbance: Secondary | ICD-10-CM | POA: Diagnosis not present

## 2020-12-17 DIAGNOSIS — K5190 Ulcerative colitis, unspecified, without complications: Secondary | ICD-10-CM | POA: Diagnosis not present

## 2020-12-17 DIAGNOSIS — K5732 Diverticulitis of large intestine without perforation or abscess without bleeding: Secondary | ICD-10-CM | POA: Diagnosis not present

## 2020-12-17 DIAGNOSIS — R0602 Shortness of breath: Secondary | ICD-10-CM | POA: Diagnosis not present

## 2020-12-17 DIAGNOSIS — U071 COVID-19: Secondary | ICD-10-CM | POA: Diagnosis not present

## 2020-12-17 DIAGNOSIS — K8000 Calculus of gallbladder with acute cholecystitis without obstruction: Secondary | ICD-10-CM | POA: Diagnosis not present

## 2020-12-17 DIAGNOSIS — K5730 Diverticulosis of large intestine without perforation or abscess without bleeding: Secondary | ICD-10-CM | POA: Diagnosis not present

## 2020-12-17 DIAGNOSIS — I482 Chronic atrial fibrillation, unspecified: Secondary | ICD-10-CM | POA: Diagnosis not present

## 2020-12-22 IMAGING — DX RIGHT SHOULDER - 2+ VIEW
2 series · 2 of 2 positions shown · non-contrast
Comparison: None.

CLINICAL DATA: [AGE] with right shoulder bruising. Fall.
Confusion.

EXAM:
RIGHT SHOULDER - 2+ VIEW

[shoulder grashey]
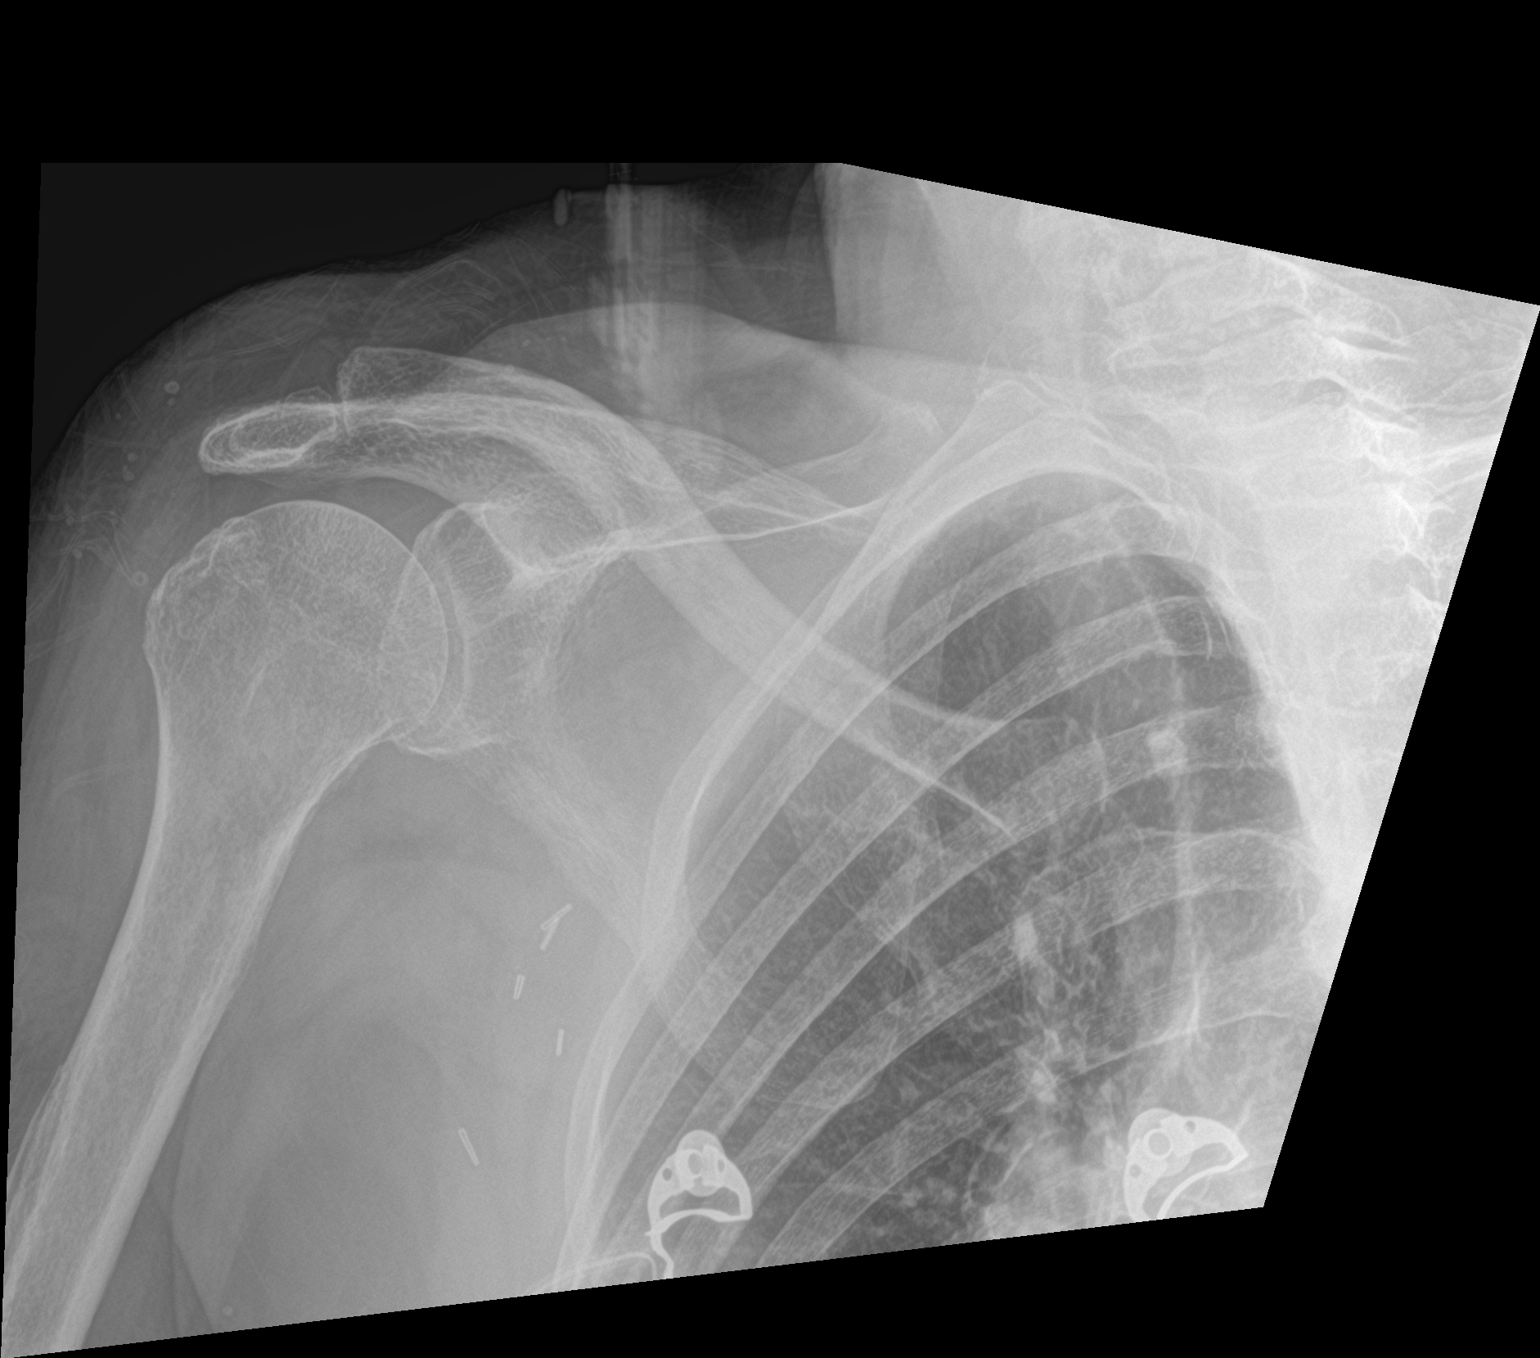

[shoulder y view]
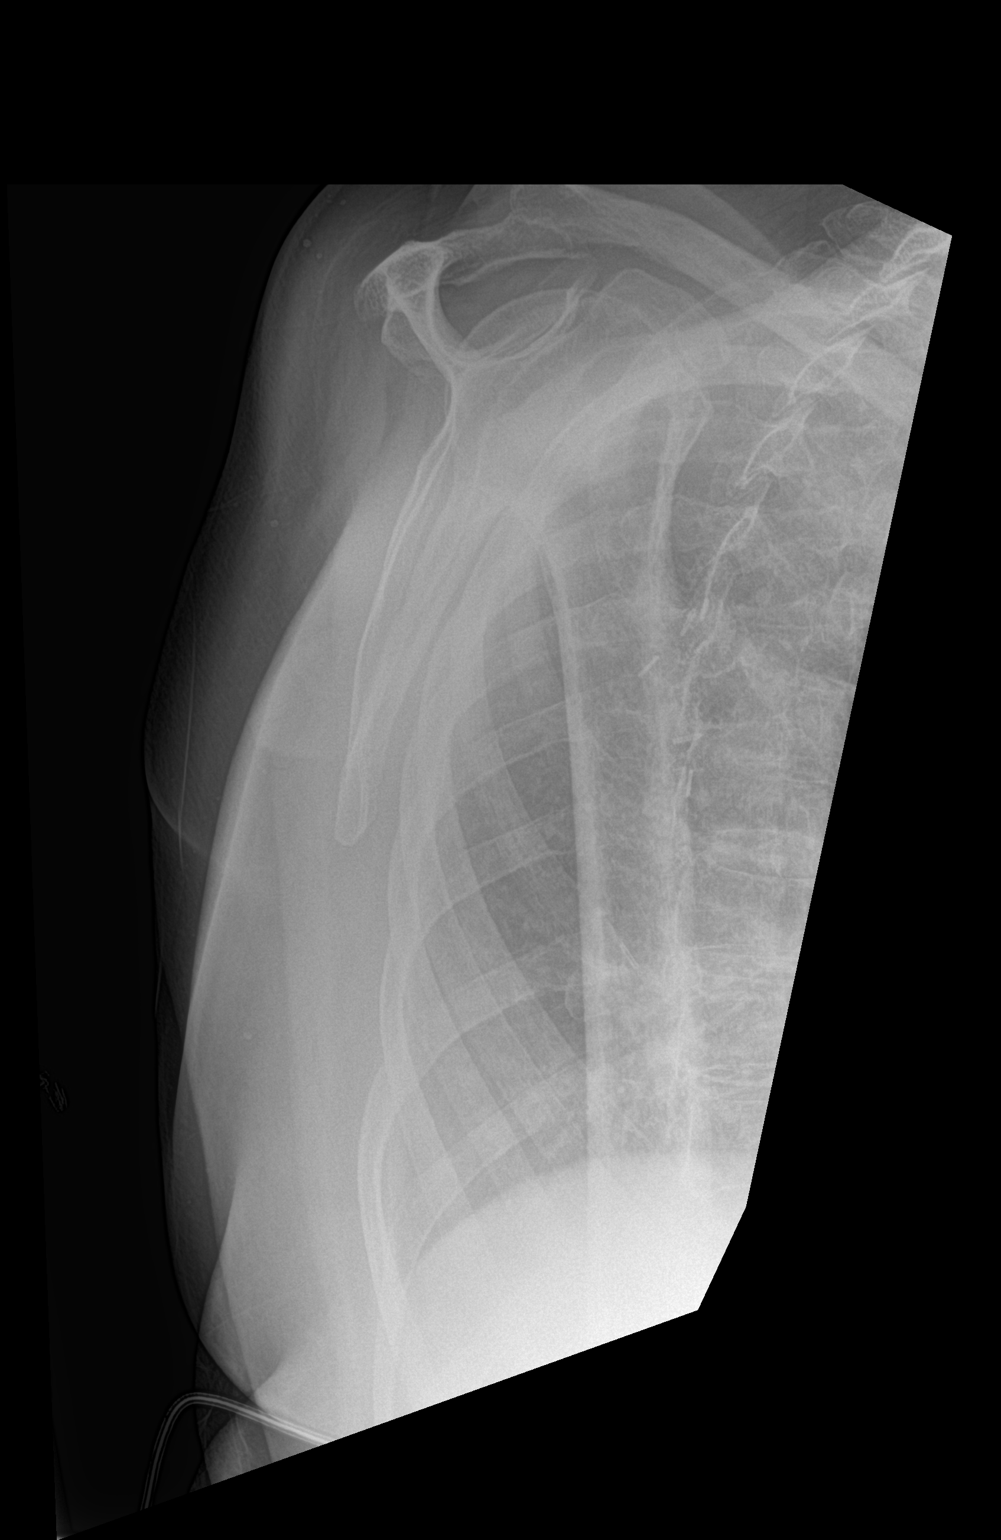

[2 of 2 positions shown; findings below may reference images not displayed]

FINDINGS: There is no evidence of fracture or dislocation. There is no
evidence of arthropathy or other focal bone abnormality. Mild soft
tissue prominence in the supraclavicular region may be soft tissue
overlap 4 hematoma/bruising.
IMPRESSION: No fracture or dislocation of the right shoulder.

## 2020-12-22 IMAGING — CT CT HEAD WITHOUT CONTRAST
4 series · 16 of 47 positions shown, 18 images · non-contrast
Comparison: None.

CLINICAL DATA: Found down, fall, confusion

EXAM:
CT HEAD WITHOUT CONTRAST
CT CERVICAL SPINE WITHOUT CONTRAST
TECHNIQUE: Multidetector CT imaging of the head and cervical spine was
performed following the standard protocol without intravenous
contrast. Multiplanar CT image reconstructions of the cervical spine
were also generated.

[Series 3: head wo · axial · 0.40mm/px · z∈[-118,+2]mm · 7 of 34 slices shown, 9 images]
[im 5/34  brain]
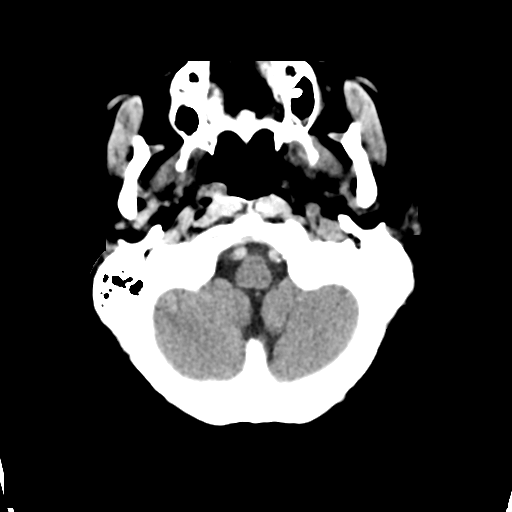
[im 5/34  bone]
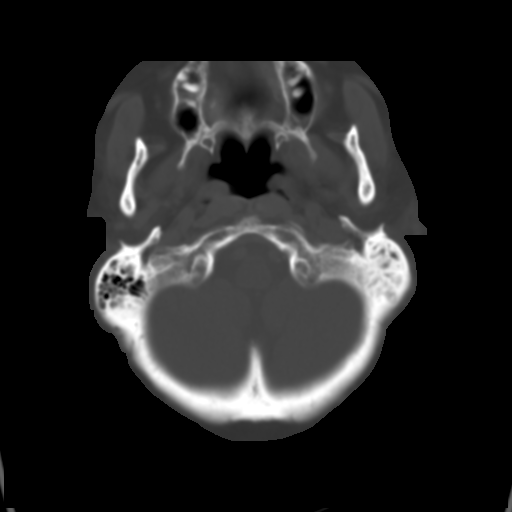
[im 9/34  brain]
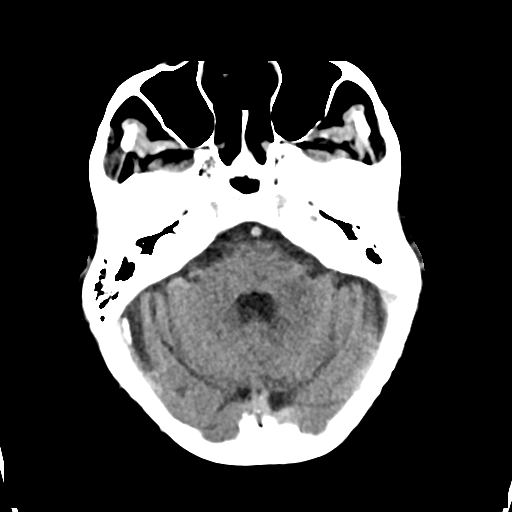
[im 13/34  brain]
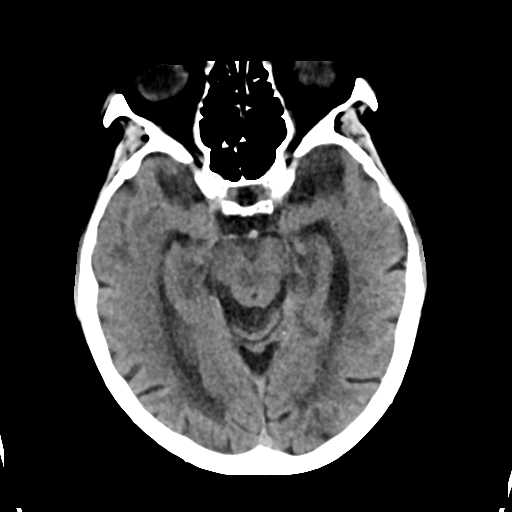
[im 17/34  brain]
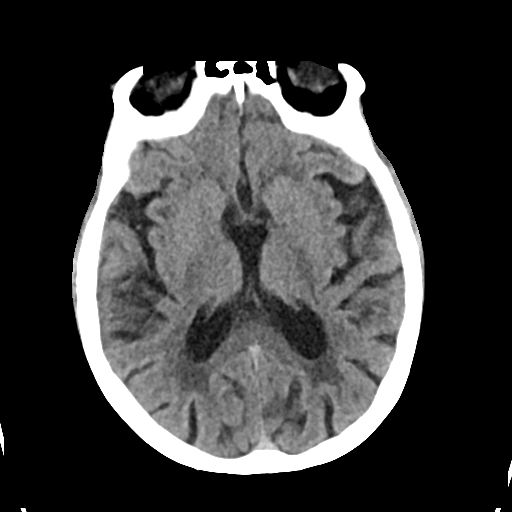
[im 21/34  brain]
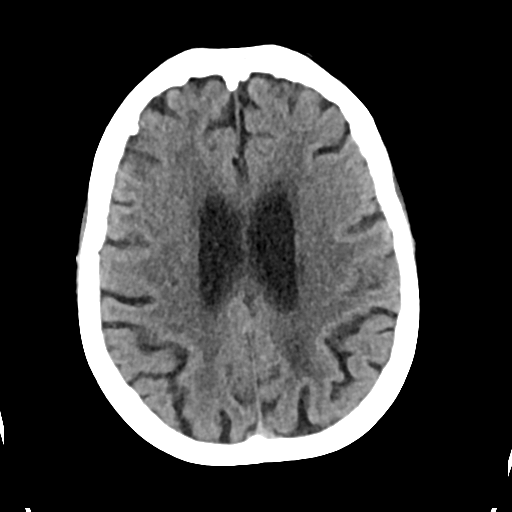
[im 21/34  bone]
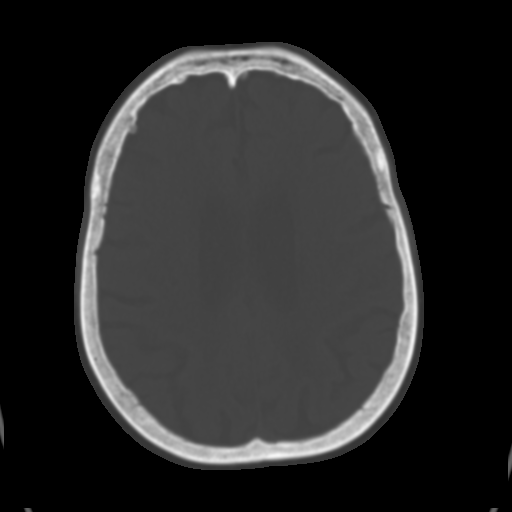
[im 25/34  brain]
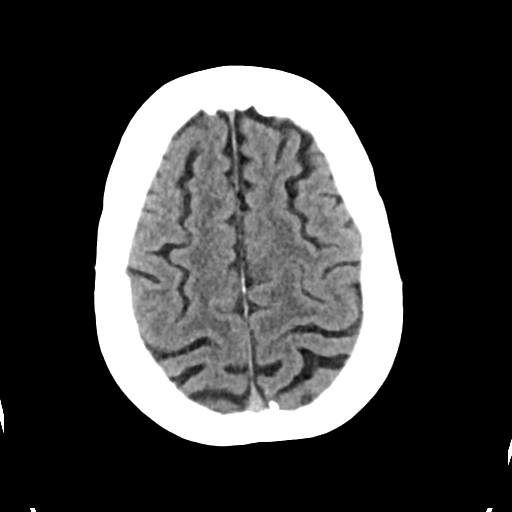
[im 29/34  brain]
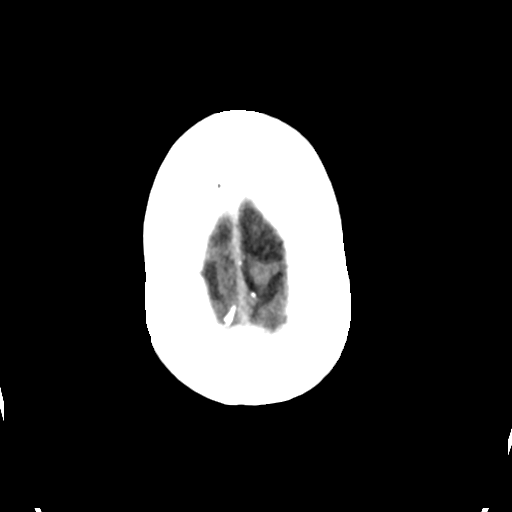

[Series 4: head bone · axial · 0.40mm/px · z∈[-122,-88]mm · 3 of 85 slices shown]
[im 9/85  bone]
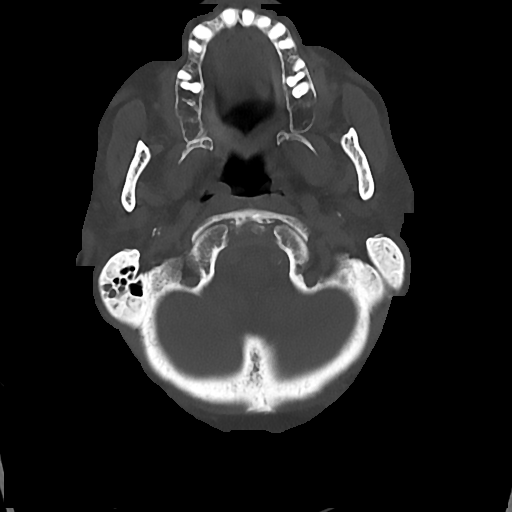
[im 17/85  bone]
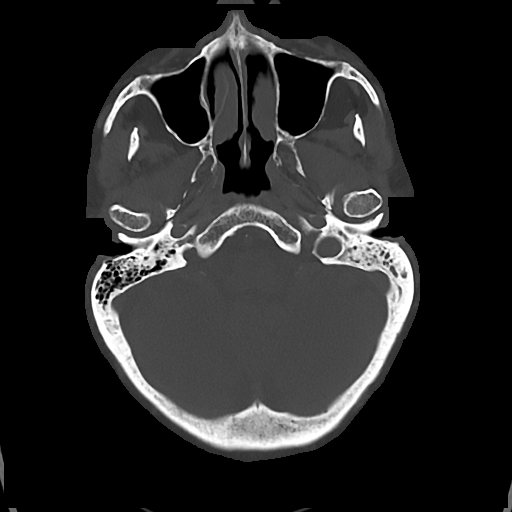
[im 26/85  bone]
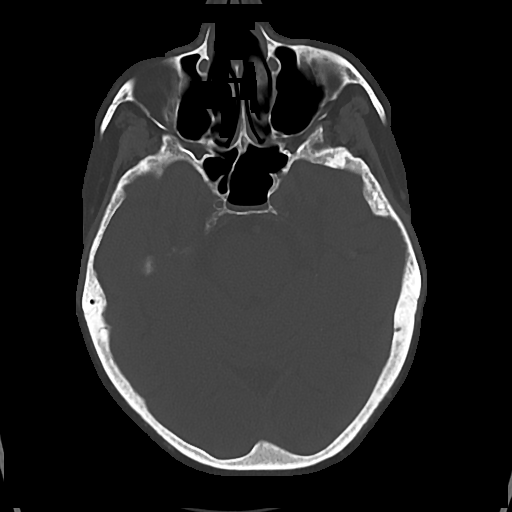

[Series 5: cor soft · coronal · 0.33mm/px · 3 of 67 slices shown]
[im 23/67  brain]
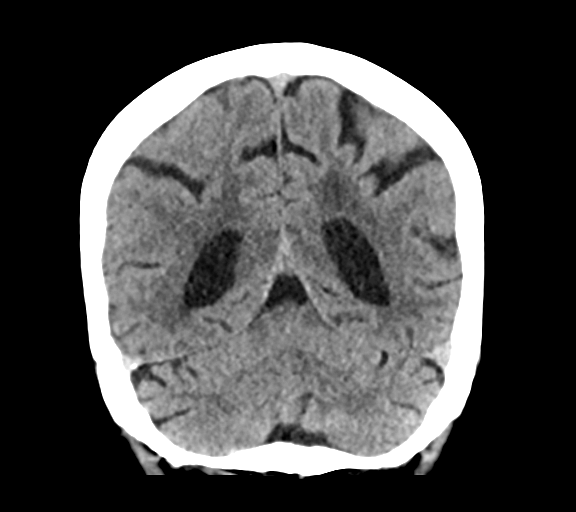
[im 30/67  brain]
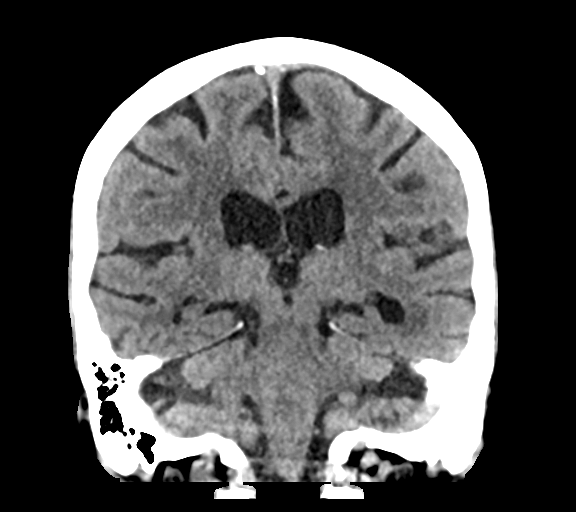
[im 37/67  brain]
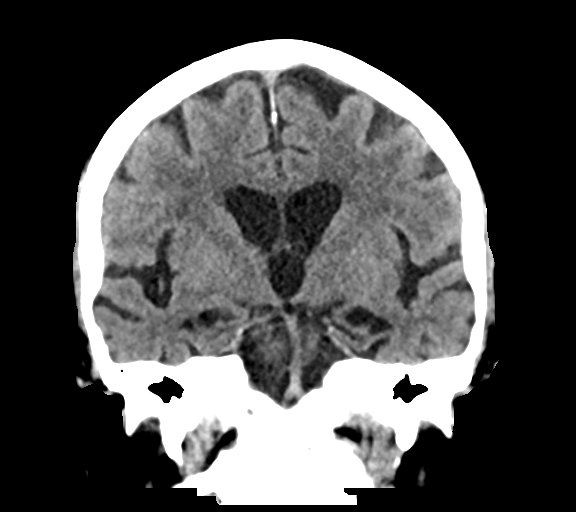

[Series 6: sag soft · sagittal · 0.33mm/px · 3 of 62 slices shown]
[im 21/62  brain]
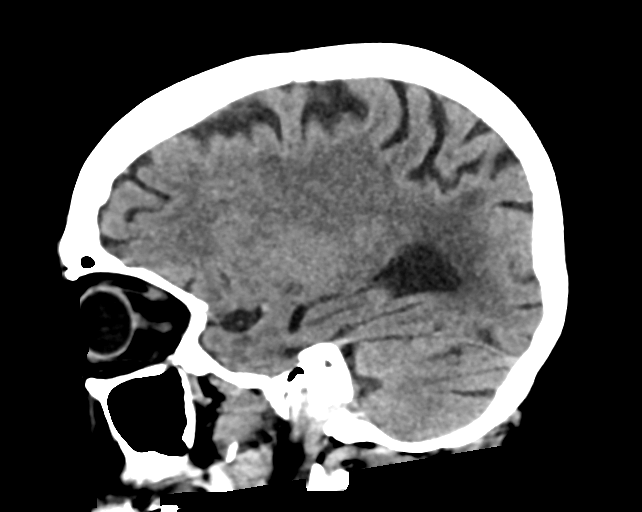
[im 31/62  brain]
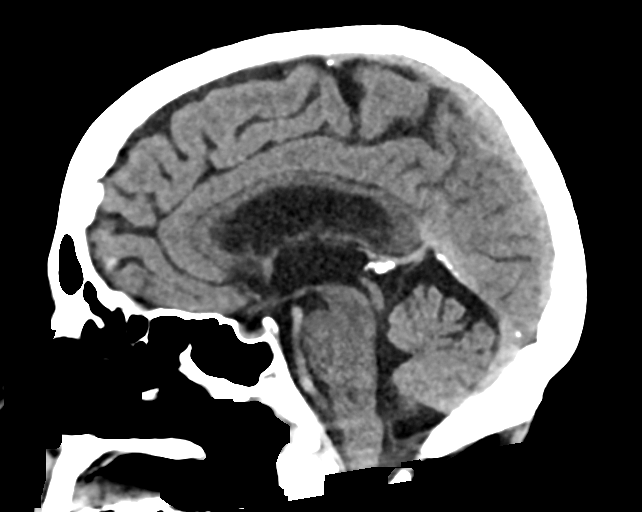
[im 41/62  brain]
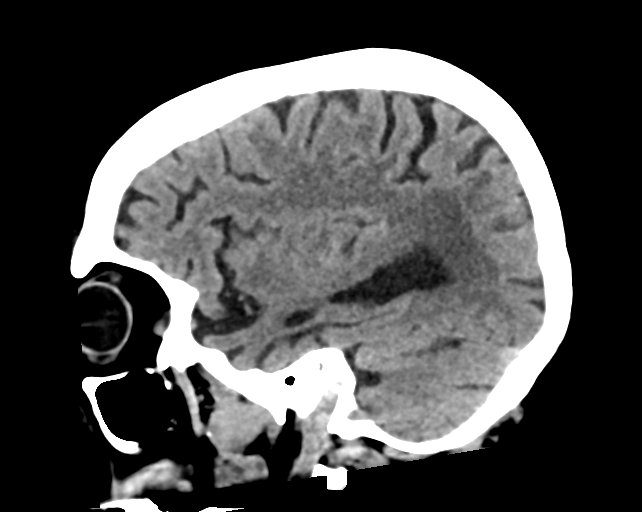

[16 of 47 positions shown; findings below may reference images not displayed]

FINDINGS: CT HEAD FINDINGS

Brain: No evidence of acute infarction, hemorrhage, hydrocephalus,
extra-axial collection or mass lesion/mass effect. Periventricular
white matter hypodensity.

Vascular: No hyperdense vessel or unexpected calcification.

Skull: Normal. Negative for fracture or focal lesion.

Sinuses/Orbits: No acute finding.

Other: None.

CT CERVICAL SPINE FINDINGS

Alignment: Normal.

Skull base and vertebrae: No acute fracture. No primary bone lesion
or focal pathologic process.

Soft tissues and spinal canal: No prevertebral fluid or swelling. No
visible canal hematoma.

Disc levels: Moderate multilevel disc space height loss and
osteophytosis.

Upper chest: Negative.

Other: None.
IMPRESSION: 1. No acute intracranial pathology. Small-vessel white matter
disease.

2. No fracture or static subluxation of the cervical spine. Moderate
multilevel disc degenerative disease and osteophytosis.

## 2020-12-22 IMAGING — DX CHEST - 2 VIEW
2 series · 2 of 2 positions shown · non-contrast
Comparison: Radiograph 10/02/2018

CLINICAL DATA: [AGE] with right shoulder bruising. Fall.
Confusion.

EXAM:
CHEST - 2 VIEW

[chest lat]
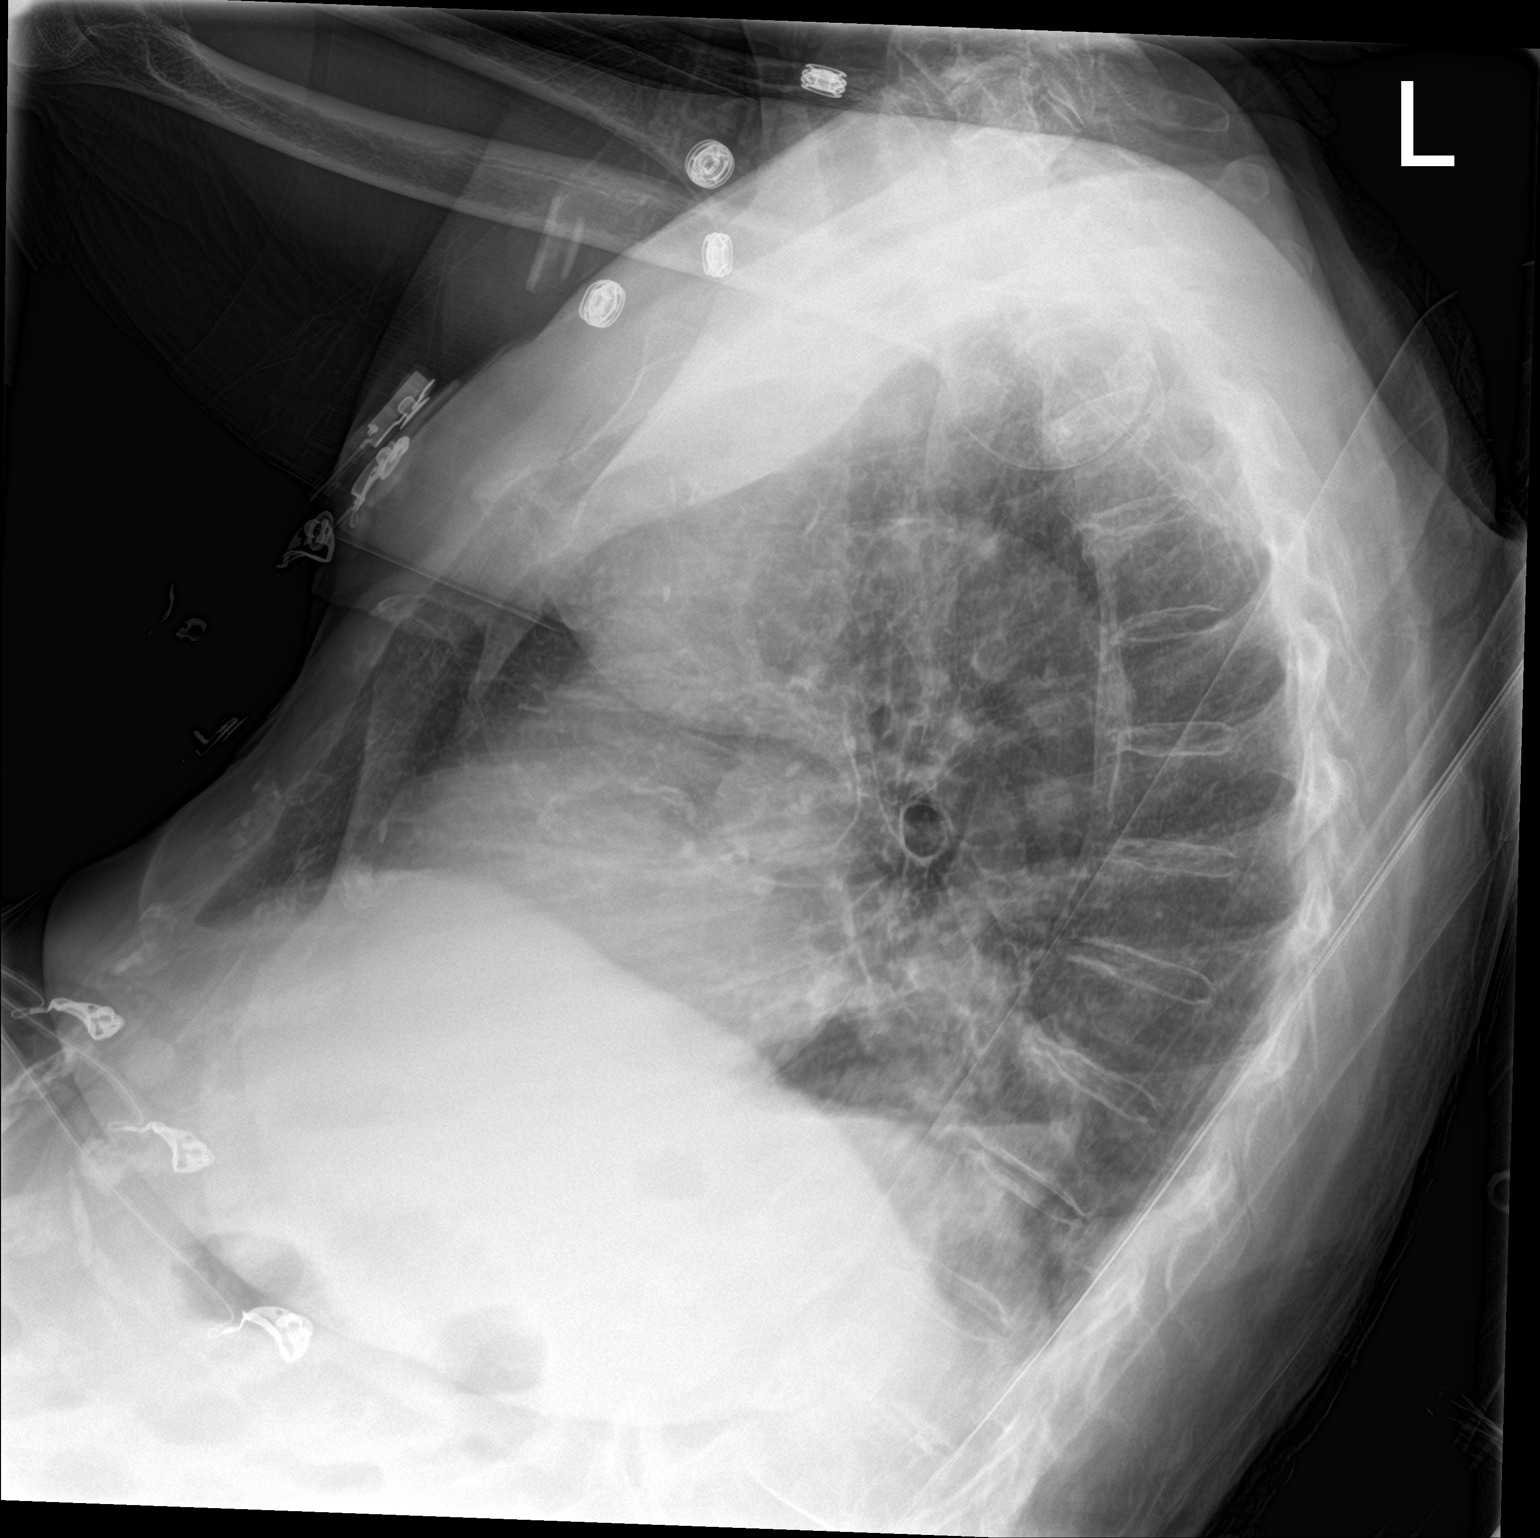

[chest ap]
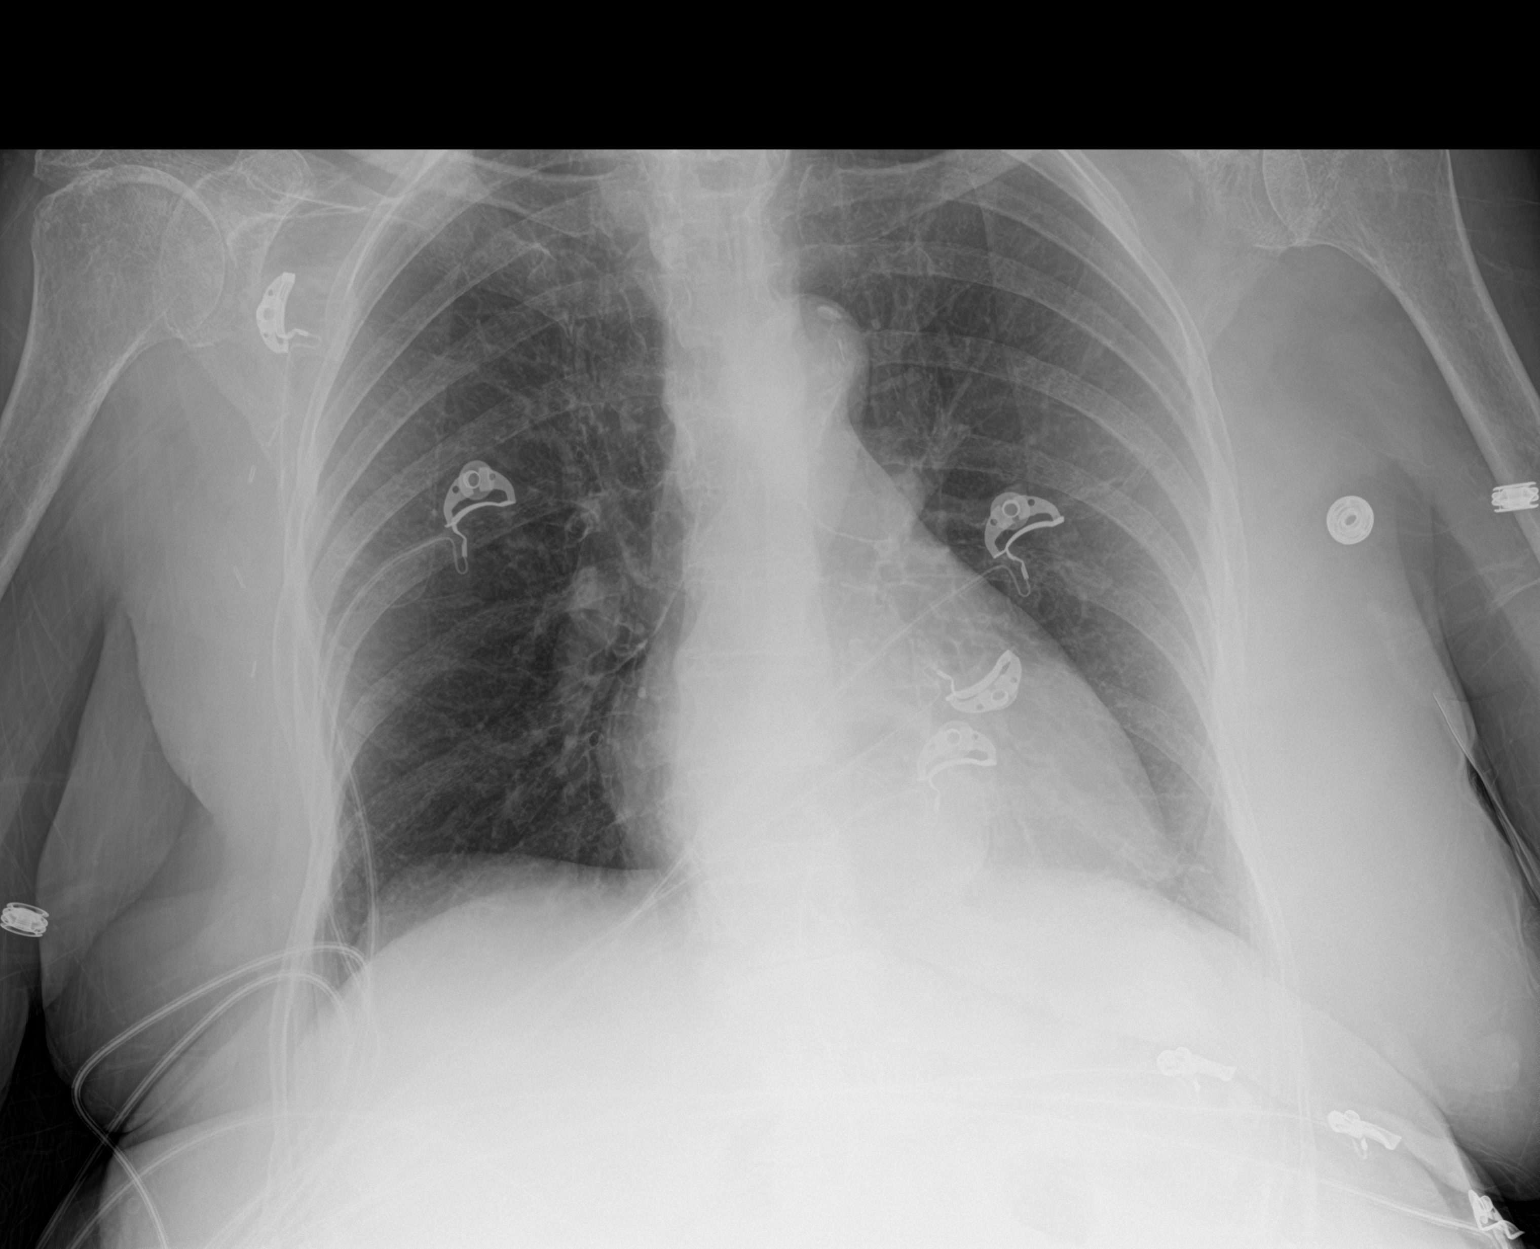

[2 of 2 positions shown; findings below may reference images not displayed]

FINDINGS: The cardiomediastinal contours are unchanged. Moderate retrocardiac
hiatal hernia with air-fluid level. Pulmonary vasculature is normal.
No consolidation, pleural effusion, or pneumothorax. No acute
osseous abnormalities are seen. Surgical clips in the right axilla.
IMPRESSION: 1. No acute chest findings.
2. Moderate hiatal hernia.

## 2020-12-23 DIAGNOSIS — I482 Chronic atrial fibrillation, unspecified: Secondary | ICD-10-CM | POA: Diagnosis not present

## 2020-12-23 DIAGNOSIS — U071 COVID-19: Secondary | ICD-10-CM | POA: Diagnosis not present

## 2020-12-23 DIAGNOSIS — F0390 Unspecified dementia without behavioral disturbance: Secondary | ICD-10-CM | POA: Diagnosis not present

## 2020-12-23 DIAGNOSIS — K8000 Calculus of gallbladder with acute cholecystitis without obstruction: Secondary | ICD-10-CM | POA: Diagnosis not present

## 2020-12-23 DIAGNOSIS — K219 Gastro-esophageal reflux disease without esophagitis: Secondary | ICD-10-CM | POA: Diagnosis not present

## 2020-12-23 DIAGNOSIS — K5730 Diverticulosis of large intestine without perforation or abscess without bleeding: Secondary | ICD-10-CM | POA: Diagnosis not present

## 2020-12-23 DIAGNOSIS — R0602 Shortness of breath: Secondary | ICD-10-CM | POA: Diagnosis not present

## 2020-12-23 DIAGNOSIS — K5732 Diverticulitis of large intestine without perforation or abscess without bleeding: Secondary | ICD-10-CM | POA: Diagnosis not present

## 2020-12-23 DIAGNOSIS — K5190 Ulcerative colitis, unspecified, without complications: Secondary | ICD-10-CM | POA: Diagnosis not present

## 2020-12-25 DIAGNOSIS — K5732 Diverticulitis of large intestine without perforation or abscess without bleeding: Secondary | ICD-10-CM | POA: Diagnosis not present

## 2020-12-25 DIAGNOSIS — K5190 Ulcerative colitis, unspecified, without complications: Secondary | ICD-10-CM | POA: Diagnosis not present

## 2020-12-25 DIAGNOSIS — R0602 Shortness of breath: Secondary | ICD-10-CM | POA: Diagnosis not present

## 2020-12-25 DIAGNOSIS — K219 Gastro-esophageal reflux disease without esophagitis: Secondary | ICD-10-CM | POA: Diagnosis not present

## 2020-12-25 DIAGNOSIS — K5730 Diverticulosis of large intestine without perforation or abscess without bleeding: Secondary | ICD-10-CM | POA: Diagnosis not present

## 2020-12-25 DIAGNOSIS — I482 Chronic atrial fibrillation, unspecified: Secondary | ICD-10-CM | POA: Diagnosis not present

## 2020-12-25 DIAGNOSIS — F0390 Unspecified dementia without behavioral disturbance: Secondary | ICD-10-CM | POA: Diagnosis not present

## 2020-12-25 DIAGNOSIS — U071 COVID-19: Secondary | ICD-10-CM | POA: Diagnosis not present

## 2020-12-25 DIAGNOSIS — K8000 Calculus of gallbladder with acute cholecystitis without obstruction: Secondary | ICD-10-CM | POA: Diagnosis not present

## 2020-12-27 DIAGNOSIS — F0390 Unspecified dementia without behavioral disturbance: Secondary | ICD-10-CM | POA: Diagnosis not present

## 2020-12-27 DIAGNOSIS — K5730 Diverticulosis of large intestine without perforation or abscess without bleeding: Secondary | ICD-10-CM | POA: Diagnosis not present

## 2020-12-27 DIAGNOSIS — R0602 Shortness of breath: Secondary | ICD-10-CM | POA: Diagnosis not present

## 2020-12-27 DIAGNOSIS — U071 COVID-19: Secondary | ICD-10-CM | POA: Diagnosis not present

## 2020-12-27 DIAGNOSIS — K219 Gastro-esophageal reflux disease without esophagitis: Secondary | ICD-10-CM | POA: Diagnosis not present

## 2020-12-27 DIAGNOSIS — K5732 Diverticulitis of large intestine without perforation or abscess without bleeding: Secondary | ICD-10-CM | POA: Diagnosis not present

## 2020-12-27 DIAGNOSIS — K5190 Ulcerative colitis, unspecified, without complications: Secondary | ICD-10-CM | POA: Diagnosis not present

## 2020-12-27 DIAGNOSIS — I482 Chronic atrial fibrillation, unspecified: Secondary | ICD-10-CM | POA: Diagnosis not present

## 2020-12-27 DIAGNOSIS — K8000 Calculus of gallbladder with acute cholecystitis without obstruction: Secondary | ICD-10-CM | POA: Diagnosis not present

## 2020-12-30 DIAGNOSIS — R0602 Shortness of breath: Secondary | ICD-10-CM | POA: Diagnosis not present

## 2020-12-30 DIAGNOSIS — K5732 Diverticulitis of large intestine without perforation or abscess without bleeding: Secondary | ICD-10-CM | POA: Diagnosis not present

## 2020-12-30 DIAGNOSIS — I482 Chronic atrial fibrillation, unspecified: Secondary | ICD-10-CM | POA: Diagnosis not present

## 2020-12-30 DIAGNOSIS — K5190 Ulcerative colitis, unspecified, without complications: Secondary | ICD-10-CM | POA: Diagnosis not present

## 2020-12-30 DIAGNOSIS — F0390 Unspecified dementia without behavioral disturbance: Secondary | ICD-10-CM | POA: Diagnosis not present

## 2020-12-30 DIAGNOSIS — K8000 Calculus of gallbladder with acute cholecystitis without obstruction: Secondary | ICD-10-CM | POA: Diagnosis not present

## 2020-12-30 DIAGNOSIS — K219 Gastro-esophageal reflux disease without esophagitis: Secondary | ICD-10-CM | POA: Diagnosis not present

## 2020-12-30 DIAGNOSIS — K5730 Diverticulosis of large intestine without perforation or abscess without bleeding: Secondary | ICD-10-CM | POA: Diagnosis not present

## 2020-12-30 DIAGNOSIS — U071 COVID-19: Secondary | ICD-10-CM | POA: Diagnosis not present

## 2020-12-31 DIAGNOSIS — K5730 Diverticulosis of large intestine without perforation or abscess without bleeding: Secondary | ICD-10-CM | POA: Diagnosis not present

## 2020-12-31 DIAGNOSIS — F0390 Unspecified dementia without behavioral disturbance: Secondary | ICD-10-CM | POA: Diagnosis not present

## 2020-12-31 DIAGNOSIS — I482 Chronic atrial fibrillation, unspecified: Secondary | ICD-10-CM | POA: Diagnosis not present

## 2020-12-31 DIAGNOSIS — K8000 Calculus of gallbladder with acute cholecystitis without obstruction: Secondary | ICD-10-CM | POA: Diagnosis not present

## 2020-12-31 DIAGNOSIS — R0602 Shortness of breath: Secondary | ICD-10-CM | POA: Diagnosis not present

## 2020-12-31 DIAGNOSIS — K5190 Ulcerative colitis, unspecified, without complications: Secondary | ICD-10-CM | POA: Diagnosis not present

## 2020-12-31 DIAGNOSIS — K219 Gastro-esophageal reflux disease without esophagitis: Secondary | ICD-10-CM | POA: Diagnosis not present

## 2020-12-31 DIAGNOSIS — K5732 Diverticulitis of large intestine without perforation or abscess without bleeding: Secondary | ICD-10-CM | POA: Diagnosis not present

## 2020-12-31 DIAGNOSIS — U071 COVID-19: Secondary | ICD-10-CM | POA: Diagnosis not present

## 2021-03-10 DIAGNOSIS — M109 Gout, unspecified: Secondary | ICD-10-CM | POA: Diagnosis not present

## 2021-03-10 DIAGNOSIS — I4891 Unspecified atrial fibrillation: Secondary | ICD-10-CM | POA: Diagnosis not present

## 2021-03-10 DIAGNOSIS — F0390 Unspecified dementia without behavioral disturbance: Secondary | ICD-10-CM | POA: Diagnosis not present

## 2021-03-10 DIAGNOSIS — E46 Unspecified protein-calorie malnutrition: Secondary | ICD-10-CM | POA: Diagnosis not present

## 2021-03-10 DIAGNOSIS — E1169 Type 2 diabetes mellitus with other specified complication: Secondary | ICD-10-CM | POA: Diagnosis not present

## 2021-03-10 DIAGNOSIS — D692 Other nonthrombocytopenic purpura: Secondary | ICD-10-CM | POA: Diagnosis not present

## 2021-03-10 DIAGNOSIS — Z23 Encounter for immunization: Secondary | ICD-10-CM | POA: Diagnosis not present

## 2021-03-10 DIAGNOSIS — I10 Essential (primary) hypertension: Secondary | ICD-10-CM | POA: Diagnosis not present

## 2021-06-11 DEATH — deceased
